# Patient Record
Sex: Female | Born: 1985 | Race: Black or African American | Hispanic: No | Marital: Single | Smoking: Never smoker
Health system: Southern US, Community
[De-identification: ages and names within clinical notes are randomized; demographics above are authoritative.]

## PROBLEM LIST (undated history)

## (undated) ENCOUNTER — Inpatient Hospital Stay (HOSPITAL_COMMUNITY): Payer: Self-pay

## (undated) DIAGNOSIS — F419 Anxiety disorder, unspecified: Secondary | ICD-10-CM

## (undated) DIAGNOSIS — Z789 Other specified health status: Secondary | ICD-10-CM

## (undated) DIAGNOSIS — R739 Hyperglycemia, unspecified: Secondary | ICD-10-CM

## (undated) DIAGNOSIS — M3214 Glomerular disease in systemic lupus erythematosus: Secondary | ICD-10-CM

## (undated) DIAGNOSIS — M329 Systemic lupus erythematosus, unspecified: Secondary | ICD-10-CM

## (undated) DIAGNOSIS — D689 Coagulation defect, unspecified: Secondary | ICD-10-CM

## (undated) DIAGNOSIS — N200 Calculus of kidney: Secondary | ICD-10-CM

## (undated) DIAGNOSIS — Z5189 Encounter for other specified aftercare: Secondary | ICD-10-CM

## (undated) DIAGNOSIS — J45909 Unspecified asthma, uncomplicated: Secondary | ICD-10-CM

## (undated) DIAGNOSIS — N83209 Unspecified ovarian cyst, unspecified side: Secondary | ICD-10-CM

## (undated) DIAGNOSIS — F3281 Premenstrual dysphoric disorder: Secondary | ICD-10-CM

## (undated) DIAGNOSIS — T7840XA Allergy, unspecified, initial encounter: Secondary | ICD-10-CM

## (undated) DIAGNOSIS — IMO0002 Reserved for concepts with insufficient information to code with codable children: Secondary | ICD-10-CM

## (undated) HISTORY — PX: PILONIDAL CYST DRAINAGE: SHX743

## (undated) HISTORY — DX: Premenstrual dysphoric disorder: F32.81

## (undated) HISTORY — DX: Encounter for other specified aftercare: Z51.89

## (undated) HISTORY — DX: Allergy, unspecified, initial encounter: T78.40XA

## (undated) HISTORY — DX: Anxiety disorder, unspecified: F41.9

## (undated) HISTORY — DX: Coagulation defect, unspecified: D68.9

---
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
## 2010-07-10 ENCOUNTER — Emergency Department (HOSPITAL_COMMUNITY): Admission: EM | Admit: 2010-07-10 | Discharge: 2010-07-10 | Payer: Self-pay | Admitting: Emergency Medicine

## 2010-11-10 LAB — URINALYSIS, ROUTINE W REFLEX MICROSCOPIC
Glucose, UA: NEGATIVE mg/dL
Hgb urine dipstick: NEGATIVE
Specific Gravity, Urine: 1.025 (ref 1.005–1.030)

## 2010-11-10 LAB — POCT PREGNANCY, URINE: Preg Test, Ur: NEGATIVE

## 2011-06-30 ENCOUNTER — Other Ambulatory Visit: Payer: Self-pay

## 2011-06-30 ENCOUNTER — Other Ambulatory Visit (HOSPITAL_COMMUNITY): Payer: Self-pay | Admitting: Obstetrics and Gynecology

## 2011-06-30 DIAGNOSIS — O2690 Pregnancy related conditions, unspecified, unspecified trimester: Secondary | ICD-10-CM

## 2011-07-06 ENCOUNTER — Encounter (HOSPITAL_COMMUNITY): Payer: Self-pay

## 2011-07-06 ENCOUNTER — Ambulatory Visit (HOSPITAL_COMMUNITY)
Admission: RE | Admit: 2011-07-06 | Discharge: 2011-07-06 | Disposition: A | Discharge status: Home / Self Care | Payer: Medicaid Other | Source: Ambulatory Visit | Attending: Obstetrics and Gynecology | Admitting: Obstetrics and Gynecology

## 2011-07-06 DIAGNOSIS — Z8279 Family history of other congenital malformations, deformations and chromosomal abnormalities: Secondary | ICD-10-CM

## 2011-07-06 DIAGNOSIS — Z1389 Encounter for screening for other disorder: Secondary | ICD-10-CM | POA: Insufficient documentation

## 2011-07-06 DIAGNOSIS — O358XX0 Maternal care for other (suspected) fetal abnormality and damage, not applicable or unspecified: Secondary | ICD-10-CM | POA: Insufficient documentation

## 2011-07-06 DIAGNOSIS — Z363 Encounter for antenatal screening for malformations: Secondary | ICD-10-CM | POA: Insufficient documentation

## 2011-07-06 DIAGNOSIS — O34219 Maternal care for unspecified type scar from previous cesarean delivery: Secondary | ICD-10-CM | POA: Insufficient documentation

## 2011-07-06 DIAGNOSIS — O2690 Pregnancy related conditions, unspecified, unspecified trimester: Secondary | ICD-10-CM

## 2011-07-06 HISTORY — DX: Systemic lupus erythematosus, unspecified: M32.9

## 2011-07-06 HISTORY — DX: Reserved for concepts with insufficient information to code with codable children: IMO0002

## 2011-07-06 NOTE — Progress Notes (Signed)
Ultrasound in [facility]/EP[facility].  Follow up U/S scheduled 

## 2011-07-08 NOTE — Progress Notes (Signed)
Genetic Counseling  High-Risk Gestation Note  Appointment Date:  07/06/11 Referred By: Juanetta Snow., MD Date of Birth:  1986-01-16   Pregnancy History: [id number] Estimated Date of Delivery: 11/25/11 Estimated Gestational Age: [redacted]w[redacted]d Attending: Dario Ave, MD  Ms. Trudie Cervantes was seen for genetic counseling because of a family history of spina bifida.  Both family histories were reviewed and found to be contributory for spina bifida. Ms. Fanguy reported that her daughter, currently age 23 years, had recurrent urinary tract infections and was subsequently diagnosed with closed spina bifida described as 3 unfused vertebra.  She is followed at [facility]. The patient reported that she was told that it is not considered spina bifida occulta given that her daughter has UTI's. Ms. Scovell also reported a maternal half-sister who had open spina bifida and was in a wheelchair. She passed away in adolescence. The patient had limited information regarding the specific cause.   We discussed that spina bifida is a common birth differences and affects approximately 1 in [phone] live births.  We reviewed that the spine contains nerves that control the legs, bladder, and bowel.  If there is an opening or a change in the development of the spine, the nerves can be damaged or incompletely formed resulting in a wide range of health concerns. Neural tube defects (NTDs) occurs as an isolated finding, in the majority of cases, with multifactorial inheritance typically suspected.   Both the genetic and environmental factors that contribute to the development of spina bifida are largely unknown; however, some medications and health conditions, such as uncontrolled diabetes and obesity, may increase the chance of spina bifida.  We also discussed the role of folic acid in the development of the neural tube and prevention of spina bifida. Additionally, NTDs may occur as a feature of an underlying genetic  syndrome or condition.  Approximately 5-10% of individuals who have spina bifida also have an underlying chromosome condition.  We reviewed chromosomes, genes, and various inheritance patterns.  Without additional information, it is difficult to determine a specific etiology.    Spina bifida occulta, or closed spina bifida, often describes an absence of one or two vertebral arches, without a visible lesion, which may be discovered incidentally following an X-ray for backache or other unrelated symptoms. Some forms of spina bifida occulta can cause symptoms, such as the presence of a tethered cord. Limited information exists regarding the prevalence and underlying cause of spina bifida occulta. Studies suggest that in cases of asymptomatic spina bifida occulta, recurrence risk to relatives is not likely increased. In the case of symptomatic spina bifida occulta, recurrence risk for spina bifida may be similar to that of open neural tube defects. Once a couple has one child with a nonsyndromic, multifactorial NTD, the risk of recurrence is estimated to be 3-5%. In the case of a third degree relative to the pregnancy with isolated spina bifida and assuming multifactorial inheritance, recurrence risk is approximately 0.5-1%.  In the case of an underlying condition, recurrence risk depends upon the inheritance of that condition. Current data are not available to quantify precise recurrence risk in the case of multiple relatives with spina bifida.   Further genetic counseling is warranted if more information is obtained regarding these individuals.   Rebecca Day was counseled regarding prenatal screening and diagnostic options for detection of NTDs.  We discussed that screening options adjust a patient's a priori risk and provide a pregnancy specific risk for the condition.  Specifically, we reviewed MSAFP  and ultrasound.  Rebecca Day previously had Quad screening which reduced the risk for open neural tube defects (1  in 5046). However, this screen was not able to incorporate the family history information into the patient's risk assessment. Additionally, Quad screening reduced the risk for Down syndrome to 1 in 9301 and indicated that the risk for Trisomy 18 was not increased above 1 in 4262. Rebecca Day understands that this screen adjusts a priori risk for these conditions to provide a pregnancy risk assessment and is not diagnostic for these conditions. We reviewed that detailed ultrasound detects between 80 and 90% of open NTDs.  In addition, we discussed the option of amniocentesis including the associated risks, benefits, and limitations.  She understands that amniotic fluid levels of AFP and ACHE can detect >99% of ONTDs. Ms. Graham understands that these screening and testing options cannot assess for closed spina bifida prenatally.   Rebecca Day elected to proceed with targeted ultrasound only.   Additionally, Rebecca Day reported a paternal half-niece (her paternal half-sister's daughter) who was born at [redacted] weeks gestation and had a hole in her heart requiring surgical correction. She is otherwise healthy at age 23 years. Congenital heart defects occur in approximately 1% of pregnancies.  Congenital heart defects may occur due to multifactorial influences, chromosomal abnormalities, genetic syndromes or environmental exposures.  Isolated heart defects are generally multifactorial.  Given the reported family history and assuming multifactorial inheritance, the risk for a congenital heart defect in the current pregnancy does not appear to be increased above the general population risk. Without further information regarding the provided family history, an accurate genetic risk cannot be calculated. Further genetic counseling is warranted if more information is obtained.  A complete ultrasound was performed today.  The ultrasound report will be sent under separate cover. An echogenic intracardiac focus was visualized today  on ultrasound. An isolated echogenic focus is generally believed to be a normal variation without any concerns for the pregnancy.  Isolated echogenic cardiac foci are not associated with congenital heart defects in the baby or compromised cardiac function after birth.  However, an echogenic cardiac focus is associated with a slightly increased chance for Down syndrome when seen in a high risk population. Given the patient's age and Quad screen result, the presence of an echogenic intracardiac focus on ultrasound would not be expected to significantly increase the risk for aneuploidy in the current pregnancy.   Rebecca Day was provided with written information regarding sickle cell anemia (SCA) including the carrier frequency and incidence in the African-American population, the availability of carrier testing and prenatal diagnosis if indicated.  In addition, we discussed that hemoglobinopathies are routinely screened for as part of the [place] newborn screening panel.  She declined hemoglobin electrophoresis today stating that she was screened in the past and does not have sickle cell trait.   Rebecca Day denied exposure to environmental toxins or chemical agents. She denied the use of alcohol, tobacco or street drugs. She denied significant viral illnesses during the course of her pregnancy. Her medical and surgical histories were contributory for Lupus, for which she reported she is not taking medication currently.   I counseled Rebecca Day for approximately 45 minutes regarding the above risks and available options.     Clydie Braun Onyx Edgley, MS, [facility] 07/08/2011

## 2011-07-08 NOTE — Progress Notes (Signed)
Encounter addended by: Quinn Plowman on: 07/08/2011  4:12 PM<BR>     Documentation filed: Chief Complaint Section, Notes Section, Letters

## 2011-07-27 ENCOUNTER — Ambulatory Visit (HOSPITAL_COMMUNITY): Payer: Medicaid Other | Attending: Obstetrics and Gynecology

## 2011-08-18 ENCOUNTER — Other Ambulatory Visit (HOSPITAL_COMMUNITY): Payer: Self-pay | Admitting: Obstetrics and Gynecology

## 2011-08-18 DIAGNOSIS — O2690 Pregnancy related conditions, unspecified, unspecified trimester: Secondary | ICD-10-CM

## 2011-09-01 ENCOUNTER — Ambulatory Visit (HOSPITAL_COMMUNITY)
Admission: RE | Admit: 2011-09-01 | Discharge: 2011-09-01 | Disposition: A | Discharge status: Home / Self Care | Payer: Medicaid Other | Source: Ambulatory Visit | Attending: Obstetrics and Gynecology | Admitting: Obstetrics and Gynecology

## 2011-09-01 DIAGNOSIS — O34219 Maternal care for unspecified type scar from previous cesarean delivery: Secondary | ICD-10-CM | POA: Insufficient documentation

## 2011-09-01 DIAGNOSIS — O2690 Pregnancy related conditions, unspecified, unspecified trimester: Secondary | ICD-10-CM

## 2011-09-01 DIAGNOSIS — Z3689 Encounter for other specified antenatal screening: Secondary | ICD-10-CM | POA: Insufficient documentation

## 2011-09-01 NOTE — Progress Notes (Signed)
Rebecca Day was seen for ultrasound appointment today.  Please see [facility] report for details.

## 2013-08-28 ENCOUNTER — Emergency Department (HOSPITAL_COMMUNITY)
Admission: EM | Admit: 2013-08-28 | Discharge: 2013-08-29 | Disposition: A | Discharge status: Home / Self Care | Payer: Medicaid Other | Attending: Emergency Medicine | Admitting: Emergency Medicine

## 2013-08-28 ENCOUNTER — Encounter (HOSPITAL_COMMUNITY): Payer: Self-pay | Admitting: Emergency Medicine

## 2013-08-28 DIAGNOSIS — S058X9A Other injuries of unspecified eye and orbit, initial encounter: Secondary | ICD-10-CM | POA: Insufficient documentation

## 2013-08-28 DIAGNOSIS — X58XXXA Exposure to other specified factors, initial encounter: Secondary | ICD-10-CM | POA: Insufficient documentation

## 2013-08-28 DIAGNOSIS — Z79899 Other long term (current) drug therapy: Secondary | ICD-10-CM | POA: Insufficient documentation

## 2013-08-28 DIAGNOSIS — Y939 Activity, unspecified: Secondary | ICD-10-CM | POA: Insufficient documentation

## 2013-08-28 DIAGNOSIS — J45909 Unspecified asthma, uncomplicated: Secondary | ICD-10-CM | POA: Insufficient documentation

## 2013-08-28 DIAGNOSIS — S0502XA Injury of conjunctiva and corneal abrasion without foreign body, left eye, initial encounter: Secondary | ICD-10-CM

## 2013-08-28 DIAGNOSIS — Z880 Allergy status to penicillin: Secondary | ICD-10-CM | POA: Insufficient documentation

## 2013-08-28 DIAGNOSIS — Z8739 Personal history of other diseases of the musculoskeletal system and connective tissue: Secondary | ICD-10-CM | POA: Insufficient documentation

## 2013-08-28 DIAGNOSIS — Y929 Unspecified place or not applicable: Secondary | ICD-10-CM | POA: Insufficient documentation

## 2013-08-28 HISTORY — DX: Unspecified asthma, uncomplicated: J45.909

## 2013-08-28 NOTE — ED Notes (Signed)
Pt states her ;eft eye has been painful and red without drainage.  Pt states blurriness and photophobia.

## 2013-08-29 MED ORDER — HYDROCODONE-ACETAMINOPHEN 5-325 MG PO TABS: 1.0000 | ORAL_TABLET | ORAL | Status: DC | PRN

## 2013-08-29 MED ORDER — FLUORESCEIN SODIUM 1 MG OP STRP
1.0000 | ORAL_STRIP | Freq: Once | OPHTHALMIC | Status: AC
  Administered 2013-08-29: 1 via OPHTHALMIC
  Filled 2013-08-29: qty 1

## 2013-08-29 MED ORDER — TETRACAINE HCL 0.5 % OP SOLN
1.0000 [drp] | Freq: Once | OPHTHALMIC | Status: AC
  Administered 2013-08-29: 1 [drp] via OPHTHALMIC
  Filled 2013-08-29: qty 2

## 2013-08-29 MED ORDER — ERYTHROMYCIN 5 MG/GM OP OINT: TOPICAL_OINTMENT | OPHTHALMIC | Status: DC

## 2013-08-29 MED ORDER — PREDNISONE 5 MG PO TABS: 5.0000 mg | ORAL_TABLET | Freq: Two times a day (BID) | ORAL | Status: DC

## 2013-08-29 NOTE — ED Provider Notes (Signed)
Medical screening examination/treatment/procedure(s) were performed by non-physician practitioner and as supervising physician I was immediately available for consultation/collaboration.   Erskine Steinfeldt, MD 08/29/13 0829 

## 2013-08-29 NOTE — ED Provider Notes (Signed)
CSN: [id number]     Arrival date & time 08/28/13  2213 History   None    Chief Complaint  Patient presents with  . Eye Pain   (Consider location/radiation/quality/duration/timing/severity/associated sxs/prior Treatment) HPI History provided by pt.   Pt presents w/ severe, non-traumatic L eye pain.  Onset late yesterday am.  Constant but aggravated by bright lights and palpation.  Associated w/ foreign body sensation, lacrimation, a small amt of white drainage, blurred vision and nausea.  Denies floaters.  Does not wear corrective lenses.  FH unknown.  No prior personal h/o same.   Also c/o L-sided headache, that started ~2d prior to eye symptoms, as well as body aches and BLE rash.  Sx are consistent w/ lupus exacerbation.  Does not currently have a rheumatologist and has not been on steroids for a long time.     Past Medical History  Diagnosis Date  . Lupus     questionable, no meds or treatment per pt  . Asthma    Past Surgical History  Procedure Laterality Date  . Cesarean section     History reviewed. No pertinent family history. History  Substance Use Topics  . Smoking status: Never Smoker   . Smokeless tobacco: Not on file  . Alcohol Use: Yes     Comment: socially   OB History   Grav Para Term Preterm Abortions TAB SAB Ect Mult Living   3 1 1  0 1 0 1 0 0 1     Review of Systems  All other systems reviewed and are negative.    Allergies  Penicillins  Home Medications   Current Outpatient Rx  Name  Route  Sig  Dispense  Refill  . B Complex Vitamins (VITAMIN-B COMPLEX PO)   Oral   Take 1 tablet by mouth daily.         Marland Kitchen BIOTIN PO   Oral   Take 1 tablet by mouth daily.         Marland Kitchen ibuprofen (ADVIL,MOTRIN) 200 MG tablet   Oral   Take 400 mg by mouth every 6 (six) hours as needed for headache or moderate pain.          BP 112/63  Pulse 80  Temp(Src) 97.9 F (36.6 C) (Oral)  Resp 16  Ht 5\' 8"  (1.727 m)  Wt 260 lb 4 oz (118.049 kg)  BMI 39.58 kg/m2   SpO2 99%  LMP 08/18/2013  Breastfeeding? Unknown Physical Exam  Nursing note and vitals reviewed. Constitutional: She is oriented to person, place, and time. She appears well-developed and well-nourished. No distress.  HENT:  Head: Normocephalic and atraumatic.  Eyes:  Lacrimation and diffuse, mild conjunctival injection L eye.  Photophobia.  Globe ttp.  No periorbital edema, erythema or tenderness.  PERRL.  EOMi.  Eye stained w/ fluorescein and visualized w/ black light.  There is a large corneal abrasion at 9:00, just outside the pupil.  Nml IOP when checked w/ tonopen.  Eye pain improved greatly w/ 1 drop tetracaine.   Neck: Normal range of motion.  No meningeal signs  Pulmonary/Chest: Effort normal.  Musculoskeletal: Normal range of motion.  Neurological: She is alert and oriented to person, place, and time.  CN 3-12 intact.  No sensory deficits.  5/5 and equal upper and lower extremity strength.  No past pointing.   Skin:  Razor burn left lower leg.  Pt reports that she usually uses Darene Lamer but shaved for the first time in several weeks today.  No other rash.  Psychiatric: She has a normal mood and affect. Her behavior is normal.    ED Course  Procedures (including critical care time) Labs Review Labs Reviewed - No data to display Imaging Review No results found.  EKG Interpretation   None       MDM   1. Corneal abrasion, left, initial encounter    27yo F presents w/ multiple complaints.  Has had non-traumatic L eye pain, photophobia and visual impairment since this morning.  Exam most consistent w/ corneal abrasion.  Nml IOP.  Visual acuity 20/30 on L.  Prescribed erythromycin ointment and vicodin and I recommended artificial tears as well.  Referred to ophtho for persistent sx and Strict return precautions discussed.  Also c/o BLE rash x 2x3d as well as body aches and headache.  Sx consistent w/ lupus exacerbation.  Does not currently have a rheumatologist and has been out  of her prednisone for a long time.  Afebrile, non-toxic appearing, no focal neuro deficits, no meningeal signs and no rash other than mild razor burn R lower leg on exam.  Prescribed low dose daily prednisone and referred to rheumatology.     Otilio Miu, PA-C 08/29/13 [phone]

## 2014-03-08 ENCOUNTER — Emergency Department (HOSPITAL_COMMUNITY)
Admission: EM | Admit: 2014-03-08 | Discharge: 2014-03-08 | Disposition: A | Discharge status: Home / Self Care | Payer: Medicaid Other | Attending: Emergency Medicine | Admitting: Emergency Medicine

## 2014-03-08 ENCOUNTER — Encounter (HOSPITAL_COMMUNITY): Payer: Self-pay | Admitting: Emergency Medicine

## 2014-03-08 DIAGNOSIS — Z23 Encounter for immunization: Secondary | ICD-10-CM | POA: Diagnosis not present

## 2014-03-08 DIAGNOSIS — H5710 Ocular pain, unspecified eye: Secondary | ICD-10-CM | POA: Diagnosis present

## 2014-03-08 DIAGNOSIS — M329 Systemic lupus erythematosus, unspecified: Secondary | ICD-10-CM | POA: Insufficient documentation

## 2014-03-08 DIAGNOSIS — IMO0002 Reserved for concepts with insufficient information to code with codable children: Secondary | ICD-10-CM | POA: Diagnosis not present

## 2014-03-08 DIAGNOSIS — R300 Dysuria: Secondary | ICD-10-CM | POA: Insufficient documentation

## 2014-03-08 DIAGNOSIS — J45909 Unspecified asthma, uncomplicated: Secondary | ICD-10-CM | POA: Diagnosis not present

## 2014-03-08 DIAGNOSIS — Z79899 Other long term (current) drug therapy: Secondary | ICD-10-CM | POA: Insufficient documentation

## 2014-03-08 DIAGNOSIS — G8911 Acute pain due to trauma: Secondary | ICD-10-CM | POA: Insufficient documentation

## 2014-03-08 DIAGNOSIS — Z880 Allergy status to penicillin: Secondary | ICD-10-CM | POA: Insufficient documentation

## 2014-03-08 DIAGNOSIS — S0502XA Injury of conjunctiva and corneal abrasion without foreign body, left eye, initial encounter: Secondary | ICD-10-CM

## 2014-03-08 MED ORDER — TETANUS-DIPHTH-ACELL PERTUSSIS 5-2.5-18.5 LF-MCG/0.5 IM SUSP
0.5000 mL | Freq: Once | INTRAMUSCULAR | Status: AC
  Administered 2014-03-08: 0.5 mL via INTRAMUSCULAR
  Filled 2014-03-08: qty 0.5

## 2014-03-08 MED ORDER — CIPROFLOXACIN HCL 0.3 % OP SOLN: 1.0000 [drp] | Freq: Four times a day (QID) | OPHTHALMIC | Status: DC

## 2014-03-08 MED ORDER — TETRACAINE HCL 0.5 % OP SOLN
2.0000 [drp] | Freq: Once | OPHTHALMIC | Status: AC
  Administered 2014-03-08: 2 [drp] via OPHTHALMIC
  Filled 2014-03-08: qty 2

## 2014-03-08 MED ORDER — OXYCODONE-ACETAMINOPHEN 5-325 MG PO TABS: 1.0000 | ORAL_TABLET | ORAL | Status: DC | PRN

## 2014-03-08 MED ORDER — FLUORESCEIN SODIUM 1 MG OP STRP
1.0000 | ORAL_STRIP | Freq: Once | OPHTHALMIC | Status: DC
  Filled 2014-03-08: qty 1

## 2014-03-08 NOTE — ED Provider Notes (Signed)
CSN: [id number]     Arrival date & time 03/08/14  1650 History      Chief Complaint  Patient presents with  . Eye Pain   Patient is a 28 y.o. female presenting with eye pain. The history is provided by the patient. No language interpreter was used.  Eye Pain  Eye Pain   HPI Comments: Pt with hx lupus and possible sjogren's syndrome presents with left eye discomfort x 3 days.  Reports pain feels like a gritty sensation, as if there is a rock in her eye. Has hx of corneal abrasion x 2 this year and this feels similar.  Was seen at an outside hospital yesterday where she states no exam was done and no medications given.  Reports today her vision in that eye became blurry and a very few dark spots in her eye.  Pain is 8/10.  Worse with light.  Has watery discharge.  No difficulty moving eye.  No fever. No URI symptoms.  Pt does not wear contacts lenses.     Past Medical History  Diagnosis Date  . Lupus     questionable, no meds or treatment per pt  . Asthma    Past Surgical History  Procedure Laterality Date  . Cesarean section     No family history on file. History  Substance Use Topics  . Smoking status: Never Smoker   . Smokeless tobacco: Not on file  . Alcohol Use: Yes     Comment: socially   OB History   Grav Para Term Preterm Abortions TAB SAB Ect Mult Living   3 1 1  0 1 0 1 0 0 1     Review of Systems  Eyes: Positive for pain.  Genitourinary: Positive for dysuria (Has current UTI, on abx).  All other systems reviewed and are negative.     Allergies  Penicillins  Home Medications   Prior to Admission medications   Medication Sig Start Date End Date Taking? Authorizing Provider  B Complex Vitamins (VITAMIN-B COMPLEX PO) Take 1 tablet by mouth daily.    Historical Provider, MD  BIOTIN PO Take 1 tablet by mouth daily.    Historical Provider, MD  erythromycin ophthalmic ointment Place a 1/2 inch ribbon of ointment into the lower eyelid x 7 days 08/29/13    Arville Lime Schinlever, PA-C  HYDROcodone-acetaminophen (NORCO/VICODIN) 5-325 MG per tablet Take 1 tablet by mouth every 4 (four) hours as needed for moderate pain. 08/29/13   Arville Lime Schinlever, PA-C  ibuprofen (ADVIL,MOTRIN) 200 MG tablet Take 400 mg by mouth every 6 (six) hours as needed for headache or moderate pain.    Historical Provider, MD  predniSONE (DELTASONE) 5 MG tablet Take 1 tablet (5 mg total) by mouth 2 (two) times daily. 08/29/13   Arville Lime Schinlever, PA-C   Triage Vitals: BP 132/68  Pulse 96  Temp(Src) 97.9 F (36.6 C) (Oral)  Resp 18  SpO2 97%  LMP 02/13/2014  Physical Exam  Nursing note and vitals reviewed. Constitutional: She appears well-developed and well-nourished. No distress.  HENT:  Head: Normocephalic and atraumatic.  Eyes: EOM and lids are normal. Pupils are equal, round, and reactive to light. Lids are everted and swept, no foreign bodies found. Right eye exhibits no discharge. Left eye exhibits no discharge. Left conjunctiva is injected. No scleral icterus.  Slit lamp exam:      The left eye shows corneal abrasion.    Photophobia but no consensual photophobia  Neck: Neck supple.  Pulmonary/Chest: Effort normal.  Neurological: She is alert.  Skin: She is not diaphoretic.    ED Course  Procedures (including critical care time) DIAGNOSTIC STUDIES: Oxygen Saturation is 97% on RA, normal by my interpretation.    COORDINATION OF CARE: 5:59 PM- pt not in room    Labs Review Labs Reviewed - No data to display  Imaging Review No results found.   EKG Interpretation None      MDM   Final diagnoses:  Corneal abrasion, left, initial encounter  Lupus    Pt with hx lupus and possible sjogren's syndrome with several eye problems this year, diagnosed in EDs as corneal abrasions.  Pt does appear to have corneal abrasion.  No FB.  I have strongly advised her to follow up with ophthalmology.  Pt d/c home with cipro drops and percocet.   Discussed result, findings, treatment, and follow up  with patient.  Pt given return precautions.  Pt verbalizes understanding and agrees with plan.             Clayton Bibles, PA-C 03/08/14 1907

## 2014-03-08 NOTE — ED Notes (Signed)
Declined W/C at D/C and was escorted to lobby by RN. 

## 2014-03-08 NOTE — Discharge Instructions (Signed)
Read the information below.  Use the prescribed medication as directed.  Please discuss all new medications with your pharmacist.  Do not take additional tylenol while taking the prescribed pain medication to avoid overdose.  You may return to the Emergency Department at any time for worsening condition or any new symptoms that concern you.   If you develop worsening pain in your eye, change in your vision, swelling around your eye, difficulty moving your eye, or fevers greater than 100.4, see your eye doctor or return to the Emergency Department immediately for a recheck.    ° ° °Corneal Abrasion °The cornea is the clear covering at the front and center of the eye. When looking at the colored portion of the eye (iris), you are looking through the cornea. This very thin tissue is made up of many layers. The surface layer is a single layer of cells (corneal epithelium) and is one of the most sensitive tissues in the body. If a scratch or injury causes the corneal epithelium to come off, it is called a corneal abrasion. If the injury extends to the tissues below the epithelium, the condition is called a corneal ulcer. °CAUSES  °· Scratches. °· Trauma. °· Foreign body in the eye. °Some people have recurrences of abrasions in the area of the original injury even after it has healed (recurrent erosion syndrome). Recurrent erosion syndrome generally improves and goes away with time. °SYMPTOMS  °· Eye pain. °· Difficulty or inability to keep the injured eye open. °· The eye becomes very sensitive to light. °· Recurrent erosions tend to happen suddenly, first thing in the morning, usually after waking up and opening the eye. °DIAGNOSIS  °Your health care provider can diagnose a corneal abrasion during an eye exam. Dye is usually placed in the eye using a drop or a small paper strip moistened by your tears. When the eye is examined with a special light, the abrasion shows up clearly because of the dye. °TREATMENT  °· Small  abrasions may be treated with antibiotic drops or ointment alone. °· A pressure patch may be put over the eye. If this is done, follow your doctor's instructions for when to remove the patch. Do not drive or use machines while the eye patch is on. Judging distances is hard to do with a patch on. °If the abrasion becomes infected and spreads to the deeper tissues of the cornea, a corneal ulcer can result. This is serious because it can cause corneal scarring. Corneal scars interfere with light passing through the cornea and cause a loss of vision in the involved eye. °HOME CARE INSTRUCTIONS °· Use medicine or ointment as directed. Only take over-the-counter or prescription medicines for pain, discomfort, or fever as directed by your health care provider. °· Do not drive or operate machinery if your eye is patched. Your ability to judge distances is impaired. °· If your health care provider has given you a follow-up appointment, it is very important to keep that appointment. Not keeping the appointment could result in a severe eye infection or permanent loss of vision. If there is any problem keeping the appointment, let your health care provider know. °SEEK MEDICAL CARE IF:  °· You have pain, light sensitivity, and a scratchy feeling in one eye or both eyes. °· Your pressure patch keeps loosening up, and you can blink your eye under the patch after treatment. °· Any kind of discharge develops from the eye after treatment or if the lids stick together   in the morning. °· You have the same symptoms in the morning as you did with the original abrasion days, weeks, or months after the abrasion healed. °MAKE SURE YOU:  °· Understand these instructions. °· Will watch your condition. °· Will get help right away if you are not doing well or get worse. °Document Released: 08/13/2000 Document Revised: 08/21/2013 Document Reviewed: 04/23/2013 °[employer]® Patient Information ©2015 [employer]. This information is not intended to  replace advice given to you by your health care provider. Make sure you discuss any questions you have with your health care provider. ° °

## 2014-03-08 NOTE — ED Notes (Signed)
Pt has had corneal abrasion to left eye twice since December.  Pt has Lupus and they stated she is trying to develop Raynaud's and told her this may be related.  Pt has redness to sclera, sensitive to light.  Blurry vision and black dots

## 2014-03-09 NOTE — ED Provider Notes (Signed)
Medical screening examination/treatment/procedure(s) were performed by non-physician practitioner and as supervising physician I was immediately available for consultation/collaboration.   EKG Interpretation None        Ezequiel Essex, MD 03/09/14 [phone]

## 2014-03-15 ENCOUNTER — Emergency Department (HOSPITAL_COMMUNITY)
Admission: EM | Admit: 2014-03-15 | Discharge: 2014-03-15 | Disposition: A | Discharge status: Home / Self Care | Payer: Medicaid Other | Attending: Emergency Medicine | Admitting: Emergency Medicine

## 2014-03-15 ENCOUNTER — Encounter (HOSPITAL_COMMUNITY): Payer: Self-pay | Admitting: Emergency Medicine

## 2014-03-15 DIAGNOSIS — X58XXXA Exposure to other specified factors, initial encounter: Secondary | ICD-10-CM | POA: Diagnosis not present

## 2014-03-15 DIAGNOSIS — Z792 Long term (current) use of antibiotics: Secondary | ICD-10-CM | POA: Diagnosis not present

## 2014-03-15 DIAGNOSIS — Z880 Allergy status to penicillin: Secondary | ICD-10-CM | POA: Insufficient documentation

## 2014-03-15 DIAGNOSIS — Y929 Unspecified place or not applicable: Secondary | ICD-10-CM | POA: Insufficient documentation

## 2014-03-15 DIAGNOSIS — J45909 Unspecified asthma, uncomplicated: Secondary | ICD-10-CM | POA: Insufficient documentation

## 2014-03-15 DIAGNOSIS — R112 Nausea with vomiting, unspecified: Secondary | ICD-10-CM | POA: Diagnosis not present

## 2014-03-15 DIAGNOSIS — H5789 Other specified disorders of eye and adnexa: Secondary | ICD-10-CM | POA: Diagnosis present

## 2014-03-15 DIAGNOSIS — H53149 Visual discomfort, unspecified: Secondary | ICD-10-CM | POA: Diagnosis not present

## 2014-03-15 DIAGNOSIS — Y939 Activity, unspecified: Secondary | ICD-10-CM | POA: Diagnosis not present

## 2014-03-15 DIAGNOSIS — S058X9A Other injuries of unspecified eye and orbit, initial encounter: Secondary | ICD-10-CM | POA: Diagnosis not present

## 2014-03-15 DIAGNOSIS — Z8739 Personal history of other diseases of the musculoskeletal system and connective tissue: Secondary | ICD-10-CM | POA: Diagnosis not present

## 2014-03-15 DIAGNOSIS — S0502XA Injury of conjunctiva and corneal abrasion without foreign body, left eye, initial encounter: Secondary | ICD-10-CM

## 2014-03-15 MED ORDER — ERYTHROMYCIN 5 MG/GM OP OINT: 1.0000 "application " | TOPICAL_OINTMENT | Freq: Four times a day (QID) | OPHTHALMIC | Status: DC

## 2014-03-15 MED ORDER — TETRACAINE HCL 0.5 % OP SOLN
1.0000 [drp] | Freq: Once | OPHTHALMIC | Status: AC
  Administered 2014-03-15: 1 [drp] via OPHTHALMIC
  Filled 2014-03-15: qty 2

## 2014-03-15 MED ORDER — PREDNISONE 20 MG PO TABS: ORAL_TABLET | ORAL | Status: DC

## 2014-03-15 MED ORDER — FLUORESCEIN SODIUM 1 MG OP STRP
1.0000 | ORAL_STRIP | Freq: Once | OPHTHALMIC | Status: AC
  Administered 2014-03-15: 1 via OPHTHALMIC
  Filled 2014-03-15: qty 1

## 2014-03-15 NOTE — ED Notes (Signed)
Pt reports was here last week with corneal abrasion, she started to get better, but this morning she woke up with L eye pain and blurry vision

## 2014-03-15 NOTE — Discharge Instructions (Signed)
Corneal Abrasion °The cornea is the clear covering at the front and center of the eye. When looking at the colored portion of the eye (iris), you are looking through the cornea. This very thin tissue is made up of many layers. The surface layer is a single layer of cells (corneal epithelium) and is one of the most sensitive tissues in the body. If a scratch or injury causes the corneal epithelium to come off, it is called a corneal abrasion. If the injury extends to the tissues below the epithelium, the condition is called a corneal ulcer. °CAUSES  °· Scratches. °· Trauma. °· Foreign body in the eye. °Some people have recurrences of abrasions in the area of the original injury even after it has healed (recurrent erosion syndrome). Recurrent erosion syndrome generally improves and goes away with time. °SYMPTOMS  °· Eye pain. °· Difficulty or inability to keep the injured eye open. °· The eye becomes very sensitive to light. °· Recurrent erosions tend to happen suddenly, first thing in the morning, usually after waking up and opening the eye. °DIAGNOSIS  °Your health care provider can diagnose a corneal abrasion during an eye exam. Dye is usually placed in the eye using a drop or a small paper strip moistened by your tears. When the eye is examined with a special light, the abrasion shows up clearly because of the dye. °TREATMENT  °· Small abrasions may be treated with antibiotic drops or ointment alone. °· A pressure patch may be put over the eye. If this is done, follow your doctor's instructions for when to remove the patch. Do not drive or use machines while the eye patch is on. Judging distances is hard to do with a patch on. °If the abrasion becomes infected and spreads to the deeper tissues of the cornea, a corneal ulcer can result. This is serious because it can cause corneal scarring. Corneal scars interfere with light passing through the cornea and cause a loss of vision in the involved eye. °HOME CARE  INSTRUCTIONS °· Use medicine or ointment as directed. Only take over-the-counter or prescription medicines for pain, discomfort, or fever as directed by your health care provider. °· Do not drive or operate machinery if your eye is patched. Your ability to judge distances is impaired. °· If your health care provider has given you a follow-up appointment, it is very important to keep that appointment. Not keeping the appointment could result in a severe eye infection or permanent loss of vision. If there is any problem keeping the appointment, let your health care provider know. °SEEK MEDICAL CARE IF:  °· You have pain, light sensitivity, and a scratchy feeling in one eye or both eyes. °· Your pressure patch keeps loosening up, and you can blink your eye under the patch after treatment. °· Any kind of discharge develops from the eye after treatment or if the lids stick together in the morning. °· You have the same symptoms in the morning as you did with the original abrasion days, weeks, or months after the abrasion healed. °MAKE SURE YOU:  °· Understand these instructions. °· Will watch your condition. °· Will get help right away if you are not doing well or get worse. °Document Released: 08/13/2000 Document Revised: 08/21/2013 Document Reviewed: 04/23/2013 °[employer]® Patient Information ©2015 [employer]. This information is not intended to replace advice given to you by your health care provider. Make sure you discuss any questions you have with your health care provider. ° °

## 2014-03-15 NOTE — ED Provider Notes (Signed)
CSN: [id number]     Arrival date & time 03/15/14  0144 History   First MD Initiated Contact with Patient 03/15/14 [phone]     Chief Complaint  Patient presents with  . Eye Problem     (Consider location/radiation/quality/duration/timing/severity/associated sxs/prior Treatment) HPI Comments: Patient is a 28 year old female with history of lupus and asthma who presents today with recurrent red eye. She states that she was seen on 7/10 in diagnosis corneal abrasion. She initially was improving with Cipro drops, but yesterday morning woke up with left eye pain and worsening blurry vision. She has an ophthalmologist, but states that she is in [place] counties on [place] and was told to call back in one week. She has associated nausea and vomiting, but states this is due to the amount of pain that she is in. She does not wear contact lenses. She denies any difficulty moving her eye. No fever, chills, cough, congestion. The patient is not currently on any treatment for her lupus.  Patient is a 28 y.o. female presenting with eye problem. The history is provided by the patient. No language interpreter was used.  Eye Problem Associated symptoms: discharge, nausea, photophobia, redness and vomiting     Past Medical History  Diagnosis Date  . Lupus     questionable, no meds or treatment per pt  . Asthma    Past Surgical History  Procedure Laterality Date  . Cesarean section     History reviewed. No pertinent family history. History  Substance Use Topics  . Smoking status: Never Smoker   . Smokeless tobacco: Not on file  . Alcohol Use: Yes     Comment: socially   OB History   Grav Para Term Preterm Abortions TAB SAB Ect Mult Living   3 1 1  0 1 0 1 0 0 1     Review of Systems  Constitutional: Negative for fever and chills.  Eyes: Positive for photophobia, pain, discharge, redness and visual disturbance.  Respiratory: Negative for shortness of breath.   Cardiovascular: Negative for  chest pain.  Gastrointestinal: Positive for nausea and vomiting.  All other systems reviewed and are negative.     Allergies  Penicillins  Home Medications   Prior to Admission medications   Medication Sig Start Date End Date Taking? Authorizing Provider  ciprofloxacin (CILOXAN) 0.3 % ophthalmic solution Place 1-2 drops into the left eye 4 (four) times daily. X 5 days 03/08/14  Yes Clayton Bibles, PA-C  oxyCODONE-acetaminophen (PERCOCET/ROXICET) 5-325 MG per tablet Take 1-2 tablets by mouth every 4 (four) hours as needed for moderate pain or severe pain. 03/08/14  Yes Clayton Bibles, PA-C   BP 127/77  Pulse 64  Temp(Src) 97.7 F (36.5 C)  Resp 14  Ht 5\' 8"  (1.727 m)  Wt 255 lb 6 oz (115.837 kg)  BMI 38.84 kg/m2  SpO2 98%  LMP 03/10/2014 Physical Exam  Nursing note and vitals reviewed. Constitutional: She is oriented to person, place, and time. She appears well-developed and well-nourished. No distress.  HENT:  Head: Normocephalic and atraumatic.  Right Ear: External ear normal.  Left Ear: External ear normal.  Nose: Nose normal.  Mouth/Throat: Oropharynx is clear and moist.  Eyes: EOM and lids are normal. Pupils are equal, round, and reactive to light. Lids are everted and swept, no foreign bodies found. Left conjunctiva is injected. Left conjunctiva has no hemorrhage.  Slit lamp exam:      The left eye shows corneal abrasion and fluorescein uptake. The left  eye shows no corneal flare, no corneal ulcer, no foreign body, no hyphema and no hypopyon.  No pain with consensual light reflex.  Neck: Normal range of motion.  Cardiovascular: Normal rate, regular rhythm and normal heart sounds.   Pulmonary/Chest: Effort normal and breath sounds normal. No stridor. No respiratory distress. She has no wheezes. She has no rales.  Abdominal: Soft. She exhibits no distension.  Musculoskeletal: Normal range of motion.  Neurological: She is alert and oriented to person, place, and time. She has  normal strength.  Skin: Skin is warm and dry. She is not diaphoretic. No erythema.  Psychiatric: She has a normal mood and affect. Her behavior is normal.    ED Course  Procedures (including critical care time) Labs Review Labs Reviewed - No data to display  Imaging Review No results found.   EKG Interpretation None      MDM   Final diagnoses:  Corneal abrasion, left, initial encounter    Pt with corneal abrasion on PE. Tdap UTD. No evidence of FB.  Pt is not a contact lens wearer.  Exam non-concerning for orbital cellulitis, hyphema, corneal ulcers. Patient will be discharged home with erythromycin.   Patient was given opthalmology follow up and encouraged to call office today to be seen given recurrent nature of these abrasions.  She was instructed to return to ER if new symptoms develop including change in vision, purulent drainage, or entrapment.    Elwyn Lade, PA-C 03/18/14 1154

## 2014-03-20 NOTE — ED Provider Notes (Signed)
Medical screening examination/treatment/procedure(s) were performed by non-physician practitioner and as supervising physician I was immediately available for consultation/collaboration.   EKG Interpretation None       Kalman Drape, MD 03/20/14 1248

## 2014-04-25 ENCOUNTER — Emergency Department (HOSPITAL_BASED_OUTPATIENT_CLINIC_OR_DEPARTMENT_OTHER): Payer: Medicaid Other

## 2014-04-25 ENCOUNTER — Emergency Department (HOSPITAL_BASED_OUTPATIENT_CLINIC_OR_DEPARTMENT_OTHER)
Admission: EM | Admit: 2014-04-25 | Discharge: 2014-04-25 | Disposition: A | Discharge status: Home / Self Care | Payer: Medicaid Other | Attending: Emergency Medicine | Admitting: Emergency Medicine

## 2014-04-25 ENCOUNTER — Encounter (HOSPITAL_BASED_OUTPATIENT_CLINIC_OR_DEPARTMENT_OTHER): Payer: Self-pay | Admitting: Emergency Medicine

## 2014-04-25 DIAGNOSIS — R110 Nausea: Secondary | ICD-10-CM | POA: Diagnosis not present

## 2014-04-25 DIAGNOSIS — Z79899 Other long term (current) drug therapy: Secondary | ICD-10-CM | POA: Diagnosis not present

## 2014-04-25 DIAGNOSIS — R079 Chest pain, unspecified: Secondary | ICD-10-CM | POA: Diagnosis present

## 2014-04-25 DIAGNOSIS — Z880 Allergy status to penicillin: Secondary | ICD-10-CM | POA: Diagnosis not present

## 2014-04-25 DIAGNOSIS — IMO0002 Reserved for concepts with insufficient information to code with codable children: Secondary | ICD-10-CM | POA: Diagnosis not present

## 2014-04-25 DIAGNOSIS — J45909 Unspecified asthma, uncomplicated: Secondary | ICD-10-CM | POA: Diagnosis not present

## 2014-04-25 DIAGNOSIS — Z8739 Personal history of other diseases of the musculoskeletal system and connective tissue: Secondary | ICD-10-CM | POA: Insufficient documentation

## 2014-04-25 MED ORDER — KETOROLAC TROMETHAMINE 60 MG/2ML IM SOLN
60.0000 mg | Freq: Once | INTRAMUSCULAR | Status: AC
  Administered 2014-04-25: 60 mg via INTRAMUSCULAR
  Filled 2014-04-25: qty 2

## 2014-04-25 MED ORDER — NAPROXEN 375 MG PO TABS: 375.0000 mg | ORAL_TABLET | Freq: Two times a day (BID) | ORAL | Status: DC

## 2014-04-25 NOTE — ED Notes (Signed)
Pt states that she was recently tx to new pcp in [place], when questioned about what medications she takes for her lupus pt stated she has not been able to find a rheumatologist in this area since moving here from [place]

## 2014-04-25 NOTE — ED Notes (Signed)
Pt reports that she has had "weird feeling" in her chest for a while, but developed tightness yesterday

## 2014-04-25 NOTE — Discharge Instructions (Signed)

## 2014-04-25 NOTE — ED Notes (Signed)
MD at bedside. 

## 2014-04-25 NOTE — ED Provider Notes (Signed)
CSN: [id number]     Arrival date & time 04/25/14  8101 History   First MD Initiated Contact with Patient 04/25/14 [phone]     Chief Complaint  Patient presents with  . Chest Pain     (Consider location/radiation/quality/duration/timing/severity/associated sxs/prior Treatment) HPI Comments: Patient presents with chest pain. She describes it as a aching tightness in the center of her chest. It's not radiating. It's been constant since yesterday. It's nonexertional. It's nonpruritic. She has no shortness of breath or diaphoresis. She's had some nausea but no vomiting. She denies abdominal pain. She denies any past history of cardiac problems. There is no family history of early heart disease. She does state that she has a history of lupus but currently does not have a rheumatologist. She denies any swelling in her legs or pain in her legs. She denies any cough or chest congestion. The pain is not related to movement it is not related to eating.  Patient is a 28 y.o. female presenting with chest pain.  Chest Pain Associated symptoms: nausea   Associated symptoms: no abdominal pain, no back pain, no cough, no diaphoresis, no dizziness, no fatigue, no fever, no headache, no numbness, no shortness of breath, not vomiting and no weakness     Past Medical History  Diagnosis Date  . Lupus     questionable, no meds or treatment per pt  . Asthma    Past Surgical History  Procedure Laterality Date  . Cesarean section     History reviewed. No pertinent family history. History  Substance Use Topics  . Smoking status: Never Smoker   . Smokeless tobacco: Not on file  . Alcohol Use: Yes     Comment: socially   OB History   Grav Para Term Preterm Abortions TAB SAB Ect Mult Living   3 1 1  0 1 0 1 0 0 1     Review of Systems  Constitutional: Negative for fever, chills, diaphoresis and fatigue.  HENT: Negative for congestion, rhinorrhea and sneezing.   Eyes: Negative.   Respiratory: Negative for  cough, chest tightness and shortness of breath.   Cardiovascular: Positive for chest pain. Negative for leg swelling.  Gastrointestinal: Positive for nausea. Negative for vomiting, abdominal pain, diarrhea and blood in stool.  Genitourinary: Negative for frequency, hematuria, flank pain and difficulty urinating.  Musculoskeletal: Negative for arthralgias and back pain.  Skin: Negative for rash.  Neurological: Negative for dizziness, speech difficulty, weakness, numbness and headaches.      Allergies  Penicillins  Home Medications   Prior to Admission medications   Medication Sig Start Date End Date Taking? Authorizing Provider  ciprofloxacin (CILOXAN) 0.3 % ophthalmic solution Place 1-2 drops into the left eye 4 (four) times daily. X 5 days 03/08/14   Clayton Bibles, PA-C  erythromycin ophthalmic ointment Place 1 application into the left eye 4 (four) times daily. Place 1/2 inch ribbon of ointment in the affected eye 4 times a day 03/15/14   Elwyn Lade, PA-C  naproxen (NAPROSYN) 375 MG tablet Take 1 tablet (375 mg total) by mouth 2 (two) times daily. 04/25/14   Malvin Johns, MD  oxyCODONE-acetaminophen (PERCOCET/ROXICET) 5-325 MG per tablet Take 1-2 tablets by mouth every 4 (four) hours as needed for moderate pain or severe pain. 03/08/14   Clayton Bibles, PA-C  predniSONE (DELTASONE) 20 MG tablet 3 tabs po day one, then 2 po daily x 4 days 03/15/14   Elwyn Lade, PA-C   BP 122/83  Pulse  82  Temp(Src) 98.1 F (36.7 C) (Oral)  Resp 20  Ht 5\' 8"  (1.727 m)  Wt 250 lb (113.399 kg)  BMI 38.02 kg/m2  SpO2 99%  LMP 04/03/2014 Physical Exam  Constitutional: She is oriented to person, place, and time. She appears well-developed and well-nourished.  HENT:  Head: Normocephalic and atraumatic.  Eyes: Pupils are equal, round, and reactive to light.  Neck: Normal range of motion. Neck supple.  Cardiovascular: Normal rate, regular rhythm and normal heart sounds.   Pulmonary/Chest: Effort  normal and breath sounds normal. No respiratory distress. She has no wheezes. She has no rales. She exhibits tenderness (tenderness across the sternum).  Abdominal: Soft. Bowel sounds are normal. There is no tenderness. There is no rebound and no guarding.  Musculoskeletal: Normal range of motion. She exhibits no edema.  Lymphadenopathy:    She has no cervical adenopathy.  Neurological: She is alert and oriented to person, place, and time.  Skin: Skin is warm and dry. No rash noted.  Psychiatric: She has a normal mood and affect.    ED Course  Procedures (including critical care time) Labs Review Labs Reviewed - No data to display  Imaging Review Dg Chest 2 View  04/25/2014   CLINICAL DATA:  Chest pressure for 3 days.  EXAM: CHEST  2 VIEW  COMPARISON:  06/30/2013  FINDINGS: The heart size and mediastinal contours are within normal limits. Both lungs are clear. No pleural effusion. No pneumothorax. The visualized skeletal structures are unremarkable.  IMPRESSION: Normal chest radiographs.   Electronically Signed   By: Lajean Manes M.D.   On: 04/25/2014 07:22     EKG Interpretation   Date/Time:  Thursday April 25 2014 07:01:52 EDT Ventricular Rate:  73 PR Interval:  144 QRS Duration: 86 QT Interval:  364 QTC Calculation: 401 R Axis:   72 Text Interpretation:  Normal sinus rhythm Normal ECG No old tracing to  compare Confirmed by [facility]  MD, DAVID ([id number]) on 04/25/2014 7:02:22 AM      MDM   Final diagnoses:  Chest pain, unspecified chest pain type    Patient presents with reproducible pain to her sternum. She has no anginal type symptoms. It's nonexertional. She has no ischemic changes and EKG. She has no suggestions of pulmonary embolus. Her chest x-ray is normal with a normal heart size. I don't see any suggestions of pericardial effusion. I feel this is likely musculoskeletal.  She was given a prescription for Naprosyn and encouraged have primary care followup. She was  advised to return here if her symptoms worsen.    Malvin Johns, MD 04/25/14 [phone]

## 2014-05-03 ENCOUNTER — Encounter (HOSPITAL_BASED_OUTPATIENT_CLINIC_OR_DEPARTMENT_OTHER): Payer: Self-pay | Admitting: Emergency Medicine

## 2014-05-03 ENCOUNTER — Emergency Department (HOSPITAL_BASED_OUTPATIENT_CLINIC_OR_DEPARTMENT_OTHER)
Admission: EM | Admit: 2014-05-03 | Discharge: 2014-05-03 | Disposition: A | Discharge status: Home / Self Care | Payer: Medicaid Other | Attending: Emergency Medicine | Admitting: Emergency Medicine

## 2014-05-03 DIAGNOSIS — X58XXXA Exposure to other specified factors, initial encounter: Secondary | ICD-10-CM | POA: Diagnosis not present

## 2014-05-03 DIAGNOSIS — Z3202 Encounter for pregnancy test, result negative: Secondary | ICD-10-CM | POA: Insufficient documentation

## 2014-05-03 DIAGNOSIS — Z8739 Personal history of other diseases of the musculoskeletal system and connective tissue: Secondary | ICD-10-CM | POA: Diagnosis not present

## 2014-05-03 DIAGNOSIS — Z791 Long term (current) use of non-steroidal anti-inflammatories (NSAID): Secondary | ICD-10-CM | POA: Diagnosis not present

## 2014-05-03 DIAGNOSIS — IMO0002 Reserved for concepts with insufficient information to code with codable children: Secondary | ICD-10-CM | POA: Insufficient documentation

## 2014-05-03 DIAGNOSIS — Z880 Allergy status to penicillin: Secondary | ICD-10-CM | POA: Diagnosis not present

## 2014-05-03 DIAGNOSIS — S0502XA Injury of conjunctiva and corneal abrasion without foreign body, left eye, initial encounter: Secondary | ICD-10-CM

## 2014-05-03 DIAGNOSIS — J45909 Unspecified asthma, uncomplicated: Secondary | ICD-10-CM | POA: Diagnosis not present

## 2014-05-03 DIAGNOSIS — Y939 Activity, unspecified: Secondary | ICD-10-CM | POA: Insufficient documentation

## 2014-05-03 DIAGNOSIS — H5789 Other specified disorders of eye and adnexa: Secondary | ICD-10-CM | POA: Diagnosis present

## 2014-05-03 DIAGNOSIS — Y929 Unspecified place or not applicable: Secondary | ICD-10-CM | POA: Insufficient documentation

## 2014-05-03 DIAGNOSIS — N390 Urinary tract infection, site not specified: Secondary | ICD-10-CM | POA: Insufficient documentation

## 2014-05-03 DIAGNOSIS — Z792 Long term (current) use of antibiotics: Secondary | ICD-10-CM | POA: Diagnosis not present

## 2014-05-03 DIAGNOSIS — S058X9A Other injuries of unspecified eye and orbit, initial encounter: Secondary | ICD-10-CM | POA: Diagnosis not present

## 2014-05-03 DIAGNOSIS — K649 Unspecified hemorrhoids: Secondary | ICD-10-CM

## 2014-05-03 DIAGNOSIS — H209 Unspecified iridocyclitis: Secondary | ICD-10-CM

## 2014-05-03 LAB — URINALYSIS, ROUTINE W REFLEX MICROSCOPIC
Bilirubin Urine: NEGATIVE
Glucose, UA: NEGATIVE mg/dL
Ketones, ur: NEGATIVE mg/dL
NITRITE: NEGATIVE
Protein, ur: NEGATIVE mg/dL
SPECIFIC GRAVITY, URINE: 1.021 (ref 1.005–1.030)
Urobilinogen, UA: 1 mg/dL (ref 0.0–1.0)
pH: 5.5 (ref 5.0–8.0)

## 2014-05-03 LAB — URINE MICROSCOPIC-ADD ON

## 2014-05-03 LAB — PREGNANCY, URINE: PREG TEST UR: NEGATIVE

## 2014-05-03 MED ORDER — ERYTHROMYCIN 5 MG/GM OP OINT
TOPICAL_OINTMENT | Freq: Four times a day (QID) | OPHTHALMIC | Status: DC
  Administered 2014-05-03: 1 via OPHTHALMIC
  Filled 2014-05-03: qty 3.5

## 2014-05-03 MED ORDER — FLUORESCEIN SODIUM 1 MG OP STRP
ORAL_STRIP | OPHTHALMIC | Status: AC
  Administered 2014-05-03: 1 via OPHTHALMIC
  Filled 2014-05-03: qty 1

## 2014-05-03 MED ORDER — TETRACAINE HCL 0.5 % OP SOLN
2.0000 [drp] | Freq: Once | OPHTHALMIC | Status: AC
  Administered 2014-05-03: 2 [drp] via OPHTHALMIC

## 2014-05-03 MED ORDER — CIPROFLOXACIN HCL 500 MG PO TABS: 500.0000 mg | ORAL_TABLET | Freq: Two times a day (BID) | ORAL | Status: DC

## 2014-05-03 MED ORDER — CYCLOPENTOLATE HCL 1 % OP SOLN: 1.0000 [drp] | Freq: Every day | OPHTHALMIC | Status: DC

## 2014-05-03 MED ORDER — TETRACAINE HCL 0.5 % OP SOLN
OPHTHALMIC | Status: AC
  Administered 2014-05-03: 2 [drp] via OPHTHALMIC
  Filled 2014-05-03: qty 2

## 2014-05-03 MED ORDER — FLUORESCEIN SODIUM 1 MG OP STRP
1.0000 | ORAL_STRIP | Freq: Once | OPHTHALMIC | Status: AC
  Administered 2014-05-03: 1 via OPHTHALMIC

## 2014-05-03 NOTE — ED Provider Notes (Signed)
CSN: [id number]     Arrival date & time 05/03/14  0120 History   First MD Initiated Contact with Patient 05/03/14 0143     Chief Complaint  Patient presents with  . Eye Drainage     Patient is a 28 y.o. female presenting with eye pain and dysuria. The history is provided by the patient.  Eye Pain This is a new problem. The current episode started more than 2 days ago. The problem occurs constantly. The problem has been gradually worsening. Pertinent negatives include no abdominal pain. Exacerbated by: light. Nothing relieves the symptoms. She has tried rest for the symptoms. The treatment provided no relief.  Dysuria Pain severity:  Mild Onset quality:  Gradual Timing:  Intermittent Progression:  Worsening Chronicity:  New Relieved by:  None tried Worsened by:  Nothing tried Urinary symptoms: foul-smelling urine   Associated symptoms: no abdominal pain, no fever and no vaginal discharge   pt also reports "bump" to her bottom.  Unclear cause but she reports pain with defecation   For left eye pain, recent h/o corneal abrasions.  No recent trauma or surgery Also, she does not wear contacts  Past Medical History  Diagnosis Date  . Lupus     questionable, no meds or treatment per pt  . Asthma    Past Surgical History  Procedure Laterality Date  . Cesarean section     No family history on file. History  Substance Use Topics  . Smoking status: Never Smoker   . Smokeless tobacco: Not on file  . Alcohol Use: Yes     Comment: socially   OB History   Grav Para Term Preterm Abortions TAB SAB Ect Mult Living   3 1 1  0 1 0 1 0 0 1     Review of Systems  Constitutional: Negative for fever.  Eyes: Positive for pain.  Gastrointestinal: Negative for abdominal pain.  Genitourinary: Positive for dysuria. Negative for vaginal discharge.      Allergies  Penicillins  Home Medications   Prior to Admission medications   Medication Sig Start Date End Date Taking? Authorizing  Provider  ciprofloxacin (CILOXAN) 0.3 % ophthalmic solution Place 1-2 drops into the left eye 4 (four) times daily. X 5 days 03/08/14   Clayton Bibles, PA-C  erythromycin ophthalmic ointment Place 1 application into the left eye 4 (four) times daily. Place 1/2 inch ribbon of ointment in the affected eye 4 times a day 03/15/14   Elwyn Lade, PA-C  naproxen (NAPROSYN) 375 MG tablet Take 1 tablet (375 mg total) by mouth 2 (two) times daily. 04/25/14   Malvin Johns, MD  oxyCODONE-acetaminophen (PERCOCET/ROXICET) 5-325 MG per tablet Take 1-2 tablets by mouth every 4 (four) hours as needed for moderate pain or severe pain. 03/08/14   Clayton Bibles, PA-C  predniSONE (DELTASONE) 20 MG tablet 3 tabs po day one, then 2 po daily x 4 days 03/15/14   Elwyn Lade, PA-C   BP 140/90  Pulse 90  Temp(Src) 98.1 F (36.7 C) (Oral)  Resp 20  Ht 5\' 8"  (1.727 m)  Wt 250 lb (113.399 kg)  BMI 38.02 kg/m2  SpO2 100%  LMP 04/10/2014 Physical Exam CONSTITUTIONAL: Well developed/well nourished HEAD: Normocephalic/atraumatic EYES: EOMI/PERRL Consensual pain noted to OS.  Small corneal abrasion noted to OS.  Conjunctival injection noted to OS.  No foreign body.  No proptosis. OS IOP less than 20. No hyphema/hypopyon noted ENMT: Mucous membranes moist NECK: supple no meningeal signs SPINE:entire spine  nontender CV: S1/S2 noted, no murmurs/rubs/gallops noted LUNGS: Lungs are clear to auscultation bilaterally, no apparent distress ABDOMEN: soft, nontender, no rebound or guarding Rectal - small external hemorrhoid noted.  It is not thrombosed.  Female chaperone present GU:no cva tenderness NEURO: Pt is awake/alert, moves all extremitiesx4 EXTREMITIES: pulses normal, full ROM SKIN: warm, color normal PSYCH: no abnormalities of mood noted  ED Course  Procedures  For her eye complaints - she reports frequent abrasions, does not wear contacts On this visit, I am suspicious for iritis (consensual pain, conjunctival  injection) Will prescribe cyclogyl (advised of side effects, need to avoid driving) I advised her to f/u with ophthalmology today as she has had recent increase in corneal abrasions Labs Review Labs Reviewed  URINALYSIS, ROUTINE W REFLEX MICROSCOPIC - Abnormal; Notable for the following:    APPearance CLOUDY (*)    Hgb urine dipstick TRACE (*)    Leukocytes, UA SMALL (*)    All other components within normal limits  URINE MICROSCOPIC-ADD ON - Abnormal; Notable for the following:    Squamous Epithelial / LPF MANY (*)    Bacteria, UA MANY (*)    All other components within normal limits  PREGNANCY, URINE      MDM   Final diagnoses:  Hemorrhoids, unspecified hemorrhoid type  UTI (lower urinary tract infection)  Iritis  Corneal abrasion, left, initial encounter    Nursing notes including past medical history and social history reviewed and considered in documentation Previous records reviewed and considered     Sharyon Cable, MD 05/03/14 [phone]

## 2014-05-03 NOTE — ED Notes (Signed)
MD at bedside. 

## 2014-05-03 NOTE — ED Notes (Signed)
Pt c/o lt eye pain/redness/drainage x3days, "bump on my bottom" x1wk getting larger, c/o difficulty urinating with odor x1wk; pt states "I feel like all of this is my lupus", states just moved here doesn't have a PCP

## 2014-06-12 ENCOUNTER — Encounter (HOSPITAL_BASED_OUTPATIENT_CLINIC_OR_DEPARTMENT_OTHER): Payer: Self-pay | Admitting: Emergency Medicine

## 2014-06-12 DIAGNOSIS — Z3202 Encounter for pregnancy test, result negative: Secondary | ICD-10-CM | POA: Insufficient documentation

## 2014-06-12 DIAGNOSIS — Z880 Allergy status to penicillin: Secondary | ICD-10-CM | POA: Diagnosis not present

## 2014-06-12 DIAGNOSIS — Z8739 Personal history of other diseases of the musculoskeletal system and connective tissue: Secondary | ICD-10-CM | POA: Diagnosis not present

## 2014-06-12 DIAGNOSIS — Z792 Long term (current) use of antibiotics: Secondary | ICD-10-CM | POA: Diagnosis not present

## 2014-06-12 DIAGNOSIS — Z79899 Other long term (current) drug therapy: Secondary | ICD-10-CM | POA: Diagnosis not present

## 2014-06-12 DIAGNOSIS — R109 Unspecified abdominal pain: Secondary | ICD-10-CM | POA: Diagnosis present

## 2014-06-12 DIAGNOSIS — Z791 Long term (current) use of non-steroidal anti-inflammatories (NSAID): Secondary | ICD-10-CM | POA: Insufficient documentation

## 2014-06-12 DIAGNOSIS — N3000 Acute cystitis without hematuria: Secondary | ICD-10-CM | POA: Insufficient documentation

## 2014-06-12 DIAGNOSIS — J45909 Unspecified asthma, uncomplicated: Secondary | ICD-10-CM | POA: Insufficient documentation

## 2014-06-12 LAB — PREGNANCY, URINE: Preg Test, Ur: NEGATIVE

## 2014-06-12 LAB — URINALYSIS, ROUTINE W REFLEX MICROSCOPIC
Bilirubin Urine: NEGATIVE
GLUCOSE, UA: NEGATIVE mg/dL
Ketones, ur: NEGATIVE mg/dL
Nitrite: NEGATIVE
PROTEIN: NEGATIVE mg/dL
Specific Gravity, Urine: 1.024 (ref 1.005–1.030)
UROBILINOGEN UA: 1 mg/dL (ref 0.0–1.0)
pH: 5.5 (ref 5.0–8.0)

## 2014-06-12 LAB — URINE MICROSCOPIC-ADD ON

## 2014-06-12 NOTE — ED Notes (Signed)
C/o left flank pain, urinary freq-hx of UTI with tx x 3 months-completed bactrim 10/5

## 2014-06-13 ENCOUNTER — Emergency Department (HOSPITAL_BASED_OUTPATIENT_CLINIC_OR_DEPARTMENT_OTHER)
Admission: EM | Admit: 2014-06-13 | Discharge: 2014-06-13 | Disposition: A | Discharge status: Home / Self Care | Payer: Medicaid Other | Attending: Emergency Medicine | Admitting: Emergency Medicine

## 2014-06-13 DIAGNOSIS — N3000 Acute cystitis without hematuria: Secondary | ICD-10-CM

## 2014-06-13 HISTORY — DX: Hyperglycemia, unspecified: R73.9

## 2014-06-13 MED ORDER — HYDROCODONE-ACETAMINOPHEN 5-325 MG PO TABS: 2.0000 | ORAL_TABLET | ORAL | Status: DC | PRN

## 2014-06-13 MED ORDER — CEPHALEXIN 500 MG PO CAPS: 500.0000 mg | ORAL_CAPSULE | Freq: Four times a day (QID) | ORAL | Status: DC

## 2014-06-13 NOTE — ED Provider Notes (Signed)
CSN: [id number]     Arrival date & time 06/12/14  2146 History   First MD Initiated Contact with Patient 06/13/14 0016     Chief Complaint  Patient presents with  . Flank Pain     (Consider location/radiation/quality/duration/timing/severity/associated sxs/prior Treatment) HPI Comments: Patient is a 28 year old female with history of lupus. She's been treated for multiple urinary tract infections over the past month with several antibiotics. She presents today with complaints of left flank pain and burning with urination. She also states that her urine is foul-smelling. She denies any vaginal bleeding or vaginal discharge. She has an appointment with urology in the upcoming weeks.  Patient is a 28 y.o. female presenting with flank pain. The history is provided by the patient.  Flank Pain This is a recurrent problem. The current episode started 2 days ago. The problem occurs constantly. The problem has been gradually worsening. Pertinent negatives include no abdominal pain. Exacerbated by: Urinating. Nothing relieves the symptoms. She has tried nothing for the symptoms. The treatment provided no relief.    Past Medical History  Diagnosis Date  . Lupus     questionable, no meds or treatment per pt  . Asthma   . Elevated blood sugar    Past Surgical History  Procedure Laterality Date  . Cesarean section     No family history on file. History  Substance Use Topics  . Smoking status: Never Smoker   . Smokeless tobacco: Not on file  . Alcohol Use: Yes     Comment: socially   OB History   Grav Para Term Preterm Abortions TAB SAB Ect Mult Living   3 1 1  0 1 0 1 0 0 1     Review of Systems  Gastrointestinal: Negative for abdominal pain.  Genitourinary: Positive for flank pain.  All other systems reviewed and are negative.     Allergies  Penicillins  Home Medications   Prior to Admission medications   Medication Sig Start Date End Date Taking? Authorizing Provider   Atorvastatin Calcium (LIPITOR PO) Take by mouth.   Yes Historical Provider, MD  cephALEXin (KEFLEX) 500 MG capsule Take 1 capsule (500 mg total) by mouth 4 (four) times daily. 06/13/14   Veryl Speak, MD  ciprofloxacin (CIPRO) 500 MG tablet Take 1 tablet (500 mg total) by mouth 2 (two) times daily. One po bid x 7 days 05/03/14   Sharyon Cable, MD  cyclopentolate (CYCLOGYL) 1 % ophthalmic solution Place 1 drop into the left eye daily. 05/03/14   Sharyon Cable, MD  HYDROcodone-acetaminophen [facility]) 5-325 MG per tablet Take 2 tablets by mouth every 4 (four) hours as needed. 06/13/14   Veryl Speak, MD  naproxen (NAPROSYN) 375 MG tablet Take 1 tablet (375 mg total) by mouth 2 (two) times daily. 04/25/14   Malvin Johns, MD   BP 132/67  Pulse 67  Temp(Src) 98.4 F (36.9 C) (Oral)  Resp 20  Ht 5\' 8"  (1.727 m)  Wt 255 lb (115.667 kg)  BMI 38.78 kg/m2  SpO2 100%  LMP 05/30/2014 Physical Exam  Nursing note and vitals reviewed. Constitutional: She is oriented to person, place, and time. She appears well-developed and well-nourished. No distress.  HENT:  Head: Normocephalic and atraumatic.  Neck: Normal range of motion. Neck supple.  Cardiovascular: Normal rate and regular rhythm.  Exam reveals no gallop and no friction rub.   No murmur heard. Pulmonary/Chest: Effort normal and breath sounds normal. No respiratory distress. She has no wheezes.  Abdominal: Soft. Bowel sounds are normal. She exhibits no distension. There is no tenderness.  There is tenderness to palpation in the left flank.  Musculoskeletal: Normal range of motion.  Neurological: She is alert and oriented to person, place, and time.  Skin: Skin is warm and dry. She is not diaphoretic.    ED Course  Procedures (including critical care time) Labs Review Labs Reviewed  URINALYSIS, ROUTINE W REFLEX MICROSCOPIC - Abnormal; Notable for the following:    Hgb urine dipstick TRACE (*)    Leukocytes, UA SMALL (*)    All other  components within normal limits  URINE MICROSCOPIC-ADD ON - Abnormal; Notable for the following:    Squamous Epithelial / LPF MANY (*)    Bacteria, UA MANY (*)    All other components within normal limits  URINE CULTURE  PREGNANCY, URINE    Imaging Review No results found.   EKG Interpretation None      MDM   Final diagnoses:  Acute cystitis without hematuria    UA reveals a mild UTI. Given her symptoms. She will be treated with Keflex, Pyridium, and followup with urology. I have ordered a culture of the urine to determine the organism and susceptibilities to ensure she is on the proper antibiotic.    Veryl Speak, MD 06/13/14 [phone]

## 2014-06-13 NOTE — Discharge Instructions (Signed)
Keflex as prescribed.  Hydrocodone as prescribed as needed for pain.  Followup with your primary doctor and urologist as scheduled.   Urinary Tract Infection Urinary tract infections (UTIs) can develop anywhere along your urinary tract. Your urinary tract is your body's drainage system for removing wastes and extra water. Your urinary tract includes two kidneys, two ureters, a bladder, and a urethra. Your kidneys are a pair of bean-shaped organs. Each kidney is about the size of your fist. They are located below your ribs, one on each side of your spine. CAUSES Infections are caused by microbes, which are microscopic organisms, including fungi, viruses, and bacteria. These organisms are so small that they can only be seen through a microscope. Bacteria are the microbes that most commonly cause UTIs. SYMPTOMS  Symptoms of UTIs may vary by age and gender of the patient and by the location of the infection. Symptoms in young women typically include a frequent and intense urge to urinate and a painful, burning feeling in the bladder or urethra during urination. Older women and men are more likely to be tired, shaky, and weak and have muscle aches and abdominal pain. A fever may mean the infection is in your kidneys. Other symptoms of a kidney infection include pain in your back or sides below the ribs, nausea, and vomiting. DIAGNOSIS To diagnose a UTI, your caregiver will ask you about your symptoms. Your caregiver also will ask to provide a urine sample. The urine sample will be tested for bacteria and white blood cells. White blood cells are made by your body to help fight infection. TREATMENT  Typically, UTIs can be treated with medication. Because most UTIs are caused by a bacterial infection, they usually can be treated with the use of antibiotics. The choice of antibiotic and length of treatment depend on your symptoms and the type of bacteria causing your infection. HOME CARE INSTRUCTIONS  If  you were prescribed antibiotics, take them exactly as your caregiver instructs you. Finish the medication even if you feel better after you have only taken some of the medication.  Drink enough water and fluids to keep your urine clear or pale yellow.  Avoid caffeine, tea, and carbonated beverages. They tend to irritate your bladder.  Empty your bladder often. Avoid holding urine for long periods of time.  Empty your bladder before and after sexual intercourse.  After a bowel movement, women should cleanse from front to back. Use each tissue only once. SEEK MEDICAL CARE IF:   You have back pain.  You develop a fever.  Your symptoms do not begin to resolve within 3 days. SEEK IMMEDIATE MEDICAL CARE IF:   You have severe back pain or lower abdominal pain.  You develop chills.  You have nausea or vomiting.  You have continued burning or discomfort with urination. MAKE SURE YOU:   Understand these instructions.  Will watch your condition.  Will get help right away if you are not doing well or get worse. Document Released: 05/26/2005 Document Revised: 02/15/2012 Document Reviewed: 09/24/2011 [facility] Patient Information 2015 [place], [place]. This information is not intended to replace advice given to you by your health care provider. Make sure you discuss any questions you have with your health care provider.

## 2014-06-13 NOTE — ED Notes (Signed)
PT discharged to home NAD.  

## 2014-06-15 LAB — URINE CULTURE
Colony Count: >=100000
Special Requests: NORMAL

## 2014-06-17 ENCOUNTER — Telehealth (HOSPITAL_BASED_OUTPATIENT_CLINIC_OR_DEPARTMENT_OTHER): Payer: Self-pay

## 2014-06-17 NOTE — Telephone Encounter (Signed)
Post ED Visit - Positive Culture Follow-up  Culture report reviewed by antimicrobial stewardship pharmacist: []  Wes [place], Pharm.D., BCPS [x]  Heide Guile, Pharm.D., BCPS []  Alycia Rossetti, Pharm.D., BCPS []  [place], Pharm.D., BCPS, AAHIVP []  Legrand [place], Pharm.D., BCPS, AAHIVP []  Carly Sabat, Pharm.D. []  Elenor Quinones, Pharm.D.  Positive Urine culture Treated with Cephalexin, organism sensitive to the same and no further patient follow-up is required at this time.  Dortha Kern 06/17/2014, 4:39 AM

## 2014-07-01 ENCOUNTER — Encounter (HOSPITAL_BASED_OUTPATIENT_CLINIC_OR_DEPARTMENT_OTHER): Payer: Self-pay | Admitting: Emergency Medicine

## 2014-10-03 ENCOUNTER — Encounter (HOSPITAL_BASED_OUTPATIENT_CLINIC_OR_DEPARTMENT_OTHER): Payer: Self-pay | Admitting: Emergency Medicine

## 2014-10-03 ENCOUNTER — Emergency Department (HOSPITAL_BASED_OUTPATIENT_CLINIC_OR_DEPARTMENT_OTHER)
Admission: EM | Admit: 2014-10-03 | Discharge: 2014-10-03 | Disposition: A | Discharge status: Home / Self Care | Payer: Medicaid Other | Attending: Emergency Medicine | Admitting: Emergency Medicine

## 2014-10-03 DIAGNOSIS — Z8739 Personal history of other diseases of the musculoskeletal system and connective tissue: Secondary | ICD-10-CM | POA: Diagnosis not present

## 2014-10-03 DIAGNOSIS — N3000 Acute cystitis without hematuria: Secondary | ICD-10-CM | POA: Insufficient documentation

## 2014-10-03 DIAGNOSIS — Z791 Long term (current) use of non-steroidal anti-inflammatories (NSAID): Secondary | ICD-10-CM | POA: Diagnosis not present

## 2014-10-03 DIAGNOSIS — J45909 Unspecified asthma, uncomplicated: Secondary | ICD-10-CM | POA: Diagnosis not present

## 2014-10-03 DIAGNOSIS — Z880 Allergy status to penicillin: Secondary | ICD-10-CM | POA: Diagnosis not present

## 2014-10-03 DIAGNOSIS — Z792 Long term (current) use of antibiotics: Secondary | ICD-10-CM | POA: Insufficient documentation

## 2014-10-03 DIAGNOSIS — Z3202 Encounter for pregnancy test, result negative: Secondary | ICD-10-CM | POA: Insufficient documentation

## 2014-10-03 DIAGNOSIS — M545 Low back pain: Secondary | ICD-10-CM | POA: Diagnosis present

## 2014-10-03 LAB — URINE MICROSCOPIC-ADD ON

## 2014-10-03 LAB — URINALYSIS, ROUTINE W REFLEX MICROSCOPIC
Bilirubin Urine: NEGATIVE
GLUCOSE, UA: NEGATIVE mg/dL
Ketones, ur: NEGATIVE mg/dL
Nitrite: NEGATIVE
PH: 6 (ref 5.0–8.0)
Protein, ur: NEGATIVE mg/dL
Specific Gravity, Urine: 1.021 (ref 1.005–1.030)
Urobilinogen, UA: 1 mg/dL (ref 0.0–1.0)

## 2014-10-03 LAB — PREGNANCY, URINE: PREG TEST UR: NEGATIVE

## 2014-10-03 MED ORDER — PHENAZOPYRIDINE HCL 100 MG PO TABS
95.0000 mg | ORAL_TABLET | Freq: Once | ORAL | Status: AC
  Administered 2014-10-03: 100 mg via ORAL
  Filled 2014-10-03: qty 1

## 2014-10-03 MED ORDER — NITROFURANTOIN MONOHYD MACRO 100 MG PO CAPS: 100.0000 mg | ORAL_CAPSULE | Freq: Two times a day (BID) | ORAL | Status: DC

## 2014-10-03 MED ORDER — NITROFURANTOIN MONOHYD MACRO 100 MG PO CAPS
100.0000 mg | ORAL_CAPSULE | Freq: Once | ORAL | Status: AC
  Administered 2014-10-03: 100 mg via ORAL
  Filled 2014-10-03: qty 1

## 2014-10-03 MED ORDER — PHENAZOPYRIDINE HCL 200 MG PO TABS: 200.0000 mg | ORAL_TABLET | Freq: Three times a day (TID) | ORAL | Status: DC

## 2014-10-03 NOTE — ED Provider Notes (Signed)
CSN: [id number]     Arrival date & time 10/03/14  0137 History   First MD Initiated Contact with Patient 10/03/14 0153     Chief Complaint  Patient presents with  . Back Pain     (Consider location/radiation/quality/duration/timing/severity/associated sxs/prior Treatment) HPI  This is a 29 year old female with a history of lupus for urinary tract infections. She is here with low back pain, left suprapubic pain and foul-smelling urine that have been present since mid January. They have worsened recently and became acutely worse this morning. The pain is intermittent and it became severe at work earlier this morning. It is eased at the present time. Pain is worse with movement or ambulation. The back pain is more prominent on the left side. She is having increased discomfort with urination, a sense of incomplete voiding as well as incontinence of urine after she feels she has completed voiding. She denies fever or chills. She does sometimes get nauseated when the pain is severe. She denies diarrhea, vaginal bleeding or vaginal discharge. She does have a history of ovarian cysts.  Past Medical History  Diagnosis Date  . Lupus     questionable, no meds or treatment per pt  . Asthma   . Elevated blood sugar    Past Surgical History  Procedure Laterality Date  . Cesarean section     History reviewed. No pertinent family history. History  Substance Use Topics  . Smoking status: Never Smoker   . Smokeless tobacco: Not on file  . Alcohol Use: Yes     Comment: socially   OB History    Gravida Para Term Preterm AB TAB SAB Ectopic Multiple Living   3 1 1  0 1 0 1 0 0 1     Review of Systems  All other systems reviewed and are negative.   Allergies  Penicillins  Home Medications   Prior to Admission medications   Medication Sig Start Date End Date Taking? Authorizing Provider  Atorvastatin Calcium (LIPITOR PO) Take by mouth.   Yes Historical Provider, MD  cephALEXin (KEFLEX) 500 MG  capsule Take 1 capsule (500 mg total) by mouth 4 (four) times daily. 06/13/14   Veryl Speak, MD  ciprofloxacin (CIPRO) 500 MG tablet Take 1 tablet (500 mg total) by mouth 2 (two) times daily. One po bid x 7 days 05/03/14   Sharyon Cable, MD  cyclopentolate (CYCLOGYL) 1 % ophthalmic solution Place 1 drop into the left eye daily. 05/03/14   Sharyon Cable, MD  HYDROcodone-acetaminophen [facility]) 5-325 MG per tablet Take 2 tablets by mouth every 4 (four) hours as needed. 06/13/14   Veryl Speak, MD  naproxen (NAPROSYN) 375 MG tablet Take 1 tablet (375 mg total) by mouth 2 (two) times daily. 04/25/14   Malvin Johns, MD   BP 115/72 mmHg  Pulse 74  Temp(Src) 98 F (36.7 C) (Oral)  Resp 20  Ht 5\' 8"  (1.727 m)  Wt 258 lb 8 oz (117.255 kg)  BMI 39.31 kg/m2  SpO2 100%  LMP 09/20/2014   Physical Exam  General: Well-developed, well-nourished female in no acute distress; appearance consistent with age of record HENT: normocephalic; atraumatic Eyes: pupils equal, round and reactive to light; extraocular muscles intact Neck: supple Heart: regular rate and rhythm Lungs: clear to auscultation bilaterally Abdomen: soft; nondistended; mild left suprapubic tenderness; no masses or hepatosplenomegaly; bowel sounds present GU: Mild left CVA tenderness Extremities: No deformity; full range of motion; pulses normal Neurologic: Awake, alert and oriented; motor function  intact in all extremities and symmetric; no facial droop Skin: Warm and dry Psychiatric: Normal mood and affect    ED Course  Procedures (including critical care time)   MDM   Nursing notes and vitals signs, including pulse oximetry, reviewed.  Summary of this visit's results, reviewed by myself:  Labs:  Results for orders placed or performed during the hospital encounter of 10/03/14 (from the past 24 hour(s))  Pregnancy, urine     Status: None   Collection Time: 10/03/14  1:40 AM  Result Value Ref Range   Preg Test, Ur  NEGATIVE NEGATIVE  Urinalysis, Routine w reflex microscopic     Status: Abnormal   Collection Time: 10/03/14  1:40 AM  Result Value Ref Range   Color, Urine YELLOW YELLOW   APPearance CLOUDY (A) CLEAR   Specific Gravity, Urine 1.021 1.005 - 1.030   pH 6.0 5.0 - 8.0   Glucose, UA NEGATIVE NEGATIVE mg/dL   Hgb urine dipstick MODERATE (A) NEGATIVE   Bilirubin Urine NEGATIVE NEGATIVE   Ketones, ur NEGATIVE NEGATIVE mg/dL   Protein, ur NEGATIVE NEGATIVE mg/dL   Urobilinogen, UA 1.0 0.0 - 1.0 mg/dL   Nitrite NEGATIVE NEGATIVE   Leukocytes, UA SMALL (A) NEGATIVE  Urine microscopic-add on     Status: Abnormal   Collection Time: 10/03/14  1:40 AM  Result Value Ref Range   Squamous Epithelial / LPF MANY (A) RARE   WBC, UA 7-10 <3 WBC/hpf   RBC / HPF 3-6 <3 RBC/hpf   Bacteria, UA MANY (A) RARE   Urine-Other MUCOUS PRESENT    Patient will follow up with her OB/GYN regarding her urinary incontinence. We will treat for acute urinary tract infection. Patient's chronic symptoms may represent interstitial cystitis and she was advised to dress this with her OB/GYN as well.  Wynetta Fines, MD 10/03/14 [phone]

## 2014-10-03 NOTE — Discharge Instructions (Signed)

## 2014-10-03 NOTE — ED Notes (Signed)
Pt states she has had lower back pain and foul urine since 3rd week in jan. Pt reports that she did see pcp in jan was treated with some antibiotic shot, reports lupus and states that this lower back pain has been going on since 3rd jan and progressively getting worse

## 2014-10-03 NOTE — ED Notes (Signed)
C/o lower back and abd pain x 3 weeks increased pain, pressure  Worse last 1.2 week,  Worse on left side,  odor in urine

## 2014-10-05 LAB — URINE CULTURE

## 2014-10-07 ENCOUNTER — Telehealth (HOSPITAL_COMMUNITY): Payer: Self-pay

## 2014-10-07 NOTE — Telephone Encounter (Signed)
Post ED Visit - Positive Culture Follow-up  Culture report reviewed by antimicrobial stewardship pharmacist: []  Wes [place], Pharm.D., BCPS []  Heide Guile, Pharm.D., BCPS []  Alycia Rossetti, Pharm.D., BCPS [x]  [place], Pharm.D., BCPS, AAHIVP []  Legrand [place], Pharm.D., BCPS, AAHIVP []  Isac Sarna, Pharm.D., BCPS  Positive Urine culture, >/= 100,000 colonies -> Citrobacter Koseri Treated with Nitrofurantoin, organism sensitive to the same and no further patient follow-up is required at this time.  Dortha Kern 10/07/2014, 5:26 AM

## 2014-12-10 ENCOUNTER — Emergency Department (HOSPITAL_BASED_OUTPATIENT_CLINIC_OR_DEPARTMENT_OTHER)
Admission: EM | Admit: 2014-12-10 | Discharge: 2014-12-10 | Disposition: A | Discharge status: Home / Self Care | Payer: Medicaid Other | Attending: Emergency Medicine | Admitting: Emergency Medicine

## 2014-12-10 ENCOUNTER — Encounter (HOSPITAL_BASED_OUTPATIENT_CLINIC_OR_DEPARTMENT_OTHER): Payer: Self-pay | Admitting: *Deleted

## 2014-12-10 DIAGNOSIS — N390 Urinary tract infection, site not specified: Secondary | ICD-10-CM | POA: Insufficient documentation

## 2014-12-10 DIAGNOSIS — J45909 Unspecified asthma, uncomplicated: Secondary | ICD-10-CM | POA: Insufficient documentation

## 2014-12-10 DIAGNOSIS — Z8739 Personal history of other diseases of the musculoskeletal system and connective tissue: Secondary | ICD-10-CM | POA: Insufficient documentation

## 2014-12-10 DIAGNOSIS — R1032 Left lower quadrant pain: Secondary | ICD-10-CM | POA: Diagnosis present

## 2014-12-10 DIAGNOSIS — Z87442 Personal history of urinary calculi: Secondary | ICD-10-CM | POA: Insufficient documentation

## 2014-12-10 DIAGNOSIS — Z3202 Encounter for pregnancy test, result negative: Secondary | ICD-10-CM | POA: Diagnosis not present

## 2014-12-10 DIAGNOSIS — Z79899 Other long term (current) drug therapy: Secondary | ICD-10-CM | POA: Insufficient documentation

## 2014-12-10 DIAGNOSIS — R102 Pelvic and perineal pain: Secondary | ICD-10-CM

## 2014-12-10 DIAGNOSIS — Z791 Long term (current) use of non-steroidal anti-inflammatories (NSAID): Secondary | ICD-10-CM | POA: Diagnosis not present

## 2014-12-10 DIAGNOSIS — Z8742 Personal history of other diseases of the female genital tract: Secondary | ICD-10-CM | POA: Diagnosis not present

## 2014-12-10 DIAGNOSIS — Z880 Allergy status to penicillin: Secondary | ICD-10-CM | POA: Insufficient documentation

## 2014-12-10 HISTORY — DX: Calculus of kidney: N20.0

## 2014-12-10 HISTORY — DX: Unspecified ovarian cyst, unspecified side: N83.209

## 2014-12-10 LAB — CBC WITH DIFFERENTIAL/PLATELET
BASOS ABS: 0 10*3/uL (ref 0.0–0.1)
Basophils Relative: 0 % (ref 0–1)
EOS ABS: 0.1 10*3/uL (ref 0.0–0.7)
Eosinophils Relative: 1 % (ref 0–5)
HCT: 41.7 % (ref 36.0–46.0)
Hemoglobin: 13.9 g/dL (ref 12.0–15.0)
Lymphocytes Relative: 32 % (ref 12–46)
Lymphs Abs: 2.9 10*3/uL (ref 0.7–4.0)
MCH: 30.2 pg (ref 26.0–34.0)
MCHC: 33.3 g/dL (ref 30.0–36.0)
MCV: 90.7 fL (ref 78.0–100.0)
Monocytes Absolute: 0.8 10*3/uL (ref 0.1–1.0)
Monocytes Relative: 9 % (ref 3–12)
NEUTROS ABS: 5.1 10*3/uL (ref 1.7–7.7)
NEUTROS PCT: 58 % (ref 43–77)
PLATELETS: 292 10*3/uL (ref 150–400)
RBC: 4.6 MIL/uL (ref 3.87–5.11)
RDW: 13.2 % (ref 11.5–15.5)
WBC: 8.9 10*3/uL (ref 4.0–10.5)

## 2014-12-10 LAB — URINALYSIS, ROUTINE W REFLEX MICROSCOPIC
BILIRUBIN URINE: NEGATIVE
Glucose, UA: NEGATIVE mg/dL
Ketones, ur: NEGATIVE mg/dL
Nitrite: NEGATIVE
PROTEIN: NEGATIVE mg/dL
Specific Gravity, Urine: 1.027 (ref 1.005–1.030)
Urobilinogen, UA: 0.2 mg/dL (ref 0.0–1.0)
pH: 6 (ref 5.0–8.0)

## 2014-12-10 LAB — COMPREHENSIVE METABOLIC PANEL
ALK PHOS: 82 U/L (ref 39–117)
ALT: 23 U/L (ref 0–35)
AST: 19 U/L (ref 0–37)
Albumin: 3.8 g/dL (ref 3.5–5.2)
Anion gap: 6 (ref 5–15)
BILIRUBIN TOTAL: 0.3 mg/dL (ref 0.3–1.2)
BUN: 13 mg/dL (ref 6–23)
CHLORIDE: 106 mmol/L (ref 96–112)
CO2: 26 mmol/L (ref 19–32)
CREATININE: 0.66 mg/dL (ref 0.50–1.10)
Calcium: 8.9 mg/dL (ref 8.4–10.5)
GFR calc Af Amer: >90 mL/min (ref >90)
Glucose, Bld: 90 mg/dL (ref 70–99)
POTASSIUM: 4.1 mmol/L (ref 3.5–5.1)
SODIUM: 138 mmol/L (ref 135–145)
Total Protein: 7.7 g/dL (ref 6.0–8.3)

## 2014-12-10 LAB — URINE MICROSCOPIC-ADD ON

## 2014-12-10 LAB — PREGNANCY, URINE: PREG TEST UR: NEGATIVE

## 2014-12-10 LAB — LIPASE, BLOOD: Lipase: 26 U/L (ref 11–59)

## 2014-12-10 MED ORDER — NITROFURANTOIN MONOHYD MACRO 100 MG PO CAPS: 100.0000 mg | ORAL_CAPSULE | Freq: Two times a day (BID) | ORAL | Status: DC

## 2014-12-10 MED ORDER — SODIUM CHLORIDE 0.9 % IV SOLN
Freq: Once | INTRAVENOUS | Status: AC
  Administered 2014-12-10: 21:00:00 via INTRAVENOUS

## 2014-12-10 MED ORDER — ONDANSETRON HCL 4 MG/2ML IJ SOLN
4.0000 mg | Freq: Once | INTRAMUSCULAR | Status: AC
  Administered 2014-12-10: 4 mg via INTRAVENOUS
  Filled 2014-12-10: qty 2

## 2014-12-10 MED ORDER — HYDROCODONE-ACETAMINOPHEN 5-325 MG PO TABS: 2.0000 | ORAL_TABLET | ORAL | Status: DC | PRN

## 2014-12-10 MED ORDER — PHENAZOPYRIDINE HCL 200 MG PO TABS: 200.0000 mg | ORAL_TABLET | Freq: Three times a day (TID) | ORAL | Status: DC

## 2014-12-10 MED ORDER — MORPHINE SULFATE 4 MG/ML IJ SOLN
4.0000 mg | Freq: Once | INTRAMUSCULAR | Status: AC
  Administered 2014-12-10: 4 mg via INTRAVENOUS
  Filled 2014-12-10: qty 1

## 2014-12-10 NOTE — Discharge Instructions (Signed)
Pelvic Pain Pelvic pain is pain felt below the belly button and between your hips. It can be caused by many different things. It is important to get help right away. This is especially true for severe, sharp, or unusual pain that comes on suddenly.  HOME CARE  Only take medicine as told by your doctor.  Rest as told by your doctor.  Eat a healthy diet, such as fruits, vegetables, and lean meats.  Drink enough fluids to keep your pee (urine) clear or pale yellow, or as told.  Avoid sex (intercourse) if it causes pain.  Apply warm or cold packs to your lower belly (abdomen). Use the type of pack that helps the pain.  Avoid situations that cause you stress.  Keep a journal to track your pain. Write down:  When the pain started.  Where it is located.  If there are things that seem to be related to the pain, such as food or your period.  Follow up with your doctor as told. GET HELP RIGHT AWAY IF:   You have heavy bleeding from the vagina.  You have more pelvic pain.  You feel lightheaded or pass out (faint).  You have chills.  You have pain when you pee or have blood in your pee.  You cannot stop having watery poop (diarrhea).  You cannot stop throwing up (vomiting).  You have a fever or lasting symptoms for more than 3 days.  You have a fever and your symptoms suddenly get worse.  You are being physically or sexually abused.  Your medicine does not help your pain.  You have fluid (discharge) coming from your vagina that is not normal. MAKE SURE YOU:  Understand these instructions.  Will watch your condition.  Will get help if you are not doing well or get worse. Document Released: 02/02/2008 Document Revised: 02/15/2012 Document Reviewed: 12/06/2011 [facility] Patient Information 2015 [place], [place]. This information is not intended to replace advice given to you by your health care provider. Make sure you discuss any questions you have with your health care  provider.  Urinary Tract Infection Urinary tract infections (UTIs) can develop anywhere along your urinary tract. Your urinary tract is your body's drainage system for removing wastes and extra water. Your urinary tract includes two kidneys, two ureters, a bladder, and a urethra. Your kidneys are a pair of bean-shaped organs. Each kidney is about the size of your fist. They are located below your ribs, one on each side of your spine. CAUSES Infections are caused by microbes, which are microscopic organisms, including fungi, viruses, and bacteria. These organisms are so small that they can only be seen through a microscope. Bacteria are the microbes that most commonly cause UTIs. SYMPTOMS  Symptoms of UTIs may vary by age and gender of the patient and by the location of the infection. Symptoms in young women typically include a frequent and intense urge to urinate and a painful, burning feeling in the bladder or urethra during urination. Older women and men are more likely to be tired, shaky, and weak and have muscle aches and abdominal pain. A fever may mean the infection is in your kidneys. Other symptoms of a kidney infection include pain in your back or sides below the ribs, nausea, and vomiting. DIAGNOSIS To diagnose a UTI, your caregiver will ask you about your symptoms. Your caregiver also will ask to provide a urine sample. The urine sample will be tested for bacteria and white blood cells. White blood cells  are made by your body to help fight infection. TREATMENT  Typically, UTIs can be treated with medication. Because most UTIs are caused by a bacterial infection, they usually can be treated with the use of antibiotics. The choice of antibiotic and length of treatment depend on your symptoms and the type of bacteria causing your infection. HOME CARE INSTRUCTIONS  If you were prescribed antibiotics, take them exactly as your caregiver instructs you. Finish the medication even if you feel better  after you have only taken some of the medication.  Drink enough water and fluids to keep your urine clear or pale yellow.  Avoid caffeine, tea, and carbonated beverages. They tend to irritate your bladder.  Empty your bladder often. Avoid holding urine for long periods of time.  Empty your bladder before and after sexual intercourse.  After a bowel movement, women should cleanse from front to back. Use each tissue only once. SEEK MEDICAL CARE IF:   You have back pain.  You develop a fever.  Your symptoms do not begin to resolve within 3 days. SEEK IMMEDIATE MEDICAL CARE IF:   You have severe back pain or lower abdominal pain.  You develop chills.  You have nausea or vomiting.  You have continued burning or discomfort with urination. MAKE SURE YOU:   Understand these instructions.  Will watch your condition.  Will get help right away if you are not doing well or get worse. Document Released: 05/26/2005 Document Revised: 02/15/2012 Document Reviewed: 09/24/2011 [facility] Patient Information 2015 [place], [place]. This information is not intended to replace advice given to you by your health care provider. Make sure you discuss any questions you have with your health care provider.

## 2014-12-10 NOTE — ED Notes (Signed)
Left side abd pain x 1 week- pain with urination

## 2014-12-10 NOTE — ED Provider Notes (Signed)
CSN: [id number]     Arrival date & time 12/10/14  1959 History  This chart was scribed for Orpah Greek, MD by Tula Nakayama, ED Scribe. This patient was seen in room MH04/MH04 and the patient's care was started at 8:23 PM.    Chief Complaint  Patient presents with  . Abdominal Pain   The history is provided by the patient. No language interpreter was used.    HPI Comments: Rebecca Day is a 29 y.o. female with a history of lupus, asthma, kidney stones and ovarian cysts, who presents to the Emergency Department complaining of constant, gradually worsening LLQ abdominal pain that started 1 week ago. Pt states nausea, mild left-sided lower back pain, chills and malodorous urine as associated symptoms. She reports pain becomes worse with urination. She has not tried any treatment for her symptoms PTA. Pt has a history of lupus and notes recurrent UTI over the last year. She denies right-sided abdominal pain, rectal bleeding, hematuria, vomiting and fever as associated symptoms.   Past Medical History  Diagnosis Date  . Lupus     questionable, no meds or treatment per pt  . Asthma   . Elevated blood sugar   . Kidney stones   . Ovarian cyst    Past Surgical History  Procedure Laterality Date  . Cesarean section     No family history on file. History  Substance Use Topics  . Smoking status: Never Smoker   . Smokeless tobacco: Never Used  . Alcohol Use: Yes     Comment: socially   OB History    Gravida Para Term Preterm AB TAB SAB Ectopic Multiple Living   3 1 1  0 1 0 1 0 0 1     Review of Systems  Constitutional: Positive for chills. Negative for fever.  Gastrointestinal: Positive for nausea and abdominal pain. Negative for vomiting.  Genitourinary: Negative for hematuria.  Musculoskeletal: Positive for back pain.  All other systems reviewed and are negative.     Allergies  Penicillins  Home Medications   Prior to Admission medications   Medication Sig  Start Date End Date Taking? Authorizing Provider  Atorvastatin Calcium (LIPITOR PO) Take by mouth.    Historical Provider, MD  cyclopentolate (CYCLOGYL) 1 % ophthalmic solution Place 1 drop into the left eye daily. 05/03/14   Ripley Fraise, MD  HYDROcodone-acetaminophen (NORCO/VICODIN) 5-325 MG per tablet Take 2 tablets by mouth every 4 (four) hours as needed for moderate pain. 12/10/14   Orpah Greek, MD  naproxen (NAPROSYN) 375 MG tablet Take 1 tablet (375 mg total) by mouth 2 (two) times daily. 04/25/14   Malvin Johns, MD  nitrofurantoin, macrocrystal-monohydrate, (MACROBID) 100 MG capsule Take 1 capsule (100 mg total) by mouth 2 (two) times daily. 12/10/14   Orpah Greek, MD  phenazopyridine (PYRIDIUM) 200 MG tablet Take 1 tablet (200 mg total) by mouth 3 (three) times daily. 12/10/14   Orpah Greek, MD   BP 141/83 mmHg  Pulse 110  Temp(Src) 98.3 F (36.8 C) (Oral)  Resp 16  Ht 5\' 8"  (1.727 m)  Wt 250 lb (113.399 kg)  BMI 38.02 kg/m2  SpO2 100% Physical Exam  Constitutional: She is oriented to person, place, and time. She appears well-developed and well-nourished. No distress.  HENT:  Head: Normocephalic and atraumatic.  Right Ear: Hearing normal.  Left Ear: Hearing normal.  Nose: Nose normal.  Mouth/Throat: Oropharynx is clear and moist and mucous membranes are normal.  Eyes: Conjunctivae and  EOM are normal. Pupils are equal, round, and reactive to light.  Neck: Normal range of motion. Neck supple.  Cardiovascular: Regular rhythm, S1 normal and S2 normal.  Exam reveals no gallop and no friction rub.   No murmur heard. Pulmonary/Chest: Effort normal and breath sounds normal. No respiratory distress. She exhibits no tenderness.  Abdominal: Soft. Normal appearance and bowel sounds are normal. There is no hepatosplenomegaly. There is tenderness. There is no rebound, no guarding, no tenderness at McBurney's point and negative Murphy's sign. No hernia.   Tenderness without guarding or rebound LLQ and suprapubic area; +/- CVA tenderness on the left  Musculoskeletal: Normal range of motion.  Neurological: She is alert and oriented to person, place, and time. She has normal strength. No cranial nerve deficit or sensory deficit. Coordination normal. GCS eye subscore is 4. GCS verbal subscore is 5. GCS motor subscore is 6.  Skin: Skin is warm, dry and intact. No rash noted. No cyanosis.  Psychiatric: She has a normal mood and affect. Her speech is normal and behavior is normal. Thought content normal.  Nursing note and vitals reviewed.   ED Course  Procedures   DIAGNOSTIC STUDIES: Oxygen Saturation is 100% on RA, normal by my interpretation.    COORDINATION OF CARE: 8:28 PM Discussed treatment plan with pt which includes lab work, UA and IV fluids. Pt agreed to plan.   Labs Review Labs Reviewed  URINALYSIS, ROUTINE W REFLEX MICROSCOPIC - Abnormal; Notable for the following:    APPearance CLOUDY (*)    Hgb urine dipstick SMALL (*)    Leukocytes, UA SMALL (*)    All other components within normal limits  URINE MICROSCOPIC-ADD ON - Abnormal; Notable for the following:    Squamous Epithelial / LPF MANY (*)    Bacteria, UA MANY (*)    All other components within normal limits  PREGNANCY, URINE  CBC WITH DIFFERENTIAL/PLATELET  COMPREHENSIVE METABOLIC PANEL  LIPASE, BLOOD    Imaging Review No results found.   EKG Interpretation None      MDM   Final diagnoses:  UTI (lower urinary tract infection)  Pelvic pain in female    Patient presents to the ER for evaluation of pain in the lower abdomen and back. Patient has been seen multiple times in the ER for similar. Patient has been told that she may have interstitial cystitis, but also has been treated for recurrent UTI in the past. She reports painful urination as well as malodorous urine. Urinalysis is equivocal, but similar to previous urinalysis results she has had with UTIs in  the past. She was treated with Macrobid, pain medication, Pyridium. She will follow-up with OB/GYN and urology.  I personally performed the services described in this documentation, which was scribed in my presence. The recorded information has been reviewed and is accurate.    Orpah Greek, MD 12/10/14 2259

## 2015-01-06 ENCOUNTER — Encounter: Payer: Medicaid Other | Admitting: Family Medicine

## 2016-03-04 ENCOUNTER — Emergency Department (HOSPITAL_BASED_OUTPATIENT_CLINIC_OR_DEPARTMENT_OTHER)
Admission: EM | Admit: 2016-03-04 | Discharge: 2016-03-04 | Disposition: A | Discharge status: Home / Self Care | Payer: Medicaid Other | Attending: Emergency Medicine | Admitting: Emergency Medicine

## 2016-03-04 ENCOUNTER — Emergency Department (HOSPITAL_BASED_OUTPATIENT_CLINIC_OR_DEPARTMENT_OTHER): Payer: Medicaid Other

## 2016-03-04 DIAGNOSIS — R3915 Urgency of urination: Secondary | ICD-10-CM | POA: Insufficient documentation

## 2016-03-04 DIAGNOSIS — R350 Frequency of micturition: Secondary | ICD-10-CM | POA: Insufficient documentation

## 2016-03-04 DIAGNOSIS — J45909 Unspecified asthma, uncomplicated: Secondary | ICD-10-CM | POA: Insufficient documentation

## 2016-03-04 DIAGNOSIS — R109 Unspecified abdominal pain: Secondary | ICD-10-CM

## 2016-03-04 LAB — URINALYSIS, ROUTINE W REFLEX MICROSCOPIC
BILIRUBIN URINE: NEGATIVE
Glucose, UA: NEGATIVE mg/dL
Ketones, ur: 15 mg/dL — AB
Nitrite: NEGATIVE
PROTEIN: NEGATIVE mg/dL
Specific Gravity, Urine: 1.024 (ref 1.005–1.030)
pH: 5.5 (ref 5.0–8.0)

## 2016-03-04 LAB — BASIC METABOLIC PANEL
Anion gap: 8 (ref 5–15)
BUN: 7 mg/dL (ref 6–20)
CALCIUM: 9.4 mg/dL (ref 8.9–10.3)
CO2: 25 mmol/L (ref 22–32)
Chloride: 105 mmol/L (ref 101–111)
Creatinine, Ser: 0.84 mg/dL (ref 0.44–1.00)
GFR calc Af Amer: >60 mL/min (ref >60)
Glucose, Bld: 111 mg/dL — ABNORMAL HIGH (ref 65–99)
Potassium: 3.2 mmol/L — ABNORMAL LOW (ref 3.5–5.1)
Sodium: 138 mmol/L (ref 135–145)

## 2016-03-04 LAB — CBC WITH DIFFERENTIAL/PLATELET
BASOS ABS: 0 10*3/uL (ref 0.0–0.1)
BASOS PCT: 0 %
EOS PCT: 1 %
Eosinophils Absolute: 0.1 10*3/uL (ref 0.0–0.7)
HCT: 39.3 % (ref 36.0–46.0)
Hemoglobin: 13.4 g/dL (ref 12.0–15.0)
Lymphocytes Relative: 40 %
Lymphs Abs: 2.9 10*3/uL (ref 0.7–4.0)
MCH: 30.4 pg (ref 26.0–34.0)
MCHC: 34.1 g/dL (ref 30.0–36.0)
MCV: 89.1 fL (ref 78.0–100.0)
MONO ABS: 0.5 10*3/uL (ref 0.1–1.0)
Monocytes Relative: 8 %
Neutro Abs: 3.7 10*3/uL (ref 1.7–7.7)
Neutrophils Relative %: 51 %
PLATELETS: 287 10*3/uL (ref 150–400)
RBC: 4.41 MIL/uL (ref 3.87–5.11)
RDW: 12.3 % (ref 11.5–15.5)
WBC: 7.2 10*3/uL (ref 4.0–10.5)

## 2016-03-04 LAB — URINE MICROSCOPIC-ADD ON

## 2016-03-04 LAB — PREGNANCY, URINE: PREG TEST UR: NEGATIVE

## 2016-03-04 MED ORDER — ONDANSETRON HCL 4 MG/2ML IJ SOLN
4.0000 mg | Freq: Once | INTRAMUSCULAR | Status: AC
  Administered 2016-03-04: 4 mg via INTRAVENOUS
  Filled 2016-03-04: qty 2

## 2016-03-04 MED ORDER — FENTANYL CITRATE (PF) 100 MCG/2ML IJ SOLN
100.0000 ug | Freq: Once | INTRAMUSCULAR | Status: AC
  Administered 2016-03-04: 100 ug via INTRAVENOUS
  Filled 2016-03-04: qty 2

## 2016-03-04 MED ORDER — KETOROLAC TROMETHAMINE 15 MG/ML IJ SOLN
15.0000 mg | Freq: Once | INTRAMUSCULAR | Status: AC
  Administered 2016-03-04: 15 mg via INTRAVENOUS
  Filled 2016-03-04: qty 1

## 2016-03-04 NOTE — ED Provider Notes (Signed)
CSN: [id number]     Arrival date & time 03/04/16  0037 History   First MD Initiated Contact with Patient 03/04/16 0105     Chief Complaint  Patient presents with  . Flank Pain     (Consider location/radiation/quality/duration/timing/severity/associated sxs/prior Treatment) HPI  This is a 30 year old female with a history of urinary tract infections and kidney stones. She is here with a one-week history of left flank pain. The pain was tolerable until yesterday when it became moderate to severe. It radiates to the left lower quadrant intermittently. There is associated malodorous urine, urinary urgency and urinary frequency. She denies burning with urination. She denies fever or chills. She has had nausea and vomiting. She also complains of headache and her hands and feet swelling.  Past Medical History  Diagnosis Date  . Lupus     questionable, no meds or treatment per pt  . Asthma   . Elevated blood sugar   . Kidney stones   . Ovarian cyst    Past Surgical History  Procedure Laterality Date  . Cesarean section     No family history on file. Social History  Substance Use Topics  . Smoking status: Never Smoker   . Smokeless tobacco: Never Used  . Alcohol Use: Yes     Comment: socially   OB History    Gravida Para Term Preterm AB TAB SAB Ectopic Multiple Living   3 1 1  0 1 0 1 0 0 1     Review of Systems  All other systems reviewed and are negative.   Allergies  Penicillins  Home Medications   Prior to Admission medications   Medication Sig Start Date End Date Taking? Authorizing Provider  Atorvastatin Calcium (LIPITOR PO) Take by mouth.    Historical Provider, MD  cyclopentolate (CYCLOGYL) 1 % ophthalmic solution Place 1 drop into the left eye daily. 05/03/14   Ripley Fraise, MD  HYDROcodone-acetaminophen (NORCO/VICODIN) 5-325 MG per tablet Take 2 tablets by mouth every 4 (four) hours as needed for moderate pain. 12/10/14   Orpah Greek, MD  naproxen  (NAPROSYN) 375 MG tablet Take 1 tablet (375 mg total) by mouth 2 (two) times daily. 04/25/14   Malvin Johns, MD  nitrofurantoin, macrocrystal-monohydrate, (MACROBID) 100 MG capsule Take 1 capsule (100 mg total) by mouth 2 (two) times daily. 12/10/14   Orpah Greek, MD  phenazopyridine (PYRIDIUM) 200 MG tablet Take 1 tablet (200 mg total) by mouth 3 (three) times daily. 12/10/14   Orpah Greek, MD   BP 119/80 mmHg  Pulse 99  Temp(Src) 97.7 F (36.5 C) (Oral)  Resp 18  Ht 5\' 8"  (1.727 m)  Wt 253 lb (114.76 kg)  BMI 38.48 kg/m2  SpO2 100%  LMP 01/11/2016   Physical Exam  General: Well-developed, obese female in no acute distress; appearance consistent with age of record HENT: normocephalic; atraumatic Eyes: pupils equal, round and reactive to light; extraocular muscles intact Neck: supple Heart: regular rate and rhythm Lungs: clear to auscultation bilaterally Abdomen: soft; obese; mild left suprapubic tenderness; bowel sounds present GU: Mild left CVA tenderness Extremities: No deformity; full range of motion; pulses normal Neurologic: Awake, alert and oriented; motor function intact in all extremities and symmetric; no facial droop Skin: Warm and dry Psychiatric: Normal mood and affect    ED Course  Procedures (including critical care time)   MDM  Nursing notes and vitals signs, including pulse oximetry, reviewed.  Summary of this visit's results, reviewed by myself:  Labs:  Results for orders placed or performed during the hospital encounter of 03/04/16 (from the past 24 hour(s))  Urinalysis, Routine w reflex microscopic (not at [facility])     Status: Abnormal   Collection Time: 03/03/16 11:55 PM  Result Value Ref Range   Color, Urine YELLOW YELLOW   APPearance CLOUDY (A) CLEAR   Specific Gravity, Urine 1.024 1.005 - 1.030   pH 5.5 5.0 - 8.0   Glucose, UA NEGATIVE NEGATIVE mg/dL   Hgb urine dipstick MODERATE (A) NEGATIVE   Bilirubin Urine NEGATIVE NEGATIVE    Ketones, ur 15 (A) NEGATIVE mg/dL   Protein, ur NEGATIVE NEGATIVE mg/dL   Nitrite NEGATIVE NEGATIVE   Leukocytes, UA SMALL (A) NEGATIVE  Pregnancy, urine     Status: None   Collection Time: 03/03/16 11:55 PM  Result Value Ref Range   Preg Test, Ur NEGATIVE NEGATIVE  Urine microscopic-add on     Status: Abnormal   Collection Time: 03/03/16 11:55 PM  Result Value Ref Range   Squamous Epithelial / LPF 6-30 (A) NONE SEEN   WBC, UA 0-5 0 - 5 WBC/hpf   RBC / HPF 0-5 0 - 5 RBC/hpf   Bacteria, UA MANY (A) NONE SEEN  Basic metabolic panel     Status: Abnormal   Collection Time: 03/04/16  1:15 AM  Result Value Ref Range   Sodium 138 135 - 145 mmol/L   Potassium 3.2 (L) 3.5 - 5.1 mmol/L   Chloride 105 101 - 111 mmol/L   CO2 25 22 - 32 mmol/L   Glucose, Bld 111 (H) 65 - 99 mg/dL   BUN 7 6 - 20 mg/dL   Creatinine, Ser 0.84 0.44 - 1.00 mg/dL   Calcium 9.4 8.9 - 10.3 mg/dL   GFR calc non Af Amer >60 >60 mL/min   GFR calc Af Amer >60 >60 mL/min   Anion gap 8 5 - 15  CBC with Differential/Platelet     Status: None   Collection Time: 03/04/16  1:15 AM  Result Value Ref Range   WBC 7.2 4.0 - 10.5 K/uL   RBC 4.41 3.87 - 5.11 MIL/uL   Hemoglobin 13.4 12.0 - 15.0 g/dL   HCT 39.3 36.0 - 46.0 %   MCV 89.1 78.0 - 100.0 fL   MCH 30.4 26.0 - 34.0 pg   MCHC 34.1 30.0 - 36.0 g/dL   RDW 12.3 11.5 - 15.5 %   Platelets 287 150 - 400 K/uL   Neutrophils Relative % 51 %   Neutro Abs 3.7 1.7 - 7.7 K/uL   Lymphocytes Relative 40 %   Lymphs Abs 2.9 0.7 - 4.0 K/uL   Monocytes Relative 8 %   Monocytes Absolute 0.5 0.1 - 1.0 K/uL   Eosinophils Relative 1 %   Eosinophils Absolute 0.1 0.0 - 0.7 K/uL   Basophils Relative 0 %   Basophils Absolute 0.0 0.0 - 0.1 K/uL    Imaging Studies: Ct Renal Stone Study  03/04/2016  CLINICAL DATA:  Initial evaluation for acute left flank pain. EXAM: CT ABDOMEN AND PELVIS WITHOUT CONTRAST TECHNIQUE: Multidetector CT imaging of the abdomen and pelvis was performed  following the standard protocol without IV contrast. COMPARISON:  Prior CT from 06/25/2014 FINDINGS: Mild scattered atelectatic changes seen dependently within the visualized lung bases. Visualized lungs are otherwise clear. Limited noncontrast evaluation of the liver is unremarkable. Gallbladder within normal limits. No biliary dilatation. Spleen, adrenal glands, and pancreas demonstrate a normal unenhanced appearance. Right kidney unremarkable without evidence  of nephrolithiasis or hydronephrosis. No radiopaque calculi seen along the course of the right renal collect system. No right-sided hydronephrosis. On the left, nonobstructive 5 mm stone within the upper pole left kidney. No left-sided hydronephrosis. No definite radiopaque calculi seen along the course of the left renal collecting system. There is no left-sided hydronephrosis. Stomach within normal limits. No evidence for bowel obstruction. Appendix normal. No abnormal wall thickening or inflammatory fat stranding seen about the bowels. Bladder partially distended without acute abnormality. Uterus and ovaries within normal limits. No free air or fluid.  No adenopathy. No acute osseous abnormality. No worrisome lytic or blastic osseous lesions. IMPRESSION: 1. 5 mm nonobstructive left renal calculus. No CT evidence for obstructive uropathy. 2. No other acute intra-abdominal or pelvic process. Electronically Signed   By: Jeannine Boga M.D.   On: 03/04/2016 03:01   3:07 AM Patient's pain is improved. She was advised of reassuring CT and urinalysis. Her pain may represent a stone that passed.    Shanon Rosser, MD 03/04/16 [phone]

## 2016-03-04 NOTE — ED Notes (Signed)
Flank pain x 1 week that has progressively worsened also has headache and reports  bilat hand and feet swelling

## 2016-03-04 NOTE — ED Notes (Signed)
Patient transported to CT 

## 2016-03-06 LAB — URINE CULTURE: Culture: >=100000 — AB

## 2016-03-07 ENCOUNTER — Telehealth (HOSPITAL_BASED_OUTPATIENT_CLINIC_OR_DEPARTMENT_OTHER): Payer: Self-pay

## 2016-03-19 ENCOUNTER — Emergency Department (HOSPITAL_BASED_OUTPATIENT_CLINIC_OR_DEPARTMENT_OTHER): Payer: Medicaid Other

## 2016-03-19 ENCOUNTER — Emergency Department (HOSPITAL_BASED_OUTPATIENT_CLINIC_OR_DEPARTMENT_OTHER)
Admission: EM | Admit: 2016-03-19 | Discharge: 2016-03-20 | Disposition: A | Discharge status: Home / Self Care | Payer: Medicaid Other | Attending: Emergency Medicine | Admitting: Emergency Medicine

## 2016-03-19 ENCOUNTER — Encounter (HOSPITAL_BASED_OUTPATIENT_CLINIC_OR_DEPARTMENT_OTHER): Payer: Self-pay

## 2016-03-19 DIAGNOSIS — R319 Hematuria, unspecified: Secondary | ICD-10-CM

## 2016-03-19 DIAGNOSIS — J45909 Unspecified asthma, uncomplicated: Secondary | ICD-10-CM | POA: Insufficient documentation

## 2016-03-19 DIAGNOSIS — J189 Pneumonia, unspecified organism: Secondary | ICD-10-CM | POA: Insufficient documentation

## 2016-03-19 LAB — URINALYSIS, ROUTINE W REFLEX MICROSCOPIC
BILIRUBIN URINE: NEGATIVE
GLUCOSE, UA: NEGATIVE mg/dL
KETONES UR: NEGATIVE mg/dL
LEUKOCYTES UA: NEGATIVE
Nitrite: NEGATIVE
PH: 6 (ref 5.0–8.0)
Protein, ur: NEGATIVE mg/dL
Specific Gravity, Urine: 1.026 (ref 1.005–1.030)

## 2016-03-19 LAB — COMPREHENSIVE METABOLIC PANEL
ALT: 26 U/L (ref 14–54)
AST: 23 U/L (ref 15–41)
Albumin: 3.9 g/dL (ref 3.5–5.0)
Alkaline Phosphatase: 77 U/L (ref 38–126)
Anion gap: 8 (ref 5–15)
BILIRUBIN TOTAL: 0.6 mg/dL (ref 0.3–1.2)
BUN: 9 mg/dL (ref 6–20)
CO2: 24 mmol/L (ref 22–32)
Calcium: 9 mg/dL (ref 8.9–10.3)
Chloride: 102 mmol/L (ref 101–111)
Creatinine, Ser: 0.74 mg/dL (ref 0.44–1.00)
Glucose, Bld: 92 mg/dL (ref 65–99)
POTASSIUM: 3.7 mmol/L (ref 3.5–5.1)
Sodium: 134 mmol/L — ABNORMAL LOW (ref 135–145)
TOTAL PROTEIN: 7.9 g/dL (ref 6.5–8.1)

## 2016-03-19 LAB — CBC WITH DIFFERENTIAL/PLATELET
BASOS ABS: 0 10*3/uL (ref 0.0–0.1)
Basophils Relative: 0 %
EOS ABS: 0 10*3/uL (ref 0.0–0.7)
Eosinophils Relative: 0 %
HCT: 39.7 % (ref 36.0–46.0)
Hemoglobin: 13.2 g/dL (ref 12.0–15.0)
LYMPHS PCT: 22 %
Lymphs Abs: 1.3 10*3/uL (ref 0.7–4.0)
MCH: 30.1 pg (ref 26.0–34.0)
MCHC: 33.2 g/dL (ref 30.0–36.0)
MCV: 90.4 fL (ref 78.0–100.0)
Monocytes Absolute: 0.6 10*3/uL (ref 0.1–1.0)
Monocytes Relative: 11 %
Neutro Abs: 4.1 10*3/uL (ref 1.7–7.7)
Neutrophils Relative %: 67 %
PLATELETS: 241 10*3/uL (ref 150–400)
RBC: 4.39 MIL/uL (ref 3.87–5.11)
RDW: 12.9 % (ref 11.5–15.5)
WBC: 6.1 10*3/uL (ref 4.0–10.5)

## 2016-03-19 LAB — URINE MICROSCOPIC-ADD ON

## 2016-03-19 LAB — PREGNANCY, URINE: Preg Test, Ur: NEGATIVE

## 2016-03-19 LAB — I-STAT CG4 LACTIC ACID, ED: LACTIC ACID, VENOUS: 0.76 mmol/L (ref 0.5–1.9)

## 2016-03-19 MED ORDER — IBUPROFEN 400 MG PO TABS
400.0000 mg | ORAL_TABLET | Freq: Once | ORAL | Status: AC
  Administered 2016-03-19: 400 mg via ORAL
  Filled 2016-03-19: qty 1

## 2016-03-19 MED ORDER — MORPHINE SULFATE (PF) 4 MG/ML IV SOLN
4.0000 mg | Freq: Once | INTRAVENOUS | Status: AC
  Administered 2016-03-19: 4 mg via INTRAVENOUS
  Filled 2016-03-19: qty 1

## 2016-03-19 MED ORDER — ONDANSETRON HCL 4 MG/2ML IJ SOLN
4.0000 mg | Freq: Once | INTRAMUSCULAR | Status: AC
  Administered 2016-03-19: 4 mg via INTRAVENOUS
  Filled 2016-03-19: qty 2

## 2016-03-19 MED ORDER — SODIUM CHLORIDE 0.9 % IV BOLUS (SEPSIS)
1000.0000 mL | Freq: Once | INTRAVENOUS | Status: AC
  Administered 2016-03-19: 1000 mL via INTRAVENOUS

## 2016-03-19 MED ORDER — AZITHROMYCIN 250 MG PO TABS: 250.0000 mg | ORAL_TABLET | Freq: Every day | ORAL | Status: DC

## 2016-03-19 MED ORDER — TRAMADOL HCL 50 MG PO TABS: 50.0000 mg | ORAL_TABLET | Freq: Four times a day (QID) | ORAL | Status: DC | PRN

## 2016-03-19 MED ORDER — AZITHROMYCIN 250 MG PO TABS
500.0000 mg | ORAL_TABLET | Freq: Once | ORAL | Status: AC
  Administered 2016-03-19: 500 mg via ORAL
  Filled 2016-03-19: qty 2

## 2016-03-19 NOTE — ED Notes (Signed)
Patient complaining of fever since yesterday. States she has been "fighting a UTI" for almost a year. She states she has been seen by her doctor and treated with several antibiotics with no relief. Reports burning and pain with urination as well as LLQ pain.

## 2016-03-19 NOTE — ED Notes (Signed)
C/o fever x 2 days-abd pain, back pain-recently tx for kidney infection-NAD-steady gait

## 2016-03-19 NOTE — ED Notes (Signed)
Pt transported to XR at this time.

## 2016-03-19 NOTE — ED Provider Notes (Signed)
CSN: [id number]     Arrival date & time 03/19/16  1652 History   First MD Initiated Contact with Patient 03/19/16 1724     Chief Complaint  Patient presents with  . Fever     (Consider location/radiation/quality/duration/timing/severity/associated sxs/prior Treatment) HPI Comments: Patient with a history of lupus and recurrent UTIs presents with left flank pain. She was seen here on July 6 with urinary symptoms and left flank pain. She states that she was started on antibiotics for UTI although I don't see this in the note. She states that she has been treated for recurrent UTIs over the last year. She was previously being seen in [place]. She did follow-up with a urologist who is concerned about possible lupus nephritis. Since she moved here, she hasn't established care and hasn't had any follow-up on that. She states that she has some pain on urination. She's also noted to have intermittent fevers. She has a little bit of URI symptoms and nonproductive cough. She's had associated nausea and vomiting. The symptoms have been going on for about the last week but worsening over last 2 days. She tells me that she's not had any imaging studies to evaluate for her ongoing left flank pain. She did say that she had an ultrasound in [place] about a year ago but hasn't had anything since that time.  Patient is a 30 y.o. female presenting with fever.  Fever Associated symptoms: congestion, cough, dysuria, nausea and vomiting   Associated symptoms: no chest pain, no chills, no diarrhea, no headaches, no rash and no rhinorrhea     Past Medical History  Diagnosis Date  . Lupus ([place])     questionable, no meds or treatment per pt  . Asthma   . Elevated blood sugar   . Kidney stones   . Ovarian cyst    Past Surgical History  Procedure Laterality Date  . Cesarean section     No family history on file. Social History  Substance Use Topics  . Smoking status: Never Smoker   . Smokeless tobacco:  Never Used  . Alcohol Use: Yes     Comment: socially   OB History    Gravida Para Term Preterm AB TAB SAB Ectopic Multiple Living   3 1 1  0 1 0 1 0 0 1     Review of Systems  Constitutional: Positive for fever. Negative for chills, diaphoresis and fatigue.  HENT: Positive for congestion. Negative for rhinorrhea and sneezing.   Eyes: Negative.   Respiratory: Positive for cough. Negative for chest tightness and shortness of breath.   Cardiovascular: Negative for chest pain and leg swelling.  Gastrointestinal: Positive for nausea, vomiting and abdominal pain. Negative for diarrhea and blood in stool.  Genitourinary: Positive for dysuria, urgency and flank pain. Negative for frequency, hematuria, difficulty urinating and pelvic pain.  Musculoskeletal: Negative for back pain and arthralgias.  Skin: Negative for rash.  Neurological: Negative for dizziness, speech difficulty, weakness, numbness and headaches.      Allergies  Penicillins  Home Medications   Prior to Admission medications   Medication Sig Start Date End Date Taking? Authorizing Provider  azithromycin (ZITHROMAX) 250 MG tablet Take 1 tablet (250 mg total) by mouth daily. Take  1 every day until finished. 03/19/16   Malvin Johns, MD  traMADol (ULTRAM) 50 MG tablet Take 1 tablet (50 mg total) by mouth every 6 (six) hours as needed. 03/19/16   Malvin Johns, MD   BP 94/47 mmHg  Pulse 88  Temp(Src) 98.4 F (36.9 C) (Oral)  Resp 18  Ht 5\' 9"  (1.753 m)  Wt 253 lb (114.76 kg)  BMI 37.34 kg/m2  SpO2 95%  LMP 01/11/2016 Physical Exam  Constitutional: She is oriented to person, place, and time. She appears well-developed and well-nourished.  HENT:  Head: Normocephalic and atraumatic.  Eyes: Pupils are equal, round, and reactive to light.  Neck: Normal range of motion. Neck supple.  Cardiovascular: Normal rate, regular rhythm and normal heart sounds.   Pulmonary/Chest: Effort normal and breath sounds normal. No  respiratory distress. She has no wheezes. She has no rales. She exhibits no tenderness.  Abdominal: Soft. Bowel sounds are normal. There is tenderness (Positive tenderness in left midabdomen and left flank). There is no rebound and no guarding.  Musculoskeletal: Normal range of motion. She exhibits no edema.  Lymphadenopathy:    She has no cervical adenopathy.  Neurological: She is alert and oriented to person, place, and time.  Skin: Skin is warm and dry. No rash noted.  Psychiatric: She has a normal mood and affect.    ED Course  Procedures (including critical care time) Labs Review Results for orders placed or performed during the hospital encounter of 03/19/16  Pregnancy, urine  Result Value Ref Range   Preg Test, Ur NEGATIVE NEGATIVE  Urinalysis, Routine w reflex microscopic (not at [facility])  Result Value Ref Range   Color, Urine YELLOW YELLOW   APPearance CLOUDY (A) CLEAR   Specific Gravity, Urine 1.026 1.005 - 1.030   pH 6.0 5.0 - 8.0   Glucose, UA NEGATIVE NEGATIVE mg/dL   Hgb urine dipstick LARGE (A) NEGATIVE   Bilirubin Urine NEGATIVE NEGATIVE   Ketones, ur NEGATIVE NEGATIVE mg/dL   Protein, ur NEGATIVE NEGATIVE mg/dL   Nitrite NEGATIVE NEGATIVE   Leukocytes, UA NEGATIVE NEGATIVE  Comprehensive metabolic panel  Result Value Ref Range   Sodium 134 (L) 135 - 145 mmol/L   Potassium 3.7 3.5 - 5.1 mmol/L   Chloride 102 101 - 111 mmol/L   CO2 24 22 - 32 mmol/L   Glucose, Bld 92 65 - 99 mg/dL   BUN 9 6 - 20 mg/dL   Creatinine, Ser 0.74 0.44 - 1.00 mg/dL   Calcium 9.0 8.9 - 10.3 mg/dL   Total Protein 7.9 6.5 - 8.1 g/dL   Albumin 3.9 3.5 - 5.0 g/dL   AST 23 15 - 41 U/L   ALT 26 14 - 54 U/L   Alkaline Phosphatase 77 38 - 126 U/L   Total Bilirubin 0.6 0.3 - 1.2 mg/dL   GFR calc non Af Amer >60 >60 mL/min   GFR calc Af Amer >60 >60 mL/min   Anion gap 8 5 - 15  CBC with Differential  Result Value Ref Range   WBC 6.1 4.0 - 10.5 K/uL   RBC 4.39 3.87 - 5.11 MIL/uL    Hemoglobin 13.2 12.0 - 15.0 g/dL   HCT 39.7 36.0 - 46.0 %   MCV 90.4 78.0 - 100.0 fL   MCH 30.1 26.0 - 34.0 pg   MCHC 33.2 30.0 - 36.0 g/dL   RDW 12.9 11.5 - 15.5 %   Platelets 241 150 - 400 K/uL   Neutrophils Relative % 67 %   Neutro Abs 4.1 1.7 - 7.7 K/uL   Lymphocytes Relative 22 %   Lymphs Abs 1.3 0.7 - 4.0 K/uL   Monocytes Relative 11 %   Monocytes Absolute 0.6 0.1 - 1.0 K/uL   Eosinophils  Relative 0 %   Eosinophils Absolute 0.0 0.0 - 0.7 K/uL   Basophils Relative 0 %   Basophils Absolute 0.0 0.0 - 0.1 K/uL  Urine microscopic-add on  Result Value Ref Range   Squamous Epithelial / LPF 0-5 (A) NONE SEEN   WBC, UA 0-5 0 - 5 WBC/hpf   RBC / HPF 6-30 0 - 5 RBC/hpf   Bacteria, UA MANY (A) NONE SEEN  I-Stat CG4 Lactic Acid, ED  Result Value Ref Range   Lactic Acid, Venous 0.76 0.5 - 1.9 mmol/L   Dg Chest 2 View  03/19/2016  CLINICAL DATA:  Pt co cough and fever since yesterday EXAM: CHEST  2 VIEW COMPARISON:  04/25/2014 FINDINGS: Minimal motion degraded lateral view. Midline trachea. Normal heart size and mediastinal contours. No pleural effusion or pneumothorax. Breast tissue overlies the lung bases on the frontal radiograph. There is subtle right upper lobe airspace disease. IMPRESSION: Right upper lobe airspace disease, suspicious for pneumonia. Electronically Signed   By: Abigail Miyamoto M.D.   On: 03/19/2016 19:42   Ct Renal Stone Study  03/19/2016  CLINICAL DATA:  Acute left flank pain for 1 month with nausea and vomiting. Hematuria. History of nephrolithiasis. EXAM: CT ABDOMEN AND PELVIS WITHOUT CONTRAST TECHNIQUE: Multidetector CT imaging of the abdomen and pelvis was performed following the standard protocol without IV contrast. COMPARISON:  03/04/2016 FINDINGS: Lower chest: Central bilateral lower lobe nodular airspace opacities about the bronchovascular structures, worse on the right concerning for bibasilar pneumonia. This is partially imaged in the lower chest. Normal heart  size. No pericardial or pleural effusion. Hepatobiliary: No mass visualized on this un-enhanced exam. Pancreas: No mass or inflammatory process identified on this un-enhanced exam. Spleen: Within normal limits in size. Adrenals/Urinary Tract: Normal adrenal glands. Stable punctate intrarenal calculus in left kidney midpole measuring 5 mm, image 26. otherwise the kidneys demonstrate no acute perinephric inflammatory process, hydronephrosis, obstructive uropathy, or obstructing ureteral calculus on either side. Urinary bladder collapsed Stomach/Bowel: Negative for bowel obstruction, significant dilatation, ileus, or free air. Normal appendix demonstrated. No fluid collection or abscess. Vascular/Lymphatic: No pathologically enlarged lymph nodes. No evidence of abdominal aortic aneurysm. Reproductive: Uterus and adnexa normal in size for age. Small right ovarian dominant follicle noted measuring 18 mm. No pelvic fluid collection or abscess. Other: No inguinal abnormality or hernia.  Intact abdominal wall. Musculoskeletal:  No acute osseous finding. IMPRESSION: Stable punctate nonobstructing 5 mm left intrarenal calculus. No acute obstructing ureteral calculus, hydronephrosis, or obstructive uropathy on either side. Interval development of bibasilar central nodular airspace opacities, worse on the right concerning for developing pneumonia. No other acute intra-abdominal or pelvic process. Electronically Signed   By: Jerilynn Mages.  Shick M.D.   On: 03/19/2016 18:06   Ct Renal Stone Study  03/04/2016  CLINICAL DATA:  Initial evaluation for acute left flank pain. EXAM: CT ABDOMEN AND PELVIS WITHOUT CONTRAST TECHNIQUE: Multidetector CT imaging of the abdomen and pelvis was performed following the standard protocol without IV contrast. COMPARISON:  Prior CT from 06/25/2014 FINDINGS: Mild scattered atelectatic changes seen dependently within the visualized lung bases. Visualized lungs are otherwise clear. Limited noncontrast  evaluation of the liver is unremarkable. Gallbladder within normal limits. No biliary dilatation. Spleen, adrenal glands, and pancreas demonstrate a normal unenhanced appearance. Right kidney unremarkable without evidence of nephrolithiasis or hydronephrosis. No radiopaque calculi seen along the course of the right renal collect system. No right-sided hydronephrosis. On the left, nonobstructive 5 mm stone within the upper pole left kidney.  No left-sided hydronephrosis. No definite radiopaque calculi seen along the course of the left renal collecting system. There is no left-sided hydronephrosis. Stomach within normal limits. No evidence for bowel obstruction. Appendix normal. No abnormal wall thickening or inflammatory fat stranding seen about the bowels. Bladder partially distended without acute abnormality. Uterus and ovaries within normal limits. No free air or fluid.  No adenopathy. No acute osseous abnormality. No worrisome lytic or blastic osseous lesions. IMPRESSION: 1. 5 mm nonobstructive left renal calculus. No CT evidence for obstructive uropathy. 2. No other acute intra-abdominal or pelvic process. Electronically Signed   By: Jeannine Boga M.D.   On: 03/04/2016 03:01      Imaging Review Dg Chest 2 View  03/19/2016  CLINICAL DATA:  Pt co cough and fever since yesterday EXAM: CHEST  2 VIEW COMPARISON:  04/25/2014 FINDINGS: Minimal motion degraded lateral view. Midline trachea. Normal heart size and mediastinal contours. No pleural effusion or pneumothorax. Breast tissue overlies the lung bases on the frontal radiograph. There is subtle right upper lobe airspace disease. IMPRESSION: Right upper lobe airspace disease, suspicious for pneumonia. Electronically Signed   By: Abigail Miyamoto M.D.   On: 03/19/2016 19:42   Ct Renal Stone Study  03/19/2016  CLINICAL DATA:  Acute left flank pain for 1 month with nausea and vomiting. Hematuria. History of nephrolithiasis. EXAM: CT ABDOMEN AND PELVIS  WITHOUT CONTRAST TECHNIQUE: Multidetector CT imaging of the abdomen and pelvis was performed following the standard protocol without IV contrast. COMPARISON:  03/04/2016 FINDINGS: Lower chest: Central bilateral lower lobe nodular airspace opacities about the bronchovascular structures, worse on the right concerning for bibasilar pneumonia. This is partially imaged in the lower chest. Normal heart size. No pericardial or pleural effusion. Hepatobiliary: No mass visualized on this un-enhanced exam. Pancreas: No mass or inflammatory process identified on this un-enhanced exam. Spleen: Within normal limits in size. Adrenals/Urinary Tract: Normal adrenal glands. Stable punctate intrarenal calculus in left kidney midpole measuring 5 mm, image 26. otherwise the kidneys demonstrate no acute perinephric inflammatory process, hydronephrosis, obstructive uropathy, or obstructing ureteral calculus on either side. Urinary bladder collapsed Stomach/Bowel: Negative for bowel obstruction, significant dilatation, ileus, or free air. Normal appendix demonstrated. No fluid collection or abscess. Vascular/Lymphatic: No pathologically enlarged lymph nodes. No evidence of abdominal aortic aneurysm. Reproductive: Uterus and adnexa normal in size for age. Small right ovarian dominant follicle noted measuring 18 mm. No pelvic fluid collection or abscess. Other: No inguinal abnormality or hernia.  Intact abdominal wall. Musculoskeletal:  No acute osseous finding. IMPRESSION: Stable punctate nonobstructing 5 mm left intrarenal calculus. No acute obstructing ureteral calculus, hydronephrosis, or obstructive uropathy on either side. Interval development of bibasilar central nodular airspace opacities, worse on the right concerning for developing pneumonia. No other acute intra-abdominal or pelvic process. Electronically Signed   By: Jerilynn Mages.  Shick M.D.   On: 03/19/2016 18:06   I have personally reviewed and evaluated these images and lab results  as part of my medical decision-making.   EKG Interpretation None      MDM   Final diagnoses:  CAP (community acquired pneumonia)  Hematuria    Patient presents with left flank pain associated with urinary symptoms, cough and fever. She initially told me she had not had any imaging studies for these symptoms. I did do a CT scan of her abdomen and pelvis to look for a stone. This was negative for any ureteral stones or other acute abnormalities.  On chart review, I did note that she  had a similar CT scan on her recent visit. When I asked her about this, she said that she forgot she had it.  Her urine shows blood but no other strong signs of infection. Her CT scan indicated a possible pneumonia. Given her cough, I did do a chest x-ray which shows evidence of pneumonia, which is likely community-acquired. She's otherwise well-appearing. Her tachycardia has resolved. Her fever has resolved. She is feeling much better. She has no ongoing vomiting. Her lactate is normal.  She was started on Zithromax. He was given strict return precautions.    Malvin Johns, MD 03/19/16 [phone]

## 2016-03-19 NOTE — ED Notes (Signed)
Pt transported to CT ?

## 2016-03-19 NOTE — ED Notes (Signed)
Pt reports increased pain, MD made aware at this time. Verbal orders received.

## 2016-03-19 NOTE — Discharge Instructions (Signed)
Community-Acquired Pneumonia, Adult Pneumonia is an infection of the lungs. One type of pneumonia can happen while a person is in a hospital. A different type can happen when a person is not in a hospital (community-acquired pneumonia). It is easy for this kind to spread from person to person. It can spread to you if you breathe near an infected person who coughs or sneezes. Some symptoms include:  A dry cough.  A wet (productive) cough.  Fever.  Sweating.  Chest pain. HOME CARE  Take over-the-counter and prescription medicines only as told by your doctor.  Only take cough medicine if you are losing sleep.  If you were prescribed an antibiotic medicine, take it as told by your doctor. Do not stop taking the antibiotic even if you start to feel better.  Sleep with your head and neck raised (elevated). You can do this by putting a few pillows under your head, or you can sleep in a recliner.  Do not use tobacco products. These include cigarettes, chewing tobacco, and e-cigarettes. If you need help quitting, ask your doctor.  Drink enough water to keep your pee (urine) clear or pale yellow. A shot (vaccine) can help prevent pneumonia. Shots are often suggested for:  People older than 28 years of age.  People older than 28 years of age:  Who are having cancer treatment.  Who have long-term (chronic) lung disease.  Who have problems with their body's defense system (immune system). You may also prevent pneumonia if you take these actions:  Get the flu (influenza) shot every year.  Go to the dentist as often as told.  Wash your hands often. If soap and water are not available, use hand sanitizer. GET HELP IF:  You have a fever.  You lose sleep because your cough medicine does not help. GET HELP RIGHT AWAY IF:  You are short of breath and it gets worse.  You have more chest pain.  Your sickness gets worse. This is very serious if:  You are an older adult.  Your  body's defense system is weak.  You cough up blood.   This information is not intended to replace advice given to you by your health care provider. Make sure you discuss any questions you have with your health care provider.   Document Released: 02/02/2008 Document Revised: 05/07/2015 Document Reviewed: 12/11/2014 Elsevier Interactive Patient Education 2016 Elsevier Inc.  Hematuria, Adult Hematuria is blood in your urine. It can be caused by a bladder infection, kidney infection, prostate infection, kidney stone, or cancer of your urinary tract. Infections can usually be treated with medicine, and a kidney stone usually will pass through your urine. If neither of these is the cause of your hematuria, further workup to find out the reason may be needed. It is very important that you tell your health care provider about any blood you see in your urine, even if the blood stops without treatment or happens without causing pain. Blood in your urine that happens and then stops and then happens again can be a symptom of a very serious condition. Also, pain is not a symptom in the initial stages of many urinary cancers. HOME CARE INSTRUCTIONS   Drink lots of fluid, 3-4 quarts a day. If you have been diagnosed with an infection, cranberry juice is especially recommended, in addition to large amounts of water.  Avoid caffeine, tea, and carbonated beverages because they tend to irritate the bladder.  Avoid alcohol because it may irritate the prostate.  Take all medicines as directed by your health care provider.  If you were prescribed an antibiotic medicine, finish it all even if you start to feel better.  If you have been diagnosed with a kidney stone, follow your health care provider's instructions regarding straining your urine to catch the stone.  Empty your bladder often. Avoid holding urine for long periods of time.  After a bowel movement, women should cleanse front to back. Use each tissue  only once.  Empty your bladder before and after sexual intercourse if you are a female. SEEK MEDICAL CARE IF:  You develop back pain.  You have a fever.  You have a feeling of sickness in your stomach (nausea) or vomiting.  Your symptoms are not better in 3 days. Return sooner if you are getting worse. SEEK IMMEDIATE MEDICAL CARE IF:   You develop severe vomiting and are unable to keep the medicine down.  You develop severe back or abdominal pain despite taking your medicines.  You begin passing a large amount of blood or clots in your urine.  You feel extremely weak or faint, or you pass out. MAKE SURE YOU:   Understand these instructions.  Will watch your condition.  Will get help right away if you are not doing well or get worse.   This information is not intended to replace advice given to you by your health care provider. Make sure you discuss any questions you have with your health care provider.   Document Released: 08/16/2005 Document Revised: 09/06/2014 Document Reviewed: 04/16/2013 Elsevier Interactive Patient Education [employer].

## 2016-03-22 LAB — URINE CULTURE: SPECIAL REQUESTS: NORMAL

## 2016-03-23 ENCOUNTER — Telehealth (HOSPITAL_BASED_OUTPATIENT_CLINIC_OR_DEPARTMENT_OTHER): Payer: Self-pay | Admitting: Emergency Medicine

## 2016-03-23 NOTE — Progress Notes (Signed)
ED Antimicrobial Stewardship Positive Culture Follow Up   Rebecca Day is an 30 y.o. female who presented to [facility] on 03/19/2016 with a chief complaint of  Chief Complaint  Patient presents with  . Fever    Recent Results (from the past 720 hour(s))  Urine culture     Status: Abnormal   Collection Time: 03/03/16 11:55 PM  Result Value Ref Range Status   Specimen Description URINE, CLEAN CATCH  Final   Special Requests NONE  Final   Culture >=100,000 COLONIES/mL CITROBACTER KOSERI (A)  Final   Report Status 03/06/2016 FINAL  Final   Organism ID, Bacteria CITROBACTER KOSERI (A)  Final      Susceptibility   Citrobacter koseri - MIC*    CEFAZOLIN <=4 SENSITIVE Sensitive     CEFTRIAXONE <=1 SENSITIVE Sensitive     CIPROFLOXACIN <=0.25 SENSITIVE Sensitive     GENTAMICIN <=1 SENSITIVE Sensitive     IMIPENEM <=0.25 SENSITIVE Sensitive     NITROFURANTOIN 32 SENSITIVE Sensitive     TRIMETH/SULFA <=20 SENSITIVE Sensitive     PIP/TAZO <=4 SENSITIVE Sensitive     * >=100,000 COLONIES/mL CITROBACTER KOSERI  Urine culture     Status: Abnormal   Collection Time: 03/19/16  5:00 PM  Result Value Ref Range Status   Specimen Description URINE, CLEAN CATCH  Final   Special Requests Normal  Final   Culture >=100,000 COLONIES/mL CITROBACTER KOSERI (A)  Final   Report Status 03/22/2016 FINAL  Final   Organism ID, Bacteria CITROBACTER KOSERI (A)  Final      Susceptibility   Citrobacter koseri - MIC*    CEFAZOLIN <=4 SENSITIVE Sensitive     CEFTRIAXONE <=1 SENSITIVE Sensitive     CIPROFLOXACIN <=0.25 SENSITIVE Sensitive     GENTAMICIN <=1 SENSITIVE Sensitive     IMIPENEM <=0.25 SENSITIVE Sensitive     NITROFURANTOIN 32 SENSITIVE Sensitive     TRIMETH/SULFA <=20 SENSITIVE Sensitive     PIP/TAZO <=4 SENSITIVE Sensitive     * >=100,000 COLONIES/mL CITROBACTER KOSERI    [x]  Patient discharged originally without antimicrobial agent and treatment is now indicated  New antibiotic  prescription: Bactrim DS - take 1 tab PO BID x 7 days.  She should also finish her Azithromycin prescription.  ED Provider: Montine Circle, PA-C   Norva Riffle 03/23/2016, 8:54 AM Infectious Diseases Pharmacist Phone# [phone]

## 2016-03-23 NOTE — Telephone Encounter (Signed)
Post ED Visit - Positive Culture Follow-up: Successful Patient Follow-Up  Culture assessed and recommendations reviewed by: []  Elenor Quinones, Pharm.D. []  Heide Guile, Pharm.D., BCPS []  Parks Neptune, Pharm.D. []  Alycia Rossetti, Pharm.D., BCPS []  [place], [place].D., BCPS, AAHIVP [x]  Legrand Como, Pharm.D., BCPS, AAHIVP []  Milus Glazier, Pharm.D. []  Stephens November, Pharm.D.  Positive urine culture  []  Patient discharged without antimicrobial prescription and treatment is now indicated [x]  Organism is resistant to prescribed ED discharge antimicrobial []  Patient with positive blood cultures  Changes discussed with ED provider: Marlon Pel PA New antibiotic prescription stop azithromycin ,start Bactrim DS 1 tab po bid x 7 days  Attempting to contact patient    Hazle Nordmann 03/23/2016, 11:55 AM

## 2016-03-27 ENCOUNTER — Telehealth (HOSPITAL_BASED_OUTPATIENT_CLINIC_OR_DEPARTMENT_OTHER): Payer: Self-pay

## 2016-03-27 NOTE — Telephone Encounter (Signed)
Post ED Visit - Positive Culture Follow-up: Successful Patient Follow-Up  Culture assessed and recommendations reviewed by: []  Elenor Quinones, Pharm.D. []  Heide Guile, Pharm.D., BCPS []  Parks Neptune, Pharm.D. []  Alycia Rossetti, Pharm.D., BCPS []  [place], Pharm.D., BCPS, AAHIVP [x]  Legrand Como, Pharm.D., BCPS, AAHIVP []  Milus Glazier, Pharm.D. []  Stephens November, [place].D.  Positive urine culture  []  Patient discharged without antimicrobial prescription and treatment is now indicated [x]  Organism is resistant to prescribed ED discharge antimicrobial []  Patient with positive blood cultures  Changes discussed with ED provider: [facility] New antibiotic prescription Bactrim DS 1 BIB x 7 days Called to [facility] [id number]  Contacted patient, date 03/27/2016, time 1659   Puneet Selden, [place]ynn Comment 03/27/2016, 4:58 PM

## 2016-10-02 ENCOUNTER — Emergency Department (HOSPITAL_COMMUNITY)
Admission: EM | Admit: 2016-10-02 | Discharge: 2016-10-02 | Disposition: A | Discharge status: Home / Self Care | Payer: Self-pay | Attending: Emergency Medicine | Admitting: Emergency Medicine

## 2016-10-02 ENCOUNTER — Emergency Department (HOSPITAL_COMMUNITY): Payer: Self-pay

## 2016-10-02 ENCOUNTER — Encounter (HOSPITAL_COMMUNITY): Payer: Self-pay

## 2016-10-02 DIAGNOSIS — Z79899 Other long term (current) drug therapy: Secondary | ICD-10-CM | POA: Insufficient documentation

## 2016-10-02 DIAGNOSIS — M329 Systemic lupus erythematosus, unspecified: Secondary | ICD-10-CM | POA: Insufficient documentation

## 2016-10-02 DIAGNOSIS — J45909 Unspecified asthma, uncomplicated: Secondary | ICD-10-CM | POA: Insufficient documentation

## 2016-10-02 LAB — CBC WITH DIFFERENTIAL/PLATELET
BASOS ABS: 0 10*3/uL (ref 0.0–0.1)
BASOS PCT: 0 %
EOS ABS: 0.1 10*3/uL (ref 0.0–0.7)
EOS PCT: 1 %
HCT: 39.8 % (ref 36.0–46.0)
Hemoglobin: 13.2 g/dL (ref 12.0–15.0)
Lymphocytes Relative: 37 %
Lymphs Abs: 3.7 10*3/uL (ref 0.7–4.0)
MCH: 29.6 pg (ref 26.0–34.0)
MCHC: 33.2 g/dL (ref 30.0–36.0)
MCV: 89.2 fL (ref 78.0–100.0)
MONO ABS: 0.5 10*3/uL (ref 0.1–1.0)
MONOS PCT: 6 %
Neutro Abs: 5.5 10*3/uL (ref 1.7–7.7)
Neutrophils Relative %: 56 %
PLATELETS: 252 10*3/uL (ref 150–400)
RBC: 4.46 MIL/uL (ref 3.87–5.11)
RDW: 12.4 % (ref 11.5–15.5)
WBC: 9.8 10*3/uL (ref 4.0–10.5)

## 2016-10-02 LAB — URINALYSIS, ROUTINE W REFLEX MICROSCOPIC
BILIRUBIN URINE: NEGATIVE
GLUCOSE, UA: NEGATIVE mg/dL
KETONES UR: NEGATIVE mg/dL
Nitrite: NEGATIVE
PH: 5 (ref 5.0–8.0)
PROTEIN: NEGATIVE mg/dL
Specific Gravity, Urine: 1.021 (ref 1.005–1.030)

## 2016-10-02 LAB — COMPREHENSIVE METABOLIC PANEL
ALBUMIN: 3.7 g/dL (ref 3.5–5.0)
ALT: 15 U/L (ref 14–54)
ANION GAP: 12 (ref 5–15)
AST: 17 U/L (ref 15–41)
Alkaline Phosphatase: 74 U/L (ref 38–126)
BUN: 7 mg/dL (ref 6–20)
CHLORIDE: 105 mmol/L (ref 101–111)
CO2: 19 mmol/L — ABNORMAL LOW (ref 22–32)
Calcium: 9 mg/dL (ref 8.9–10.3)
Creatinine, Ser: 0.7 mg/dL (ref 0.44–1.00)
GFR calc Af Amer: >60 mL/min (ref >60)
GFR calc non Af Amer: >60 mL/min (ref >60)
GLUCOSE: 76 mg/dL (ref 65–99)
POTASSIUM: 3.8 mmol/L (ref 3.5–5.1)
Sodium: 136 mmol/L (ref 135–145)
TOTAL PROTEIN: 6.8 g/dL (ref 6.5–8.1)
Total Bilirubin: 0.5 mg/dL (ref 0.3–1.2)

## 2016-10-02 LAB — POC URINE PREG, ED: Preg Test, Ur: NEGATIVE

## 2016-10-02 MED ORDER — METHYLPREDNISOLONE SODIUM SUCC 125 MG IJ SOLR
125.0000 mg | Freq: Once | INTRAMUSCULAR | Status: AC
  Administered 2016-10-02: 125 mg via INTRAVENOUS
  Filled 2016-10-02: qty 2

## 2016-10-02 MED ORDER — ONDANSETRON HCL 4 MG/2ML IJ SOLN
4.0000 mg | Freq: Once | INTRAMUSCULAR | Status: AC
  Administered 2016-10-02: 4 mg via INTRAVENOUS
  Filled 2016-10-02: qty 2

## 2016-10-02 MED ORDER — SODIUM CHLORIDE 0.9 % IV BOLUS (SEPSIS)
1000.0000 mL | Freq: Once | INTRAVENOUS | Status: AC
  Administered 2016-10-02: 1000 mL via INTRAVENOUS

## 2016-10-02 MED ORDER — ACETAMINOPHEN 500 MG PO TABS
1000.0000 mg | ORAL_TABLET | Freq: Once | ORAL | Status: AC
  Administered 2016-10-02: 1000 mg via ORAL
  Filled 2016-10-02: qty 2

## 2016-10-02 MED ORDER — PREDNISONE 20 MG PO TABS: 40.0000 mg | ORAL_TABLET | Freq: Every day | ORAL | 0 refills | Status: DC

## 2016-10-02 NOTE — ED Provider Notes (Signed)
[facility] DEPT Provider Note   CSN: [id number] Arrival date & time: 10/02/16  1542     History   Chief Complaint Chief Complaint  Patient presents with  . Shortness of Breath  . Leg Swelling    HPI Rebecca Day is a 31 y.o. female.  HPI  The patient is a 31 year old female who has a history of asthma, lupus as well as a prior history of pulmonary embolism after she had a C-section 8 years ago post delivery. She no longer takes anticoagulants and has done very well over the last 8 years. She reports that over the last week she has had some progressive symptoms including some joint aches of her bilateral wrists, some swelling of her bilateral hands and feet, she has had some shortness of breath with exertion but states that when she is resting she does not have that same dyspnea. There is no orthopnea, no chest discomfort, no abdominal discomfort. She does report that she has had nausea and vomiting and has been not able to hold any food down for the last couple of days. There is no associated diarrhea, no recent travel, no sick contacts. She has had no medications prior to arrival. She reports that she has several flareups of these types of symptoms every year, her last flareup was several months ago. She believes that this is a Lupus flare. The patient has recently moved back from [place] and does not have a local family doctor. She reports that she is under stress as her husband is in the hospital with a sickle cell flare and her daughter just came home from the hospital as well.  Past Medical History:  Diagnosis Date  . Asthma   . Elevated blood sugar   . Kidney stones   . Lupus    questionable, no meds or treatment per pt  . Ovarian cyst     Patient Active Problem List   Diagnosis Date Noted  . Family history of spina bifida 07/06/2011    Past Surgical History:  Procedure Laterality Date  . CESAREAN SECTION      OB History    Gravida Para Term Preterm AB Living     3 1 1  0 1 1   SAB TAB Ectopic Multiple Live Births   1 0 0 0         Home Medications    Prior to Admission medications   Medication Sig Start Date End Date Taking? Authorizing Provider  azithromycin (ZITHROMAX) 250 MG tablet Take 1 tablet (250 mg total) by mouth daily. Take  1 every day until finished. Patient not taking: Reported on 10/02/2016 03/19/16   Malvin Johns, MD  traMADol (ULTRAM) 50 MG tablet Take 1 tablet (50 mg total) by mouth every 6 (six) hours as needed. Patient not taking: Reported on 10/02/2016 03/19/16   Malvin Johns, MD    Family History History reviewed. No pertinent family history.  Social History Social History  Substance Use Topics  . Smoking status: Never Smoker  . Smokeless tobacco: Never Used  . Alcohol use Yes     Comment: socially     Allergies   Penicillins   Review of Systems Review of Systems  All other systems reviewed and are negative.    Physical Exam Updated Vital Signs BP 105/68   Pulse 83   Temp 98.7 F (37.1 C) (Oral)   Resp 16   LMP 09/12/2016   SpO2 100%   Physical Exam  Constitutional: She appears  well-developed and well-nourished. No distress.  HENT:  Head: Normocephalic and atraumatic.  Mouth/Throat: Oropharynx is clear and moist. No oropharyngeal exudate.  The mucous membranes are normal appearance  Eyes: Conjunctivae and EOM are normal. Pupils are equal, round, and reactive to light. Right eye exhibits no discharge. Left eye exhibits no discharge. No scleral icterus.  Neck: Normal range of motion. Neck supple. No JVD present. No thyromegaly present.  Cardiovascular: Normal rate, regular rhythm, normal heart sounds and intact distal pulses.  Exam reveals no gallop and no friction rub.   No murmur heard. The patient has strong radial artery pulses bilaterally with a heart rate of 80, no JVD or peripheral edema.  Pulmonary/Chest: Effort normal and breath sounds normal. No respiratory distress. She has no wheezes.  She has no rales.  The patient speaks in full sentences with no distress, she is able to very clearly tell me his entire 5 minute history without having any increased work of breathing tachypnea or having to rest breathe. There is no accessory muscle use, no wheezing rales or rhonchi.  Abdominal: Soft. Bowel sounds are normal. She exhibits no distension and no mass. There is no tenderness.  Very soft nontender abdomen with no hepatosplenomegaly  Musculoskeletal: Normal range of motion. She exhibits no edema or tenderness.  There is the appearance of mild swelling of the bilateral hands and feet however the patient is obese and there is no asymmetry or edema. Supple joints diffusely, soft compartments diffusely  Lymphadenopathy:    She has no cervical adenopathy.  Neurological: She is alert. Coordination normal.  Skin: Skin is warm and dry. No rash noted. No erythema.  There is no redness or rash to the skin  Psychiatric: She has a normal mood and affect. Her behavior is normal.  Nursing note and vitals reviewed.    ED Treatments / Results  Labs (all labs ordered are listed, but only abnormal results are displayed) Labs Reviewed  COMPREHENSIVE METABOLIC PANEL - Abnormal; Notable for the following:       Result Value   CO2 19 (*)    All other components within normal limits  URINALYSIS, ROUTINE W REFLEX MICROSCOPIC - Abnormal; Notable for the following:    APPearance HAZY (*)    Hgb urine dipstick MODERATE (*)    Leukocytes, UA TRACE (*)    Bacteria, UA MANY (*)    Squamous Epithelial / LPF 0-5 (*)    All other components within normal limits  URINE CULTURE  CBC WITH DIFFERENTIAL/PLATELET  POC URINE PREG, ED    Radiology Dg Chest 2 View  Result Date: 10/02/2016 CLINICAL DATA:  Onset 5-6 days pt has had shortness of breath when walking distances and swelling to feet and hands. Pt states it is a lupus flare up. EXAM: CHEST  2 VIEW COMPARISON:  03/19/2016 FINDINGS: Cardiomediastinal  silhouette is within normal limits. Mildly prominent interstitial markings are present. No focal consolidations or pleural effusions are identified. No pulmonary edema. IMPRESSION: No evidence for acute  abnormality. Electronically Signed   By: Nolon Nations M.D.   On: 10/02/2016 18:02    Procedures Procedures (including critical care time)  Medications Ordered in ED Medications  sodium chloride 0.9 % bolus 1,000 mL (1,000 mLs Intravenous New Bag/Given 10/02/16 1846)  methylPREDNISolone sodium succinate (SOLU-MEDROL) 125 mg/2 mL injection 125 mg (125 mg Intravenous Given 10/02/16 1848)  ondansetron (ZOFRAN) injection 4 mg (4 mg Intravenous Given 10/02/16 1846)  acetaminophen (TYLENOL) tablet 1,000 mg (1,000 mg Oral Given  10/02/16 1925)     Initial Impression / Assessment and Plan / ED Course  I have reviewed the triage vital signs and the nursing notes.  Pertinent labs & imaging results that were available during my care of the patient were reviewed by me and considered in my medical decision making (see chart for details).     That dyspnea and swelling of the joints is likely related to lupus, she does not appear ill at all, she has normal vital signs include a blood pressure oxygenation of 100% and a heart rate of 80. The patient does have lupus which would predispose her to pulmonary embolism that would not account for the swelling of bilateral hands and feet and with her normal vital signs do not think it is necessary to pursue this diagnosis. She will undergo chest x-ray and get some blood work, she will receive IV fluids and Zofran as well as some Solu-Medrol. She reports she last had difficulty with prednisone 80 mg stating that it made her feel poorly but she has been on prednisone in the past at lower doses and did not have the same side effects.  The CXR is normal - UA shows some bacteria - pt states she has been treated for months for UTI's of some kind or another and states she has no  sx at this time - will culture as she is asymptomatic - no leukocytosis, non pregnant, normal renal function - pt needs referral to local PCP - 1 week of prednisone - pt in agreement after discussion of results.  Final Clinical Impressions(s) / ED Diagnoses   Final diagnoses:  Exacerbation of systemic lupus ([address])    New Prescriptions New Prescriptions   No medications on file     Noemi Chapel, MD 10/02/16 2002

## 2016-10-02 NOTE — ED Notes (Signed)
This RN attempted to get an IV and was unsuccessful. IV team consulted.

## 2016-10-02 NOTE — ED Notes (Signed)
Pt ambulatory to the restroom.  

## 2016-10-02 NOTE — ED Notes (Signed)
Pt returned from xray and connected to the monitor 

## 2016-10-02 NOTE — ED Notes (Signed)
IV team at bedside 

## 2016-10-02 NOTE — ED Triage Notes (Signed)
Onset 5-6 days pt has had shortness of breath when walking distances and swelling to feet and hands.  Talking in complete sentences.  NAD at triage.

## 2016-10-02 NOTE — Discharge Instructions (Signed)
Prednisone daily as prescribed Return to the ER for worsening symptoms You MUST be seen in next 7 days for follow up especially if you are not improving

## 2016-10-05 LAB — URINE CULTURE: Culture: >=100000 — AB

## 2016-10-06 ENCOUNTER — Telehealth: Payer: Self-pay | Admitting: Emergency Medicine

## 2016-10-06 NOTE — Telephone Encounter (Signed)
Post ED Visit - Positive Culture Follow-up  Culture report reviewed by antimicrobial stewardship pharmacist:  []  Elenor Quinones, Pharm.D. []  Heide Guile, Pharm.D., BCPS []  Parks Neptune, Pharm.D. []  Alycia Rossetti, Pharm.D., BCPS []  [place], Pharm.D., BCPS, AAHIVP []  Legrand Como, Pharm.D., BCPS, AAHIVP []  Milus Glazier, Pharm.D. []  Stephens November, Pharm.D. Joe Arminger PharmD  Positive urine culture Treated with none, asymptomatic, organism sensitive to the same and no further patient follow-up is required at this time.  Hazle Nordmann 10/06/2016, 1:44 PM

## 2016-10-09 ENCOUNTER — Emergency Department (HOSPITAL_BASED_OUTPATIENT_CLINIC_OR_DEPARTMENT_OTHER)
Admission: EM | Admit: 2016-10-09 | Discharge: 2016-10-09 | Disposition: A | Discharge status: Home / Self Care | Payer: Medicaid Other | Attending: Emergency Medicine | Admitting: Emergency Medicine

## 2016-10-09 ENCOUNTER — Encounter (HOSPITAL_BASED_OUTPATIENT_CLINIC_OR_DEPARTMENT_OTHER): Payer: Self-pay

## 2016-10-09 DIAGNOSIS — M545 Low back pain, unspecified: Secondary | ICD-10-CM

## 2016-10-09 DIAGNOSIS — N10 Acute tubulo-interstitial nephritis: Secondary | ICD-10-CM

## 2016-10-09 DIAGNOSIS — J45909 Unspecified asthma, uncomplicated: Secondary | ICD-10-CM | POA: Insufficient documentation

## 2016-10-09 HISTORY — DX: Glomerular disease in systemic lupus erythematosus: M32.14

## 2016-10-09 LAB — URINALYSIS, ROUTINE W REFLEX MICROSCOPIC
BILIRUBIN URINE: NEGATIVE
GLUCOSE, UA: NEGATIVE mg/dL
Ketones, ur: NEGATIVE mg/dL
Leukocytes, UA: NEGATIVE
Nitrite: NEGATIVE
PH: 6.5 (ref 5.0–8.0)
Protein, ur: NEGATIVE mg/dL
SPECIFIC GRAVITY, URINE: 1.024 (ref 1.005–1.030)

## 2016-10-09 LAB — URINALYSIS, MICROSCOPIC (REFLEX)

## 2016-10-09 LAB — PREGNANCY, URINE: Preg Test, Ur: NEGATIVE

## 2016-10-09 MED ORDER — CYCLOBENZAPRINE HCL 10 MG PO TABS
10.0000 mg | ORAL_TABLET | Freq: Once | ORAL | Status: AC
  Administered 2016-10-09: 10 mg via ORAL
  Filled 2016-10-09: qty 1

## 2016-10-09 MED ORDER — KETOROLAC TROMETHAMINE 60 MG/2ML IM SOLN
60.0000 mg | Freq: Once | INTRAMUSCULAR | Status: AC
  Administered 2016-10-09: 60 mg via INTRAMUSCULAR
  Filled 2016-10-09: qty 2

## 2016-10-09 MED ORDER — OXYCODONE-ACETAMINOPHEN 5-325 MG PO TABS
1.0000 | ORAL_TABLET | Freq: Once | ORAL | Status: AC
  Administered 2016-10-09: 1 via ORAL
  Filled 2016-10-09: qty 1

## 2016-10-09 MED ORDER — CYCLOBENZAPRINE HCL 10 MG PO TABS: 10.0000 mg | ORAL_TABLET | Freq: Two times a day (BID) | ORAL | 0 refills | Status: DC | PRN

## 2016-10-09 MED ORDER — CEPHALEXIN 500 MG PO CAPS: 500.0000 mg | ORAL_CAPSULE | Freq: Three times a day (TID) | ORAL | 0 refills | Status: DC

## 2016-10-09 MED ORDER — OXYCODONE-ACETAMINOPHEN 5-325 MG PO TABS: 1.0000 | ORAL_TABLET | ORAL | 0 refills | Status: DC | PRN

## 2016-10-09 MED ORDER — CEPHALEXIN 500 MG PO CAPS: 500.0000 mg | ORAL_CAPSULE | Freq: Three times a day (TID) | ORAL | 0 refills | Status: AC

## 2016-10-09 MED ORDER — ONDANSETRON 4 MG PO TBDP: 4.0000 mg | ORAL_TABLET | Freq: Three times a day (TID) | ORAL | 0 refills | Status: DC | PRN

## 2016-10-09 NOTE — Care Management Note (Signed)
Case Management Note  Patient Details  Name: Rebecca Day MRN: [id number] Date of Birth: 11/02/1985  Subjective/Objective:   31 y.o. Seen in the [facility]. CM received call that pt needed referral to [facility] for follow up for EDV. Spoke with pt to give her directions to clinic as well as confirm her Medicaid status which seems to be Family Planning? Pt understands she is to go to clinic on Thursday 10/14/2016 for Open Clinic Hours.                 Action/Plan: No further CM needs at this time.    Expected Discharge Date:                  Expected Discharge Plan:  Home/Self Care  In-House Referral:  NA  Discharge planning Services  CM Consult  Post Acute Care Choice:  NA Choice offered to:  Patient  DME Arranged:  N/A DME Agency:  NA  HH Arranged:  NA HH Agency:  NA  Status of Service:  Completed, signed off  If discussed at [place] of Stay Meetings, dates discussed:    Additional Comments:  Delrae Sawyers, RN 10/09/2016, 8:57 AM
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
## 2016-11-18 ENCOUNTER — Emergency Department (HOSPITAL_COMMUNITY): Payer: Self-pay

## 2016-11-18 ENCOUNTER — Emergency Department (HOSPITAL_COMMUNITY)
Admission: EM | Admit: 2016-11-18 | Discharge: 2016-11-18 | Disposition: A | Discharge status: Home / Self Care | Payer: Self-pay | Attending: Emergency Medicine | Admitting: Emergency Medicine

## 2016-11-18 ENCOUNTER — Encounter (HOSPITAL_COMMUNITY): Payer: Self-pay | Admitting: Emergency Medicine

## 2016-11-18 DIAGNOSIS — R1032 Left lower quadrant pain: Secondary | ICD-10-CM | POA: Insufficient documentation

## 2016-11-18 DIAGNOSIS — J45909 Unspecified asthma, uncomplicated: Secondary | ICD-10-CM | POA: Insufficient documentation

## 2016-11-18 DIAGNOSIS — O9989 Other specified diseases and conditions complicating pregnancy, childbirth and the puerperium: Secondary | ICD-10-CM | POA: Insufficient documentation

## 2016-11-18 DIAGNOSIS — R109 Unspecified abdominal pain: Secondary | ICD-10-CM

## 2016-11-18 DIAGNOSIS — Z3A01 Less than 8 weeks gestation of pregnancy: Secondary | ICD-10-CM | POA: Insufficient documentation

## 2016-11-18 LAB — BASIC METABOLIC PANEL
Anion gap: 8 (ref 5–15)
BUN: 9 mg/dL (ref 6–20)
CALCIUM: 9.2 mg/dL (ref 8.9–10.3)
CO2: 21 mmol/L — ABNORMAL LOW (ref 22–32)
CREATININE: 0.69 mg/dL (ref 0.44–1.00)
Chloride: 109 mmol/L (ref 101–111)
GFR calc Af Amer: >60 mL/min (ref >60)
GFR calc non Af Amer: >60 mL/min (ref >60)
GLUCOSE: 93 mg/dL (ref 65–99)
Potassium: 3.7 mmol/L (ref 3.5–5.1)
Sodium: 138 mmol/L (ref 135–145)

## 2016-11-18 LAB — URINALYSIS, ROUTINE W REFLEX MICROSCOPIC
BILIRUBIN URINE: NEGATIVE
Glucose, UA: NEGATIVE mg/dL
KETONES UR: NEGATIVE mg/dL
LEUKOCYTES UA: NEGATIVE
NITRITE: NEGATIVE
Protein, ur: NEGATIVE mg/dL
SPECIFIC GRAVITY, URINE: 1.026 (ref 1.005–1.030)
pH: 5 (ref 5.0–8.0)

## 2016-11-18 LAB — CBC WITH DIFFERENTIAL/PLATELET
BASOS ABS: 0 10*3/uL (ref 0.0–0.1)
Basophils Relative: 0 %
EOS PCT: 1 %
Eosinophils Absolute: 0.1 10*3/uL (ref 0.0–0.7)
HCT: 38.1 % (ref 36.0–46.0)
Hemoglobin: 12.6 g/dL (ref 12.0–15.0)
LYMPHS PCT: 36 %
Lymphs Abs: 2.9 10*3/uL (ref 0.7–4.0)
MCH: 29.4 pg (ref 26.0–34.0)
MCHC: 33.1 g/dL (ref 30.0–36.0)
MCV: 88.8 fL (ref 78.0–100.0)
MONO ABS: 0.4 10*3/uL (ref 0.1–1.0)
MONOS PCT: 5 %
NEUTROS ABS: 4.5 10*3/uL (ref 1.7–7.7)
Neutrophils Relative %: 58 %
Platelets: 277 10*3/uL (ref 150–400)
RBC: 4.29 MIL/uL (ref 3.87–5.11)
RDW: 12.8 % (ref 11.5–15.5)
WBC: 8 10*3/uL (ref 4.0–10.5)

## 2016-11-18 LAB — I-STAT BETA HCG BLOOD, ED (MC, WL, AP ONLY): I-stat hCG, quantitative: 57.3 m[IU]/mL — ABNORMAL HIGH (ref <5)

## 2016-11-18 LAB — WET PREP, GENITAL
Sperm: NONE SEEN
Trich, Wet Prep: NONE SEEN
Yeast Wet Prep HPF POC: NONE SEEN

## 2016-11-18 MED ORDER — PROMETHAZINE HCL 12.5 MG PO TABS: 12.5000 mg | ORAL_TABLET | Freq: Four times a day (QID) | ORAL | 0 refills | Status: DC | PRN

## 2016-11-18 MED ORDER — PHENAZOPYRIDINE HCL 200 MG PO TABS: 200.0000 mg | ORAL_TABLET | Freq: Three times a day (TID) | ORAL | 0 refills | Status: DC

## 2016-11-18 NOTE — ED Triage Notes (Signed)
Pt. reports left flank pain onset this week with mild dysuria ,denies hematuria or injury , pt. suspects recurring UTI , denies fever or chills.

## 2016-11-18 NOTE — Discharge Instructions (Signed)
Go to [facility] hospital Sunday morning to Maternity Admissions to have the pregnancy hormone level repeated to be sure there is a normal rise. We have sent your urine for culture. If you need additional medication someone will call you.

## 2016-11-18 NOTE — ED Provider Notes (Signed)
Re[facility] DEPT Provider Note   CSN: [id number] Arrival date & time: 11/18/16  1900  By signing my name below, I, [name], attest that this documentation has been prepared under the direction and in the presence of [name], NP. Electronically Signed: [name], Scribe. 11/18/16. 8:47 PM.  History   Chief Complaint Chief Complaint  Patient presents with  . Flank Pain   HPI Comments: Rebecca Day is a 31 y.o. female with a history of recurrent UTI's, kidney stones, and an ovarian cyst, who presents to the Emergency Department complaining of sudden-onset, constant left flank pain that began one week ago. Patient states she has had a urinary tract infections off and on for a "year" now, in which she has been treated with multiple antibiotics. Patient reports associated dysuria, nausea, urinary frequency, urgency, and vomiting. Patient reports taking bactrim, keflex, and ciprofloxacin. Patient reports being pregnant 5 times (2 living and 3 miscarriages). Patient states the last time she was treated for a UTI was one month ago. Patient reports being with the same sexual partner for 3 years. Patient denies any history of STD's or a tubal pregnancy. LNMP was 3 weeks ago. Patient denies any diarrhea, fever, chills, vaginal discharge, or vaginal bleeding. Patient states she has had nausea and vomiting but contributes that to her Lupus. LMP Feb but hx of irregular menses. She is using no birth control.  The history is provided by the patient. No language interpreter was used.    Past Medical History:  Diagnosis Date  . Asthma   . Elevated blood sugar   . Kidney stones   . Lupus    questionable, no meds or treatment per pt  . 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[place])   . Ovarian cyst     Patient Active Problem List   Diagnosis Date Noted  . Family history of spina bifida 07/06/2011    Past Surgical History:  Procedure Laterality Date  . CESAREAN SECTION      OB History    Gravida Para  Term Preterm AB Living   3 1 1  0 1 1   SAB TAB Ectopic Multiple Live Births   1 0 0 0         Home Medications    Prior to Admission medications   Medication Sig Start Date End Date Taking? Authorizing Provider  lidocaine (ASPERCREME W/LIDOCAINE) 4 % cream Apply 1 application topically 2 (two) times daily as needed (for pain).   Yes Historical Provider, MD  phenazopyridine (PYRIDIUM) 200 MG tablet Take 1 tablet (200 mg total) by mouth 3 (three) times daily. 11/18/16   Citlally Captain Bunnie Pion, NP  promethazine (PHENERGAN) 12.5 MG tablet Take 1 tablet (12.5 mg total) by mouth every 6 (six) hours as needed for nausea or vomiting. 11/18/16   Ashmi Blas Bunnie Pion, NP    Family History No family history on file.  Social History Social History  Substance Use Topics  . Smoking status: Never Smoker  . Smokeless tobacco: Never Used  . Alcohol use Yes     Comment: socially     Allergies   Penicillins   Review of Systems Review of Systems  Constitutional: Negative for chills and fever.  Gastrointestinal: Positive for nausea and vomiting. Negative for abdominal pain.  Genitourinary: Positive for dysuria, flank pain and urgency. Negative for hematuria, vaginal bleeding and vaginal discharge.     Physical Exam Updated Vital Signs BP (!) 147/76 (BP Location: Right Arm)   Pulse 77   Temp 99 F (  37.2 C) (Oral)   Resp (!) 22   Ht 5\' 9"  (1.753 m)   Wt 130.6 kg   LMP 10/06/2016 (Approximate)   SpO2 99%   BMI 42.53 kg/m   Physical Exam  Constitutional: She is oriented to person, place, and time. She appears well-developed and well-nourished. No distress.  Eyes: EOM are normal.  Neck: Normal range of motion. Neck supple.  Cardiovascular: Normal rate and regular rhythm.   Pulmonary/Chest: Effort normal and breath sounds normal.  Abdominal: Soft. Bowel sounds are normal. There is tenderness. There is no rebound and no guarding.  LLQ tenderness. No CVA tenderness.   Genitourinary:  Genitourinary  Comments: External genitalia without lesions, mucous d/c vaginal vault. No CMT, no adnexal tenderness, unable to palpate uterus due to patient habitus.   Musculoskeletal: Normal range of motion.  Neurological: She is alert and oriented to person, place, and time. No cranial nerve deficit.  Skin: Skin is warm and dry.  Psychiatric: She has a normal mood and affect. Her behavior is normal.  Nursing note and vitals reviewed.    ED Treatments / Results  DIAGNOSTIC STUDIES: Oxygen Saturation is 100% on RA, normal by my interpretation.    COORDINATION OF CARE: 8:47 PM Discussed treatment plan with pt at bedside and pt agreed to plan.  Labs (all labs ordered are listed, but only abnormal results are displayed) Labs Reviewed  WET PREP, GENITAL - Abnormal; Notable for the following:       Result Value   Clue Cells Wet Prep HPF POC PRESENT (*)    WBC, Wet Prep HPF POC FEW (*)    All other components within normal limits  URINALYSIS, ROUTINE W REFLEX MICROSCOPIC - Abnormal; Notable for the following:    APPearance HAZY (*)    Hgb urine dipstick MODERATE (*)    Bacteria, UA MANY (*)    Squamous Epithelial / LPF 0-5 (*)    All other components within normal limits  BASIC METABOLIC PANEL - Abnormal; Notable for the following:    CO2 21 (*)    All other components within normal limits  I-STAT BETA HCG BLOOD, ED ([facility], [facility], [facility] ONLY) - Abnormal; Notable for the following:    I-stat hCG, quantitative 57.3 (*)    All other components within normal limits  URINE CULTURE  CBC WITH DIFFERENTIAL/PLATELET  GC/CHLAMYDIA PROBE AMP ([place]) NOT AT [facility]   Radiology US Ob Comp Less 14 Wks  Result Date: 11/18/2016 CLINICAL DATA:  Acute left-sided abdominal pain/ flank pain. Currently pregnant. I-STAT beta h c g equals 57.3. EXAM: OBSTETRIC <14 WK ULTRASOUND TECHNIQUE: Transabdominal ultrasound was performed for evaluation of the gestation as well as the maternal uterus and adnexal regions. COMPARISON:   None available. FINDINGS: Intrauterine gestational sac: None visualized. Yolk sac:  None visualized. Embryo:  None visualized. Cardiac Activity: N/A Heart Rate: N/A bpm MSD:   mm    w     d CRL:     mm    w  d                  [place] EDC: Subchorionic hemorrhage:  None visualized. Maternal uterus/adnexae: Ovaries were normal in appearance bilaterally. No acute abnormality within the adnexa. No free fluid. IMPRESSION: 1. No intrauterine gestational sac identified. This is consistent with a pregnancy of unknown location at this time. Close clinical monitoring with serial beta HCGs and follow-up ultrasound is recommended as clinically warranted. 2. No other acute abnormality within the  pelvis.  No free fluid. Electronically Signed   By: Jeannine Boga M.D.   On: 11/18/2016 22:34   US Ob Transvaginal  Result Date: 11/18/2016 CLINICAL DATA:  Acute left-sided abdominal pain/ flank pain. Currently pregnant. I-STAT beta h c g equals 57.3. EXAM: OBSTETRIC <14 WK ULTRASOUND TECHNIQUE: Transabdominal ultrasound was performed for evaluation of the gestation as well as the maternal uterus and adnexal regions. COMPARISON:  None available. FINDINGS: Intrauterine gestational sac: None visualized. Yolk sac:  None visualized. Embryo:  None visualized. Cardiac Activity: N/A Heart Rate: N/A bpm MSD:   mm    w     d CRL:     mm    w  d                  [place] EDC: Subchorionic hemorrhage:  None visualized. Maternal uterus/adnexae: Ovaries were normal in appearance bilaterally. No acute abnormality within the adnexa. No free fluid. IMPRESSION: 1. No intrauterine gestational sac identified. This is consistent with a pregnancy of unknown location at this time. Close clinical monitoring with serial beta HCGs and follow-up ultrasound is recommended as clinically warranted. 2. No other acute abnormality within the pelvis.  No free fluid. Electronically Signed   By: Jeannine Boga M.D.   On: 11/18/2016 22:34   US Renal  Result  Date: 11/18/2016 CLINICAL DATA:  Initial evaluation for acute left flank pain, hematuria. EXAM: RENAL / URINARY TRACT ULTRASOUND COMPLETE COMPARISON:  Prior CT from 03/19/2016. FINDINGS: Right Kidney: Length: 11.2 cm. Echogenicity within normal limits. No mass or hydronephrosis visualized. Left Kidney: Length: 11.6 cm. Echogenicity within normal limits. No mass or hydronephrosis visualized. Bladder: Appears normal for degree of bladder distention. IMPRESSION: Normal renal ultrasound. No sonographic evidence for nephrolithiasis or hydronephrosis. Electronically Signed   By: Jeannine Boga M.D.   On: 11/18/2016 22:35    Procedures Procedures (including critical care time)  Medications Ordered in ED Medications - No data to display   Initial Impression / Assessment and Plan / ED Course  I have reviewed the triage vital signs and the nursing notes.  Pertinent labs & imaging results that were available during my care of the patient were reviewed by me and considered in my medical decision making (see chart for details).   Final Clinical Impressions(s) / ED Diagnoses  31 y.o. female with flank pain, n/v and positive pregnancy test stable for d/c without acute abdomen and does not appear toxic. Patient instructed [facility] days to repeat the Bhcg to see if there is a normal rise. She will go sooner if symptoms worsen. Urine sent for culture.   Final diagnoses:  Left flank pain  Less than [redacted] weeks gestation of pregnancy    New Prescriptions Discharge Medication List as of 11/18/2016 11:30 PM    START taking these medications   Details  phenazopyridine (PYRIDIUM) 200 MG tablet Take 1 tablet (200 mg total) by mouth 3 (three) times daily., Starting Thu 11/18/2016, Print    promethazine (PHENERGAN) 12.5 MG tablet Take 1 tablet (12.5 mg total) by mouth every 6 (six) hours as needed for nausea or vomiting., Starting Thu 11/18/2016, Print      I personally performed the  services described in this documentation, which was scribed in my presence. The recorded information has been reviewed and is accurate.    [address] [place], NP 11/19/16 1610    Julianne Rice, MD 11/23/16 [phone]

## 2016-11-18 NOTE — ED Notes (Signed)
Patient transported to Ultrasound 

## 2016-11-19 LAB — GC/CHLAMYDIA PROBE AMP (~~LOC~~) NOT AT ARMC
CHLAMYDIA, DNA PROBE: NEGATIVE
NEISSERIA GONORRHEA: NEGATIVE

## 2016-11-20 LAB — URINE CULTURE

## 2016-11-21 ENCOUNTER — Telehealth: Payer: Self-pay

## 2016-11-21 NOTE — Telephone Encounter (Signed)
Post ED Visit - Positive Culture Follow-up: Unsuccessful Patient Follow-up  Culture assessed and recommendations reviewed by:  []  Elenor Quinones, Pharm.D. []  Heide Guile, Pharm.D., BCPS AQ-ID [x]  Parks Neptune, Pharm.D., BCPS []  Alycia Rossetti, Pharm.D., BCPS []  [place], [place].D., BCPS, AAHIVP []  Legrand Como, Pharm.D., BCPS, AAHIVP []  Salome Arnt, PharmD, BCPS []  Dimitri Ped, PharmD, BCPS []  Vincenza Hews, PharmD, BCPS  Positive urine culture  []  Patient discharged without antimicrobial prescription and treatment is now indicated [x]  Organism is resistant to prescribed ED discharge antimicrobial []  Patient with positive blood cultures   Unable to contact patient after 3 attempts, letter will be sent to address on file  Genia Del 11/21/2016, 10:31 AM

## 2016-11-21 NOTE — Progress Notes (Signed)
ED Antimicrobial Stewardship Positive Culture Follow Up   Rebecca Day is an 31 y.o. female who presented to [facility] on 11/18/2016 with a chief complaint of  Chief Complaint  Patient presents with  . Flank Pain    Recent Results (from the past 720 hour(s))  Urine culture     Status: Abnormal   Collection Time: 11/18/16  7:19 PM  Result Value Ref Range Status   Specimen Description URINE, RANDOM  Final   Special Requests NONE  Final   Culture >=100,000 COLONIES/mL CITROBACTER KOSERI (A)  Final   Report Status 11/20/2016 FINAL  Final   Organism ID, Bacteria CITROBACTER KOSERI (A)  Final      Susceptibility   Citrobacter koseri - MIC*    CEFAZOLIN <=4 SENSITIVE Sensitive     CEFTRIAXONE <=1 SENSITIVE Sensitive     CIPROFLOXACIN <=0.25 SENSITIVE Sensitive     GENTAMICIN <=1 SENSITIVE Sensitive     IMIPENEM <=0.25 SENSITIVE Sensitive     NITROFURANTOIN 32 SENSITIVE Sensitive     TRIMETH/SULFA <=20 SENSITIVE Sensitive     PIP/TAZO <=4 SENSITIVE Sensitive     * >=100,000 COLONIES/mL CITROBACTER KOSERI  Wet prep, genital     Status: Abnormal   Collection Time: 11/18/16 11:30 PM  Result Value Ref Range Status   Yeast Wet Prep HPF POC NONE SEEN NONE SEEN Final    Comment: Specimen diluted due to transport tube containing more than 1 ml of saline, interpret results with caution.   Trich, Wet Prep NONE SEEN NONE SEEN Final   Clue Cells Wet Prep HPF POC PRESENT (A) NONE SEEN Final   WBC, Wet Prep HPF POC FEW (A) NONE SEEN Final   Sperm NONE SEEN  Final    [x]  Treated with **, organism resistant to prescribed antimicrobial []  Patient discharged originally without antimicrobial agent and treatment is now indicated  New antibiotic prescription: keflex 500 bid x 1 week  ED Provider: Janetta Hora, PA-C  Wynell Balloon 11/21/2016, 8:56 AM Infectious Diseases Pharmacist Phone# [phone]

## 2016-11-22 ENCOUNTER — Telehealth: Payer: Self-pay | Admitting: *Deleted

## 2016-11-22 NOTE — Telephone Encounter (Signed)
Post ED Visit - Positive Culture Follow-up: Successful Patient Follow-Up  Culture assessed and recommendations reviewed by: []  Elenor Quinones, Pharm.D. []  Heide Guile, Pharm.D., BCPS AQ-ID []  Parks Neptune, Pharm.D., BCPS []  Alycia Rossetti, Pharm.D., BCPS []  [place], Pharm.D., BCPS, AAHIVP []  Legrand Como, Pharm.D., BCPS, AAHIVP []  Salome Arnt, PharmD, BCPS []  Dimitri Ped, PharmD, BCPS []  Vincenza Hews, PharmD, BCPS  Positive urine culture  [x]  Patient discharged without antimicrobial prescription and treatment is now indicated []  Organism is resistant to prescribed ED discharge antimicrobial []  Patient with positive blood cultures  Changes discussed with ED provider: Janetta Hora, PA New antibiotic prescription Keflex 500 mg PO x 7 days Called to [facility], [phone]  Contacted patient, date 11/12/2016, time [address], [place] 11/22/2016, 4:23 PM

## 2016-11-30 ENCOUNTER — Telehealth: Payer: Self-pay | Admitting: Emergency Medicine

## 2016-11-30 NOTE — Telephone Encounter (Signed)
LOST TO FOLLOWUP  letter returned, no forwarding address

## 2017-01-10 ENCOUNTER — Emergency Department (HOSPITAL_BASED_OUTPATIENT_CLINIC_OR_DEPARTMENT_OTHER)
Admission: EM | Admit: 2017-01-10 | Discharge: 2017-01-10 | Disposition: A | Discharge status: Home / Self Care | Payer: Medicaid Other | Attending: Emergency Medicine | Admitting: Emergency Medicine

## 2017-01-10 ENCOUNTER — Encounter (HOSPITAL_BASED_OUTPATIENT_CLINIC_OR_DEPARTMENT_OTHER): Payer: Self-pay | Admitting: Emergency Medicine

## 2017-01-10 ENCOUNTER — Emergency Department (HOSPITAL_BASED_OUTPATIENT_CLINIC_OR_DEPARTMENT_OTHER): Payer: Medicaid Other

## 2017-01-10 DIAGNOSIS — O208 Other hemorrhage in early pregnancy: Secondary | ICD-10-CM

## 2017-01-10 DIAGNOSIS — J45909 Unspecified asthma, uncomplicated: Secondary | ICD-10-CM | POA: Insufficient documentation

## 2017-01-10 DIAGNOSIS — Z3A14 14 weeks gestation of pregnancy: Secondary | ICD-10-CM | POA: Insufficient documentation

## 2017-01-10 DIAGNOSIS — O209 Hemorrhage in early pregnancy, unspecified: Secondary | ICD-10-CM

## 2017-01-10 DIAGNOSIS — O039 Complete or unspecified spontaneous abortion without complication: Secondary | ICD-10-CM

## 2017-01-10 DIAGNOSIS — N939 Abnormal uterine and vaginal bleeding, unspecified: Secondary | ICD-10-CM

## 2017-01-10 DIAGNOSIS — N9489 Other specified conditions associated with female genital organs and menstrual cycle: Secondary | ICD-10-CM | POA: Insufficient documentation

## 2017-01-10 LAB — BASIC METABOLIC PANEL
Anion gap: 9 (ref 5–15)
BUN: 6 mg/dL (ref 6–20)
CO2: 25 mmol/L (ref 22–32)
CREATININE: 0.8 mg/dL (ref 0.44–1.00)
Calcium: 9.1 mg/dL (ref 8.9–10.3)
Chloride: 105 mmol/L (ref 101–111)
GFR calc Af Amer: >60 mL/min (ref >60)
Glucose, Bld: 100 mg/dL — ABNORMAL HIGH (ref 65–99)
POTASSIUM: 3.7 mmol/L (ref 3.5–5.1)
SODIUM: 139 mmol/L (ref 135–145)

## 2017-01-10 LAB — HCG, SERUM, QUALITATIVE: Preg, Serum: POSITIVE — AB

## 2017-01-10 LAB — CBC WITH DIFFERENTIAL/PLATELET
BASOS ABS: 0 10*3/uL (ref 0.0–0.1)
Basophils Relative: 0 %
Eosinophils Absolute: 0.1 10*3/uL (ref 0.0–0.7)
Eosinophils Relative: 1 %
HCT: 40.3 % (ref 36.0–46.0)
Hemoglobin: 13.9 g/dL (ref 12.0–15.0)
LYMPHS PCT: 34 %
Lymphs Abs: 2.3 10*3/uL (ref 0.7–4.0)
MCH: 30.5 pg (ref 26.0–34.0)
MCHC: 34.5 g/dL (ref 30.0–36.0)
MCV: 88.4 fL (ref 78.0–100.0)
MONO ABS: 0.5 10*3/uL (ref 0.1–1.0)
Monocytes Relative: 8 %
Neutro Abs: 3.8 10*3/uL (ref 1.7–7.7)
Neutrophils Relative %: 57 %
PLATELETS: 301 10*3/uL (ref 150–400)
RBC: 4.56 MIL/uL (ref 3.87–5.11)
RDW: 12.9 % (ref 11.5–15.5)
WBC: 6.8 10*3/uL (ref 4.0–10.5)

## 2017-01-10 LAB — HCG, QUANTITATIVE, PREGNANCY: hCG, Beta Chain, Quant, S: 13 m[IU]/mL — ABNORMAL HIGH (ref <5)

## 2017-01-10 NOTE — Discharge Instructions (Signed)
[facility] for a recheck, and return to the ER if bleeding significantly worsens, or you develop fever, severe abdominal pain, or other new and concerning symptoms.

## 2017-01-10 NOTE — ED Triage Notes (Signed)
States approx [redacted] weeks pregnant.  Has been spotting during entire pregnancy but heavy since yesterday and passing large clots.  Also reports seeing tissue with bleeding yesterday..  Today " I feel bad and dizzy

## 2017-01-10 NOTE — ED Notes (Signed)
Pt tearful -- states she doesn't want an attempt at doppler fetal heart tones since she's going for an ultrasound.

## 2017-01-10 NOTE — ED Provider Notes (Signed)
[place] DEPT [facility] Provider Note   CSN: [id number] Arrival date & time: 01/10/17  1147     History   Chief Complaint Chief Complaint  Patient presents with  . Abdominal Pain    pregnant  . Vaginal Bleeding    HPI Rebecca Day is a 31 y.o. female.  Patient is a 31 year old female with history of multiple miscarriages in the past presenting with vaginal bleeding, passage of clots and tissues, and lower abdominal cramping over the past several days. She is pregnant, she believes at approximately 10 weeks. She has been seen at an OB/GYN clinic in [place] and tells me she had an ultrasound revealing an IUP. She seemed to believe there was some discrepancy between the ultrasound findings and hormone levels, but she is uncertain as to what the discrepancy was. She denies any fevers or chills. She denies any dizziness or lightheadedness.   The history is provided by the patient.  Abdominal Pain   This is a new problem. Episode onset: 1 week ago. The problem occurs constantly. The problem has been gradually worsening. The pain is located in the suprapubic region. The quality of the pain is cramping. The pain is moderate. Pertinent negatives include fever and dysuria. Nothing aggravates the symptoms. Nothing relieves the symptoms.  Vaginal Bleeding  Primary symptoms include vaginal bleeding.  Primary symptoms include no dysuria. Associated symptoms include abdominal pain.    Past Medical History:  Diagnosis Date  . Asthma   . Elevated blood sugar   . Kidney stones   . Lupus    questionable, no meds or treatment per pt  . 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[place])   . Ovarian cyst     Patient Active Problem List   Diagnosis Date Noted  . Family history of spina bifida 07/06/2011    Past Surgical History:  Procedure Laterality Date  . CESAREAN SECTION      OB History    Gravida Para Term Preterm AB Living   3 1 1  0 1 1   SAB TAB Ectopic Multiple Live Births   1 0 0 0         Home  Medications    Prior to Admission medications   Medication Sig Start Date End Date Taking? Authorizing Provider  Prenatal Vit-Fe Fumarate-FA (PRENATAL MULTIVITAMIN) TABS tablet Take 1 tablet by mouth daily at 12 noon.   Yes [provider]  promethazine (PHENERGAN) 12.5 MG tablet Take 1 tablet (12.5 mg total) by mouth every 6 (six) hours as needed for nausea or vomiting. 11/18/16   [place], NP    Family History No family history on file.  Social History Social History  Substance Use Topics  . Smoking status: Never Smoker  . Smokeless tobacco: Never Used  . Alcohol use Yes     Comment: socially     Allergies   Penicillins   Review of Systems Review of Systems  Constitutional: Negative for fever.  Gastrointestinal: Positive for abdominal pain.  Genitourinary: Positive for vaginal bleeding. Negative for dysuria.  All other systems reviewed and are negative.    Physical Exam Updated Vital Signs BP (!) 140/91 (BP Location: Left Arm)   Pulse 96   Temp 98.4 F (36.9 C) (Oral)   Resp 20   Ht 5\' 9"  (1.753 m)   Wt 280 lb (127 kg)   SpO2 98%   BMI 41.35 kg/m   Physical Exam  Constitutional: She is oriented to person, place, and time. She appears well-developed and  well-nourished. No distress.  HENT:  Head: Normocephalic and atraumatic.  Neck: Normal range of motion. Neck supple.  Cardiovascular: Normal rate and regular rhythm.  Exam reveals no gallop and no friction rub.   No murmur heard. Pulmonary/Chest: Effort normal and breath sounds normal. No respiratory distress. She has no wheezes.  Abdominal: Soft. Bowel sounds are normal. She exhibits no distension. There is tenderness. There is no rebound and no guarding.  There is mild suprapubic tenderness to palpation.  Musculoskeletal: Normal range of motion.  Neurological: She is alert and oriented to person, place, and time.  Skin: Skin is warm and dry. She is not diaphoretic.  Nursing note and vitals  reviewed.    ED Treatments / Results  Labs (all labs ordered are listed, but only abnormal results are displayed) Labs Reviewed  BASIC METABOLIC PANEL  CBC WITH DIFFERENTIAL/PLATELET  HCG, SERUM, QUALITATIVE    EKG  EKG Interpretation None       Radiology No results found.  Procedures Procedures (including critical care time)  Medications Ordered in ED Medications - No data to display   Initial Impression / Assessment and Plan / ED Course  I have reviewed the triage vital signs and the nursing notes.  Pertinent labs & imaging results that were available during my care of the patient were reviewed by me and considered in my medical decision making (see chart for details).  Patient with history of multiple miscarriages presents today with complaints of vaginal bleeding and being [redacted] weeks pregnant. Her workup reveals an empty uterus on her ultrasound and normal vital signs with hemoglobin of 13.9.  I informed the patient of these ultrasound findings and she was visibly upset. I reiterated the importance of follow-up with OB in the next week.  Final Clinical Impressions(s) / ED Diagnoses   Final diagnoses:  None    New Prescriptions New Prescriptions   No medications on file     Veryl Speak, MD 01/10/17 1450

## 2017-01-10 NOTE — ED Notes (Signed)
ED Provider at bedside. 

## 2017-01-10 NOTE — ED Notes (Signed)
Blood already in lab verify by lab can be used for [employer]

## 2017-01-27 ENCOUNTER — Encounter (HOSPITAL_COMMUNITY): Payer: Self-pay

## 2017-01-27 ENCOUNTER — Emergency Department (HOSPITAL_COMMUNITY)
Admission: EM | Admit: 2017-01-27 | Discharge: 2017-01-28 | Disposition: A | Discharge status: Home / Self Care | Payer: Medicaid Other | Attending: Emergency Medicine | Admitting: Emergency Medicine

## 2017-01-27 DIAGNOSIS — I10 Essential (primary) hypertension: Secondary | ICD-10-CM | POA: Insufficient documentation

## 2017-01-27 DIAGNOSIS — L298 Other pruritus: Secondary | ICD-10-CM | POA: Insufficient documentation

## 2017-01-27 DIAGNOSIS — Z5321 Procedure and treatment not carried out due to patient leaving prior to being seen by health care provider: Secondary | ICD-10-CM | POA: Insufficient documentation

## 2017-01-27 LAB — POC URINE PREG, ED: Preg Test, Ur: NEGATIVE

## 2017-01-27 NOTE — ED Triage Notes (Signed)
Pt states she has hx of lupas; pt states she has miscarriage 2 weeks ago with HTN , HA and itching; pt states she started itching again yesterday with no rash and has increased til today; pt states she was concerned about BP being elevated; pt bp at triage at 127/86; pt a&ox 4 on arrival. Pt c/o HA at 5/10 on arrival.

## 2017-01-27 NOTE — ED Notes (Signed)
Pt called to room x2, again no answer.

## 2017-01-27 NOTE — ED Notes (Signed)
Patient called to room x2, no answer.

## 2017-02-08 ENCOUNTER — Emergency Department (HOSPITAL_COMMUNITY)
Admission: EM | Admit: 2017-02-08 | Discharge: 2017-02-09 | Disposition: A | Discharge status: Home / Self Care | Payer: Medicaid Other | Attending: Emergency Medicine | Admitting: Emergency Medicine

## 2017-02-08 ENCOUNTER — Encounter (HOSPITAL_COMMUNITY): Payer: Self-pay | Admitting: *Deleted

## 2017-02-08 ENCOUNTER — Emergency Department (HOSPITAL_COMMUNITY): Payer: Medicaid Other

## 2017-02-08 DIAGNOSIS — M329 Systemic lupus erythematosus, unspecified: Secondary | ICD-10-CM

## 2017-02-08 DIAGNOSIS — J45909 Unspecified asthma, uncomplicated: Secondary | ICD-10-CM | POA: Insufficient documentation

## 2017-02-08 DIAGNOSIS — M3210 Systemic lupus erythematosus, organ or system involvement unspecified: Secondary | ICD-10-CM | POA: Insufficient documentation

## 2017-02-08 DIAGNOSIS — R0602 Shortness of breath: Secondary | ICD-10-CM | POA: Insufficient documentation

## 2017-02-08 DIAGNOSIS — R079 Chest pain, unspecified: Secondary | ICD-10-CM | POA: Insufficient documentation

## 2017-02-08 DIAGNOSIS — Z79899 Other long term (current) drug therapy: Secondary | ICD-10-CM | POA: Insufficient documentation

## 2017-02-08 LAB — BASIC METABOLIC PANEL
ANION GAP: 7 (ref 5–15)
BUN: 12 mg/dL (ref 6–20)
CALCIUM: 9.3 mg/dL (ref 8.9–10.3)
CO2: 23 mmol/L (ref 22–32)
CREATININE: 0.89 mg/dL (ref 0.44–1.00)
Chloride: 105 mmol/L (ref 101–111)
GLUCOSE: 113 mg/dL — AB (ref 65–99)
Potassium: 3.7 mmol/L (ref 3.5–5.1)
Sodium: 135 mmol/L (ref 135–145)

## 2017-02-08 LAB — CBC
HCT: 41 % (ref 36.0–46.0)
Hemoglobin: 13.8 g/dL (ref 12.0–15.0)
MCH: 30.3 pg (ref 26.0–34.0)
MCHC: 33.7 g/dL (ref 30.0–36.0)
MCV: 89.9 fL (ref 78.0–100.0)
PLATELETS: 290 10*3/uL (ref 150–400)
RBC: 4.56 MIL/uL (ref 3.87–5.11)
RDW: 12.8 % (ref 11.5–15.5)
WBC: 8.5 10*3/uL (ref 4.0–10.5)

## 2017-02-08 LAB — I-STAT TROPONIN, ED: TROPONIN I, POC: 0 ng/mL (ref 0.00–0.08)

## 2017-02-08 MED ORDER — PREDNISONE 20 MG PO TABS
60.0000 mg | ORAL_TABLET | Freq: Once | ORAL | Status: AC
  Administered 2017-02-08: 60 mg via ORAL
  Filled 2017-02-08: qty 3

## 2017-02-08 MED ORDER — ALBUTEROL SULFATE HFA 108 (90 BASE) MCG/ACT IN AERS
2.0000 | INHALATION_SPRAY | RESPIRATORY_TRACT | Status: DC | PRN
  Administered 2017-02-09: 2 via RESPIRATORY_TRACT
  Filled 2017-02-08: qty 6.7

## 2017-02-08 NOTE — ED Triage Notes (Signed)
Pt c/o chest pain x1 week. Reports increased BLE and sob onset for the past couple of days. Hx of lupus, feels pain and sob are different than previous lupus flareups.

## 2017-02-09 MED ORDER — HYDROXYCHLOROQUINE SULFATE 200 MG PO TABS: 200.0000 mg | ORAL_TABLET | Freq: Two times a day (BID) | ORAL | 1 refills | Status: DC

## 2017-02-09 MED ORDER — PREDNISONE 10 MG PO TABS: 20.0000 mg | ORAL_TABLET | Freq: Every day | ORAL | 0 refills | Status: DC

## 2017-02-09 MED ORDER — HYDROXYCHLOROQUINE SULFATE 200 MG PO TABS
200.0000 mg | ORAL_TABLET | Freq: Every day | ORAL | Status: DC
  Administered 2017-02-09: 200 mg via ORAL
  Filled 2017-02-09: qty 1

## 2017-02-09 NOTE — Discharge Instructions (Signed)
Please make every effort to establish with a primary care physician or rheumatologist.  U been given a prescription for prednisone as well as Plaquenil.  Please take these as directed.  Follow-up as needed

## 2017-02-09 NOTE — ED Notes (Signed)
Pt states she has taken plaquenil before and does not need to wait 20 minutes

## 2017-02-09 NOTE — ED Provider Notes (Signed)
[facility] DEPT Provider Note   CSN: [id number] Arrival date & time: 02/08/17  2114     History   Chief Complaint Chief Complaint  Patient presents with  . Chest Pain    HPI Rebecca Day is a 31 y.o. female.  This a 31 year old female with history of lupus who states for the past 2 weeks.  She's had joint swelling and pain.  She also has for the past couple days, some chest discomfort, shortness of breath with exertion. She states that she has no primary care doctor in the area.  She is out of her prednisone.  She is out of her Plaquenil.  She is out of her albuterol inhaler. Denies any recent illnesses or fever      Past Medical History:  Diagnosis Date  . Asthma   . Elevated blood sugar   . Kidney stones   . Lupus    questionable, no meds or treatment per pt  . 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[place])   . Ovarian cyst     Patient Active Problem List   Diagnosis Date Noted  . Family history of spina bifida 07/06/2011    Past Surgical History:  Procedure Laterality Date  . CESAREAN SECTION      OB History    Gravida Para Term Preterm AB Living   7 3 3  0 2 2   SAB TAB Ectopic Multiple Live Births   2 0 0 0 2       Home Medications    Prior to Admission medications   Medication Sig Start Date End Date Taking? Authorizing Provider  hydroxychloroquine (PLAQUENIL) 200 MG tablet Take 1 tablet (200 mg total) by mouth 2 (two) times daily. 02/09/17   Junius Creamer, NP  predniSONE (DELTASONE) 10 MG tablet Take 2 tablets (20 mg total) by mouth daily. 02/09/17   Junius Creamer, NP  Prenatal Vit-Fe Fumarate-FA (PRENATAL MULTIVITAMIN) TABS tablet Take 1 tablet by mouth daily at 12 noon.    [provider]  promethazine (PHENERGAN) 12.5 MG tablet Take 1 tablet (12.5 mg total) by mouth every 6 (six) hours as needed for nausea or vomiting. 11/18/16   Ashley Murrain, NP    Family History No family history on file.  Social History Social History  Substance Use Topics  .  Smoking status: Never Smoker  . Smokeless tobacco: Never Used  . Alcohol use Yes     Comment: socially     Allergies   Penicillins   Review of Systems Review of Systems  Constitutional: Negative for fever.  Respiratory: Positive for shortness of breath. Negative for cough.   Cardiovascular: Positive for chest pain.  Musculoskeletal: Positive for joint swelling and myalgias.  Skin: Negative for rash.  Neurological: Negative for weakness.  All other systems reviewed and are negative.    Physical Exam Updated Vital Signs BP 138/83   Pulse 95   Temp 98.2 F (36.8 C) (Oral)   Resp 19   Ht 5\' 9"  (1.753 m)   Wt 122.5 kg (270 lb)   SpO2 98%   BMI 39.87 kg/m   Physical Exam  Constitutional: She appears well-developed and well-nourished.  HENT:  Head: Normocephalic.  Eyes: Pupils are equal, round, and reactive to light.  Neck: Normal range of motion.  Cardiovascular: Normal rate and regular rhythm.   Pulmonary/Chest: Effort normal and breath sounds normal.  Abdominal: Soft. She exhibits no distension.  Musculoskeletal: She exhibits tenderness. She exhibits no edema or deformity.  Right shoulder: She exhibits tenderness.       Left shoulder: She exhibits tenderness.       Right knee: She exhibits swelling.       Left knee: She exhibits swelling.       Right ankle: She exhibits swelling.       Left ankle: She exhibits swelling.  Neurological: She is alert.  Skin: Skin is warm.  Psychiatric: She has a normal mood and affect.  Vitals reviewed.    ED Treatments / Results  Labs (all labs ordered are listed, but only abnormal results are displayed) Labs Reviewed  BASIC METABOLIC PANEL - Abnormal; Notable for the following:       Result Value   Glucose, Bld 113 (*)    All other components within normal limits  CBC  I-STAT TROPOININ, ED    EKG  EKG Interpretation None       Radiology Dg Chest 2 View  Result Date: 02/08/2017 CLINICAL DATA:   31 year old presenting with 1 week history of chest pain, increasing bilateral lower extremity edema and shortness of breath. Current history of lupus. EXAM: CHEST  2 VIEW COMPARISON:  10/07/2016, 10/02/2016 and earlier. FINDINGS: Cardiomediastinal silhouette unremarkable, unchanged. Prominent bronchovascular markings diffusely and mild to moderate central peribronchial thickening, more so than on prior examinations. Lungs otherwise clear. No localized airspace consolidation. No pleural effusions. No pneumothorax. Normal pulmonary vascularity. Visualized bony thorax intact. IMPRESSION: Mild-to-moderate changes of acute bronchitis and/or asthma without focal airspace pneumonia. Electronically Signed   By: Evangeline Dakin M.D.   On: 02/08/2017 21:41    Procedures Procedures (including critical care time)  Medications Ordered in ED Medications  albuterol (PROVENTIL HFA;VENTOLIN HFA) 108 (90 Base) MCG/ACT inhaler 2 puff (not administered)  hydroxychloroquine (PLAQUENIL) tablet 200 mg (not administered)  predniSONE (DELTASONE) tablet 60 mg (60 mg Oral Given 02/08/17 2359)     Initial Impression / Assessment and Plan / ED Course  I have reviewed the triage vital signs and the nursing notes.  Pertinent labs & imaging results that were available during my care of the patient were reviewed by me and considered in my medical decision making (see chart for details).      Patient states that she has just been given the name of her rheumatologist at least accepting new patients and we will make every effort to establish care.  She has been given a prescription for prednisone as well as Plaquenil and provided with an albuterol inhaler.   Final Clinical Impressions(s) / ED Diagnoses   Final diagnoses:  Nonspecific chest pain  Systemic lupus erythematosus, unspecified SLE type, unspecified organ involvement status (HCC)    New Prescriptions New Prescriptions   HYDROXYCHLOROQUINE (PLAQUENIL) 200 MG  TABLET    Take 1 tablet (200 mg total) by mouth 2 (two) times daily.   PREDNISONE (DELTASONE) 10 MG TABLET    Take 2 tablets (20 mg total) by mouth daily.     Junius Creamer, NP 03/10/18 7588    Delora Fuel, MD 32/54/98 [phone]

## 2017-02-15 ENCOUNTER — Ambulatory Visit (HOSPITAL_COMMUNITY)
Admission: RE | Admit: 2017-02-15 | Discharge: 2017-02-15 | Disposition: A | Discharge status: Home / Self Care | Payer: Self-pay | Attending: Psychiatry | Admitting: Psychiatry

## 2017-02-15 DIAGNOSIS — F329 Major depressive disorder, single episode, unspecified: Secondary | ICD-10-CM | POA: Insufficient documentation

## 2017-02-15 DIAGNOSIS — Z1389 Encounter for screening for other disorder: Secondary | ICD-10-CM | POA: Insufficient documentation

## 2017-02-15 DIAGNOSIS — F419 Anxiety disorder, unspecified: Secondary | ICD-10-CM | POA: Insufficient documentation
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
## 2017-02-15 NOTE — H&P (Signed)
Behavioral Health Medical Screening Exam  Rebecca Day is an 31 y.o. female, accompanied by her partner and presenting with mixed depression and anxiety symptoms chronic but acutely exacerbated after the death of twins at reported [redacted] weeks gestation. Patient is endorsing the passing of previous twin gestation just 2 years prior. She is denying nay lethality to include cutting, self mutilation passive or active SI with intent , plan or means, SA or HI. She is denying any acute co-morbid conditions or acute ailments at this present time. She denies use of psychotropics and or psychotherapy intervention.  Total Time spent with patient: 20 minutes  Psychiatric Specialty Exam: Physical Exam  Constitutional: She is oriented to person, place, and time. She appears well-developed and well-nourished. No distress.  HENT:  Head: Normocephalic.  Respiratory: Effort normal. No respiratory distress.  Neurological: She is alert and oriented to person, place, and time. No cranial nerve deficit.  Skin: Skin is warm and dry. She is not diaphoretic.  Psychiatric: Her speech is normal and behavior is normal. Judgment normal. Her mood appears anxious. Cognition and memory are impaired. She exhibits a depressed mood. She expresses no homicidal and no suicidal ideation.    Review of Systems  Psychiatric/Behavioral: Positive for depression. The patient is nervous/anxious.   All other systems reviewed and are negative.   unknown if currently breastfeeding.There is no height or weight on file to calculate BMI.  General Appearance: Casual  Eye Contact:  Good  Speech:  Clear and Coherent  Volume:  Normal  Mood:  Anxious and Depressed  Affect:  Congruent  Thought Process:  Goal Directed  Orientation:  Full (Time, Place, and Person)  Thought Content:  Rumination  Suicidal Thoughts:  No  Homicidal Thoughts:  No  Memory:  Immediate;   Good  Judgement:  Fair  Insight:  Fair  Psychomotor Activity:  Normal   Concentration: Concentration: Fair  Recall:  [place] of Knowledge:Good  Language: Good  Akathisia:  Negative  Handed:  Right  AIMS (if indicated):     Assets:  Desire for Improvement  Sleep:       Musculoskeletal: Strength & Muscle Tone: within normal limits Gait & Station: normal Patient leans: N/A  unknown if currently breastfeeding.  Recommendations:  Based on my evaluation the patient does not appear to have an emergency medical condition.  Laverle Hobby, PA-C 02/15/2017, 9:22 PM

## 2017-04-16 ENCOUNTER — Emergency Department (HOSPITAL_BASED_OUTPATIENT_CLINIC_OR_DEPARTMENT_OTHER)
Admission: EM | Admit: 2017-04-16 | Discharge: 2017-04-16 | Disposition: A | Discharge status: Home / Self Care | Payer: Medicaid Other | Attending: Emergency Medicine | Admitting: Emergency Medicine

## 2017-04-16 ENCOUNTER — Encounter (HOSPITAL_BASED_OUTPATIENT_CLINIC_OR_DEPARTMENT_OTHER): Payer: Self-pay | Admitting: Emergency Medicine

## 2017-04-16 DIAGNOSIS — Z79899 Other long term (current) drug therapy: Secondary | ICD-10-CM | POA: Insufficient documentation

## 2017-04-16 DIAGNOSIS — K0889 Other specified disorders of teeth and supporting structures: Secondary | ICD-10-CM

## 2017-04-16 MED ORDER — CLINDAMYCIN HCL 150 MG PO CAPS: 450.0000 mg | ORAL_CAPSULE | Freq: Three times a day (TID) | ORAL | 0 refills | Status: AC

## 2017-04-16 MED ORDER — HYDROCODONE-ACETAMINOPHEN 5-325 MG PO TABS: 1.0000 | ORAL_TABLET | ORAL | 0 refills | Status: DC | PRN

## 2017-04-16 NOTE — ED Triage Notes (Signed)
Patient has pain and swelling to her left jaw and a toothache

## 2017-04-16 NOTE — ED Provider Notes (Signed)
[place] [facility] Provider Note   CSN: [id number] Arrival date & time: 04/16/17  1815     History   Chief Complaint Chief Complaint  Patient presents with  . Dental Pain    HPI Rebecca Day is a 31 y.o. female.  HPI  Patient presents to ED for evaluation of left-sided dental pain and swelling on lower gumline that began 2 days ago but has worsened today. She has a history of several broken teeth in the area. She denies any drainage or bleeding from site. She has tried ibuprofen, Tylenol and aspirin with mild relief in her symptoms. She denies any chest pain, trouble breathing, trouble swallowing, drooling, or rashes.  Past Medical History:  Diagnosis Date  . Asthma   . Elevated blood sugar   . Kidney stones   . Lupus    questionable, no meds or treatment per pt  . 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[place])   . Ovarian cyst     Patient Active Problem List   Diagnosis Date Noted  . Family history of spina bifida 07/06/2011    Past Surgical History:  Procedure Laterality Date  . CESAREAN SECTION      OB History    Gravida Para Term Preterm AB Living   7 3 3  0 2 2   SAB TAB Ectopic Multiple Live Births   2 0 0 0 2       Home Medications    Prior to Admission medications   Medication Sig Start Date End Date Taking? Authorizing Provider  clindamycin (CLEOCIN) 150 MG capsule Take 3 capsules (450 mg total) by mouth 3 (three) times daily. 04/16/17 04/23/17  Delia Heady, PA-C  HYDROcodone-acetaminophen (NORCO/VICODIN) 5-325 MG tablet Take 1 tablet by mouth every 4 (four) hours as needed. 04/16/17   Chinonso Linker, PA-C  hydroxychloroquine (PLAQUENIL) 200 MG tablet Take 1 tablet (200 mg total) by mouth 2 (two) times daily. 02/09/17   Junius Creamer, NP  predniSONE (DELTASONE) 10 MG tablet Take 2 tablets (20 mg total) by mouth daily. 02/09/17   Junius Creamer, NP  Prenatal Vit-Fe Fumarate-FA (PRENATAL MULTIVITAMIN) TABS tablet Take 1 tablet by mouth daily at 12 noon.    [provider]  promethazine (PHENERGAN) 12.5 MG tablet Take 1 tablet (12.5 mg total) by mouth every 6 (six) hours as needed for nausea or vomiting. 11/18/16   Ashley Murrain, NP    Family History History reviewed. No pertinent family history.  Social History Social History  Substance Use Topics  . Smoking status: Never Smoker  . Smokeless tobacco: Never Used  . Alcohol use Yes     Comment: socially     Allergies   Penicillins   Review of Systems Review of Systems  Constitutional: Negative for chills and fever.  HENT: Positive for dental problem and facial swelling. Negative for drooling, sinus pressure, sore throat, trouble swallowing and voice change.   Respiratory: Negative for shortness of breath.   Cardiovascular: Negative for chest pain.  Gastrointestinal: Negative for nausea and vomiting.  Skin: Negative for color change.     Physical Exam Updated Vital Signs BP (!) 144/81 (BP Location: Right Arm)   Pulse 100   Temp 98.8 F (37.1 C) (Oral)   Resp 18   Ht 5\' 9"  (1.753 m)   Wt 122.5 kg (270 lb)   LMP 04/16/2017   SpO2 100%   BMI 39.87 kg/m   Physical Exam  Constitutional: She appears well-developed and well-nourished. No distress.  HENT:  Head:  Normocephalic and atraumatic.  Mouth/Throat: Uvula is midline and oropharynx is clear and moist.    Tenderness to palpation in the indicated gumline. No visible abscess. No facial, neck or cheek swelling noted. No pooling of secretions or trismus.  Normal voice noted with no difficulty swallowing or breathing.  Eyes: Conjunctivae and EOM are normal. No scleral icterus.  Neck: Normal range of motion.  Pulmonary/Chest: Effort normal. No respiratory distress.  Neurological: She is alert.  Skin: No rash noted. She is not diaphoretic.  Psychiatric: She has a normal mood and affect.  Nursing note and vitals reviewed.    ED Treatments / Results  Labs (all labs ordered are listed, but only abnormal results are  displayed) Labs Reviewed - No data to display  EKG  EKG Interpretation None       Radiology No results found.  Procedures Procedures (including critical care time)  Medications Ordered in ED Medications - No data to display   Initial Impression / Assessment and Plan / ED Course  I have reviewed the triage vital signs and the nursing notes.  Pertinent labs & imaging results that were available during my care of the patient were reviewed by me and considered in my medical decision making (see chart for details).    Patient presents to ED for evaluation of dental pain on left side that occurred 2 days ago but has worsened today. She has a history of several broken teeth in the area. She denies any drainage or bleeding from site. There is mild swelling noted but no visible drainable abscess. She is tolerating secretions, no drooling or trismus noted. No crepitus noted in submandibular region. Low suspicion for Ludwig angina or other acute severe deep tissue infection of the neck. She is nontoxic appearing and in no acute distress. Will give patient clindamycin due to her penicillin allergy and short course of pain medication. Encouraged her to continue ibuprofen for pain. Encouraged to follow up with dentist for further evaluation. Patient appears stable for discharge at this time. Strict return precautions given. [place] narcotic database reviewed.  Final Clinical Impressions(s) / ED Diagnoses   Final diagnoses:  Pain, dental    New Prescriptions Discharge Medication List as of 04/16/2017  7:12 PM    START taking these medications   Details  clindamycin (CLEOCIN) 150 MG capsule Take 3 capsules (450 mg total) by mouth 3 (three) times daily., Starting Sat 04/16/2017, Until Sat 04/23/2017, Print    HYDROcodone-acetaminophen (NORCO/VICODIN) 5-325 MG tablet Take 1 tablet by mouth every 4 (four) hours as needed., Starting Sat 04/16/2017, Print         Shelly Coss Ambler, PA-C 04/16/17  2357    Margette Fast, MD 04/17/17 1443

## 2017-04-16 NOTE — Discharge Instructions (Signed)
Please read attached information regarding your condition. Continue ibuprofen as needed for pain. Take Norco as needed for severe pain. Take clindamycin 3 times daily for 1 week. Follow-up with your dentist for further evaluation. Return to ED for worsening pain, trouble breathing, trouble swallowing, increased drainage from site, signs of severe or worsening infection.

## 2017-04-17 ENCOUNTER — Encounter (HOSPITAL_COMMUNITY): Payer: Self-pay | Admitting: Emergency Medicine

## 2017-04-17 ENCOUNTER — Emergency Department (HOSPITAL_COMMUNITY)
Admission: EM | Admit: 2017-04-17 | Discharge: 2017-04-17 | Disposition: A | Discharge status: Home / Self Care | Payer: Self-pay | Attending: Emergency Medicine | Admitting: Emergency Medicine

## 2017-04-17 DIAGNOSIS — R220 Localized swelling, mass and lump, head: Secondary | ICD-10-CM | POA: Insufficient documentation

## 2017-04-17 DIAGNOSIS — Z5321 Procedure and treatment not carried out due to patient leaving prior to being seen by health care provider: Secondary | ICD-10-CM | POA: Insufficient documentation

## 2017-04-17 NOTE — ED Triage Notes (Signed)
The patient said the swelling started Thursday, she said she has a chipped tooth and she went to [facility] to get it checked.  They gave her abx prescription for keflex and she took it until Saturday and instead of getting better it is getting worse.  She went to [facility] and they gave her two other prescriptions that she has not gotten filled.  She is here because the pain and swelling have gotten worse instead of better.  She rates her pain 5/10.  She has taken ibuprofen for the pain.

## 2017-04-17 NOTE — ED Notes (Signed)
Pt called for in the lobby x 2.  Taking out of room.

## 2017-04-17 NOTE — ED Notes (Signed)
Pt called x1 in the waiting room to be brought back into the room.

## 2017-06-24 ENCOUNTER — Emergency Department (HOSPITAL_COMMUNITY): Payer: Self-pay

## 2017-06-24 ENCOUNTER — Emergency Department (HOSPITAL_COMMUNITY)
Admission: EM | Admit: 2017-06-24 | Discharge: 2017-06-24 | Disposition: A | Discharge status: Home / Self Care | Payer: Self-pay | Attending: Emergency Medicine | Admitting: Emergency Medicine

## 2017-06-24 ENCOUNTER — Encounter (HOSPITAL_COMMUNITY): Payer: Self-pay | Admitting: *Deleted

## 2017-06-24 DIAGNOSIS — J45901 Unspecified asthma with (acute) exacerbation: Secondary | ICD-10-CM | POA: Insufficient documentation

## 2017-06-24 MED ORDER — PREDNISONE 20 MG PO TABS
60.0000 mg | ORAL_TABLET | Freq: Once | ORAL | Status: AC
  Administered 2017-06-24: 60 mg via ORAL
  Filled 2017-06-24: qty 3

## 2017-06-24 MED ORDER — PREDNISONE 20 MG PO TABS: 20.0000 mg | ORAL_TABLET | Freq: Two times a day (BID) | ORAL | 0 refills | Status: DC

## 2017-06-24 MED ORDER — IPRATROPIUM-ALBUTEROL 0.5-2.5 (3) MG/3ML IN SOLN
3.0000 mL | Freq: Once | RESPIRATORY_TRACT | Status: AC
  Administered 2017-06-24: 3 mL via RESPIRATORY_TRACT
  Filled 2017-06-24: qty 6

## 2017-06-24 MED ORDER — ALBUTEROL SULFATE HFA 108 (90 BASE) MCG/ACT IN AERS: 2.0000 | INHALATION_SPRAY | RESPIRATORY_TRACT | 0 refills | Status: DC | PRN

## 2017-06-24 NOTE — ED Notes (Signed)
Declined W/C at D/C and was escorted to lobby by RN. 

## 2017-06-24 NOTE — ED Provider Notes (Signed)
[place] EMERGENCY DEPARTMENT Provider Note   CSN: [id number] Arrival date & time: 06/24/17  0827     History   Chief Complaint Chief Complaint  Patient presents with  . Asthma    HPI Rebecca Day is a 31 y.o. female.  She presents for evaluation of "tight feeling," in her chest.  He attributes it to her asthma, which is not improving with albuterol inhaler using about 6 times a day.  She denies fever, cough, weakness or dizziness.  No known sick contacts.  No nausea or vomiting.  There are no other known modifying factors.  HPI  Past Medical History:  Diagnosis Date  . Asthma   . Elevated blood sugar   . Kidney stones   . Lupus    questionable, no meds or treatment per pt  . 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[place])   . Ovarian cyst     Patient Active Problem List   Diagnosis Date Noted  . Family history of spina bifida 07/06/2011    Past Surgical History:  Procedure Laterality Date  . CESAREAN SECTION      OB History    Gravida Para Term Preterm AB Living   7 3 3  0 2 2   SAB TAB Ectopic Multiple Live Births   2 0 0 0 2       Home Medications    Prior to Admission medications   Medication Sig Start Date End Date Taking? Authorizing Provider  acetaminophen (TYLENOL) 500 MG tablet Take 1,000 mg by mouth every 6 (six) hours as needed for moderate pain.   Yes [provider]  albuterol (PROVENTIL HFA;VENTOLIN HFA) 108 (90 Base) MCG/ACT inhaler Inhale 2 puffs into the lungs every 4 (four) hours as needed for wheezing or shortness of breath. 06/24/17   Daleen Bo, MD  hydroxychloroquine (PLAQUENIL) 200 MG tablet Take 1 tablet (200 mg total) by mouth 2 (two) times daily. Patient not taking: Reported on 06/24/2017 02/09/17   Junius Creamer, NP  predniSONE (DELTASONE) 20 MG tablet Take 1 tablet (20 mg total) by mouth 2 (two) times daily. 06/24/17   Daleen Bo, MD    Family History No family history on file.  Social History Social History    Substance Use Topics  . Smoking status: Never Smoker  . Smokeless tobacco: Never Used  . Alcohol use Yes     Comment: socially     Allergies   Penicillins   Review of Systems Review of Systems  All other systems reviewed and are negative.    Physical Exam Updated Vital Signs BP 122/65   Pulse 91   Temp 98.4 F (36.9 C) (Oral)   Resp 16   Ht 5\' 9"  (1.753 m)   Wt 131.5 kg (290 lb)   LMP 06/13/2017   SpO2 96%   BMI 42.83 kg/m   Physical Exam  Constitutional: She is oriented to person, place, and time. She appears well-developed and well-nourished.  HENT:  Head: Normocephalic and atraumatic.  Eyes: Pupils are equal, round, and reactive to light. Conjunctivae and EOM are normal.  Neck: Normal range of motion and phonation normal. Neck supple.  Cardiovascular: Normal rate and regular rhythm.   Pulmonary/Chest: Effort normal. No respiratory distress. She exhibits no tenderness.  Decreased air movement bilaterally.  No audible wheezes rales or rhonchi.  Abdominal: Soft. She exhibits no distension. There is no tenderness. There is no guarding.  Musculoskeletal: Normal range of motion.  Neurological: She is alert and oriented to person,  place, and time. She exhibits normal muscle tone.  Skin: Skin is warm and dry.  Psychiatric: She has a normal mood and affect. Her behavior is normal. Judgment and thought content normal.  Nursing note and vitals reviewed.    ED Treatments / Results  Labs (all labs ordered are listed, but only abnormal results are displayed) Labs Reviewed - No data to display  EKG  EKG Interpretation None       Radiology Dg Chest 2 View  Result Date: 06/24/2017 CLINICAL DATA:  Cough and chest pain. History of systemic lupus erythematosus. EXAM: CHEST  2 VIEW COMPARISON:  February 08, 2017 FINDINGS: Lungs are clear. Heart size and pulmonary vascularity are normal. No adenopathy. No pneumothorax. No bone lesions. IMPRESSION: No edema or  consolidation. Electronically Signed   By: Lowella Grip III M.D.   On: 06/24/2017 09:56    Procedures Procedures (including critical care time)  Medications Ordered in ED Medications  ipratropium-albuterol (DUONEB) 0.5-2.5 (3) MG/3ML nebulizer solution 3 mL (3 mLs Nebulization Given 06/24/17 1000)  predniSONE (DELTASONE) tablet 60 mg (60 mg Oral Given 06/24/17 1000)     Initial Impression / Assessment and Plan / ED Course  I have reviewed the triage vital signs and the nursing notes.  Pertinent labs & imaging results that were available during my care of the patient were reviewed by me and considered in my medical decision making (see chart for details).      Patient Vitals for the past 24 hrs:  BP Temp Temp src Pulse Resp SpO2 Height Weight  06/24/17 1330 122/65 - - 91 - 96 % - -  06/24/17 1300 104/61 - - 88 - 99 % - -  06/24/17 1215 129/79 - - 94 - 97 % - -  06/24/17 1200 (!) 132/94 - - 91 - 100 % - -  06/24/17 1145 124/76 - - 87 - 97 % - -  06/24/17 1130 128/76 - - 90 - 99 % - -  06/24/17 1115 112/64 - - 93 - 99 % - -  06/24/17 1100 109/62 - - 95 - 100 % - -  06/24/17 1001 132/86 - - 90 - 97 % - -  06/24/17 0832 (!) 149/82 98.4 F (36.9 C) Oral 89 16 100 % 5\' 9"  (1.753 m) 131.5 kg (290 lb)    1:34 PM Reevaluation with update and discussion. After initial assessment and treatment, an updated evaluation reveals she states that she is feeling better.  She is not in respiratory distress.  Findings discussed with the patient and all questions were answered. Kellene Mccleary L     Final Clinical Impressions(s) / ED Diagnoses   Final diagnoses:  Mild asthma with exacerbation, unspecified whether persistent   Asthma exacerbation, etiology not clear.  Patient stable for discharge.  Doubt pneumonia or ACS.  Nursing Notes Reviewed/ Care Coordinated Applicable Imaging Reviewed Interpretation of Laboratory Data incorporated into ED treatment  The patient appears reasonably  screened and/or stabilized for discharge and I doubt any other medical condition or other [facility] requiring further screening, evaluation, or treatment in the ED at this time prior to discharge.  Plan: Home Medications-continue usual medication; Home Treatments-rest; return here if the recommended treatment, does not improve the symptoms; Recommended follow up-PCP, as needed   New Prescriptions New Prescriptions   ALBUTEROL (PROVENTIL HFA;VENTOLIN HFA) 108 (90 BASE) MCG/ACT INHALER    Inhale 2 puffs into the lungs every 4 (four) hours as needed for wheezing or shortness of breath.  PREDNISONE (DELTASONE) 20 MG TABLET    Take 1 tablet (20 mg total) by mouth 2 (two) times daily.     Daleen Bo, MD 06/24/17 [phone]

## 2017-06-24 NOTE — ED Triage Notes (Signed)
Pt states that she has had a productive cough and wheezing for 1 week. Pt states that she has used her inhaler with some relief. No wheezing noted in triage.

## 2017-06-24 NOTE — ED Notes (Signed)
Patient transported to X-ray 

## 2017-06-24 NOTE — Discharge Instructions (Signed)
Use your inhaler 2 puffs every 3-4 hours as needed for cough or trouble breathing.

## 2017-07-23 ENCOUNTER — Emergency Department (HOSPITAL_COMMUNITY)
Admission: EM | Admit: 2017-07-23 | Discharge: 2017-07-24 | Disposition: A | Discharge status: Home / Self Care | Payer: Self-pay | Attending: Emergency Medicine | Admitting: Emergency Medicine

## 2017-07-23 ENCOUNTER — Encounter (HOSPITAL_COMMUNITY): Payer: Self-pay

## 2017-07-23 ENCOUNTER — Emergency Department (HOSPITAL_COMMUNITY): Payer: Self-pay

## 2017-07-23 DIAGNOSIS — B9689 Other specified bacterial agents as the cause of diseases classified elsewhere: Secondary | ICD-10-CM | POA: Insufficient documentation

## 2017-07-23 DIAGNOSIS — R1032 Left lower quadrant pain: Secondary | ICD-10-CM | POA: Insufficient documentation

## 2017-07-23 DIAGNOSIS — Z79899 Other long term (current) drug therapy: Secondary | ICD-10-CM | POA: Insufficient documentation

## 2017-07-23 DIAGNOSIS — N760 Acute vaginitis: Secondary | ICD-10-CM | POA: Insufficient documentation

## 2017-07-23 DIAGNOSIS — J45909 Unspecified asthma, uncomplicated: Secondary | ICD-10-CM | POA: Insufficient documentation

## 2017-07-23 LAB — COMPREHENSIVE METABOLIC PANEL
ALBUMIN: 3.7 g/dL (ref 3.5–5.0)
ALT: 14 U/L (ref 14–54)
AST: 20 U/L (ref 15–41)
Alkaline Phosphatase: 91 U/L (ref 38–126)
Anion gap: 7 (ref 5–15)
BILIRUBIN TOTAL: 0.3 mg/dL (ref 0.3–1.2)
BUN: 6 mg/dL (ref 6–20)
CHLORIDE: 107 mmol/L (ref 101–111)
CO2: 23 mmol/L (ref 22–32)
Calcium: 8.9 mg/dL (ref 8.9–10.3)
Creatinine, Ser: 0.94 mg/dL (ref 0.44–1.00)
GFR calc non Af Amer: >60 mL/min (ref >60)
Glucose, Bld: 103 mg/dL — ABNORMAL HIGH (ref 65–99)
POTASSIUM: 3.8 mmol/L (ref 3.5–5.1)
SODIUM: 137 mmol/L (ref 135–145)
Total Protein: 7.4 g/dL (ref 6.5–8.1)

## 2017-07-23 LAB — WET PREP, GENITAL
Sperm: NONE SEEN
Trich, Wet Prep: NONE SEEN
WBC WET PREP: NONE SEEN
Yeast Wet Prep HPF POC: NONE SEEN

## 2017-07-23 LAB — CBC
HEMATOCRIT: 39.1 % (ref 36.0–46.0)
Hemoglobin: 13.1 g/dL (ref 12.0–15.0)
MCH: 29.2 pg (ref 26.0–34.0)
MCHC: 33.5 g/dL (ref 30.0–36.0)
MCV: 87.1 fL (ref 78.0–100.0)
Platelets: 332 10*3/uL (ref 150–400)
RBC: 4.49 MIL/uL (ref 3.87–5.11)
RDW: 13 % (ref 11.5–15.5)
WBC: 7.2 10*3/uL (ref 4.0–10.5)

## 2017-07-23 LAB — URINALYSIS, ROUTINE W REFLEX MICROSCOPIC
BILIRUBIN URINE: NEGATIVE
Glucose, UA: NEGATIVE mg/dL
KETONES UR: NEGATIVE mg/dL
LEUKOCYTES UA: NEGATIVE
Nitrite: NEGATIVE
PROTEIN: NEGATIVE mg/dL
Specific Gravity, Urine: 1.031 — ABNORMAL HIGH (ref 1.005–1.030)
pH: 6 (ref 5.0–8.0)

## 2017-07-23 LAB — I-STAT BETA HCG BLOOD, ED (MC, WL, AP ONLY)

## 2017-07-23 LAB — LIPASE, BLOOD: Lipase: 21 U/L (ref 11–51)

## 2017-07-23 MED ORDER — HYDROCODONE-ACETAMINOPHEN 5-325 MG PO TABS
1.0000 | ORAL_TABLET | Freq: Once | ORAL | Status: AC
  Administered 2017-07-24: 1 via ORAL
  Filled 2017-07-23: qty 1

## 2017-07-23 MED ORDER — METRONIDAZOLE 500 MG PO TABS: 500.0000 mg | ORAL_TABLET | Freq: Two times a day (BID) | ORAL | 0 refills | Status: AC

## 2017-07-23 MED ORDER — METRONIDAZOLE 500 MG PO TABS
500.0000 mg | ORAL_TABLET | Freq: Once | ORAL | Status: AC
  Administered 2017-07-24: 500 mg via ORAL
  Filled 2017-07-23: qty 1

## 2017-07-23 MED ORDER — HYDROCODONE-ACETAMINOPHEN 5-325 MG PO TABS
1.0000 | ORAL_TABLET | Freq: Four times a day (QID) | ORAL | 0 refills | Status: AC | PRN
Start: 2017-07-23 — End: 2017-07-26

## 2017-07-23 NOTE — ED Triage Notes (Signed)
Onset 1 year bi-monthly LLQ abd pain.  Onset 1 week constant LLQ abd pain, nausea, vomited x 1 yesterday.

## 2017-07-23 NOTE — ED Provider Notes (Signed)
[facility] DEPARTMENT Provider Note   CSN: [id number] Arrival date & time: 07/23/17  1739     History   Chief Complaint Chief Complaint  Patient presents with  . Abdominal Pain    HPI Rebecca Day is a 31 y.o. female.  The history is provided by the patient.  Abdominal Pain   This is a new problem. Episode onset: 1 week. The problem occurs constantly. The problem has been gradually worsening. The pain is associated with an unknown factor. The pain is located in the LLQ. The quality of the pain is dull, colicky and sharp. The pain is at a severity of 7/10. Pertinent negatives include fever, diarrhea, melena, nausea, vomiting, constipation, dysuria, frequency, hematuria and arthralgias. The symptoms are aggravated by certain positions. Nothing relieves the symptoms.  Patient states that she has been told that she has a ovarian cyst but does not member which side it is on.  Has a history of nephrolithiasis but states that this pain is different.  Denies vaginal discharge.  Has had some vaginal bleeding but states that she is at the end of her period.  She states her pain is constantly dull but intermittently becomes very sharp and intense.  She also states that the pain radiates into her left groin but worsens when she lays on her left side or moves her leg in a certain position.  Past Medical History:  Diagnosis Date  . Asthma   . Elevated blood sugar   . Kidney stones   . Lupus    questionable, no meds or treatment per pt  . 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[place])   . Ovarian cyst     Patient Active Problem List   Diagnosis Date Noted  . Family history of spina bifida 07/06/2011    Past Surgical History:  Procedure Laterality Date  . CESAREAN SECTION      OB History    Gravida Para Term Preterm AB Living   7 3 3  0 2 2   SAB TAB Ectopic Multiple Live Births   2 0 0 0 2       Home Medications    Prior to Admission medications   Medication Sig Start  Date End Date Taking? Authorizing Provider  acetaminophen (TYLENOL) 500 MG tablet Take 1,000 mg by mouth every 6 (six) hours as needed for moderate pain.    [provider]  albuterol (PROVENTIL HFA;VENTOLIN HFA) 108 (90 Base) MCG/ACT inhaler Inhale 2 puffs into the lungs every 4 (four) hours as needed for wheezing or shortness of breath. 06/24/17   Daleen Bo, MD  HYDROcodone-acetaminophen (NORCO/VICODIN) 5-325 MG tablet Take 1 tablet by mouth every 6 (six) hours as needed for up to 3 days. 07/23/17 07/26/17  Chance Munter Mal[place], MD  hydroxychloroquine (PLAQUENIL) 200 MG tablet Take 1 tablet (200 mg total) by mouth 2 (two) times daily. Patient not taking: Reported on 06/24/2017 02/09/17   Junius Creamer, NP  metroNIDAZOLE (FLAGYL) 500 MG tablet Take 1 tablet (500 mg total) by mouth 2 (two) times daily for 7 days. 07/23/17 07/30/17  Elek Holderness [place], MD  predniSONE (DELTASONE) 20 MG tablet Take 1 tablet (20 mg total) by mouth 2 (two) times daily. 06/24/17   Daleen Bo, MD    Family History History reviewed. No pertinent family history.  Social History Social History   Tobacco Use  . Smoking status: Never Smoker  . Smokeless tobacco: Never Used  Substance Use Topics  . Alcohol use: Yes  Comment: socially  . Drug use: No     Allergies   Penicillins   Review of Systems Review of Systems  Constitutional: Negative for chills and fever.  HENT: Negative for ear pain and sore throat.   Eyes: Negative for pain and visual disturbance.  Respiratory: Negative for cough and shortness of breath.   Cardiovascular: Negative for chest pain and palpitations.  Gastrointestinal: Positive for abdominal pain. Negative for constipation, diarrhea, melena, nausea and vomiting.  Genitourinary: Positive for pelvic pain. Negative for difficulty urinating, dysuria, frequency, hematuria, menstrual problem, vaginal bleeding, vaginal discharge and vaginal pain.  Musculoskeletal: Negative for  arthralgias and back pain.  Skin: Negative for color change and rash.  Neurological: Negative for seizures and syncope.  All other systems reviewed and are negative.    Physical Exam Updated Vital Signs BP 112/63   Pulse 86   Temp 98.1 F (36.7 C) (Oral)   Resp 16   LMP 07/17/2017   SpO2 97%   Physical Exam  Constitutional: She appears well-developed and well-nourished. No distress.  Obese female.  HENT:  Head: Normocephalic and atraumatic.  Eyes: Conjunctivae are normal.  Neck: Neck supple.  Cardiovascular: Normal rate and regular rhythm.  No murmur heard. Pulmonary/Chest: Effort normal and breath sounds normal. No respiratory distress.  Abdominal: Soft. She exhibits no distension. There is tenderness in the left lower quadrant. There is no rigidity, no guarding, no CVA tenderness, no tenderness at McBurney's point and negative Murphy's sign.  Genitourinary: Vagina normal and uterus normal. Cervix exhibits no motion tenderness, no discharge and no friability. Right adnexum displays no tenderness. Left adnexum displays tenderness. No vaginal discharge found.  Genitourinary Comments: Unable to feel for mass in adnexa due to body habitus  Musculoskeletal: She exhibits no edema.  Neurological: She is alert.  Skin: Skin is warm and dry.  Psychiatric: She has a normal mood and affect.  Nursing note and vitals reviewed.    ED Treatments / Results  Labs (all labs ordered are listed, but only abnormal results are displayed) Labs Reviewed  WET PREP, GENITAL - Abnormal; Notable for the following components:      Result Value   Clue Cells Wet Prep HPF POC PRESENT (*)    All other components within normal limits  COMPREHENSIVE METABOLIC PANEL - Abnormal; Notable for the following components:   Glucose, Bld 103 (*)    All other components within normal limits  URINALYSIS, ROUTINE W REFLEX MICROSCOPIC - Abnormal; Notable for the following components:   Specific Gravity, Urine  1.031 (*)    Hgb urine dipstick MODERATE (*)    Bacteria, UA RARE (*)    Squamous Epithelial / LPF 0-5 (*)    All other components within normal limits  LIPASE, BLOOD  CBC  I-STAT BETA HCG BLOOD, ED ([facility], [facility], [facility] ONLY)  GC/CHLAMYDIA PROBE AMP ([place]) NOT AT [facility]    EKG  EKG Interpretation None       Radiology US Transvaginal Non-ob  Result Date: 07/23/2017 CLINICAL DATA:  Left adnexal tenderness. Left lower quadrant pain for 1 week. EXAM: TRANSABDOMINAL AND TRANSVAGINAL ULTRASOUND OF PELVIS DOPPLER ULTRASOUND OF OVARIES TECHNIQUE: Both transabdominal and transvaginal ultrasound examinations of the pelvis were performed. Transabdominal technique was performed for global imaging of the pelvis including uterus, ovaries, adnexal regions, and pelvic cul-de-sac. It was necessary to proceed with endovaginal exam following the transabdominal exam to visualize the ovaries. Color and duplex Doppler ultrasound was utilized to evaluate blood flow to the ovaries.  COMPARISON:  None. FINDINGS: Uterus Measurements: 9.1 x 5.3 x 6.0 cm. No fibroids or other mass visualized. Caesarean section scar noted. Nabothian cyst in the cervix. Endometrium Thickness: 9 mm, normal.  No focal abnormality visualized. Right ovary Measurements: 3.4 x 1.9 x 3.1 cm. Peripheral distribution of follicles but no definite increased number. No cyst. Blood flow is noted. No adnexal mass. Left ovary Measurements: 3.8 x 2.6 x 2.8 cm. Peripheral distribution of follicles but not increased number. No cystic or solid mass. No adnexal mass. Pulsed Doppler evaluation of both ovaries demonstrates normal low-resistance arterial and venous waveforms. Other findings No abnormal free fluid. IMPRESSION: 1. No ovarian torsion or adnexal mass. 2. Peripheral distribution of follicles in both ovaries, which can be seen with polycystic ovarian syndrome, however no additional morphologic changes to suggest this. Electronically Signed   By: Jeb Levering M.D.   On: 07/23/2017 23:29   US Pelvis Complete  Result Date: 07/23/2017 CLINICAL DATA:  Left adnexal tenderness. Left lower quadrant pain for 1 week. EXAM: TRANSABDOMINAL AND TRANSVAGINAL ULTRASOUND OF PELVIS DOPPLER ULTRASOUND OF OVARIES TECHNIQUE: Both transabdominal and transvaginal ultrasound examinations of the pelvis were performed. Transabdominal technique was performed for global imaging of the pelvis including uterus, ovaries, adnexal regions, and pelvic cul-de-sac. It was necessary to proceed with endovaginal exam following the transabdominal exam to visualize the ovaries. Color and duplex Doppler ultrasound was utilized to evaluate blood flow to the ovaries. COMPARISON:  None. FINDINGS: Uterus Measurements: 9.1 x 5.3 x 6.0 cm. No fibroids or other mass visualized. Caesarean section scar noted. Nabothian cyst in the cervix. Endometrium Thickness: 9 mm, normal.  No focal abnormality visualized. Right ovary Measurements: 3.4 x 1.9 x 3.1 cm. Peripheral distribution of follicles but no definite increased number. No cyst. Blood flow is noted. No adnexal mass. Left ovary Measurements: 3.8 x 2.6 x 2.8 cm. Peripheral distribution of follicles but not increased number. No cystic or solid mass. No adnexal mass. Pulsed Doppler evaluation of both ovaries demonstrates normal low-resistance arterial and venous waveforms. Other findings No abnormal free fluid. IMPRESSION: 1. No ovarian torsion or adnexal mass. 2. Peripheral distribution of follicles in both ovaries, which can be seen with polycystic ovarian syndrome, however no additional morphologic changes to suggest this. Electronically Signed   By: Jeb Levering M.D.   On: 07/23/2017 23:29   [place] Art/ven Flow Abd Pelv Doppler  Result Date: 07/23/2017 CLINICAL DATA:  Left adnexal tenderness. Left lower quadrant pain for 1 week. EXAM: TRANSABDOMINAL AND TRANSVAGINAL ULTRASOUND OF PELVIS DOPPLER ULTRASOUND OF OVARIES TECHNIQUE: Both transabdominal  and transvaginal ultrasound examinations of the pelvis were performed. Transabdominal technique was performed for global imaging of the pelvis including uterus, ovaries, adnexal regions, and pelvic cul-de-sac. It was necessary to proceed with endovaginal exam following the transabdominal exam to visualize the ovaries. Color and duplex Doppler ultrasound was utilized to evaluate blood flow to the ovaries. COMPARISON:  None. FINDINGS: Uterus Measurements: 9.1 x 5.3 x 6.0 cm. No fibroids or other mass visualized. Caesarean section scar noted. Nabothian cyst in the cervix. Endometrium Thickness: 9 mm, normal.  No focal abnormality visualized. Right ovary Measurements: 3.4 x 1.9 x 3.1 cm. Peripheral distribution of follicles but no definite increased number. No cyst. Blood flow is noted. No adnexal mass. Left ovary Measurements: 3.8 x 2.6 x 2.8 cm. Peripheral distribution of follicles but not increased number. No cystic or solid mass. No adnexal mass. Pulsed Doppler evaluation of both ovaries demonstrates normal low-resistance arterial and venous  waveforms. Other findings No abnormal free fluid. IMPRESSION: 1. No ovarian torsion or adnexal mass. 2. Peripheral distribution of follicles in both ovaries, which can be seen with polycystic ovarian syndrome, however no additional morphologic changes to suggest this. Electronically Signed   By: Jeb Levering M.D.   On: 07/23/2017 23:29    Procedures Procedures (including critical care time)  Medications Ordered in ED Medications  HYDROcodone-acetaminophen (NORCO/VICODIN) 5-325 MG per tablet 1 tablet (not administered)  metroNIDAZOLE (FLAGYL) tablet 500 mg (not administered)     Initial Impression / Assessment and Plan / ED Course  I have reviewed the triage vital signs and the nursing notes.  Pertinent labs & imaging results that were available during my care of the patient were reviewed by me and considered in my medical decision making (see chart for  details).     31 year old female with the above history presenting with left lower quadrant/left adnexa pain.  She is afebrile hemodynamically stable.  Exam is as above notable for left adnexal tenderness.  Concern for ovarian torsion versus ruptured cyst versus nephrolithiasis.  Pregnancy test negative.  UA with no signs of UTI but has moderate blood.  CBC, CMP, lipase within normal limits.  Will order pelvic ultrasound as I believe her pain is likely pelvic in origin at this time.  Low suspicion for cholecystitis or appendicitis as her exam is inconsistent with this  Wet prep Positive for clue cells. Pelvic ultrasound negative for signs of torsion, TOA but does show multiple follicles and cysts concerning for PCOS.  She has not had any fevers or diarrhea, low suspicion for colitis.  Pain is on the left side radiating to her groin, could be from nephrolithiasis as she has blood in her urine.  Will treat her for vaginosis with Flagyl and give a short course of narcotics for nephrolithiasis.  Patient amenable to plan.  Will follow with PCP.  Strict return precautions given.    Final Clinical Impressions(s) / ED Diagnoses   Final diagnoses:  Left lower quadrant pain  Bacterial vaginosis    ED Discharge Orders        Ordered    metroNIDAZOLE (FLAGYL) 500 MG tablet  2 times daily     07/23/17 2343    HYDROcodone-acetaminophen (NORCO/VICODIN) 5-325 MG tablet  Every 6 hours PRN     07/23/17 2343       Jennings Corado Mali, MD 07/23/17 2351    Elnora Morrison, MD 07/24/17 1527

## 2017-07-25 LAB — GC/CHLAMYDIA PROBE AMP (~~LOC~~) NOT AT ARMC
Chlamydia: NEGATIVE
Neisseria Gonorrhea: NEGATIVE

## 2017-07-29 ENCOUNTER — Emergency Department (HOSPITAL_COMMUNITY): Payer: Self-pay

## 2017-07-29 ENCOUNTER — Emergency Department (HOSPITAL_COMMUNITY)
Admission: EM | Admit: 2017-07-29 | Discharge: 2017-07-29 | Disposition: A | Discharge status: Home / Self Care | Payer: Self-pay | Attending: Emergency Medicine | Admitting: Emergency Medicine

## 2017-07-29 ENCOUNTER — Encounter (HOSPITAL_COMMUNITY): Payer: Self-pay | Admitting: *Deleted

## 2017-07-29 DIAGNOSIS — R1032 Left lower quadrant pain: Secondary | ICD-10-CM | POA: Insufficient documentation

## 2017-07-29 DIAGNOSIS — J45909 Unspecified asthma, uncomplicated: Secondary | ICD-10-CM | POA: Insufficient documentation

## 2017-07-29 LAB — URINALYSIS, ROUTINE W REFLEX MICROSCOPIC
BILIRUBIN URINE: NEGATIVE
Bacteria, UA: NONE SEEN
Glucose, UA: NEGATIVE mg/dL
Ketones, ur: NEGATIVE mg/dL
Leukocytes, UA: NEGATIVE
NITRITE: NEGATIVE
PROTEIN: NEGATIVE mg/dL
Specific Gravity, Urine: 1.021 (ref 1.005–1.030)
pH: 6 (ref 5.0–8.0)

## 2017-07-29 LAB — CBC
HEMATOCRIT: 39.7 % (ref 36.0–46.0)
HEMOGLOBIN: 13.5 g/dL (ref 12.0–15.0)
MCH: 29.5 pg (ref 26.0–34.0)
MCHC: 34 g/dL (ref 30.0–36.0)
MCV: 86.9 fL (ref 78.0–100.0)
Platelets: 323 10*3/uL (ref 150–400)
RBC: 4.57 MIL/uL (ref 3.87–5.11)
RDW: 12.8 % (ref 11.5–15.5)
WBC: 6.5 10*3/uL (ref 4.0–10.5)

## 2017-07-29 LAB — COMPREHENSIVE METABOLIC PANEL
ALK PHOS: 80 U/L (ref 38–126)
ALT: 15 U/L (ref 14–54)
ANION GAP: 8 (ref 5–15)
AST: 18 U/L (ref 15–41)
Albumin: 3.7 g/dL (ref 3.5–5.0)
BUN: 6 mg/dL (ref 6–20)
CALCIUM: 9.2 mg/dL (ref 8.9–10.3)
CO2: 21 mmol/L — AB (ref 22–32)
Chloride: 108 mmol/L (ref 101–111)
Creatinine, Ser: 0.72 mg/dL (ref 0.44–1.00)
GFR calc non Af Amer: >60 mL/min (ref >60)
Glucose, Bld: 79 mg/dL (ref 65–99)
POTASSIUM: 3.7 mmol/L (ref 3.5–5.1)
SODIUM: 137 mmol/L (ref 135–145)
Total Bilirubin: 0.5 mg/dL (ref 0.3–1.2)
Total Protein: 7.2 g/dL (ref 6.5–8.1)

## 2017-07-29 LAB — LIPASE, BLOOD: LIPASE: 17 U/L (ref 11–51)

## 2017-07-29 LAB — I-STAT BETA HCG BLOOD, ED (MC, WL, AP ONLY)

## 2017-07-29 MED ORDER — KETOROLAC TROMETHAMINE 30 MG/ML IJ SOLN
30.0000 mg | Freq: Once | INTRAMUSCULAR | Status: AC
  Administered 2017-07-29: 30 mg via INTRAVENOUS
  Filled 2017-07-29: qty 1

## 2017-07-29 MED ORDER — DICYCLOMINE HCL 20 MG PO TABS: 20.0000 mg | ORAL_TABLET | Freq: Two times a day (BID) | ORAL | 0 refills | Status: DC

## 2017-07-29 NOTE — ED Provider Notes (Signed)
Emergency Department Provider Note   I have reviewed the triage vital signs and the nursing notes.   HISTORY  Chief Complaint Abdominal Pain   HPI Rebecca Day is a 31 y.o. female with PMH Mof lupus nephritis and kidney stones presents to the emergency department for evaluation of continued left lower quadrant abdominal pain.  Symptoms have been ongoing for the past 6 days.  She denies any vaginal bleeding or discharge.  No UTI symptoms.  The patient was seen in the emergency department on 11/24 with similar symptoms.  She had a transvaginal ultrasound and was discharged home with treatment for BV and pain medication.  She denies any diarrhea, nausea, vomiting.  No blood in the stool.  No similar symptoms in the past.   Past Medical History:  Diagnosis Date  . Asthma   . Elevated blood sugar   . Kidney stones   . Lupus    questionable, no meds or treatment per pt  . 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[place])   . Ovarian cyst     Patient Active Problem List   Diagnosis Date Noted  . Family history of spina bifida 07/06/2011    Past Surgical History:  Procedure Laterality Date  . CESAREAN SECTION      Current Outpatient Rx  . Order #: [id number] Class: Historical Med  . Order #: [id number] Class: Print  . Order #: [id number] Class: Print  . Order #: [id number] Class: Print  . Order #: [id number] Class: Print  . Order #: [id number] Class: Print    Allergies Penicillins  History reviewed. No pertinent family history.  Social History Social History   Tobacco Use  . Smoking status: Never Smoker  . Smokeless tobacco: Never Used  Substance Use Topics  . Alcohol use: Yes    Comment: socially  . Drug use: No    Review of Systems  Constitutional: No fever/chills Eyes: No visual changes. ENT: No sore throat. Cardiovascular: Denies chest pain. Respiratory: Denies shortness of breath. Gastrointestinal: Positive LLQ abdominal pain.  No nausea, no vomiting.  No diarrhea.  No  constipation. Genitourinary: Negative for dysuria. Musculoskeletal: Negative for back pain. Skin: Negative for rash. Neurological: Negative for headaches, focal weakness or numbness.  10-point ROS otherwise negative.  ____________________________________________   PHYSICAL EXAM:  VITAL SIGNS: ED Triage Vitals  Enc Vitals Group     BP 07/29/17 1023 (!) 144/94     Pulse Rate 07/29/17 1023 96     Resp 07/29/17 1023 18     Temp 07/29/17 1023 98.1 F (36.7 C)     Temp Source 07/29/17 1023 Oral     SpO2 07/29/17 1023 99 %     Weight 07/29/17 1026 290 lb (131.5 kg)     Height 07/29/17 1026 5\' 9"  (1.753 m)     Pain Score 07/29/17 1038 8   Constitutional: Alert and oriented. Well appearing and in no acute distress. Eyes: Conjunctivae are normal. Head: Atraumatic. Nose: No congestion/rhinnorhea. Mouth/Throat: Mucous membranes are moist. Neck: No stridor.  Cardiovascular: Normal rate, regular rhythm. Good peripheral circulation. Grossly normal heart sounds.   Respiratory: Normal respiratory effort.  No retractions. Lungs CTAB. Gastrointestinal: Soft with focal LLQ tenderness to palpation. No rebound or guarding.  No distention.  Musculoskeletal: No lower extremity tenderness nor edema. No gross deformities of extremities. Neurologic:  Normal speech and language. No gross focal neurologic deficits are appreciated.  Skin:  Skin is warm, dry and intact. No rash noted.  ____________________________________________   LABS (all labs ordered are listed, but only  abnormal results are displayed)  Labs Reviewed  COMPREHENSIVE METABOLIC PANEL - Abnormal; Notable for the following components:      Result Value   CO2 21 (*)    All other components within normal limits  URINALYSIS, ROUTINE W REFLEX MICROSCOPIC - Abnormal; Notable for the following components:   Hgb urine dipstick SMALL (*)    Squamous Epithelial / LPF 0-5 (*)    All other components within normal limits  LIPASE, BLOOD   CBC  I-STAT BETA HCG BLOOD, ED ([facility], [facility], [facility] ONLY)   ____________________________________________  RADIOLOGY  Ct Renal Stone Study  Result Date: 07/29/2017 CLINICAL DATA:  Left flank pain, nausea and vomiting for the past week. History of nephrolithiasis. EXAM: CT ABDOMEN AND PELVIS WITHOUT CONTRAST TECHNIQUE: Multidetector CT imaging of the abdomen and pelvis was performed following the standard protocol without IV contrast. COMPARISON:  03/19/2016 and pelvis ultrasound dated 07/23/2017. Renal ultrasound dated 11/18/2016. FINDINGS: Lower chest: Clear lung bases. Hepatobiliary: No focal liver abnormality is seen. No gallstones, gallbladder wall thickening, or biliary dilatation. Pancreas: Unremarkable. No pancreatic ductal dilatation or surrounding inflammatory changes. Spleen: Normal in size without focal abnormality. Adrenals/Urinary Tract: Adrenal glands are unremarkable. Kidneys are normal, without renal calculi, focal lesion, or hydronephrosis. The previously demonstrated left renal calculus is no longer seen. Bladder is unremarkable. Stomach/Bowel: Stomach is within normal limits. Appendix appears normal. No evidence of bowel wall thickening, distention, or inflammatory changes. Vascular/Lymphatic: No significant vascular findings are present. No enlarged abdominal or pelvic lymph nodes. Reproductive: Uterus and bilateral adnexa are unremarkable. Other: No abdominal wall hernia or abnormality. No abdominopelvic ascites. Musculoskeletal: Unremarkable. IMPRESSION: Normal examination. Electronically Signed   By: Claudie Revering M.D.   On: 07/29/2017 17:36    ____________________________________________   PROCEDURES  Procedure(s) performed:   Procedures  None ____________________________________________   INITIAL IMPRESSION / ASSESSMENT AND PLAN / ED COURSE  Pertinent labs & imaging results that were available during my care of the patient were reviewed by me and considered in my medical  decision making (see chart for details).  Patient presents emergency department for evaluation of left lower quadrant abdominal pain over the last 6 days.  She has focal tenderness in left lower quadrant without rebound or guarding.  Transvaginal ultrasound from 6 days ago showed no evidence of ovarian torsion or masses. Plan for CT renal protocol, labs, and Toradol for pain. No nausea at this time.   Differential diagnosis includes but is not exclusive to ectopic pregnancy, ovarian cyst, ovarian torsion, acute appendicitis, urinary tract infection, endometriosis, bowel obstruction, hernia, colitis, renal colic, gastroenteritis, volvulus etc.  CT negative. Plan for discharge home with plan for supportive care and PCP follow up.   At this time, I do not feel there is any life-threatening condition present. I have reviewed and discussed all results (EKG, imaging, lab, urine as appropriate), exam findings with patient. I have reviewed nursing notes and appropriate previous records.  I feel the patient is safe to be discharged home without further emergent workup. Discussed usual and customary return precautions. Patient and family (if present) verbalize understanding and are comfortable with this plan.  Patient will follow-up with their primary care provider. If they do not have a primary care provider, information for follow-up has been provided to them. All questions have been answered.  ____________________________________________  FINAL CLINICAL IMPRESSION(S) / ED DIAGNOSES  Final diagnoses:  Left lower quadrant pain     MEDICATIONS GIVEN DURING THIS VISIT:  Medications  ketorolac (TORADOL) 30  MG/ML injection 30 mg (30 mg Intravenous Given 07/29/17 1705)      Note:  This document was prepared using Dragon voice recognition software and may include unintentional dictation errors.  Nanda Quinton, MD Emergency Medicine    Kingsten Enfield, [facility], MD 07/30/17 [phone]

## 2017-07-29 NOTE — ED Triage Notes (Signed)
Pt reports LLQ pain. Was seen here on 11/24 for same and was told to return if not better. States pain is radiating into her thigh and still has nausea.

## 2017-07-29 NOTE — Discharge Instructions (Signed)

## 2017-10-25 ENCOUNTER — Encounter (HOSPITAL_BASED_OUTPATIENT_CLINIC_OR_DEPARTMENT_OTHER): Payer: Self-pay | Admitting: *Deleted

## 2017-10-25 ENCOUNTER — Emergency Department (HOSPITAL_BASED_OUTPATIENT_CLINIC_OR_DEPARTMENT_OTHER): Payer: Self-pay

## 2017-10-25 ENCOUNTER — Other Ambulatory Visit: Payer: Self-pay

## 2017-10-25 ENCOUNTER — Emergency Department (HOSPITAL_BASED_OUTPATIENT_CLINIC_OR_DEPARTMENT_OTHER)
Admission: EM | Admit: 2017-10-25 | Discharge: 2017-10-26 | Disposition: A | Discharge status: Home / Self Care | Payer: Self-pay | Attending: Emergency Medicine | Admitting: Emergency Medicine

## 2017-10-25 DIAGNOSIS — J45909 Unspecified asthma, uncomplicated: Secondary | ICD-10-CM | POA: Insufficient documentation

## 2017-10-25 DIAGNOSIS — Z79899 Other long term (current) drug therapy: Secondary | ICD-10-CM | POA: Insufficient documentation

## 2017-10-25 DIAGNOSIS — J209 Acute bronchitis, unspecified: Secondary | ICD-10-CM | POA: Insufficient documentation

## 2017-10-25 NOTE — ED Triage Notes (Signed)
Cough for 6 weeks. Green sputum.

## 2017-10-26 MED ORDER — NAPROXEN 375 MG PO TABS: ORAL_TABLET | ORAL | 0 refills | Status: DC

## 2017-10-26 MED ORDER — ALBUTEROL SULFATE HFA 108 (90 BASE) MCG/ACT IN AERS
2.0000 | INHALATION_SPRAY | RESPIRATORY_TRACT | Status: DC | PRN
  Administered 2017-10-26: 2 via RESPIRATORY_TRACT
  Filled 2017-10-26: qty 6.7

## 2017-10-26 MED ORDER — ONDANSETRON 8 MG PO TBDP
8.0000 mg | ORAL_TABLET | Freq: Once | ORAL | Status: AC
  Administered 2017-10-26: 8 mg via ORAL
  Filled 2017-10-26: qty 1

## 2017-10-26 MED ORDER — ONDANSETRON 8 MG PO TBDP: 8.0000 mg | ORAL_TABLET | Freq: Three times a day (TID) | ORAL | 0 refills | Status: DC | PRN

## 2017-10-26 NOTE — ED Provider Notes (Signed)
[facility] DEPT [facility] Provider Note: Rebecca Spurling, MD, FACEP  CSN: [id number] MRN: [id number] ARRIVAL: 10/25/17 at 2126 ROOM: [place]  10/26/17 12:24 AM Rebecca Day is a 32 y.o. female with a one-week history of flulike symptoms.  Specifically she has had a cough productive of green sputum, chest wall soreness with coughing, fever which has been improving, scratchy throat and vomiting.  She has not had diarrhea or nasal congestion.  She has been taking Tylenol for her fever but no NSAIDs.  She rates her chest pain as a 3 out of 10 when coughing.   Past Medical History:  Diagnosis Date  . Asthma   . Elevated blood sugar   . Kidney stones   . Lupus    questionable, no meds or treatment per pt  . 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[place])   . Ovarian cyst     Past Surgical History:  Procedure Laterality Date  . CESAREAN SECTION      No family history on file.  Social History   Tobacco Use  . Smoking status: Never Smoker  . Smokeless tobacco: Never Used  Substance Use Topics  . Alcohol use: Yes    Comment: socially  . Drug use: No    Prior to Admission medications   Medication Sig Start Date End Date Taking? Authorizing Provider  acetaminophen (TYLENOL) 500 MG tablet Take 1,000 mg by mouth every 6 (six) hours as needed for moderate pain.   Yes [provider]  hydroxychloroquine (PLAQUENIL) 200 MG tablet Take 1 tablet (200 mg total) by mouth 2 (two) times daily. 02/09/17  Yes Junius Creamer, NP  albuterol (PROVENTIL HFA;VENTOLIN HFA) 108 (90 Base) MCG/ACT inhaler Inhale 2 puffs into the lungs every 4 (four) hours as needed for wheezing or shortness of breath. 06/24/17   Daleen Bo, MD  dicyclomine (BENTYL) 20 MG tablet Take 1 tablet (20 mg total) by mouth 2 (two) times daily. 07/29/17   Long, Wonda Olds, MD  predniSONE (DELTASONE) 20 MG tablet Take 1 tablet (20 mg total) by mouth 2 (two) times daily. 06/24/17   Daleen Bo, MD    Allergies Penicillins   REVIEW OF SYSTEMS  Negative except as noted here or in the History of Present Illness.   PHYSICAL EXAMINATION  Initial Vital Signs Height 5\' 9"  (1.753 m), weight 124.3 kg (274 lb), last menstrual period 10/19/2017, unknown if currently breastfeeding.  Examination General: Well-developed, well-nourished female in no acute distress; appearance consistent with age of record HENT: normocephalic; atraumatic; no pharyngeal erythema or exudate Eyes: pupils equal, round and reactive to light; extraocular muscles intact Neck: supple Heart: regular rate and rhythm Lungs: Decreased air movement bilaterally without wheezing; frequent cough Abdomen: soft; nondistended; nontender; bowel sounds present Extremities: No deformity; full range of motion; pulses normal Neurologic: Awake, alert and oriented; motor function intact in all extremities and symmetric; no facial droop Skin: Warm and dry Psychiatric: Normal mood and affect   RESULTS  Summary of this visit's results, reviewed by myself:   EKG Interpretation  Date/Time:    Ventricular Rate:    PR Interval:    QRS Duration:   QT Interval:    QTC Calculation:   R Axis:     Text Interpretation:        Laboratory Studies: No results found for this or any previous visit (from the past 24 hour(s)). Imaging Studies: Dg Chest 2 View  Result Date: 10/25/2017 CLINICAL  DATA:  Productive cough EXAM: CHEST  2 VIEW COMPARISON:  06/24/2017 FINDINGS: Mild bronchitic changes. No focal opacity or effusion. Stable cardiomediastinal silhouette. No pneumothorax. IMPRESSION: Mild bronchitic changes.  No focal pulmonary infiltrate. Electronically Signed   By: Donavan Foil M.D.   On: 10/25/2017 22:06    ED COURSE  Nursing notes and initial vitals signs, including pulse oximetry, reviewed.  Vitals:   10/25/17 2138  Weight: 124.3 kg (274 lb)  Height: 5\' 9"  (1.753 m)   We will treat with an albuterol  inhaler and naproxen for chest wall pain.  Symptoms are consistent with a persistent viral illness.  I do not believe antibiotics are indicated at this time.  PROCEDURES    ED DIAGNOSES     ICD-10-CM   1. Acute bronchitis with bronchospasm J20.9        Rebecca Day, Jenny Reichmann, MD 10/26/17 [phone]

## 2018-06-15 DIAGNOSIS — Z79899 Other long term (current) drug therapy: Secondary | ICD-10-CM | POA: Insufficient documentation

## 2018-06-15 DIAGNOSIS — R0781 Pleurodynia: Secondary | ICD-10-CM | POA: Insufficient documentation

## 2018-06-15 DIAGNOSIS — R0789 Other chest pain: Secondary | ICD-10-CM | POA: Diagnosis present

## 2018-06-15 DIAGNOSIS — M329 Systemic lupus erythematosus, unspecified: Secondary | ICD-10-CM | POA: Diagnosis not present

## 2018-06-15 NOTE — ED Triage Notes (Signed)
Pt from home with c/o chest pain that began yesterday. Pt states she hs been alternating tylenol and ibuprofen. Pt states this has not been effective in managing pain. Pt states pain is in left side of chest and worse with inspiration. Pt does not smoke and does not use BC. Pt has clear s1 and s2 sounds. Pt has clear lung sounds

## 2018-06-16 ENCOUNTER — Emergency Department (HOSPITAL_COMMUNITY): Payer: Medicaid Other

## 2018-06-16 ENCOUNTER — Other Ambulatory Visit: Payer: Self-pay

## 2018-06-16 ENCOUNTER — Encounter (HOSPITAL_COMMUNITY): Payer: Self-pay | Admitting: Emergency Medicine

## 2018-06-16 ENCOUNTER — Emergency Department (HOSPITAL_COMMUNITY)
Admission: EM | Admit: 2018-06-16 | Discharge: 2018-06-16 | Disposition: A | Discharge status: Home / Self Care | Payer: Medicaid Other | Attending: Emergency Medicine | Admitting: Emergency Medicine

## 2018-06-16 DIAGNOSIS — R0781 Pleurodynia: Secondary | ICD-10-CM

## 2018-06-16 DIAGNOSIS — M329 Systemic lupus erythematosus, unspecified: Secondary | ICD-10-CM

## 2018-06-16 LAB — BASIC METABOLIC PANEL
Anion gap: 10 (ref 5–15)
BUN: 12 mg/dL (ref 6–20)
CALCIUM: 10.3 mg/dL (ref 8.9–10.3)
CHLORIDE: 108 mmol/L (ref 98–111)
CO2: 23 mmol/L (ref 22–32)
CREATININE: 0.89 mg/dL (ref 0.44–1.00)
GFR calc Af Amer: >60 mL/min (ref >60)
GFR calc non Af Amer: >60 mL/min (ref >60)
Glucose, Bld: 96 mg/dL (ref 70–99)
Potassium: 4.2 mmol/L (ref 3.5–5.1)
Sodium: 141 mmol/L (ref 135–145)

## 2018-06-16 LAB — CBC
HEMATOCRIT: 43.3 % (ref 36.0–46.0)
Hemoglobin: 14.1 g/dL (ref 12.0–15.0)
MCH: 29.3 pg (ref 26.0–34.0)
MCHC: 32.6 g/dL (ref 30.0–36.0)
MCV: 90 fL (ref 80.0–100.0)
PLATELETS: 306 10*3/uL (ref 150–400)
RBC: 4.81 MIL/uL (ref 3.87–5.11)
RDW: 12.4 % (ref 11.5–15.5)
WBC: 7.5 10*3/uL (ref 4.0–10.5)
nRBC: 0 % (ref 0.0–0.2)

## 2018-06-16 LAB — POCT I-STAT TROPONIN I: Troponin i, poc: 0 ng/mL (ref 0.00–0.08)

## 2018-06-16 LAB — I-STAT BETA HCG BLOOD, ED (NOT ORDERABLE): I-stat hCG, quantitative: <5 m[IU]/mL (ref <5)

## 2018-06-16 MED ORDER — PREDNISONE 10 MG PO TABS: 50.0000 mg | ORAL_TABLET | Freq: Every day | ORAL | 0 refills | Status: DC

## 2018-06-16 MED ORDER — IBUPROFEN 400 MG PO TABS: 400.0000 mg | ORAL_TABLET | Freq: Four times a day (QID) | ORAL | 0 refills | Status: DC | PRN

## 2018-06-16 MED ORDER — NAPROXEN 500 MG PO TABS
500.0000 mg | ORAL_TABLET | Freq: Once | ORAL | Status: AC
  Administered 2018-06-16: 500 mg via ORAL
  Filled 2018-06-16: qty 1

## 2018-06-16 MED ORDER — PREDNISONE 20 MG PO TABS
60.0000 mg | ORAL_TABLET | Freq: Once | ORAL | Status: AC
  Administered 2018-06-16: 60 mg via ORAL
  Filled 2018-06-16: qty 3

## 2018-06-16 NOTE — ED Provider Notes (Signed)
[facility] DEPT Provider Note   CSN: [id number] Arrival date & time: 06/15/18  2314     History   Chief Complaint Chief Complaint  Patient presents with  . Chest Pain    HPI Rebecca Day is a 32 y.o. female.  HPI 32 year old female with history of lupus on Plaquenil comes in with chief complaint of chest pain.  Patient reports 2 days of generalized chest discomfort that is worse with deep inspiration.  Patient describes her chest discomfort as tightness.  She does not have any associated shortness of breath, cough.  Patient reports that she has had a PE in the past and also pericarditis and lupus flareup causing pleurisy.  Her current pain is not severe enough like it was with pericarditis and it is not worse with laying flat.  Her PE had presented with shortness of breath mainly and hypoxia, and at this time she does not have any shortness of breath.   Past Medical History:  Diagnosis Date  . Asthma   . Elevated blood sugar   . Kidney stones   . Lupus ([place])    questionable, no meds or treatment per pt  . 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[place])   . Ovarian cyst     Patient Active Problem List   Diagnosis Date Noted  . Family history of spina bifida 07/06/2011    Past Surgical History:  Procedure Laterality Date  . CESAREAN SECTION       OB History    Gravida  7   Para  3   Term  3   Preterm  0   AB  2   Living  2     SAB  2   TAB  0   Ectopic  0   Multiple  0   Live Births  2            Home Medications    Prior to Admission medications   Medication Sig Start Date End Date Taking? Authorizing Provider  acetaminophen (TYLENOL) 500 MG tablet Take 1,000 mg by mouth every 6 (six) hours as needed for moderate pain.   Yes [provider]  buPROPion (WELLBUTRIN XL) 150 MG 24 hr tablet Take 150 mg by mouth daily. 05/26/18  Yes [provider]  hydroxychloroquine (PLAQUENIL) 200 MG tablet Take 1 tablet (200 mg  total) by mouth 2 (two) times daily. 02/09/17  Yes Junius Creamer, NP  ibuprofen (ADVIL,MOTRIN) 400 MG tablet Take 1 tablet (400 mg total) by mouth every 6 (six) hours as needed. 06/16/18   Varney Biles, MD  naproxen (NAPROSYN) 375 MG tablet Take 1 tablet twice daily with food as needed for chest wall pain. Patient not taking: Reported on 06/16/2018 10/26/17   Molpus, Jenny Reichmann, MD  ondansetron (ZOFRAN ODT) 8 MG disintegrating tablet Take 1 tablet (8 mg total) by mouth every 8 (eight) hours as needed for nausea or vomiting. Patient not taking: Reported on 06/16/2018 10/26/17   Molpus, Jenny Reichmann, MD  predniSONE (DELTASONE) 10 MG tablet Take 5 tablets (50 mg total) by mouth daily. 06/16/18   Varney Biles, MD    Family History No family history on file.  Social History Social History   Tobacco Use  . Smoking status: Never Smoker  . Smokeless tobacco: Never Used  Substance Use Topics  . Alcohol use: Yes    Comment: socially  . Drug use: No     Allergies   Penicillins   Review of Systems Review of Systems  Constitutional: Positive for activity change.  Respiratory: Negative for cough and shortness of breath.   Cardiovascular: Positive for chest pain.  Gastrointestinal: Negative for nausea and vomiting.  Allergic/Immunologic: Positive for immunocompromised state.  Hematological: Does not bruise/bleed easily.    Physical Exam Updated Vital Signs BP (!) 98/47 (BP Location: Right Arm)   Pulse 83   Temp 98.3 F (36.8 C) (Oral)   Resp 18   Ht 5' 9.5" (1.765 m)   Wt 127 kg   LMP 05/28/2018   SpO2 99%   BMI 40.76 kg/m   Physical Exam  Constitutional: She is oriented to person, place, and time. She appears well-developed.  HENT:  Head: Normocephalic and atraumatic.  Eyes: EOM are normal.  Neck: Normal range of motion. Neck supple.  Cardiovascular: Normal rate, intact distal pulses and normal pulses.  Pulmonary/Chest: Effort normal. She has no decreased breath sounds. She has no  wheezes. She has no rhonchi. She has no rales.  Abdominal: Bowel sounds are normal.  Musculoskeletal:       Right lower leg: She exhibits no tenderness and no edema.       Left lower leg: She exhibits no tenderness and no edema.  Neurological: She is alert and oriented to person, place, and time.  Skin: Skin is warm and dry.  Nursing note and vitals reviewed.    ED Treatments / Results  Labs (all labs ordered are listed, but only abnormal results are displayed) Labs Reviewed  BASIC METABOLIC PANEL  CBC  I-STAT TROPONIN, ED  I-STAT BETA HCG BLOOD, ED ([facility], [facility], [facility] ONLY)  I-STAT BETA HCG BLOOD, ED (NOT ORDERABLE)  POCT I-STAT TROPONIN I    EKG EKG Interpretation  Date/Time:  Thursday June 15 2018 23:48:59 EDT Ventricular Rate:  84 PR Interval:    QRS Duration: 79 QT Interval:  353 QTC Calculation: 418 R Axis:   74 Text Interpretation:  Sinus rhythm No acute changes normal intervals Confirmed by Varney Biles [phone]) on 06/16/2018 5:36:36 AM   Radiology Dg Chest 2 View  Result Date: 06/16/2018 CLINICAL DATA:  Chest pain EXAM: CHEST - 2 VIEW COMPARISON:  10/25/2017 FINDINGS: The heart size and mediastinal contours are within normal limits. Both lungs are clear. The visualized skeletal structures are unremarkable. IMPRESSION: No active cardiopulmonary disease. Electronically Signed   By: Donavan Foil M.D.   On: 06/16/2018 00:11    Procedures Procedures (including critical care time)  Medications Ordered in ED Medications  predniSONE (DELTASONE) tablet 60 mg (60 mg Oral Given 06/16/18 0620)  naproxen (NAPROSYN) tablet 500 mg (500 mg Oral Given 06/16/18 0620)     Initial Impression / Assessment and Plan / ED Course  I have reviewed the triage vital signs and the nursing notes.  Pertinent labs & imaging results that were available during my care of the patient were reviewed by me and considered in my medical decision making (see chart for details).      32 year old female comes in with chief complaint of chest pain.  She has history of PE, lupus with complications of pleurisy and pericarditis in the past.  She has had 2 days of chest tightness that is worse with deep inspiration.  The symptoms do not appear to be positional and are not similar to her pericarditis, therefore I do not think she has pericarditis.  EKG is not showing any evidence of pericarditis either.  Between PE and lupus flareup causing pleurisy, the latter is more likely.  Patient has noted rash  develop over her face over the past 2 days as well which is consistent with lupus flareup.  Patient is comfortable with a conservative management of treating this as a lupus flareup, and for her to return to the ER if she starts having worsening shortness of breath or chest pain despite medications.  She has no hypoxia, tachypnea, tachycardia which is all reassuring to [place].  Strict ER return precautions discussed again and patient is stable for discharge with prednisone and anti-inflammatory medications.  Final Clinical Impressions(s) / ED Diagnoses   Final diagnoses:  Pleuritic chest pain  Lupus [facility])    ED Discharge Orders         Ordered    ibuprofen (ADVIL,MOTRIN) 400 MG tablet  Every 6 hours PRN     06/16/18 0645    predniSONE (DELTASONE) 10 MG tablet  Daily     06/16/18 0645           Varney Biles, MD 06/16/18 [phone]

## 2018-06-16 NOTE — Discharge Instructions (Addendum)
We signed the ER for chest pain.  We think that your symptoms are because of a lupus flareup. Take the medications prescribed.  Please contact your rheumatologist about your symptoms, and get a close follow-up.  Return to the emergency room if you start having worsening of your pain, worsening in shortness of breath, dizziness, near fainting.

## 2018-09-28 ENCOUNTER — Ambulatory Visit: Payer: Self-pay | Admitting: Nurse Practitioner

## 2018-10-19 ENCOUNTER — Encounter (HOSPITAL_COMMUNITY): Payer: Self-pay | Admitting: *Deleted

## 2018-10-19 ENCOUNTER — Inpatient Hospital Stay (HOSPITAL_COMMUNITY)
Admission: AD | Admit: 2018-10-19 | Discharge: 2018-10-19 | Disposition: A | Discharge status: Home / Self Care | Payer: Medicaid Other | Attending: Obstetrics and Gynecology | Admitting: Obstetrics and Gynecology

## 2018-10-19 ENCOUNTER — Inpatient Hospital Stay (HOSPITAL_COMMUNITY): Payer: Medicaid Other

## 2018-10-19 DIAGNOSIS — O209 Hemorrhage in early pregnancy, unspecified: Secondary | ICD-10-CM | POA: Diagnosis present

## 2018-10-19 DIAGNOSIS — O039 Complete or unspecified spontaneous abortion without complication: Secondary | ICD-10-CM

## 2018-10-19 DIAGNOSIS — Z880 Allergy status to penicillin: Secondary | ICD-10-CM | POA: Insufficient documentation

## 2018-10-19 DIAGNOSIS — Z3A11 11 weeks gestation of pregnancy: Secondary | ICD-10-CM | POA: Diagnosis not present

## 2018-10-19 LAB — CBC
HCT: 38.7 % (ref 36.0–46.0)
Hemoglobin: 12.7 g/dL (ref 12.0–15.0)
MCH: 30 pg (ref 26.0–34.0)
MCHC: 32.8 g/dL (ref 30.0–36.0)
MCV: 91.3 fL (ref 80.0–100.0)
PLATELETS: 327 10*3/uL (ref 150–400)
RBC: 4.24 MIL/uL (ref 3.87–5.11)
RDW: 13 % (ref 11.5–15.5)
WBC: 6.8 10*3/uL (ref 4.0–10.5)
nRBC: 0 % (ref 0.0–0.2)

## 2018-10-19 LAB — WET PREP, GENITAL
Clue Cells Wet Prep HPF POC: NONE SEEN
Sperm: NONE SEEN
Trich, Wet Prep: NONE SEEN
Yeast Wet Prep HPF POC: NONE SEEN

## 2018-10-19 LAB — URINALYSIS, ROUTINE W REFLEX MICROSCOPIC
Bilirubin Urine: NEGATIVE
GLUCOSE, UA: NEGATIVE mg/dL
Ketones, ur: NEGATIVE mg/dL
NITRITE: POSITIVE — AB
PROTEIN: NEGATIVE mg/dL
Specific Gravity, Urine: 1.02 (ref 1.005–1.030)
pH: 6 (ref 5.0–8.0)

## 2018-10-19 LAB — TYPE AND SCREEN
ABO/RH(D): O POS
Antibody Screen: NEGATIVE

## 2018-10-19 LAB — POCT PREGNANCY, URINE: PREG TEST UR: POSITIVE — AB

## 2018-10-19 LAB — HCG, QUANTITATIVE, PREGNANCY: hCG, Beta Chain, Quant, S: 54 m[IU]/mL — ABNORMAL HIGH (ref <5)

## 2018-10-19 LAB — URINALYSIS, MICROSCOPIC (REFLEX)

## 2018-10-19 LAB — ABO/RH: ABO/RH(D): O POS

## 2018-10-19 MED ORDER — TRAMADOL HCL 50 MG PO TABS
100.0000 mg | ORAL_TABLET | Freq: Once | ORAL | Status: AC
  Administered 2018-10-19: 100 mg via ORAL
  Filled 2018-10-19: qty 2

## 2018-10-19 NOTE — Discharge Instructions (Signed)
Coping with Pregnancy Loss  Pregnancy loss can happen any time during a pregnancy. Often the cause is not known. It is rarely because of anything you did. Pregnancy loss in early pregnancy (during the first trimester) is called a miscarriage. This type of pregnancy loss is the most common. Pregnancy loss that happens after 20 weeks of pregnancy is called fetal demise if the baby's heart stops beating before birth. Fetal demise is much less common. Some women experience spontaneous labor shortly after fetal demise resulting in a stillborn birth (stillbirth).  Any pregnancy loss can be devastating. You will need to recover both physically and emotionally. Most women are able to get pregnant again after a pregnancy loss and deliver a healthy baby.  How to manage emotional recovery    Pregnancy loss is very hard emotionally. You may feel many different emotions while you grieve. You may feel sad and angry. You may also feel guilty. It is normal to have periods of crying. Emotional recovery can take longer than physical recovery. It is different for everyone.  Taking these steps can help you cope:  · Remember that it is unlikely you did anything to cause the pregnancy loss.  · Share your thoughts and feelings with friends, family, and your partner. Remember that your partner is also recovering emotionally.  · Make sure you have a good support system, and do not spend too much time alone.  · Meet with a pregnancy loss counselor or join a pregnancy loss support group.  · Get enough sleep and eat a healthy diet. Return to regular exercise when you have recovered physically.  · Do not use drugs or alcohol to manage your emotions.  · Consider seeing a mental health professional to help you recover emotionally.  · Ask a friend or loved one to help you decide what to do with any clothing and nursery items you received for your baby.  In the case of a stillbirth, many women benefit from taking additional steps in the grieving  process. You may want to:  · Hold your baby after the birth.  · Name your baby.  · Request a birth certificate.  · Create a keepsake such as handprints or footprints.  · Dress your baby and have a picture taken.  · Make funeral arrangements.  · Ask for a baptism or blessing.  Hospitals have staff members who can help you with all these arrangements.  How to recognize emotional stress  It is normal to have emotional stress after a pregnancy loss. But emotional stress that lasts a long time or becomes severe requires treatment. Watch out for these signs of severe emotional stress:  · Sadness, anger, or guilt that is not going away and is interfering with your normal activities.  · Relationship problems that have occurred or gotten worse since the pregnancy loss.  · Signs of depression that last longer than 2 weeks. These may include:  ? Sadness.  ? Anxiety.  ? Hopelessness.  ? Loss of interest in activities you enjoy.  ? Inability to concentrate.  ? Trouble sleeping or sleeping too much.  ? Loss of appetite or overeating.  ? Thoughts of death or of hurting yourself.  Follow these instructions at home:  Medicines  · Take over-the-counter and prescription medicines only as told by your health care provider.  Activity  · Rest at home until your energy level returns. Return to your normal activities as told by your health care provider. Ask your health care   provider what activities are safe for you.  General instructions  · Keep all follow-up visits as told by your health care provider. This is important.  · It may be helpful to meet with others who have experienced pregnancy loss. Ask your health care provider about support groups and resources.  · To help you and your partner with the process of grieving, talk with your health care provider or seek counseling.  · When you are ready, meet with your health care provider to discuss steps to take for a future pregnancy.  Where to find more information  · [place] Department of  Health and Human Services Office on Women's Health: www.womenshealth.gov  · American Pregnancy Association: www.americanpregnancy.org  Contact a health care provider if:  · You continue to experience grief, sadness, or lack of motivation for everyday activities, and those feelings do not improve over time.  · You are struggling to recover emotionally, especially if you are using alcohol or substances to help.  Get help right away if:  · You have thoughts of hurting yourself or others.  If you ever feel like you may hurt yourself or others, or have thoughts about taking your own life, get help right away. You can go to your nearest emergency department or call:  · Your local emergency services (911 in the [place]  · A suicide crisis helpline, such as the National Suicide Prevention Lifeline at 1-800-273-8255. This is open 24 hours a day.  Summary  · Any pregnancy loss can be difficult physically and emotionally.  · You may experience many different emotions while you grieve. Emotional recovery can last longer than physical recovery.  · It is normal to have emotional stress after a pregnancy loss. But emotional stress that lasts a long time or becomes severe requires treatment.  · See your health care provider if you are struggling emotionally after a pregnancy loss.  This information is not intended to replace advice given to you by your health care provider. Make sure you discuss any questions you have with your health care provider.  Document Released: 10/27/2017 Document Revised: 10/27/2017 Document Reviewed: 10/27/2017  [employer]vier Interactive Patient Education © 2019 [employer]

## 2018-10-19 NOTE — MAU Provider Note (Signed)
History     CSN: [id number]  Arrival date and time: 10/19/18 5397   First Provider Initiated Contact with Patient 10/19/18 1024      Chief Complaint  Patient presents with  . Vaginal Bleeding   Rebecca Day is a 33 y.o. [id number] at [redacted]w[redacted]d who presents for Vaginal Bleeding.  She states she has been bleeding on and off for the past few weeks, but it has increased in frequency and flow in the last 3 days.  She also states she has been passing clots and she has been having cramping.  She states she had an [place] in [place] that confirmed pregnancy and reported that she had a [place] around 7-8 weeks.  Patient reports she has had 2 first trimester losses and 4 second trimester losses (13-16 weeks).     OB History    Gravida  9   Para  2   Term  2   Preterm  0   AB  6   Living  2     SAB  6   TAB  0   Ectopic  0   Multiple  0   Live Births  2           Past Medical History:  Diagnosis Date  . Asthma   . Elevated blood sugar   . Kidney stones   . Lupus ([place])    questionable, no meds or treatment per pt  . 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[place])   . Ovarian cyst     Past Surgical History:  Procedure Laterality Date  . CESAREAN SECTION     C/S x 2  . PILONIDAL CYST DRAINAGE      No family history on file.  Social History   Tobacco Use  . Smoking status: Never Smoker  . Smokeless tobacco: Never Used  Substance Use Topics  . Alcohol use: Not Currently    Comment: socially  . Drug use: No    Allergies:  Allergies  Allergen Reactions  . Penicillins Hives    Has patient had a PCN reaction causing immediate rash, facial/tongue/throat swelling, SOB or lightheadedness with hypotension: Yes Has patient had a PCN reaction causing severe rash involving mucus membranes or skin necrosis: No Has patient had a PCN reaction that required hospitalization: Yes Has patient had a PCN reaction occurring within the last 10 years: NO If all of the above answers are "NO", then may proceed  with Cephalosporin use.    Medications Prior to Admission  Medication Sig Dispense Refill Last Dose  . acetaminophen (TYLENOL) 500 MG tablet Take 1,000 mg by mouth every 6 (six) hours as needed for moderate pain.   Past Month at Unknown time  . buPROPion (WELLBUTRIN XL) 150 MG 24 hr tablet Take 150 mg by mouth daily.  0 06/15/2018 at Unknown time  . hydroxychloroquine (PLAQUENIL) 200 MG tablet Take 1 tablet (200 mg total) by mouth 2 (two) times daily. 60 tablet 1 06/15/2018 at Unknown time  . ibuprofen (ADVIL,MOTRIN) 400 MG tablet Take 1 tablet (400 mg total) by mouth every 6 (six) hours as needed. 30 tablet 0   . naproxen (NAPROSYN) 375 MG tablet Take 1 tablet twice daily with food as needed for chest wall pain. (Patient not taking: Reported on 06/16/2018) 15 tablet 0 Not Taking at Unknown time  . ondansetron (ZOFRAN ODT) 8 MG disintegrating tablet Take 1 tablet (8 mg total) by mouth every 8 (eight) hours as needed for nausea or vomiting. (Patient not taking: Reported  on 06/16/2018) 10 tablet 0 Not Taking at Unknown time  . predniSONE (DELTASONE) 10 MG tablet Take 5 tablets (50 mg total) by mouth daily. 25 tablet 0     Review of Systems  Constitutional: Negative for chills and fever.  Gastrointestinal: Positive for abdominal pain, nausea and vomiting. Negative for constipation and diarrhea.  Genitourinary: Positive for vaginal bleeding. Negative for vaginal discharge.  Neurological: Positive for headaches. Negative for dizziness and light-headedness.   Physical Exam   Blood pressure (!) 144/82, pulse (!) 123, temperature 98.3 F (36.8 C), resp. rate 18, height 5\' 9"  (1.753 m), weight 130.2 kg, last menstrual period 07/29/2018, unknown if currently breastfeeding.   Vitals:   10/19/18 0938 10/19/18 1016 10/19/18 1228  BP: (!) 138/94 (!) 144/82 (!) 140/91  Pulse: (!) 114 (!) 123 (!) 104  Resp: 18 18 20   Temp: 98.3 F (36.8 C)  98.3 F (36.8 C)  TempSrc:   Oral  SpO2:   98%  Weight:  130.2 kg    Height: 5\' 9"  (1.753 m)       Physical Exam  Constitutional: She is oriented to person, place, and time. She appears well-developed and well-nourished. She appears distressed.  HENT:  Head: Normocephalic and atraumatic.  Eyes: Conjunctivae are normal.  Neck: Normal range of motion.  Cardiovascular: Normal rate, regular rhythm and normal heart sounds.  Respiratory: Effort normal and breath sounds normal.  GI: Soft. Bowel sounds are normal.  Genitourinary: Cervix exhibits no motion tenderness and no discharge.    Vaginal bleeding present.     No vaginal discharge.  There is bleeding in the vagina.    Genitourinary Comments: Speculum Exam: -Vaginal Vault: Pink mucosa.  Moderate amt blood noted -wet prep collected -Cervix:Pink, no lesions, cysts, or polyps.  Appears closed. No active bleeding  from os-GC/CT collected -Bimanual Exam: Deferred    Musculoskeletal: Normal range of motion.  Neurological: She is alert and oriented to person, place, and time.  Skin: Skin is warm and dry.  Psychiatric: She has a normal mood and affect. Her behavior is normal.   US Findings:  Intrauterine gestational sac: Not definitely visualized  Yolk sac:  N/A  Embryo:  N/A  Cardiac Activity: N/A  Heart Rate: N/A  bpm  MSD:   mm    w     d  CRL:    mm    w    d                  [place] EDC:  Subchorionic hemorrhage:  N/A  Maternal uterus/adnexae:  Endometrial complex 14 mm thick.  No focal uterine mass.  Few nabothian cysts at cervix, including a more central cyst at the upper cervix which is unchanged since 07/23/2017.  No gestational sac identified.  Neither ovary is visualized question obscured by bowel loops in adnexa.  No free pelvic fluid or adnexal masses.  IMPRESSION: No intrauterine gestation identified.  Findings are compatible with pregnancy of unknown location.  Differential diagnosis includes early intrauterine pregnancy too early to visualize,  spontaneous abortion, and ectopic pregnancy.  Serial quantitative beta hCG and or followup ultrasound recommended to definitively exclude ectopic pregnancy.  MAU Course  Procedures Results for orders placed or performed during the hospital encounter of 10/19/18 (from the past 24 hour(s))  Urinalysis, Routine w reflex microscopic     Status: Abnormal   Collection Time: 10/19/18  9:58 AM  Result Value Ref Range   Color, Urine YELLOW YELLOW  APPearance CLEAR CLEAR   Specific Gravity, Urine 1.020 1.005 - 1.030   pH 6.0 5.0 - 8.0   Glucose, UA NEGATIVE NEGATIVE mg/dL   Hgb urine dipstick LARGE (A) NEGATIVE   Bilirubin Urine NEGATIVE NEGATIVE   Ketones, ur NEGATIVE NEGATIVE mg/dL   Protein, ur NEGATIVE NEGATIVE mg/dL   Nitrite POSITIVE (A) NEGATIVE   Leukocytes,Ua TRACE (A) NEGATIVE  Urinalysis, Microscopic (reflex)     Status: Abnormal   Collection Time: 10/19/18  9:58 AM  Result Value Ref Range   RBC / HPF 21-50 0 - 5 RBC/hpf   WBC, UA 0-5 0 - 5 WBC/hpf   Bacteria, UA MANY (A) NONE SEEN   Squamous Epithelial / LPF 0-5 0 - 5  Pregnancy, urine POC     Status: Abnormal   Collection Time: 10/19/18 10:06 AM  Result Value Ref Range   Preg Test, Ur POSITIVE (A) NEGATIVE  Wet prep, genital     Status: Abnormal   Collection Time: 10/19/18 10:36 AM  Result Value Ref Range   Yeast Wet Prep HPF POC NONE SEEN NONE SEEN   Trich, Wet Prep NONE SEEN NONE SEEN   Clue Cells Wet Prep HPF POC NONE SEEN NONE SEEN   WBC, Wet Prep HPF POC FEW (A) NONE SEEN   Sperm NONE SEEN   CBC     Status: None   Collection Time: 10/19/18 10:42 AM  Result Value Ref Range   WBC 6.8 4.0 - 10.5 K/uL   RBC 4.24 3.87 - 5.11 MIL/uL   Hemoglobin 12.7 12.0 - 15.0 g/dL   HCT 38.7 36.0 - 46.0 %   MCV 91.3 80.0 - 100.0 fL   MCH 30.0 26.0 - 34.0 pg   MCHC 32.8 30.0 - 36.0 g/dL   RDW 13.0 11.5 - 15.5 %   Platelets 327 150 - 400 K/uL   nRBC 0.0 0.0 - 0.2 %  hCG, quantitative, pregnancy     Status: Abnormal    Collection Time: 10/19/18 10:42 AM  Result Value Ref Range   hCG, Beta Chain, Quant, S 54 (H) <5 mIU/mL  Type and screen     Status: None   Collection Time: 10/19/18 10:42 AM  Result Value Ref Range   ABO/RH(D) O POS    Antibody Screen NEG    Sample Expiration      10/22/2018 Performed at [facility], [address], [place], [place] [address]     MDM Pelvic Exam with cultures: Wet prep and GC/CT Labs: UA, CBC, hCG, and T&S TVUS Pain Medication  Assessment and Plan  33 year old  [id number] at 11.5weeks Vaginal Bleeding  -Exam findings discussed -WP pending -GC/CT collected and sent -Labs drawn -Tramadol 100mg  for pain -Will send for [place] and await results  Follow Up (12:21 PM) Missed Abortion Rh Positive  -[place] and lab results return as above. -Results discussed with patient. -Condolences and comfort given. -Informed of need for follow up hCG in ~2 weeks. -Scheduled for lab visit at [facility] on March 3rd at 0830. -Patient encouraged to start primary ob if desiring pregnancy. -No questions or concerns. -Bleeding precautions given -Encouraged to call or return to MAU if symptoms worsen or with the onset of new symptoms. -Discharged to home in stable condition  Maryann Conners MSN, CNM 10/19/2018, 10:24 AM

## 2018-10-19 NOTE — MAU Note (Signed)
Pt reports she started having heavy vaginal bleeding this morning with cramping. Stated she has a subchorionic hemorrhage but the bleeding is heavier today then it has been. (had U/S in [place])

## 2018-10-20 LAB — GC/CHLAMYDIA PROBE AMP (~~LOC~~) NOT AT ARMC
Chlamydia: NEGATIVE
Neisseria Gonorrhea: NEGATIVE

## 2018-11-02 ENCOUNTER — Other Ambulatory Visit: Payer: Self-pay

## 2018-11-02 ENCOUNTER — Encounter: Payer: Self-pay | Admitting: Nurse Practitioner

## 2018-11-02 ENCOUNTER — Ambulatory Visit (INDEPENDENT_AMBULATORY_CARE_PROVIDER_SITE_OTHER): Payer: Medicaid Other | Admitting: *Deleted

## 2018-11-02 ENCOUNTER — Ambulatory Visit: Payer: Medicaid Other | Admitting: Nurse Practitioner

## 2018-11-02 VITALS — BP 132/80 | HR 85 | Temp 98.0°F | Ht 69.0 in | Wt 289.4 lb

## 2018-11-02 DIAGNOSIS — Z87448 Personal history of other diseases of urinary system: Secondary | ICD-10-CM | POA: Diagnosis not present

## 2018-11-02 DIAGNOSIS — R109 Unspecified abdominal pain: Secondary | ICD-10-CM | POA: Diagnosis not present

## 2018-11-02 DIAGNOSIS — M329 Systemic lupus erythematosus, unspecified: Secondary | ICD-10-CM | POA: Diagnosis not present

## 2018-11-02 DIAGNOSIS — O034 Incomplete spontaneous abortion without complication: Secondary | ICD-10-CM

## 2018-11-02 DIAGNOSIS — O039 Complete or unspecified spontaneous abortion without complication: Secondary | ICD-10-CM

## 2018-11-02 DIAGNOSIS — F32A Depression, unspecified: Secondary | ICD-10-CM | POA: Insufficient documentation

## 2018-11-02 DIAGNOSIS — F329 Major depressive disorder, single episode, unspecified: Secondary | ICD-10-CM

## 2018-11-02 LAB — POCT URINALYSIS DIPSTICK
Bilirubin, UA: NEGATIVE
Color, UA: NEGATIVE
Glucose, UA: NEGATIVE
Ketones, UA: NEGATIVE
Leukocytes, UA: NEGATIVE
Nitrite, UA: NEGATIVE
Protein, UA: NEGATIVE
Spec Grav, UA: 1.025 (ref 1.010–1.025)
Urobilinogen, UA: 0.2 E.U./dL
pH, UA: 6.5 (ref 5.0–8.0)

## 2018-11-02 MED ORDER — HYDROXYCHLOROQUINE SULFATE 200 MG PO TABS: 200.0000 mg | ORAL_TABLET | Freq: Two times a day (BID) | ORAL | 2 refills | Status: DC

## 2018-11-02 NOTE — Progress Notes (Signed)
Agree with A & P. 

## 2018-11-02 NOTE — Progress Notes (Signed)
Here for stat bhcg. C/o cramping  Pain not as bad as she was having but =6. C/o very light bleeding. Reviewed chart with Dr. Rip Harbour and does not need to be stat. Explained to patient light bleeding and cramping normal for now and that we will draw non stat bhcg today and call her results in a few days. Also instructed her to schedule followup with provider in 2 weeks. Support given. She voices understanding.  Draysen Weygandt,RN

## 2018-11-02 NOTE — Progress Notes (Signed)
Subjective:     Patient ID: Rebecca Day , female    DOB: 1985-12-23 , 33 y.o.   MRN: [id number]   Chief Complaint  Patient presents with  . New Patient (Initial Visit)     left flank pain goin on for months     HPI  Here to establish care - she is caring for her daughter who has health problems.  She has not seen a provider here locally in the last 1.5 years.  She went [facility].  Working with [employer] support.  Married.  2 children.  She has several aunts with Lupus - had been having fevers, blood clot and rash on her face - speckle patterns on her ANA. Biopsied rash and was Lupus.  Previous history of Postpartum depression (wellbutrin) - 4 - 5 months.    She has seen a Rheumatologist in the past for her Lupus - 1-2 years ago.  Plaquenil has been without less than one month.  She went to [facility] [address].    Father with Cancer Lung she believes with metastasis. Colon cancer - maternal aunts and cousin, she had a cousin 36 y/o with colon cancer passed away. Dgt - hypertension, epilepsy, autoimmune issues (planning to see genetics next week).  Brother history of cirrhosis and dialysis had kidney transplant double liver and one kidney.    No alcohol or smoking.   Recent spontaneous was 13 weeks, she had passed a lot of blood.  She has had 4th second trimester loss.  She would like to go to [place].  She has a follow up on March 19th at [facility] pain -   Flank Pain  This is a recurrent problem. The current episode started more than 1 year ago. The quality of the pain is described as aching. The pain does not radiate (left groin). The pain is at a severity of 10/10 (at its worse, now not much discomfort). The pain is the same all the time. Stiffness is present in the morning. Pertinent negatives include no abdominal pain, chest pain, dysuria, headaches or weakness. Risk factors include obesity. Treatments tried: never seen a urologist.      Past Medical History:  Diagnosis Date  . Asthma   . Elevated blood sugar   . Kidney stones   . Lupus ([place])    questionable, no meds or treatment per pt  . 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[place])   . Ovarian cyst      Family History  Problem Relation Age of Onset  . Hypertension Mother   . Cancer Father   . Kidney disease Brother   . Liver disease Brother   . Hyperlipidemia Maternal Grandmother      Current Outpatient Medications:  .  acetaminophen (TYLENOL) 500 MG tablet, Take 1,000 mg by mouth every 6 (six) hours as needed for moderate pain., Disp: , Rfl:  .  hydroxychloroquine (PLAQUENIL) 200 MG tablet, Take 1 tablet (200 mg total) by mouth 2 (two) times daily., Disp: 60 tablet, Rfl: 1 .  ibuprofen (ADVIL,MOTRIN) 400 MG tablet, Take 1 tablet (400 mg total) by mouth every 6 (six) hours as needed., Disp: 30 tablet, Rfl: 0   Allergies  Allergen Reactions  . Penicillins Hives    Has patient had a PCN reaction causing immediate rash, facial/tongue/throat swelling, SOB or lightheadedness with hypotension: Yes Has patient had a PCN reaction causing severe rash involving mucus membranes or skin necrosis: No Has patient had a PCN reaction  that required hospitalization: Yes Has patient had a PCN reaction occurring within the last 10 years: NO If all of the above answers are "NO", then may proceed with Cephalosporin use.     Review of Systems  Constitutional: Negative.  Negative for fatigue.  Eyes: Negative for visual disturbance.  Respiratory: Negative.  Negative for shortness of breath.   Cardiovascular: Negative.  Negative for chest pain, palpitations and leg swelling.  Gastrointestinal: Negative.  Negative for abdominal pain.  Endocrine: Negative.   Genitourinary: Positive for flank pain. Negative for dysuria.  Skin: Negative.   Neurological: Negative for dizziness, weakness and headaches.  Psychiatric/Behavioral: Negative for confusion. The patient is not nervous/anxious.      Today's  Vitals   11/02/18 1409  BP: 132/80  Pulse: 85  Temp: 98 F (36.7 C)  TempSrc: Oral  SpO2: 98%  Weight: 289 lb 6.4 oz (131.3 kg)  Height: 5\' 9"  (1.753 m)   Body mass index is 42.74 kg/m.   Objective:  Physical Exam Vitals signs reviewed.  Constitutional:      Appearance: Normal appearance. She is well-developed.  HENT:     Head: Normocephalic and atraumatic.  Eyes:     Pupils: Pupils are equal, round, and reactive to light.  Cardiovascular:     Rate and Rhythm: Normal rate and regular rhythm.     Pulses: Normal pulses.     Heart sounds: Normal heart sounds. No murmur.  Pulmonary:     Effort: Pulmonary effort is normal.     Breath sounds: Normal breath sounds.  Musculoskeletal: Normal range of motion.  Skin:    General: Skin is warm and dry.     Capillary Refill: Capillary refill takes less than 2 seconds.  Neurological:     General: No focal deficit present.     Mental Status: She is alert and oriented to person, place, and time.     Cranial Nerves: No cranial nerve deficit.  Psychiatric:        Mood and Affect: Mood normal.         Assessment And Plan:     1. Lupus ([place])  Reports history of lupus will obtain labs and send referral to rheumatology  Will refer her plaquenil as her previous order so she does not run out - Ambulatory referral to Rheumatology - CMP14 + Anion Gap - hydroxychloroquine (PLAQUENIL) 200 MG tablet; Take 1 tablet (200 mg total) by mouth 2 (two) times daily.  Dispense: 60 tablet; Refill: 2  2. Flank pain  Urine sample is normal except for trace blood  Will check for renal ultrasound  No abnormal findings on physical exam - POCT Urinalysis Dipstick (81002) - US Renal; Future - CMP14 + Anion Gap  3. History of hematuria  Has had a history of hematuria - US Renal; Future  4. Depression, unspecified depression type  Previous history and will restart on wellbutrin        [name], FNP

## 2018-11-03 ENCOUNTER — Other Ambulatory Visit: Payer: Self-pay | Admitting: Nurse Practitioner

## 2018-11-03 ENCOUNTER — Other Ambulatory Visit: Payer: Self-pay

## 2018-11-03 LAB — CMP14 + ANION GAP
ALT: 15 IU/L (ref 0–32)
AST: 18 IU/L (ref 0–40)
Albumin/Globulin Ratio: 1.6 (ref 1.2–2.2)
Albumin: 4.5 g/dL (ref 3.9–5.0)
Alkaline Phosphatase: 85 IU/L (ref 39–117)
Anion Gap: 22 mmol/L — ABNORMAL HIGH (ref 10.0–18.0)
BUN/Creatinine Ratio: 10 (ref 9–23)
BUN: 7 mg/dL (ref 6–20)
Bilirubin Total: <0.2 mg/dL (ref 0.0–1.2)
CO2: 19 mmol/L — ABNORMAL LOW (ref 20–29)
Calcium: 9.6 mg/dL (ref 8.7–10.2)
Chloride: 104 mmol/L (ref 96–106)
Creatinine, Ser: 0.72 mg/dL (ref 0.57–1.00)
GFR calc Af Amer: 130 mL/min/1.73
GFR calc non Af Amer: 113 mL/min/1.73
Globulin, Total: 2.9 g/dL (ref 1.5–4.5)
Glucose: 84 mg/dL (ref 65–99)
Potassium: 4 mmol/L (ref 3.5–5.2)
Sodium: 145 mmol/L — ABNORMAL HIGH (ref 134–144)
Total Protein: 7.4 g/dL (ref 6.0–8.5)

## 2018-11-03 LAB — BETA HCG QUANT (REF LAB): hCG Quant: 9 m[IU]/mL

## 2018-11-03 MED ORDER — BUPROPION HCL ER (XL) 150 MG PO TB24: 150.0000 mg | ORAL_TABLET | ORAL | 2 refills | Status: DC

## 2018-11-06 ENCOUNTER — Telehealth: Payer: Self-pay | Admitting: *Deleted

## 2018-11-06 NOTE — Telephone Encounter (Signed)
Called pt and informed her that the pregnancy hormone level from last week is almost back to normal. She will have another level drawn @ her next visit on 3/19. Pt voiced understanding.

## 2018-11-06 NOTE — Telephone Encounter (Signed)
-----   Message from Chancy Milroy, MD sent at 11/06/2018  9:11 AM EDT ----- [facility] with appt in 2 weeks Thanks Legrand Como

## 2018-11-12 ENCOUNTER — Emergency Department (HOSPITAL_BASED_OUTPATIENT_CLINIC_OR_DEPARTMENT_OTHER): Payer: Medicaid Other

## 2018-11-12 ENCOUNTER — Other Ambulatory Visit: Payer: Self-pay

## 2018-11-12 ENCOUNTER — Encounter (HOSPITAL_BASED_OUTPATIENT_CLINIC_OR_DEPARTMENT_OTHER): Payer: Self-pay | Admitting: Emergency Medicine

## 2018-11-12 ENCOUNTER — Emergency Department (HOSPITAL_BASED_OUTPATIENT_CLINIC_OR_DEPARTMENT_OTHER)
Admission: EM | Admit: 2018-11-12 | Discharge: 2018-11-12 | Disposition: A | Discharge status: Home / Self Care | Payer: Medicaid Other | Attending: Emergency Medicine | Admitting: Emergency Medicine

## 2018-11-12 DIAGNOSIS — N760 Acute vaginitis: Secondary | ICD-10-CM | POA: Insufficient documentation

## 2018-11-12 DIAGNOSIS — R109 Unspecified abdominal pain: Secondary | ICD-10-CM | POA: Insufficient documentation

## 2018-11-12 DIAGNOSIS — B9689 Other specified bacterial agents as the cause of diseases classified elsewhere: Secondary | ICD-10-CM | POA: Insufficient documentation

## 2018-11-12 DIAGNOSIS — Z79899 Other long term (current) drug therapy: Secondary | ICD-10-CM | POA: Insufficient documentation

## 2018-11-12 DIAGNOSIS — J45909 Unspecified asthma, uncomplicated: Secondary | ICD-10-CM | POA: Diagnosis not present

## 2018-11-12 LAB — COMPREHENSIVE METABOLIC PANEL
ALK PHOS: 74 U/L (ref 38–126)
ALT: 16 U/L (ref 0–44)
AST: 17 U/L (ref 15–41)
Albumin: 4 g/dL (ref 3.5–5.0)
Anion gap: 6 (ref 5–15)
BILIRUBIN TOTAL: 0.4 mg/dL (ref 0.3–1.2)
BUN: 7 mg/dL (ref 6–20)
CO2: 22 mmol/L (ref 22–32)
CREATININE: 0.61 mg/dL (ref 0.44–1.00)
Calcium: 9.3 mg/dL (ref 8.9–10.3)
Chloride: 106 mmol/L (ref 98–111)
GFR calc Af Amer: >60 mL/min (ref >60)
GFR calc non Af Amer: >60 mL/min (ref >60)
Glucose, Bld: 86 mg/dL (ref 70–99)
Potassium: 3.9 mmol/L (ref 3.5–5.1)
Sodium: 134 mmol/L — ABNORMAL LOW (ref 135–145)
Total Protein: 7.6 g/dL (ref 6.5–8.1)

## 2018-11-12 LAB — CBC WITH DIFFERENTIAL/PLATELET
ABS IMMATURE GRANULOCYTES: 0.01 10*3/uL (ref 0.00–0.07)
Basophils Absolute: 0 10*3/uL (ref 0.0–0.1)
Basophils Relative: 0 %
Eosinophils Absolute: 0.1 10*3/uL (ref 0.0–0.5)
Eosinophils Relative: 1 %
HCT: 38.7 % (ref 36.0–46.0)
HEMOGLOBIN: 12.4 g/dL (ref 12.0–15.0)
Immature Granulocytes: 0 %
Lymphocytes Relative: 36 %
Lymphs Abs: 2.5 10*3/uL (ref 0.7–4.0)
MCH: 29.3 pg (ref 26.0–34.0)
MCHC: 32 g/dL (ref 30.0–36.0)
MCV: 91.5 fL (ref 80.0–100.0)
Monocytes Absolute: 0.5 10*3/uL (ref 0.1–1.0)
Monocytes Relative: 7 %
Neutro Abs: 3.9 10*3/uL (ref 1.7–7.7)
Neutrophils Relative %: 56 %
Platelets: 288 10*3/uL (ref 150–400)
RBC: 4.23 MIL/uL (ref 3.87–5.11)
RDW: 12.7 % (ref 11.5–15.5)
WBC: 6.9 10*3/uL (ref 4.0–10.5)
nRBC: 0 % (ref 0.0–0.2)

## 2018-11-12 LAB — URINALYSIS, ROUTINE W REFLEX MICROSCOPIC
Bilirubin Urine: NEGATIVE
Glucose, UA: NEGATIVE mg/dL
Ketones, ur: NEGATIVE mg/dL
LEUKOCYTE UA: NEGATIVE
Nitrite: NEGATIVE
Protein, ur: NEGATIVE mg/dL
Specific Gravity, Urine: >1.03 — ABNORMAL HIGH (ref 1.005–1.030)
pH: 6 (ref 5.0–8.0)

## 2018-11-12 LAB — WET PREP, GENITAL
Sperm: NONE SEEN
Trich, Wet Prep: NONE SEEN
Yeast Wet Prep HPF POC: NONE SEEN

## 2018-11-12 LAB — URINALYSIS, MICROSCOPIC (REFLEX)

## 2018-11-12 LAB — PREGNANCY, URINE: Preg Test, Ur: NEGATIVE

## 2018-11-12 MED ORDER — ONDANSETRON 4 MG PO TBDP
4.0000 mg | ORAL_TABLET | Freq: Once | ORAL | Status: AC
  Administered 2018-11-12: 4 mg via ORAL
  Filled 2018-11-12: qty 1

## 2018-11-12 MED ORDER — MORPHINE SULFATE (PF) 4 MG/ML IV SOLN
4.0000 mg | Freq: Once | INTRAVENOUS | Status: AC
  Administered 2018-11-12: 4 mg via INTRAVENOUS
  Filled 2018-11-12: qty 1

## 2018-11-12 MED ORDER — IOHEXOL 300 MG/ML  SOLN
100.0000 mL | Freq: Once | INTRAMUSCULAR | Status: AC | PRN
  Administered 2018-11-12: 100 mL via INTRAVENOUS

## 2018-11-12 MED ORDER — MORPHINE SULFATE (PF) 4 MG/ML IV SOLN
4.0000 mg | Freq: Once | INTRAVENOUS | Status: AC
  Administered 2018-11-12: 4 mg via INTRAMUSCULAR

## 2018-11-12 MED ORDER — MORPHINE SULFATE (PF) 4 MG/ML IV SOLN
4.0000 mg | Freq: Once | INTRAVENOUS | Status: DC
  Filled 2018-11-12: qty 1

## 2018-11-12 MED ORDER — SODIUM CHLORIDE 0.9 % IV BOLUS
1000.0000 mL | Freq: Once | INTRAVENOUS | Status: AC
  Administered 2018-11-12: 1000 mL via INTRAVENOUS

## 2018-11-12 MED ORDER — NAPROXEN 500 MG PO TABS: 500.0000 mg | ORAL_TABLET | Freq: Two times a day (BID) | ORAL | 0 refills | Status: DC

## 2018-11-12 MED ORDER — ONDANSETRON HCL 4 MG/2ML IJ SOLN
4.0000 mg | Freq: Once | INTRAMUSCULAR | Status: DC
  Filled 2018-11-12: qty 2

## 2018-11-12 MED ORDER — METRONIDAZOLE 500 MG PO TABS: 500.0000 mg | ORAL_TABLET | Freq: Two times a day (BID) | ORAL | 0 refills | Status: DC

## 2018-11-12 MED ORDER — SODIUM CHLORIDE 0.9 % IV BOLUS: 1000.0000 mL | Freq: Once | INTRAVENOUS | Status: DC

## 2018-11-12 NOTE — Discharge Instructions (Addendum)
You are seen in the emergency department today for abdominal pain.  Your work-up was overall reassuring.  Your labs are fairly normal with the exception that you had bacterial vaginosis on your wet prep, see the attached handout for further information.  We are treating this with Flagyl, an antibiotic, take this as prescribed.  Do not consume alcohol with this antibiotic as it can have severe side effects.  Your CT scan was fairly normal and did not show a cause of your discomfort.  It did show that you have a 1.4 cm mass to the liver, we are unsure of exactly what this is, it could be a cyst, or adenoma.  Please discuss this with your primary care provider.  Your ultrasound was somewhat inconclusive due to difficulty visualizing certain structures.   We are unsure of the exact cause of your symptoms.  We are trying to treat this with naproxen. - Naproxen is a nonsteroidal anti-inflammatory medication that will help with pain and swelling. Be sure to take this medication as prescribed with food, 1 pill every 12 hours,  It should be taken with food, as it can cause stomach upset, and more seriously, stomach bleeding. Do not take other nonsteroidal anti-inflammatory medications with this such as Advil, Motrin, Aleve, Mobic, Goodie Powder, or Motrin.    You make take Tylenol per over the counter dosing with these medications.   We have prescribed you new medication(s) today. Discuss the medications prescribed today with your pharmacist as they can have adverse effects and interactions with your other medicines including over the counter and prescribed medications. Seek medical evaluation if you start to experience new or abnormal symptoms after taking one of these medicines, seek care immediately if you start to experience difficulty breathing, feeling of your throat closing, facial swelling, or rash as these could be indications of a more serious allergic reaction.  We would like you to follow-up with  primary care as well as OB/GYN for reevaluation and further assessment.  Return to the ER for new or worsening symptoms or any other concerns that you may have.

## 2018-11-12 NOTE — ED Triage Notes (Signed)
Pt c/o lower abdominal pain left side worse than right onset Friday. Pt denies N/V/D, reports had a 2nd trimester miscarriage about 3 weeks ago.

## 2018-11-12 NOTE — ED Notes (Signed)
Patient transported to CT 

## 2018-11-12 NOTE — ED Provider Notes (Signed)
[place] EMERGENCY DEPARTMENT Provider Note   CSN: [id number] Arrival date & time: 11/12/18  6168   History   Chief Complaint Chief Complaint  Patient presents with   Abdominal Pain    HPI Rebecca Day is a 33 y.o. female with a hx of asthma, nephrolithiasis, lupus w/ lupus nephritis, ovarian cysts, and depression who presents to the ED with complaints of abdominal pain which has been occurring mildly for a few weeks, but has increased over the past 2-3 days. Patient notes that she had a 2nd trimester (14 weeks) miscarriage approximately 3 weeks ago and has been having mild lower abdominal/pelvic cramping since. She notes that her quantitative hCGs had been trending down appropriately. She has remained with mild pink tinge to toilet paper when wiping but no overt vaginal bleeding. She states that over the past 2-3 days the pain has increased in severity in the lower abdomen/pelvis. She notes it is constant, aching/sharp, bilateral (L>R), and an 8/10 in severity. It is worse with sitting upright/movement, no alleviating factors. Has had some nausea. Denies fever, chills, emesis, diarrhea, dysuria, or vaginal discharge. Patient has had 2 prior c-sections, 4 total miscarriages. Has been sexually active via vaginal intercourse once since miscarriage, used protection, monogamous with her husband, no concern for STD.      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[facility])    Ovarian cyst     Patient Active Problem List   Diagnosis Date Noted   Lupus ([place]) 11/02/2018   Flank pain 11/02/2018   Depression 11/02/2018   History of hematuria 11/02/2018   Family history of spina bifida 07/06/2011    Past Surgical History:  Procedure Laterality Date   CESAREAN SECTION     C/S x 2   PILONIDAL CYST DRAINAGE       OB History    Gravida  9   Para  2   Term  2   Preterm  0   AB  6   Living  2     SAB  6   TAB  0   Ectopic  0   Multiple  0   Live Births  2            Home Medications    Prior to Admission medications   Medication Sig Start Date End Date Taking? Authorizing Provider  acetaminophen (TYLENOL) 500 MG tablet Take 1,000 mg by mouth every 6 (six) hours as needed for moderate pain.    [provider]  buPROPion (WELLBUTRIN XL) 150 MG 24 hr tablet Take 1 tablet (150 mg total) by mouth every morning. 11/03/18 11/03/19  [name], FNP  hydroxychloroquine (PLAQUENIL) 200 MG tablet Take 1 tablet (200 mg total) by mouth 2 (two) times daily. 11/02/18 11/02/19  [name], FNP  ibuprofen (ADVIL,MOTRIN) 400 MG tablet Take 1 tablet (400 mg total) by mouth every 6 (six) hours as needed. 06/16/18   Varney Biles, MD    Family History Family History  Problem Relation Age of Onset   Hypertension Mother    Cancer Father    Kidney disease Brother    Liver disease Brother    Hyperlipidemia Maternal Grandmother     Social History Social History   Tobacco Use   Smoking status: Never Smoker   Smokeless  tobacco: Never Used  Substance Use Topics   Alcohol use: Not Currently    Comment: socially   Drug use: No     Allergies   Penicillins   Review of Systems Review of Systems  Constitutional: Negative for chills and fever.  Respiratory: Negative for shortness of breath.   Cardiovascular: Negative for chest pain.  Gastrointestinal: Positive for abdominal pain and nausea. Negative for blood in stool, constipation, diarrhea and vomiting.  Genitourinary: Positive for pelvic pain and vaginal bleeding (mild pink, no significant bleeding). Negative for dysuria and vaginal discharge.  All other systems reviewed and are negative.    Physical Exam Updated Vital Signs LMP 07/29/2018    Breastfeeding No   Physical Exam Vitals signs and nursing note reviewed. Exam  conducted with a chaperone present.  Constitutional:      General: She is not in acute distress.    Appearance: She is well-developed. She is not toxic-appearing.  HENT:     Head: Normocephalic and atraumatic.  Eyes:     General:        Right eye: No discharge.        Left eye: No discharge.     Conjunctiva/sclera: Conjunctivae normal.  Neck:     Musculoskeletal: Neck supple.  Cardiovascular:     Rate and Rhythm: Normal rate and regular rhythm.  Pulmonary:     Effort: Pulmonary effort is normal. No respiratory distress.     Breath sounds: Normal breath sounds. No wheezing, rhonchi or rales.  Abdominal:     General: There is no distension.     Palpations: Abdomen is soft.     Tenderness: There is abdominal tenderness in the suprapubic area and left lower quadrant. There is no right CVA tenderness, left CVA tenderness, guarding or rebound. Negative signs include McBurney's sign.  Genitourinary:    Exam position: Supine.     Vagina: Vaginal discharge (minimal white) present. No bleeding.     Cervix: No cervical bleeding.     Comments: Genital piercing in place without surrounding erythema or purulent drainage. Diffusely uncomfortable throughout pelvic exam. More so w/ L adnexal palpation. No masses palpated. No cervical friability noted.  Skin:    General: Skin is warm and dry.     Findings: No rash.  Neurological:     Mental Status: She is alert.     Comments: Clear speech.   Psychiatric:        Behavior: Behavior normal.      ED Treatments / Results  Labs (all labs ordered are listed, but only abnormal results are displayed) Labs Reviewed  WET PREP, GENITAL - Abnormal; Notable for the following components:      Result Value   Clue Cells Wet Prep HPF POC PRESENT (*)    WBC, Wet Prep HPF POC MODERATE (*)    All other components within normal limits  URINALYSIS, ROUTINE W REFLEX MICROSCOPIC - Abnormal; Notable for the following components:   Specific Gravity, Urine >1.030  (*)    Hgb urine dipstick TRACE (*)    All other components within normal limits  COMPREHENSIVE METABOLIC PANEL - Abnormal; Notable for the following components:   Sodium 134 (*)    All other components within normal limits  URINALYSIS, MICROSCOPIC (REFLEX) - Abnormal; Notable for the following components:   Bacteria, UA FEW (*)    All other components within normal limits  PREGNANCY, URINE  CBC WITH DIFFERENTIAL/PLATELET  GC/CHLAMYDIA PROBE AMP ([place]) NOT AT [facility]  EKG None  Radiology US Transvaginal Non-ob  Result Date: 11/12/2018 CLINICAL DATA:  Patient with left lower quadrant abdominal pain for 3 days. EXAM: TRANSABDOMINAL AND TRANSVAGINAL ULTRASOUND OF PELVIS DOPPLER ULTRASOUND OF OVARIES TECHNIQUE: Both transabdominal and transvaginal ultrasound examinations of the pelvis were performed. Transabdominal technique was performed for global imaging of the pelvis including uterus, ovaries, adnexal regions, and pelvic cul-de-sac. It was necessary to proceed with endovaginal exam following the transabdominal exam to visualize the adnexal structures. Color and duplex Doppler ultrasound was utilized to evaluate blood flow to the ovaries. COMPARISON:  Pelvic ultrasound 10/19/2018 FINDINGS: Uterus Measurements: 10.9 x 5.6 x 5.6 cm = volume: 176 mL. No fibroids or other mass visualized. Endometrium Thickness: 15 mm.  No focal abnormality visualized. Right ovary Not visualized. Left ovary Limited visualization and assessment of the left ovary given posterior location. Measurements: 2.8 x 2.5 x 2.8 cm = volume: 10 mL. Normal appearance/no adnexal mass. Pulsed Doppler evaluation of the left ovary demonstrates no arterial or venous waveforms identified. It is unclear if this is secondary to the location of the ovary, both deep within the pelvis and posterior to the uterus or true non vascular flow. Other findings Trace fluid IMPRESSION: The left ovary is located deep within the pelvis, posterior  to the uterus, limiting evaluation. No arterial or venous waveforms are demonstrated within the left ovary which may be secondary to technique from the deep/posterior location of the ovary or potentially ovarian torsion. These results were called by telephone at the time of interpretation on 11/12/2018 at 12:13 pm to [facility] , who verbally acknowledged these results. Electronically Signed   By: Lovey Newcomer M.D.   On: 11/12/2018 12:15   US Pelvis Complete  Result Date: 11/12/2018 CLINICAL DATA:  Patient with left lower quadrant abdominal pain for 3 days. EXAM: TRANSABDOMINAL AND TRANSVAGINAL ULTRASOUND OF PELVIS DOPPLER ULTRASOUND OF OVARIES TECHNIQUE: Both transabdominal and transvaginal ultrasound examinations of the pelvis were performed. Transabdominal technique was performed for global imaging of the pelvis including uterus, ovaries, adnexal regions, and pelvic cul-de-sac. It was necessary to proceed with endovaginal exam following the transabdominal exam to visualize the adnexal structures. Color and duplex Doppler ultrasound was utilized to evaluate blood flow to the ovaries. COMPARISON:  Pelvic ultrasound 10/19/2018 FINDINGS: Uterus Measurements: 10.9 x 5.6 x 5.6 cm = volume: 176 mL. No fibroids or other mass visualized. Endometrium Thickness: 15 mm.  No focal abnormality visualized. Right ovary Not visualized. Left ovary Limited visualization and assessment of the left ovary given posterior location. Measurements: 2.8 x 2.5 x 2.8 cm = volume: 10 mL. Normal appearance/no adnexal mass. Pulsed Doppler evaluation of the left ovary demonstrates no arterial or venous waveforms identified. It is unclear if this is secondary to the location of the ovary, both deep within the pelvis and posterior to the uterus or true non vascular flow. Other findings Trace fluid IMPRESSION: The left ovary is located deep within the pelvis, posterior to the uterus, limiting evaluation. No arterial or venous waveforms  are demonstrated within the left ovary which may be secondary to technique from the deep/posterior location of the ovary or potentially ovarian torsion. These results were called by telephone at the time of interpretation on 11/12/2018 at 12:13 pm to [facility] , who verbally acknowledged these results. Electronically Signed   By: Lovey Newcomer M.D.   On: 11/12/2018 12:15   Ct Abdomen Pelvis W Contrast  Result Date: 11/12/2018 CLINICAL DATA:  Lower abdominal pain, worse on the  left, for the past 2 days. Nausea. Fever 2 days ago. EXAM: CT ABDOMEN AND PELVIS WITH CONTRAST TECHNIQUE: Multidetector CT imaging of the abdomen and pelvis was performed using the standard protocol following bolus administration of intravenous contrast. CONTRAST:  174mL OMNIPAQUE IOHEXOL 300 MG/ML  SOLN COMPARISON:  Pelvic ultrasound dated 10/19/2018 and abdomen and pelvis CT dated 07/29/2017. Abdomen and pelvis CT with contrast dated 06/25/2014 and 05/18/2014. FINDINGS: Lower chest: Minimal bilateral dependent atelectasis. Hepatobiliary: 1.4 cm rounded area of low density with indistinct margins and mild heterogeneity in the liver on the right on image number 19 series 2, not seen on the previous examinations. Normal appearing gallbladder. Pancreas: Unremarkable. No pancreatic ductal dilatation or surrounding inflammatory changes. Spleen: Normal in size without focal abnormality. Adrenals/Urinary Tract: Adrenal glands are unremarkable. Kidneys are normal, without renal calculi, focal lesion, or hydronephrosis. Bladder is unremarkable. Stomach/Bowel: Stomach is within normal limits. Appendix appears normal. No evidence of bowel wall thickening, distention, or inflammatory changes. Vascular/Lymphatic: No significant vascular findings are present. No enlarged abdominal or pelvic lymph nodes. Reproductive: Uterus and bilateral adnexa are unremarkable. Other: No abdominal wall hernia or abnormality. No abdominopelvic ascites.  Musculoskeletal: Minimal lumbar and lower thoracic spine degenerative changes. IMPRESSION: 1. No acute abnormality. 2. Interval visualization of a 1.4 cm mass in the right lobe of the liver. This could represent a cyst, hemangioma or adenoma. Electronically Signed   By: Claudie Revering M.D.   On: 11/12/2018 15:54   [place] Art/ven Flow Abd Pelv Doppler  Result Date: 11/12/2018 CLINICAL DATA:  Patient with left lower quadrant abdominal pain for 3 days. EXAM: TRANSABDOMINAL AND TRANSVAGINAL ULTRASOUND OF PELVIS DOPPLER ULTRASOUND OF OVARIES TECHNIQUE: Both transabdominal and transvaginal ultrasound examinations of the pelvis were performed. Transabdominal technique was performed for global imaging of the pelvis including uterus, ovaries, adnexal regions, and pelvic cul-de-sac. It was necessary to proceed with endovaginal exam following the transabdominal exam to visualize the adnexal structures. Color and duplex Doppler ultrasound was utilized to evaluate blood flow to the ovaries. COMPARISON:  Pelvic ultrasound 10/19/2018 FINDINGS: Uterus Measurements: 10.9 x 5.6 x 5.6 cm = volume: 176 mL. No fibroids or other mass visualized. Endometrium Thickness: 15 mm.  No focal abnormality visualized. Right ovary Not visualized. Left ovary Limited visualization and assessment of the left ovary given posterior location. Measurements: 2.8 x 2.5 x 2.8 cm = volume: 10 mL. Normal appearance/no adnexal mass. Pulsed Doppler evaluation of the left ovary demonstrates no arterial or venous waveforms identified. It is unclear if this is secondary to the location of the ovary, both deep within the pelvis and posterior to the uterus or true non vascular flow. Other findings Trace fluid IMPRESSION: The left ovary is located deep within the pelvis, posterior to the uterus, limiting evaluation. No arterial or venous waveforms are demonstrated within the left ovary which may be secondary to technique from the deep/posterior location of the ovary or  potentially ovarian torsion. These results were called by telephone at the time of interpretation on 11/12/2018 at 12:13 pm to [facility] , who verbally acknowledged these results. Electronically Signed   By: Lovey Newcomer M.D.   On: 11/12/2018 12:15    Procedures Procedures (including critical care time)  Medications Ordered in ED Medications - No data to display   Initial Impression / Assessment and Plan / ED Course  I have reviewed the triage vital signs and the nursing notes.  Pertinent labs & imaging results that were available during my care of the  patient were reviewed by me and considered in my medical decision making (see chart for details).    Patient presents to the ED with complaints of abdominal/pelvic pain. Patient nontoxic appearing, in no apparent distress, vitals without significant abnormality. On exam patient tender to LLQ, supapubic area, and mild diffuse tenderness on pelvic exam, no peritoneal signs. Will evaluate with labs and [place] for further assessment. Analgesics & anti-emetics ordered.   UA: Elevated specific gravity, receiving fluids, not consistent with UTI. Preg test: Negative  12:13: CONSULT: Discussed with radiologist Dr. Modena Morrow he left ovary is located deep within the pelvis, posterior to the uterus, limiting evaluation. No arterial or venous waveforms are demonstrated within the left ovary which may be secondary to technique from the deep/posterior location of the ovary or potentially ovarian torsion. Recommends obgyn consultation.  12:24: CONSULT: Discussed with gynecologist Dr. Elonda Husky- given H&P and ultrasound instructs this unlikely torsion, no further need for intervention.   Difficulty w/ IV access/obtaining blood draw lead to some prolongation of patient's stay in the ER. Ultimately blood was able to be drawn w/ IV access.  CBC: No leukocytosis or anemia. CMP: No significant electrolyte disturbance.  Renal function and LFTs are preserved.  Given  patient remains uncomfortable and is tender in LLQ will proceed w/ CT abdomen/pelvis.   CT: No acute abnormality. Interval visualization of a 1.4 cm mass in the right lobe of the liver. This could represent a cyst, hemangioma or adenoma.  Overall unclear etiology to patient's discomfort. On repeat abdominal exam patient remains without peritoneal signs, doubt cholecystitis, pancreatitis, diverticulitis, appendicitis, bowel obstruction/perforation, or ectopic pregnancy.  She only had minimal discharge on exam, she is sexually active and monogamous relationship, she is not concerned for STD, presentation does not seem consistent with PID.  Patient tolerating PO in the emergency department & feeling a bit better. Will discharge home with supportive measures. & flagyl for BV. I discussed results, treatment plan, need for PCP & obgyn follow-up, and return precautions with the patient. Provided opportunity for questions, patient confirmed understanding and is in agreement with plan.    This is a shared visit with supervising physician Dr. Ashok Cordia who has independently evaluated patient & provided guidance in evaluation/management/disposition, in agreement with care   Final Clinical Impressions(s) / ED Diagnoses   Final diagnoses:  Abdominal pain, unspecified abdominal location  Bacterial vaginosis    ED Discharge Orders         Ordered    naproxen (NAPROSYN) 500 MG tablet  2 times daily     11/12/18 1702    metroNIDAZOLE (FLAGYL) 500 MG tablet  2 times daily     11/12/18 [address] [place], PA-C 11/12/18 1714    Lajean Saver, MD 11/14/18 1542

## 2018-11-12 NOTE — ED Notes (Signed)
Attempted IV x 2, unsuccessful, ED PA informed

## 2018-11-12 NOTE — ED Notes (Signed)
CT awaiting results of bun/creat/egfr prior to imaging with IV contrast

## 2018-11-13 LAB — GC/CHLAMYDIA PROBE AMP (~~LOC~~) NOT AT ARMC
Chlamydia: NEGATIVE
Neisseria Gonorrhea: NEGATIVE

## 2018-11-14 ENCOUNTER — Other Ambulatory Visit: Payer: Self-pay

## 2018-11-14 ENCOUNTER — Encounter: Payer: Self-pay | Admitting: Nurse Practitioner

## 2018-11-14 ENCOUNTER — Ambulatory Visit: Payer: Medicaid Other | Admitting: Nurse Practitioner

## 2018-11-14 VITALS — BP 116/78 | HR 76 | Temp 98.3°F | Ht 66.8 in | Wt 289.6 lb

## 2018-11-14 DIAGNOSIS — Z800 Family history of malignant neoplasm of digestive organs: Secondary | ICD-10-CM

## 2018-11-14 DIAGNOSIS — M791 Myalgia, unspecified site: Secondary | ICD-10-CM | POA: Diagnosis not present

## 2018-11-14 DIAGNOSIS — R932 Abnormal findings on diagnostic imaging of liver and biliary tract: Secondary | ICD-10-CM | POA: Diagnosis not present

## 2018-11-14 DIAGNOSIS — R935 Abnormal findings on diagnostic imaging of other abdominal regions, including retroperitoneum: Secondary | ICD-10-CM

## 2018-11-14 NOTE — Progress Notes (Signed)
Subjective:     Patient ID: Rebecca Day , female    DOB: 03-15-1986 , 33 y.o.   MRN: [id number]   Chief Complaint  Patient presents with  . Follow-up    HPI  She is here to follow up after ED visit for abdominal pain and had an incidental finding of 1.4cm mass to liver.  She has family history of her brother had liver transplant.  Father deceased from liver cancer (initial age 49's went into remission then came back).  Grandmother - diabetes.  Urine is regular color.  She is having lower abdominal pressure.  She is having nausea.    She is going to see a Lupus provider on April 2nd.    Abdominal Pain  This is a chronic problem. The current episode started more than 1 year ago. The onset quality is gradual. Progression since onset: worse at night. The quality of the pain is aching. The abdominal pain radiates to the LLQ and RLQ. Associated symptoms include nausea (awakens with nausea). Pertinent negatives include no constipation, diarrhea or headaches.     Past Medical History:  Diagnosis Date  . Asthma   . Elevated blood sugar   . Kidney stones   . Lupus ([place])    questionable, no meds or treatment per pt  . 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[place])   . Ovarian cyst      Family History  Problem Relation Age of Onset  . Hypertension Mother   . Cancer Father   . Kidney disease Brother   . Liver disease Brother   . Hyperlipidemia Maternal Grandmother      Current Outpatient Medications:  .  acetaminophen (TYLENOL) 500 MG tablet, Take 1,000 mg by mouth every 6 (six) hours as needed for moderate pain., Disp: , Rfl:  .  buPROPion (WELLBUTRIN XL) 150 MG 24 hr tablet, Take 1 tablet (150 mg total) by mouth every morning., Disp: 30 tablet, Rfl: 2 .  hydroxychloroquine (PLAQUENIL) 200 MG tablet, Take 1 tablet (200 mg total) by mouth 2 (two) times daily., Disp: 60 tablet, Rfl: 2 .  ibuprofen (ADVIL,MOTRIN) 400 MG tablet, Take 1 tablet (400 mg total) by mouth every 6 (six) hours as needed., Disp: 30  tablet, Rfl: 0 .  metroNIDAZOLE (FLAGYL) 500 MG tablet, Take 1 tablet (500 mg total) by mouth 2 (two) times daily., Disp: 14 tablet, Rfl: 0 .  naproxen (NAPROSYN) 500 MG tablet, Take 1 tablet (500 mg total) by mouth 2 (two) times daily., Disp: 10 tablet, Rfl: 0   Allergies  Allergen Reactions  . Penicillins Hives    Has patient had a PCN reaction causing immediate rash, facial/tongue/throat swelling, SOB or lightheadedness with hypotension: Yes Has patient had a PCN reaction causing severe rash involving mucus membranes or skin necrosis: No Has patient had a PCN reaction that required hospitalization: Yes Has patient had a PCN reaction occurring within the last 10 years: NO If all of the above answers are "NO", then may proceed with Cephalosporin use.     Review of Systems  Constitutional: Negative for fatigue.  Respiratory: Negative for cough.   Cardiovascular: Negative.  Negative for chest pain, palpitations and leg swelling.  Gastrointestinal: Positive for abdominal pain and nausea (awakens with nausea). Negative for constipation and diarrhea.  Neurological: Negative for dizziness and headaches.     Today's Vitals   11/14/18 1049  BP: 116/78  Pulse: 76  Temp: 98.3 F (36.8 C)  TempSrc: Oral  Weight: 289 lb 9.6 oz (131.4 kg)  Height: 5' 6.8" (1.697 m)   Body mass index is 45.63 kg/m.   Objective:  Physical Exam Eyes:     Extraocular Movements: Extraocular movements intact.     Pupils: Pupils are equal, round, and reactive to light.  Abdominal:     General: Abdomen is flat. There is no distension.     Palpations: Abdomen is soft. There is no mass.     Tenderness: There is no rebound.     Hernia: No hernia is present.         Assessment And Plan:     1. Muscle ache  Continues to have aching muscles to her joints, will check autoimmune panel - Autoimmune Profile - Sed Rate (ESR)  2. Abnormal CT of the abdomen  Had been having abdominal pain and seen at the  and had a CT of abdomen which showed an incidental finding of a liver mass approximately 1.4cm. She has a previous family history of liver failure which is an unknown cause. - Hepatitis C antibody - Ambulatory referral to Gastroenterology  3. Abnormal CT of liver  See # 2.   - Ambulatory referral to Gastroenterology       [name], FNP

## 2018-11-15 ENCOUNTER — Telehealth: Payer: Self-pay | Admitting: *Deleted

## 2018-11-15 ENCOUNTER — Ambulatory Visit
Admission: RE | Admit: 2018-11-15 | Discharge: 2018-11-15 | Disposition: A | Discharge status: Home / Self Care | Payer: Medicaid Other | Source: Ambulatory Visit | Attending: Nurse Practitioner | Admitting: Nurse Practitioner

## 2018-11-15 ENCOUNTER — Encounter: Payer: Self-pay | Admitting: Nurse Practitioner

## 2018-11-15 DIAGNOSIS — Z87448 Personal history of other diseases of urinary system: Secondary | ICD-10-CM

## 2018-11-15 DIAGNOSIS — R109 Unspecified abdominal pain: Secondary | ICD-10-CM

## 2018-11-15 LAB — AUTOIMMUNE PROFILE
Anti Nuclear Antibody(ANA): NEGATIVE
Complement C3, Serum: 183 mg/dL — ABNORMAL HIGH (ref 82–167)
dsDNA Ab: <1 IU/mL (ref 0–9)

## 2018-11-15 LAB — SEDIMENTATION RATE: SED RATE: 32 mm/h (ref 0–32)

## 2018-11-15 LAB — HEPATITIS C ANTIBODY: Hep C Virus Ab: <0.1 s/co ratio (ref 0.0–0.9)

## 2018-11-15 NOTE — Telephone Encounter (Signed)
Called pt about her upcoming appointment.  Pt requested to be rescheduled in 1 week.  Informed pt that front office staff would be in contact with her to move her appointment.

## 2018-11-16 ENCOUNTER — Other Ambulatory Visit: Payer: Self-pay | Admitting: Nurse Practitioner

## 2018-11-16 ENCOUNTER — Ambulatory Visit: Payer: Self-pay | Admitting: Obstetrics and Gynecology

## 2018-11-16 DIAGNOSIS — R110 Nausea: Secondary | ICD-10-CM

## 2018-11-16 MED ORDER — ONDANSETRON 4 MG PO TBDP: 4.0000 mg | ORAL_TABLET | Freq: Three times a day (TID) | ORAL | 0 refills | Status: DC | PRN

## 2018-11-16 MED ORDER — PREDNISONE 10 MG (21) PO TBPK: ORAL_TABLET | ORAL | 0 refills | Status: DC

## 2018-11-21 ENCOUNTER — Encounter: Payer: Self-pay | Admitting: Nurse Practitioner

## 2018-11-21 MED ORDER — BUPROPION HCL ER (XL) 150 MG PO TB24: 150.0000 mg | ORAL_TABLET | ORAL | 2 refills | Status: DC

## 2018-12-01 ENCOUNTER — Ambulatory Visit (INDEPENDENT_AMBULATORY_CARE_PROVIDER_SITE_OTHER): Payer: Medicaid Other | Admitting: Obstetrics & Gynecology

## 2018-12-01 ENCOUNTER — Other Ambulatory Visit: Payer: Self-pay

## 2018-12-01 DIAGNOSIS — O039 Complete or unspecified spontaneous abortion without complication: Secondary | ICD-10-CM | POA: Diagnosis not present

## 2018-12-01 DIAGNOSIS — N96 Recurrent pregnancy loss: Secondary | ICD-10-CM

## 2018-12-01 DIAGNOSIS — F439 Reaction to severe stress, unspecified: Secondary | ICD-10-CM

## 2018-12-01 NOTE — Progress Notes (Signed)
TELEHEALTH VIRTUAL GYNECOLOGY VISIT ENCOUNTER NOTE  I connected with Rebecca Day on 12/01/18 at  9:15 AM EDT by telephone at home and verified that I am speaking with the correct person using two identifiers.   I discussed the limitations, risks, security and privacy concerns of performing an evaluation and management service by telephone and the availability of in person appointments. I also discussed with the patient that there may be a patient responsible charge related to this service. The patient expressed understanding and agreed to proceed.   History:  Rebecca Day is a 33 y.o. [id number] (56 and 47 yo kids) female being evaluated today for . She denies any abnormal vaginal discharge, bleeding, pelvic pain or other concerns.       Past Medical History:  Diagnosis Date   Asthma    Elevated blood sugar    Kidney stones    Lupus ([place])    questionable, no meds or treatment per pt   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[place])    Ovarian cyst    Past Surgical History:  Procedure Laterality Date   CESAREAN SECTION     C/S x 2   PILONIDAL CYST DRAINAGE     The following portions of the patient's history were reviewed and updated as appropriate: allergies, current medications, past family history, past medical history, past social history, past surgical history and problem list.   Health Maintenance:  Normal pap and negative HRHPV on   Review of Systems:  Pertinent items noted in HPI and remainder of comprehensive ROS otherwise negative. She works from home in [employer].  Physical Exam:  Physical exam deferred due to nature of the encounter  Labs and Imaging No results found for this or any previous visit (from the past 336 hour(s)). US Transvaginal Non-ob  Result Date: 11/12/2018 CLINICAL DATA:  Patient with left lower quadrant abdominal pain for 3 days. EXAM: TRANSABDOMINAL AND TRANSVAGINAL ULTRASOUND OF PELVIS DOPPLER ULTRASOUND OF OVARIES TECHNIQUE: Both transabdominal and  transvaginal ultrasound examinations of the pelvis were performed. Transabdominal technique was performed for global imaging of the pelvis including uterus, ovaries, adnexal regions, and pelvic cul-de-sac. It was necessary to proceed with endovaginal exam following the transabdominal exam to visualize the adnexal structures. Color and duplex Doppler ultrasound was utilized to evaluate blood flow to the ovaries. COMPARISON:  Pelvic ultrasound 10/19/2018 FINDINGS: Uterus Measurements: 10.9 x 5.6 x 5.6 cm = volume: 176 mL. No fibroids or other mass visualized. Endometrium Thickness: 15 mm.  No focal abnormality visualized. Right ovary Not visualized. Left ovary Limited visualization and assessment of the left ovary given posterior location. Measurements: 2.8 x 2.5 x 2.8 cm = volume: 10 mL. Normal appearance/no adnexal mass. Pulsed Doppler evaluation of the left ovary demonstrates no arterial or venous waveforms identified. It is unclear if this is secondary to the location of the ovary, both deep within the pelvis and posterior to the uterus or true non vascular flow. Other findings Trace fluid IMPRESSION: The left ovary is located deep within the pelvis, posterior to the uterus, limiting evaluation. No arterial or venous waveforms are demonstrated within the left ovary which may be secondary to technique from the deep/posterior location of the ovary or potentially ovarian torsion. These results were called by telephone at the time of interpretation on 11/12/2018 at 12:13 pm to [facility] , who verbally acknowledged these results. Electronically Signed   By: Lovey Newcomer M.D.   On: 11/12/2018 12:15   US Pelvis Complete  Result Date: 11/12/2018 CLINICAL  DATA:  Patient with left lower quadrant abdominal pain for 3 days. EXAM: TRANSABDOMINAL AND TRANSVAGINAL ULTRASOUND OF PELVIS DOPPLER ULTRASOUND OF OVARIES TECHNIQUE: Both transabdominal and transvaginal ultrasound examinations of the pelvis were performed.  Transabdominal technique was performed for global imaging of the pelvis including uterus, ovaries, adnexal regions, and pelvic cul-de-sac. It was necessary to proceed with endovaginal exam following the transabdominal exam to visualize the adnexal structures. Color and duplex Doppler ultrasound was utilized to evaluate blood flow to the ovaries. COMPARISON:  Pelvic ultrasound 10/19/2018 FINDINGS: Uterus Measurements: 10.9 x 5.6 x 5.6 cm = volume: 176 mL. No fibroids or other mass visualized. Endometrium Thickness: 15 mm.  No focal abnormality visualized. Right ovary Not visualized. Left ovary Limited visualization and assessment of the left ovary given posterior location. Measurements: 2.8 x 2.5 x 2.8 cm = volume: 10 mL. Normal appearance/no adnexal mass. Pulsed Doppler evaluation of the left ovary demonstrates no arterial or venous waveforms identified. It is unclear if this is secondary to the location of the ovary, both deep within the pelvis and posterior to the uterus or true non vascular flow. Other findings Trace fluid IMPRESSION: The left ovary is located deep within the pelvis, posterior to the uterus, limiting evaluation. No arterial or venous waveforms are demonstrated within the left ovary which may be secondary to technique from the deep/posterior location of the ovary or potentially ovarian torsion. These results were called by telephone at the time of interpretation on 11/12/2018 at 12:13 pm to [facility] , who verbally acknowledged these results. Electronically Signed   By: Lovey Newcomer M.D.   On: 11/12/2018 12:15   Ct Abdomen Pelvis W Contrast  Result Date: 11/12/2018 CLINICAL DATA:  Lower abdominal pain, worse on the left, for the past 2 days. Nausea. Fever 2 days ago. EXAM: CT ABDOMEN AND PELVIS WITH CONTRAST TECHNIQUE: Multidetector CT imaging of the abdomen and pelvis was performed using the standard protocol following bolus administration of intravenous contrast. CONTRAST:  171mL  OMNIPAQUE IOHEXOL 300 MG/ML  SOLN COMPARISON:  Pelvic ultrasound dated 10/19/2018 and abdomen and pelvis CT dated 07/29/2017. Abdomen and pelvis CT with contrast dated 06/25/2014 and 05/18/2014. FINDINGS: Lower chest: Minimal bilateral dependent atelectasis. Hepatobiliary: 1.4 cm rounded area of low density with indistinct margins and mild heterogeneity in the liver on the right on image number 19 series 2, not seen on the previous examinations. Normal appearing gallbladder. Pancreas: Unremarkable. No pancreatic ductal dilatation or surrounding inflammatory changes. Spleen: Normal in size without focal abnormality. Adrenals/Urinary Tract: Adrenal glands are unremarkable. Kidneys are normal, without renal calculi, focal lesion, or hydronephrosis. Bladder is unremarkable. Stomach/Bowel: Stomach is within normal limits. Appendix appears normal. No evidence of bowel wall thickening, distention, or inflammatory changes. Vascular/Lymphatic: No significant vascular findings are present. No enlarged abdominal or pelvic lymph nodes. Reproductive: Uterus and bilateral adnexa are unremarkable. Other: No abdominal wall hernia or abnormality. No abdominopelvic ascites. Musculoskeletal: Minimal lumbar and lower thoracic spine degenerative changes. IMPRESSION: 1. No acute abnormality. 2. Interval visualization of a 1.4 cm mass in the right lobe of the liver. This could represent a cyst, hemangioma or adenoma. Electronically Signed   By: Claudie Revering M.D.   On: 11/12/2018 15:54   US Renal  Result Date: 11/15/2018 CLINICAL DATA:  Flank pain history of hematuria EXAM: RENAL / URINARY TRACT ULTRASOUND COMPLETE COMPARISON:  CT 11/12/2018 FINDINGS: Right Kidney: Renal measurements: 10.7 x 4.8 x 4.9 cm = volume: 131.5 mL . Echogenicity within normal limits. No mass or  hydronephrosis visualized. Left Kidney: Renal measurements: 11.7 x 6.9 x 6.5 cm = volume: 274.8 mL. Echogenicity within normal limits. No mass or hydronephrosis  visualized. Bladder: Appears normal for degree of bladder distention. IMPRESSION: Negative renal ultrasound Electronically Signed   By: Donavan Foil M.D.   On: 11/15/2018 15:28   [place] Art/ven Flow Abd Pelv Doppler  Result Date: 11/12/2018 CLINICAL DATA:  Patient with left lower quadrant abdominal pain for 3 days. EXAM: TRANSABDOMINAL AND TRANSVAGINAL ULTRASOUND OF PELVIS DOPPLER ULTRASOUND OF OVARIES TECHNIQUE: Both transabdominal and transvaginal ultrasound examinations of the pelvis were performed. Transabdominal technique was performed for global imaging of the pelvis including uterus, ovaries, adnexal regions, and pelvic cul-de-sac. It was necessary to proceed with endovaginal exam following the transabdominal exam to visualize the adnexal structures. Color and duplex Doppler ultrasound was utilized to evaluate blood flow to the ovaries. COMPARISON:  Pelvic ultrasound 10/19/2018 FINDINGS: Uterus Measurements: 10.9 x 5.6 x 5.6 cm = volume: 176 mL. No fibroids or other mass visualized. Endometrium Thickness: 15 mm.  No focal abnormality visualized. Right ovary Not visualized. Left ovary Limited visualization and assessment of the left ovary given posterior location. Measurements: 2.8 x 2.5 x 2.8 cm = volume: 10 mL. Normal appearance/no adnexal mass. Pulsed Doppler evaluation of the left ovary demonstrates no arterial or venous waveforms identified. It is unclear if this is secondary to the location of the ovary, both deep within the pelvis and posterior to the uterus or true non vascular flow. Other findings Trace fluid IMPRESSION: The left ovary is located deep within the pelvis, posterior to the uterus, limiting evaluation. No arterial or venous waveforms are demonstrated within the left ovary which may be secondary to technique from the deep/posterior location of the ovary or potentially ovarian torsion. These results were called by telephone at the time of interpretation on 11/12/2018 at 12:13 pm to [facility] , who verbally acknowledged these results. Electronically Signed   By: Lovey Newcomer M.D.   On: 11/12/2018 12:15      Assessment and Plan:     There are no diagnoses linked to this encounter.     Miscarriage, recurrent, with lupus- will need recheck of QBHCG Stress- rec appt with Roselyn Reef Contraception- undecided at the moment, she wants more babies Rec MVI/PNVs Rec Dr. Kerin Perna but she cannot afford this now, so I will work with her in trying to prevent future losses. Come in for in person visit in 3 months   I discussed the assessment and treatment plan with the patient. The patient was provided an opportunity to ask questions and all were answered. The patient agreed with the plan and demonstrated an understanding of the instructions.   The patient was advised to call back or seek an in-person evaluation/go to the ED if the symptoms worsen or if the condition fails to improve as anticipated.  I provided 10 minutes of non-face-to-face time during this encounter.   Emily Filbert, MD [facility], [place]

## 2018-12-01 NOTE — Progress Notes (Signed)
I connected with  Rebecca Day on 12/01/18 at  9:15 AM EDT by telephone and verified that I am speaking with the correct person using two identifiers.   I discussed the limitations, risks, security and privacy concerns of performing an evaluation and management service by telephone and the availability of in person appointments. I also discussed with the patient that there may be a patient responsible charge related to this service. The patient expressed understanding and agreed to proceed.  Dolores Hoose, RN 12/01/2018  9:21 AM

## 2018-12-04 ENCOUNTER — Other Ambulatory Visit: Payer: Medicaid Other

## 2018-12-04 ENCOUNTER — Other Ambulatory Visit: Payer: Self-pay

## 2018-12-04 DIAGNOSIS — O039 Complete or unspecified spontaneous abortion without complication: Secondary | ICD-10-CM

## 2018-12-05 ENCOUNTER — Other Ambulatory Visit: Payer: Self-pay | Admitting: Gastroenterology

## 2018-12-05 ENCOUNTER — Encounter: Payer: Self-pay | Admitting: Nurse Practitioner

## 2018-12-05 DIAGNOSIS — Z86718 Personal history of other venous thrombosis and embolism: Secondary | ICD-10-CM | POA: Insufficient documentation

## 2018-12-05 DIAGNOSIS — M791 Myalgia, unspecified site: Secondary | ICD-10-CM | POA: Insufficient documentation

## 2018-12-05 DIAGNOSIS — R9389 Abnormal findings on diagnostic imaging of other specified body structures: Secondary | ICD-10-CM

## 2018-12-05 DIAGNOSIS — R932 Abnormal findings on diagnostic imaging of liver and biliary tract: Secondary | ICD-10-CM | POA: Insufficient documentation

## 2018-12-05 LAB — BETA HCG QUANT (REF LAB): hCG Quant: <1 m[IU]/mL

## 2018-12-06 ENCOUNTER — Ambulatory Visit (INDEPENDENT_AMBULATORY_CARE_PROVIDER_SITE_OTHER): Payer: Medicaid Other | Admitting: Gastroenterology

## 2018-12-06 ENCOUNTER — Other Ambulatory Visit: Payer: Self-pay

## 2018-12-06 ENCOUNTER — Encounter: Payer: Self-pay | Admitting: Gastroenterology

## 2018-12-06 VITALS — Ht 70.0 in | Wt 290.0 lb

## 2018-12-06 DIAGNOSIS — R1012 Left upper quadrant pain: Secondary | ICD-10-CM | POA: Diagnosis not present

## 2018-12-06 DIAGNOSIS — R160 Hepatomegaly, not elsewhere classified: Secondary | ICD-10-CM

## 2018-12-06 DIAGNOSIS — Z800 Family history of malignant neoplasm of digestive organs: Secondary | ICD-10-CM

## 2018-12-06 MED ORDER — DICYCLOMINE HCL 20 MG PO TABS: 20.0000 mg | ORAL_TABLET | Freq: Three times a day (TID) | ORAL | 3 refills | Status: DC

## 2018-12-06 NOTE — Patient Instructions (Addendum)
I recommend some dietary changes given your abdominal pain: - No carbonated beverages - No artificial sweeteners - Full lactose free trial: 3 weeks NO, milk, cheese, sour cream,ice cream, yogurt, creamer, baked goods (cookies/cakes/watch breads), chocolate (even dark chocolate), protein bars.  I have recommended some labs and stool studies.   Have the MRI of the liver on Friday as planned.   Trial of dicyclomine 20 mg four times daily as needed for pain.  I am recommended an EGD and a colonoscopy when Covid restrictions have been lifted.  Please call with any questions or concerns in the meantime.   Thank you for your patience with me and our technology today! Please stay home, safe, and healthy. I look forward to meeting you in person in the future.    Lactose-Free Diet, Adult If you have lactose intolerance, you are not able to digest lactose. Lactose is a natural sugar found mainly in dairy milk and dairy products. You may need to avoid all foods and beverages that contain lactose. A lactose-free diet can help you do this. Which foods have lactose? Lactose is found in dairy milk and dairy products, such as:  Yogurt.  Cheese.  Butter.  Margarine.  Sour cream.  Cream.  Whipped toppings and nondairy creamers.  Ice cream and other dairy-based desserts. Lactose is also found in foods or products made with dairy milk or milk ingredients. To find out whether a food contains dairy milk or a milk ingredient, look at the ingredients list. Avoid foods with the statement "May contain milk" and foods that contain:  Milk powder.  Whey.  Curd.  Caseinate.  Lactose.  Lactalbumin.  Lactoglobulin. What are alternatives to dairy milk and foods made with milk products?  Lactose-free milk.  Soy milk with added calcium and vitamin D.  Almond milk, coconut milk, rice milk, or other nondairy milk alternatives with added calcium and vitamin D. Note that these are low in  protein.  Soy products, such as soy yogurt, soy cheese, soy ice cream, and soy-based sour cream.  Other nut milk products, such as almond yogurt, almond cheese, cashew yogurt, cashew cheese, cashew ice cream, coconut yogurt, and coconut ice cream. What are tips for following this plan?  Do not consume foods, beverages, vitamins, minerals, or medicines containing lactose. Read ingredient lists carefully.  Look for the words "lactose-free" on labels.  Use lactase enzyme drops or tablets as directed by your health care provider.  Use lactose-free milk or a milk alternative, such as soy milk or almond milk, for drinking and cooking.  Make sure you get enough calcium and vitamin D in your diet. A lactose-free eating plan can be lacking in these important nutrients.  Take calcium and vitamin D supplements as directed by your health care provider. Talk to your health care provider about supplements if you are not able to get enough calcium and vitamin D from food. What foods can I eat?  Fruits All fresh, canned, frozen, or dried fruits that are not processed with lactose. Vegetables All fresh, frozen, and canned vegetables without cheese, cream, or butter sauces. Grains Any that are not made with dairy milk or dairy products. Meats and other proteins Any meat, fish, poultry, and other protein sources that are not made with dairy milk or dairy products. Soy cheese and yogurt. Fats and oils Any that are not made with dairy milk or dairy products. Beverages Lactose-free milk. Soy, rice, or almond milk with added calcium and vitamin D. Fruit and  vegetable juices. Sweets and desserts Any that are not made with dairy milk or dairy products. Seasonings and condiments Any that are not made with dairy milk or dairy products. Calcium Calcium is found in many foods that contain lactose and is important for bone health. The amount of calcium you need depends on your age:  Adults younger than 50  years: 1,000 mg of calcium a day.  Adults older than 50 years: 1,200 mg of calcium a day. If you are not getting enough calcium, you may get it from other sources, including:  Orange juice with calcium added. There are 300-350 mg of calcium in 1 cup of orange juice.  Calcium-fortified soy milk. There are 300-400 mg of calcium in 1 cup of calcium-fortified soy milk.  Calcium-fortified rice or almond milk. There are 300 mg of calcium in 1 cup of calcium-fortified rice or almond milk.  Calcium-fortified breakfast cereals. There are 100-1,000 mg of calcium in calcium-fortified breakfast cereals.  Spinach, cooked. There are 145 mg of calcium in  cup of cooked spinach.  Edamame, cooked. There are 130 mg of calcium in  cup of cooked edamame.  Collard greens, cooked. There are 125 mg of calcium in  cup of cooked collard greens.  Kale, frozen or cooked. There are 90 mg of calcium in  cup of cooked or frozen kale.  Almonds. There are 95 mg of calcium in  cup of almonds.  Broccoli, cooked. There are 60 mg of calcium in 1 cup of cooked broccoli. The items listed above may not be a complete list of recommended foods and beverages. Contact a dietitian for more options. What foods are not recommended? Fruits None, unless they are made with dairy milk or dairy products. Vegetables None, unless they are made with dairy milk or dairy products. Grains Any grains that are made with dairy milk or dairy products. Meats and other proteins None, unless they are made with dairy milk or dairy products. Dairy All dairy products, including milk, goat's milk, buttermilk, kefir, acidophilus milk, flavored milk, evaporated milk, condensed milk, dulce de [place], eggnog, yogurt, cheese, and cheese spreads. Fats and oils Any that are made with milk or milk products. Margarines and salad dressings that contain milk or cheese. Cream. Half and half. Cream cheese. Sour cream. Chip dips made with sour cream or  yogurt. Beverages Hot chocolate. Cocoa with lactose. Instant iced teas. Powdered fruit drinks. Smoothies made with dairy milk or yogurt. Sweets and desserts Any that are made with milk or milk products. Seasonings and condiments Chewing gum that has lactose. Spice blends if they contain lactose. Artificial sweeteners that contain lactose. Nondairy creamers. The items listed above may not be a complete list of foods and beverages to avoid. Contact a dietitian for more information. Summary  If you are lactose intolerant, it means that you have a hard time digesting lactose, a natural sugar found in milk and milk products.  Following a lactose-free diet can help you manage this condition.  Calcium is important for bone health and is found in many foods that contain lactose. Talk with your health care provider about other sources of calcium. This information is not intended to replace advice given to you by your health care provider. Make sure you discuss any questions you have with your health care provider. Document Released: 02/05/2002 Document Revised: 09/13/2017 Document Reviewed: 09/13/2017 Elsevier Interactive Patient Education  2019 [employer].

## 2018-12-06 NOTE — Progress Notes (Signed)
TELEHEALTH VISIT  Referring Provider: [name], FNP Primary Care Physician:  [name], FNP   Tele-visit due to COVID-19 pandemic Patient requested visit virtually, consented to the virtual encounter via WebEx Contact made at: 12/06/18 9:30 Patient verified by name and date of birth Location of patient: Home Location provider: My medical office Names of persons participating: Me, patient, Tinnie Gens CMA Time spent on telehealth visit: 34 minutes  Reason for Consultation:  Liver Mass   IMPRESSION:  LUQ abdominal pain and left groin pain 1.4 cm mass on CT scan Family History of colon cancer (father in his 53s) Family history of colon polyps (mother, precancerous polyps) ESR 11/14/18 at 34   Etiology of left upper quadrant abdominal pain and left groin pain is unclear.  Recent contrasted CT of the abdomen and pelvis showed no obvious etiology. The identified liver mass is too small to cause these symptoms.  At this time will check for IBS masqueraders including celiac disease, IBD, giardia, lactose/fructose/gluten intolerance, SIBO, thyroid disorder.   MRI indicated to evaluate the abnormal CT scan. Unclear if it is hemangioma or FNH. With and without contrast, the MRI should allow for definitive diagnosis. She has no risk factors for chronic viral hepatitis. No history, labs, or imaging evidence of chronic liver disease. She does not use OCPs.    PLAN: Obtain records from Dr. Benson Norway MRI of the liver (planned for Friday) Labs: cbc, cmp, TSH, ESR, giardia, fecal calprotectin Trial of dicyclomine 20 mg QID EGD/Colonoscopy when Covid restrictions have been lifted Consider Breath test for SIBO and if negative fructose testing if the above testing is negative  I consented the patient discussing the risks, benefits, and alternatives to endoscopic evaluation. In particular, we discussed the risks that include, but are not limited to, reaction to medication, cardiopulmonary  compromise, bleeding requiring blood transfusion, aspiration resulting in pneumonia, perforation requiring surgery, lack of diagnosis, severe illness requiring hospitalization, and even death. We reviewed the risk of missed lesion including polyps or even cancer. The patient acknowledges these risks and asks that we proceed.   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 Referred by NP Laurance Flatten after an abnormal CT scan.   Recent miscarriage.  The history is obtained through the patient and review of her electronic health record.  Her PCP recently referred her to [facility].  She was recently evaluated by Dr. Benson Norway.  She wanted to keep this appointment for a second opinion.  Two years of intense, constant left upper abdominal pain. Usually a dull ache but it can be hard, especially at night. Associated early satiety.  Eating may exacerbated her LUQ pain. Radiates down although not to one place in particular. Occassional left pelvic/groin pain. Can be tender to touch in the groin. Hard to lie on her left side due to exacerbation of the pain. Prevously thought it could be endometriosis.  Has alternating diarrhea and constipation. Frequent mucous. Previously told that she has IBS. No blood in the stool.   Has kept a symptom journal and has been unable to identify specific triggers. Stools more often soft and mushy.  Some improvement with defecation but not complete relief.   Trying acetaminophen. Avoiding ibuoprofen because she knows it can disrupt her GI tract.  Heating pads/packs provide no relief.   No other associated symptoms. No identified exacerbating or relieving features.   Father with abdominal cancer in his mid21s- but the time she learned of it it  was metastasized. It may have been colon cancer.  Mother with precancerous polyps presented with rectal bleeding.   Brother with cancer that metastasized to his liver that presented with liver cancer - ?  Colon cancer.  Maternal great aunt was diagnosed with colon cancer in her 38s. Maternal cousin with colon cancer in her 39s.   No other known family history of colon cancer or polyps. No family history of uterine/endometrial cancer, pancreatic cancer or gastric/stomach cancer.  No prior endoscopic evaluation.  CT abd/pelvis with contrast 11/12/18 for lower abdominal pain showed a 1.4 cm mass in the right lobe of the liver. Otherwise, no source identified for the abdominal pain.  Labs 11/12/18: normal CMP including AST 17, ALT 16, alk phos 74, TB 0.4 Labs 11/14/18: ESR 34  Past Medical History:  Diagnosis Date   Asthma    Elevated blood sugar    Kidney stones    Lupus ([place])    questionable, no meds or treatment per pt   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[place])    Ovarian cyst     Past Surgical History:  Procedure Laterality Date   CESAREAN SECTION     C/S x 2   PILONIDAL CYST DRAINAGE      Current Outpatient Medications  Medication Sig Dispense Refill   acetaminophen (TYLENOL) 500 MG tablet Take 1,000 mg by mouth every 6 (six) hours as needed for moderate pain.     buPROPion (WELLBUTRIN XL) 150 MG 24 hr tablet Take 1 tablet (150 mg total) by mouth every morning. 30 tablet 2   hydroxychloroquine (PLAQUENIL) 200 MG tablet Take 1 tablet (200 mg total) by mouth 2 (two) times daily. 60 tablet 2   ibuprofen (ADVIL,MOTRIN) 400 MG tablet Take 1 tablet (400 mg total) by mouth every 6 (six) hours as needed. 30 tablet 0   ondansetron (ZOFRAN-ODT) 4 MG disintegrating tablet Take 1 tablet (4 mg total) by mouth every 8 (eight) hours as needed for nausea or vomiting. 20 tablet 0   No current facility-administered medications for this visit.     Allergies as of 12/06/2018 - Review Complete 12/06/2018  Allergen Reaction Noted   Penicillins Hives 07/06/2011    Family History  Problem Relation Age of Onset   Hypertension Mother    Cancer Father    Kidney disease Brother    Liver disease  Brother    Hyperlipidemia Maternal Grandmother     Social History   Socioeconomic History   Marital status: Single    Spouse name: Not on file   Number of children: Not on file   Years of education: Not on file   Highest education level: Not on file  Occupational History   Not on file  Social Needs   Financial resource strain: Not on file   Food insecurity:    Worry: Not on file    Inability: Not on file   Transportation needs:    Medical: Not on file    Non-medical: Not on file  Tobacco Use   Smoking status: Never Smoker   Smokeless tobacco: Never Used  Substance and Sexual Activity   Alcohol use: Not Currently    Comment: socially   Drug use: No   Sexual activity: Never    Birth control/protection: None  Lifestyle   Physical activity:    Days per week: Not on file    Minutes per session: Not on file   Stress: Not on file  Relationships   Social connections:    Talks on  phone: Not on file    Gets together: Not on file    Attends religious service: Not on file    Active member of club or organization: Not on file    Attends meetings of clubs or organizations: Not on file    Relationship status: Not on file   Intimate partner violence:    Fear of current or ex partner: Not on file    Emotionally abused: Not on file    Physically abused: Not on file    Forced sexual activity: Not on file  Other Topics Concern   Not on file  Social History Narrative   Not on file    Review of Systems: ALL ROS discussed and all others negative except listed in HPI.  Physical Exam: General: in no acute distress Neuro: Alert and appropriate Psych: Normal affect and normal insight Abd: localized the pain in the LUQ and left groin.   Assad Harbeson L. Tarri Glenn, MD, MPH [facility] 12/06/2018, 9:28 AM

## 2018-12-08 ENCOUNTER — Other Ambulatory Visit: Payer: Self-pay

## 2018-12-08 ENCOUNTER — Ambulatory Visit
Admission: RE | Admit: 2018-12-08 | Discharge: 2018-12-08 | Disposition: A | Discharge status: Home / Self Care | Payer: Medicaid Other | Source: Ambulatory Visit | Attending: Gastroenterology | Admitting: Gastroenterology

## 2018-12-08 DIAGNOSIS — R9389 Abnormal findings on diagnostic imaging of other specified body structures: Secondary | ICD-10-CM

## 2018-12-08 MED ORDER — GADOBENATE DIMEGLUMINE 529 MG/ML IV SOLN
20.0000 mL | Freq: Once | INTRAVENOUS | Status: AC | PRN
  Administered 2018-12-08: 20 mL via INTRAVENOUS

## 2018-12-18 NOTE — Addendum Note (Signed)
Addended by: Donell Beers on: 12/18/2018 09:26 PM   Modules accepted: Level of Service

## 2018-12-21 ENCOUNTER — Encounter: Payer: Self-pay | Admitting: Nurse Practitioner

## 2018-12-21 ENCOUNTER — Other Ambulatory Visit: Payer: Self-pay

## 2018-12-21 ENCOUNTER — Ambulatory Visit: Payer: Medicaid Other | Admitting: Nurse Practitioner

## 2018-12-21 VITALS — BP 128/86 | HR 94 | Temp 97.8°F | Ht 68.0 in | Wt 296.2 lb

## 2018-12-21 DIAGNOSIS — R1032 Left lower quadrant pain: Secondary | ICD-10-CM | POA: Diagnosis not present

## 2018-12-21 DIAGNOSIS — R110 Nausea: Secondary | ICD-10-CM | POA: Diagnosis not present

## 2018-12-21 MED ORDER — ONDANSETRON 4 MG PO TBDP: 4.0000 mg | ORAL_TABLET | Freq: Three times a day (TID) | ORAL | 0 refills | Status: DC | PRN

## 2018-12-21 NOTE — Progress Notes (Signed)
Subjective:     Patient ID: Rebecca Day , female    DOB: Jan 04, 1986 , 33 y.o.   MRN: [id number]   Chief Complaint  Patient presents with  . MRI F/U    HPI  Follow up from MRI   Has scheduled colonoscopy on 01/23/2019 - due to family history and wants to rule out IBS due to her bowel pattern.    She had an appt with Rheumatology but has been pushed back to August.    Abdominal Pain  This is a recurrent problem. The current episode started more than 1 year ago. Pertinent negatives include no headaches.     Past Medical History:  Diagnosis Date  . Asthma   . Elevated blood sugar   . Kidney stones   . Lupus ([place])    questionable, no meds or treatment per pt  . 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[place])   . Ovarian cyst      Family History  Problem Relation Age of Onset  . Hypertension Mother   . Cancer Father   . Kidney disease Brother   . Liver disease Brother   . Hyperlipidemia Maternal Grandmother      Current Outpatient Medications:  .  acetaminophen (TYLENOL) 500 MG tablet, Take 1,000 mg by mouth every 6 (six) hours as needed for moderate pain., Disp: , Rfl:  .  buPROPion (WELLBUTRIN XL) 150 MG 24 hr tablet, Take 1 tablet (150 mg total) by mouth every morning., Disp: 30 tablet, Rfl: 2 .  dicyclomine (BENTYL) 20 MG tablet, Take 1 tablet (20 mg total) by mouth 4 (four) times daily -  before meals and at bedtime., Disp: 120 tablet, Rfl: 3 .  hydroxychloroquine (PLAQUENIL) 200 MG tablet, Take 1 tablet (200 mg total) by mouth 2 (two) times daily., Disp: 60 tablet, Rfl: 2 .  ibuprofen (ADVIL,MOTRIN) 400 MG tablet, Take 1 tablet (400 mg total) by mouth every 6 (six) hours as needed., Disp: 30 tablet, Rfl: 0 .  ondansetron (ZOFRAN-ODT) 4 MG disintegrating tablet, Take 1 tablet (4 mg total) by mouth every 8 (eight) hours as needed for nausea or vomiting., Disp: 20 tablet, Rfl: 0   Allergies  Allergen Reactions  . Penicillins Hives    Has patient had a PCN reaction causing immediate rash,  facial/tongue/throat swelling, SOB or lightheadedness with hypotension: Yes Has patient had a PCN reaction causing severe rash involving mucus membranes or skin necrosis: No Has patient had a PCN reaction that required hospitalization: Yes Has patient had a PCN reaction occurring within the last 10 years: NO If all of the above answers are "NO", then may proceed with Cephalosporin use.     Review of Systems  Constitutional: Negative.   Respiratory: Negative.   Cardiovascular: Negative.  Negative for chest pain, palpitations and leg swelling.  Gastrointestinal: Positive for abdominal pain.  Neurological: Negative for dizziness and headaches.     Today's Vitals   12/21/18 0938  BP: 128/86  Pulse: 94  Temp: 97.8 F (36.6 C)  TempSrc: Oral  Weight: 296 lb 3.2 oz (134.4 kg)  Height: 5\' 8"  (1.727 m)  PainSc: 4   PainLoc: Back   Body mass index is 45.04 kg/m.   Objective:  Physical Exam Constitutional:      Appearance: Normal appearance.  Cardiovascular:     Rate and Rhythm: Normal rate and regular rhythm.     Pulses: Normal pulses.     Heart sounds: Normal heart sounds. No murmur.  Pulmonary:     Effort:  Pulmonary effort is normal.     Breath sounds: Normal breath sounds.  Skin:    General: Skin is warm and dry.     Capillary Refill: Capillary refill takes less than 2 seconds.  Neurological:     General: No focal deficit present.     Mental Status: She is alert and oriented to person, place, and time.  Psychiatric:        Mood and Affect: Mood normal.        Behavior: Behavior normal.        Thought Content: Thought content normal.        Judgment: Judgment normal.         Assessment And Plan:     1. Left lower quadrant abdominal pain  Continues to have intermittent left abdominal pain her MRI is suggestive of a hemangioma  She is scheduled for colonoscopy in May  2. Nausea  Continues to have intermittent nausea  Will refill zofran and encouraged to take  a probiotic daily  Samples of restora given  - ondansetron (ZOFRAN-ODT) 4 MG disintegrating tablet; Take 1 tablet (4 mg total) by mouth every 8 (eight) hours as needed for nausea or vomiting.  Dispense: 20 tablet; Refill: 0   [name], FNP    THE PATIENT IS ENCOURAGED TO PRACTICE SOCIAL DISTANCING DUE TO THE COVID-19 PANDEMIC.  ENCOURAGED TO WEAR A MASK ANY TIME SHE IS OUT AND WITHIN 6 FEET ACCORDING TO THE RECOMMENDATIONS OF THE CDC.

## 2018-12-27 ENCOUNTER — Other Ambulatory Visit (INDEPENDENT_AMBULATORY_CARE_PROVIDER_SITE_OTHER): Payer: Medicaid Other

## 2018-12-27 DIAGNOSIS — R160 Hepatomegaly, not elsewhere classified: Secondary | ICD-10-CM | POA: Diagnosis not present

## 2018-12-27 DIAGNOSIS — R1012 Left upper quadrant pain: Secondary | ICD-10-CM

## 2018-12-27 DIAGNOSIS — Z800 Family history of malignant neoplasm of digestive organs: Secondary | ICD-10-CM

## 2018-12-27 LAB — COMPREHENSIVE METABOLIC PANEL
ALT: 17 U/L (ref 0–35)
AST: 17 U/L (ref 0–37)
Albumin: 4.1 g/dL (ref 3.5–5.2)
Alkaline Phosphatase: 87 U/L (ref 39–117)
BUN: 14 mg/dL (ref 6–23)
CO2: 23 mEq/L (ref 19–32)
Calcium: 9.2 mg/dL (ref 8.4–10.5)
Chloride: 106 mEq/L (ref 96–112)
Creatinine, Ser: 0.86 mg/dL (ref 0.40–1.20)
GFR: 93.49 mL/min (ref >60.00)
Glucose, Bld: 113 mg/dL — ABNORMAL HIGH (ref 70–99)
Potassium: 4.4 mEq/L (ref 3.5–5.1)
Sodium: 137 mEq/L (ref 135–145)
Total Bilirubin: 0.3 mg/dL (ref 0.2–1.2)
Total Protein: 7.3 g/dL (ref 6.0–8.3)

## 2018-12-27 LAB — TSH: TSH: 1.01 u[IU]/mL (ref 0.35–4.50)

## 2018-12-27 LAB — CBC WITH DIFFERENTIAL/PLATELET
Basophils Absolute: 0.1 10*3/uL (ref 0.0–0.1)
Basophils Relative: 1 % (ref 0.0–3.0)
Eosinophils Absolute: 0.1 10*3/uL (ref 0.0–0.7)
Eosinophils Relative: 1.4 % (ref 0.0–5.0)
HCT: 39.1 % (ref 36.0–46.0)
Hemoglobin: 13.4 g/dL (ref 12.0–15.0)
Lymphocytes Relative: 42 % (ref 12.0–46.0)
Lymphs Abs: 2.7 10*3/uL (ref 0.7–4.0)
MCHC: 34.2 g/dL (ref 30.0–36.0)
MCV: 87.7 fl (ref 78.0–100.0)
Monocytes Absolute: 0.3 10*3/uL (ref 0.1–1.0)
Monocytes Relative: 4.3 % (ref 3.0–12.0)
Neutro Abs: 3.3 10*3/uL (ref 1.4–7.7)
Neutrophils Relative %: 51.3 % (ref 43.0–77.0)
Platelets: 270 10*3/uL (ref 150.0–400.0)
RBC: 4.46 Mil/uL (ref 3.87–5.11)
RDW: 13 % (ref 11.5–15.5)
WBC: 6.5 10*3/uL (ref 4.0–10.5)

## 2018-12-27 LAB — SEDIMENTATION RATE: Sed Rate: 31 mm/hr — ABNORMAL HIGH (ref 0–20)

## 2018-12-27 NOTE — Addendum Note (Signed)
Addended by: Marzella Schlein on: 12/27/2018 12:27 PM   Modules accepted: Orders

## 2018-12-28 ENCOUNTER — Encounter: Payer: Self-pay | Admitting: Emergency Medicine

## 2019-01-02 ENCOUNTER — Encounter: Payer: Self-pay | Admitting: Nurse Practitioner

## 2019-01-10 ENCOUNTER — Other Ambulatory Visit: Payer: Self-pay | Admitting: Nurse Practitioner

## 2019-01-10 DIAGNOSIS — G8929 Other chronic pain: Secondary | ICD-10-CM

## 2019-01-10 DIAGNOSIS — M329 Systemic lupus erythematosus, unspecified: Secondary | ICD-10-CM

## 2019-01-15 ENCOUNTER — Other Ambulatory Visit: Payer: Medicaid Other

## 2019-01-15 DIAGNOSIS — R160 Hepatomegaly, not elsewhere classified: Secondary | ICD-10-CM

## 2019-01-15 DIAGNOSIS — R1012 Left upper quadrant pain: Secondary | ICD-10-CM

## 2019-01-15 DIAGNOSIS — Z800 Family history of malignant neoplasm of digestive organs: Secondary | ICD-10-CM

## 2019-01-16 LAB — GIARDIA ANTIGEN
MICRO NUMBER:: 483348
RESULT:: NOT DETECTED
SPECIMEN QUALITY:: ADEQUATE

## 2019-01-18 LAB — CALPROTECTIN, FECAL: Calprotectin, Fecal: <16 ug/g (ref 0–120)

## 2019-01-23 LAB — HM COLONOSCOPY

## 2019-01-31 ENCOUNTER — Encounter: Payer: Self-pay | Admitting: Nurse Practitioner

## 2019-03-06 ENCOUNTER — Other Ambulatory Visit: Payer: Self-pay

## 2019-03-06 ENCOUNTER — Emergency Department (HOSPITAL_BASED_OUTPATIENT_CLINIC_OR_DEPARTMENT_OTHER): Payer: Medicaid Other

## 2019-03-06 ENCOUNTER — Encounter (HOSPITAL_BASED_OUTPATIENT_CLINIC_OR_DEPARTMENT_OTHER): Payer: Self-pay

## 2019-03-06 ENCOUNTER — Emergency Department (HOSPITAL_BASED_OUTPATIENT_CLINIC_OR_DEPARTMENT_OTHER)
Admission: EM | Admit: 2019-03-06 | Discharge: 2019-03-06 | Disposition: A | Discharge status: Home / Self Care | Payer: Medicaid Other | Attending: Emergency Medicine | Admitting: Emergency Medicine

## 2019-03-06 DIAGNOSIS — N939 Abnormal uterine and vaginal bleeding, unspecified: Secondary | ICD-10-CM | POA: Diagnosis not present

## 2019-03-06 DIAGNOSIS — R102 Pelvic and perineal pain: Secondary | ICD-10-CM | POA: Diagnosis not present

## 2019-03-06 DIAGNOSIS — R1030 Lower abdominal pain, unspecified: Secondary | ICD-10-CM

## 2019-03-06 LAB — COMPREHENSIVE METABOLIC PANEL
ALT: 17 U/L (ref 0–44)
AST: 18 U/L (ref 15–41)
Albumin: 4 g/dL (ref 3.5–5.0)
Alkaline Phosphatase: 79 U/L (ref 38–126)
Anion gap: 8 (ref 5–15)
BUN: 9 mg/dL (ref 6–20)
CO2: 23 mmol/L (ref 22–32)
Calcium: 9.4 mg/dL (ref 8.9–10.3)
Chloride: 107 mmol/L (ref 98–111)
Creatinine, Ser: 0.73 mg/dL (ref 0.44–1.00)
GFR calc Af Amer: >60 mL/min (ref >60)
GFR calc non Af Amer: >60 mL/min (ref >60)
Glucose, Bld: 103 mg/dL — ABNORMAL HIGH (ref 70–99)
Potassium: 4.6 mmol/L (ref 3.5–5.1)
Sodium: 138 mmol/L (ref 135–145)
Total Bilirubin: 0.5 mg/dL (ref 0.3–1.2)
Total Protein: 7.9 g/dL (ref 6.5–8.1)

## 2019-03-06 LAB — URINALYSIS, ROUTINE W REFLEX MICROSCOPIC
Bilirubin Urine: NEGATIVE
Glucose, UA: NEGATIVE mg/dL
Ketones, ur: NEGATIVE mg/dL
Leukocytes,Ua: NEGATIVE
Nitrite: NEGATIVE
Protein, ur: NEGATIVE mg/dL
Specific Gravity, Urine: 1.025 (ref 1.005–1.030)
pH: 6 (ref 5.0–8.0)

## 2019-03-06 LAB — WET PREP, GENITAL
Sperm: NONE SEEN
Trich, Wet Prep: NONE SEEN
Yeast Wet Prep HPF POC: NONE SEEN

## 2019-03-06 LAB — URINALYSIS, MICROSCOPIC (REFLEX)

## 2019-03-06 LAB — CBC WITH DIFFERENTIAL/PLATELET
Abs Immature Granulocytes: 0.03 10*3/uL (ref 0.00–0.07)
Basophils Absolute: 0 10*3/uL (ref 0.0–0.1)
Basophils Relative: 1 %
Eosinophils Absolute: 0 10*3/uL (ref 0.0–0.5)
Eosinophils Relative: 0 %
HCT: 41.7 % (ref 36.0–46.0)
Hemoglobin: 13.6 g/dL (ref 12.0–15.0)
Immature Granulocytes: 1 %
Lymphocytes Relative: 27 %
Lymphs Abs: 1.6 10*3/uL (ref 0.7–4.0)
MCH: 29.4 pg (ref 26.0–34.0)
MCHC: 32.6 g/dL (ref 30.0–36.0)
MCV: 90.3 fL (ref 80.0–100.0)
Monocytes Absolute: 0.3 10*3/uL (ref 0.1–1.0)
Monocytes Relative: 5 %
Neutro Abs: 3.9 10*3/uL (ref 1.7–7.7)
Neutrophils Relative %: 66 %
Platelets: 314 10*3/uL (ref 150–400)
RBC: 4.62 MIL/uL (ref 3.87–5.11)
RDW: 12.4 % (ref 11.5–15.5)
WBC: 5.9 10*3/uL (ref 4.0–10.5)
nRBC: 0 % (ref 0.0–0.2)

## 2019-03-06 LAB — PREGNANCY, URINE: Preg Test, Ur: NEGATIVE

## 2019-03-06 MED ORDER — SODIUM CHLORIDE 0.9 % IV BOLUS
1000.0000 mL | Freq: Once | INTRAVENOUS | Status: AC
  Administered 2019-03-06: 1000 mL via INTRAVENOUS

## 2019-03-06 MED ORDER — KETOROLAC TROMETHAMINE 30 MG/ML IJ SOLN
30.0000 mg | Freq: Once | INTRAMUSCULAR | Status: AC
  Administered 2019-03-06: 17:00:00 30 mg via INTRAVENOUS
  Filled 2019-03-06: qty 1

## 2019-03-06 MED ORDER — ACETAMINOPHEN 500 MG PO TABS
1000.0000 mg | ORAL_TABLET | Freq: Once | ORAL | Status: AC
  Administered 2019-03-06: 1000 mg via ORAL
  Filled 2019-03-06: qty 2

## 2019-03-06 MED ORDER — ONDANSETRON HCL 4 MG/2ML IJ SOLN
4.0000 mg | Freq: Once | INTRAMUSCULAR | Status: AC
  Administered 2019-03-06: 4 mg via INTRAVENOUS
  Filled 2019-03-06: qty 2

## 2019-03-06 NOTE — Discharge Instructions (Signed)
Your work-up today is reassuring, ultrasound looks good.  Please take ibuprofen 600 mg every 6 hours, Tylenol as needed for additional pain, you can also use warm compresses.  Please call to schedule follow-up with your OB/GYN.  Return for significantly worsened pain, fevers, vomiting, heavy bleeding, feeling lightheaded or any other new or concerning symptoms.

## 2019-03-06 NOTE — ED Triage Notes (Signed)
Pt c/o vaginal bleeding started yesterday-LMP 1-2 week ago-pt states she had a miscarriage in April and periods have been irregular since then-NAD-steady gait

## 2019-03-06 NOTE — ED Notes (Signed)
Pt. Has kept her husband notified

## 2019-03-06 NOTE — ED Provider Notes (Signed)
[facility] EMERGENCY DEPARTMENT Provider Note   CSN: [id number] Arrival date & time: 03/06/19  1255    History   Chief Complaint Chief Complaint  Patient presents with  . Vaginal Bleeding    HPI Rebecca Day is a 33 y.o. female.     Rebecca Day is a 33 y.o. female with a history of lupus, ovarian cyst, asthma, and multiple miscarriages, who presents to the emergency department today for evaluation of lower abdominal pain and vaginal bleeding.  She reports she had a miscarriage back in March/April, since then her periods have been irregular.  Her last regular menstrual period was 1 to 2 weeks ago about 3 days ago she began having some spotting and cramping lower abdominal pain which is worse on the left than the right.  She reports this morning she started having much heavier vaginal bleeding which is now starting to slow down again.  She reports she went through multiple tampons very quickly.  She reports pain is also worsened this morning.  She reports some nausea associated with pain but no vomiting.  No diarrhea or constipation.  No dysuria.  No vaginal discharge.  No fevers or chills.  No associated cough or shortness of breath.  No known sick contacts.  She and her family have been quarantining at home.  She is currently followed by Dr. Hulan Fray with OB/GYN, she has had multiple miscarriages and issues with fertility which Dr. Hulan Fray has recently started working with her on.     Past Medical History:  Diagnosis Date  . Asthma   . Elevated blood sugar   . Kidney stones   . Lupus ([place])    questionable, no meds or treatment per pt  . 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[place])   . Ovarian cyst     Patient Active Problem List   Diagnosis Date Noted  . Left lower quadrant abdominal pain 12/21/2018  . Nausea 12/21/2018  . History of blood clots 12/05/2018  . Muscle ache 12/05/2018  . Abnormal CT of liver 12/05/2018  . 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 Lupus ([place])  11/02/2018  . Flank pain 11/02/2018  . Depression 11/02/2018  . History of hematuria 11/02/2018  . Family history of spina bifida 07/06/2011    Past Surgical History:  Procedure Laterality Date  . CESAREAN SECTION     C/S x 2  . PILONIDAL CYST DRAINAGE       OB History    Gravida  9   Para  2   Term  2   Preterm  0   AB  6   Living  2     SAB  6   TAB  0   Ectopic  0   Multiple  0   Live Births  2            Home Medications    Prior to Admission medications   Medication Sig Start Date End Date Taking? Authorizing Provider  acetaminophen (TYLENOL) 500 MG tablet Take 1,000 mg by mouth every 6 (six) hours as needed for moderate pain.    [provider]  buPROPion (WELLBUTRIN XL) 150 MG 24 hr tablet Take 1 tablet (150 mg total) by mouth every morning. 11/03/18 11/03/19  [name], FNP  dicyclomine (BENTYL) 20 MG tablet Take 1 tablet (20 mg total) by mouth 4 (four) times daily -  before meals and at bedtime. 12/06/18   Thornton Park, MD  hydroxychloroquine (PLAQUENIL) 200 MG tablet Take 1 tablet (  200 mg total) by mouth 2 (two) times daily. 11/02/18 11/02/19  [name], FNP  ibuprofen (ADVIL,MOTRIN) 400 MG tablet Take 1 tablet (400 mg total) by mouth every 6 (six) hours as needed. 06/16/18   Varney Biles, MD  ondansetron (ZOFRAN-ODT) 4 MG disintegrating tablet Take 1 tablet (4 mg total) by mouth every 8 (eight) hours as needed for nausea or vomiting. 12/21/18   [name], FNP    Family History Family History  Problem Relation Age of Onset  . Hypertension Mother   . Cancer Father   . Kidney disease Brother   . Liver disease Brother   . Hyperlipidemia Maternal Grandmother     Social History Social History   Tobacco Use  . Smoking status: Never Smoker  . Smokeless tobacco: Never Used  Substance Use Topics  . Alcohol use: Yes    Comment: socially  . Drug use: No     Allergies   Penicillins   Review of Systems Review of  Systems  Constitutional: Negative for chills and fever.  HENT: Negative.   Respiratory: Negative for cough and shortness of breath.   Cardiovascular: Negative for chest pain.  Gastrointestinal: Positive for abdominal pain and nausea. Negative for blood in stool, constipation, diarrhea and vomiting.  Genitourinary: Positive for pelvic pain and vaginal bleeding. Negative for dysuria, flank pain, frequency, hematuria, vaginal discharge and vaginal pain.  Musculoskeletal: Negative for arthralgias and myalgias.  Skin: Negative for color change and rash.     Physical Exam Updated Vital Signs BP 127/86 (BP Location: Left Arm)   Pulse (!) 102   Temp 98.7 F (37.1 C) (Oral)   Resp 18   Ht 5\' 9"  (1.753 m)   Wt (!) 137.4 kg   LMP 07/29/2018   SpO2 100%   BMI 44.75 kg/m   Physical Exam Vitals signs and nursing note reviewed. Exam conducted with a chaperone present.  Constitutional:      General: She is not in acute distress.    Appearance: Normal appearance. She is well-developed. She is obese. She is not ill-appearing or diaphoretic.  HENT:     Head: Normocephalic and atraumatic.     Mouth/Throat:     Mouth: Mucous membranes are moist.     Pharynx: Oropharynx is clear.  Eyes:     General:        Right eye: No discharge.        Left eye: No discharge.     Conjunctiva/sclera: Conjunctivae normal.     Pupils: Pupils are equal, round, and reactive to light.  Neck:     Musculoskeletal: Neck supple.  Cardiovascular:     Rate and Rhythm: Normal rate and regular rhythm.     Heart sounds: Normal heart sounds. No murmur. No friction rub. No gallop.   Pulmonary:     Effort: Pulmonary effort is normal. No respiratory distress.     Breath sounds: Normal breath sounds. No wheezing or rales.     Comments: Respirations equal and unlabored, patient able to speak in full sentences, lungs clear to auscultation bilaterally Abdominal:     General: Bowel sounds are normal. There is no distension.      Palpations: Abdomen is soft. There is no mass.     Tenderness: There is abdominal tenderness. There is no guarding.     Comments: Abdomen is soft, bowel sounds present throughout.  There is tenderness across the lower abdomen most notably in the left lower quadrant but no guarding or peritoneal signs.  No CVA tenderness bilaterally.  Genitourinary:    Comments: Chaperone present during pelvic exam. No external genital lesions noted. Speculum exam with small amount of dark blood pooled in the vaginal vault but no active bleeding from the cervix, no clots noted.  No vaginal discharge. On bimanual exam patient has no cervical motion tenderness, no right adnexal tenderness but she is tender over the left adnexa. Musculoskeletal:        General: No deformity.  Skin:    General: Skin is warm and dry.     Capillary Refill: Capillary refill takes less than 2 seconds.  Neurological:     Mental Status: She is alert.     Coordination: Coordination normal.     Comments: Speech is clear, able to follow commands Moves extremities without ataxia, coordination intact   Psychiatric:        Mood and Affect: Mood normal.        Behavior: Behavior normal.      ED Treatments / Results  Labs (all labs ordered are listed, but only abnormal results are displayed) Labs Reviewed  WET PREP, GENITAL - Abnormal; Notable for the following components:      Result Value   Clue Cells Wet Prep HPF POC PRESENT (*)    WBC, Wet Prep HPF POC FEW (*)    All other components within normal limits  COMPREHENSIVE METABOLIC PANEL - Abnormal; Notable for the following components:   Glucose, Bld 103 (*)    All other components within normal limits  URINALYSIS, ROUTINE W REFLEX MICROSCOPIC - Abnormal; Notable for the following components:   APPearance CLOUDY (*)    Hgb urine dipstick LARGE (*)    All other components within normal limits  URINALYSIS, MICROSCOPIC (REFLEX) - Abnormal; Notable for the following  components:   Bacteria, UA RARE (*)    All other components within normal limits  CBC WITH DIFFERENTIAL/PLATELET  PREGNANCY, URINE  GC/CHLAMYDIA PROBE AMP ([place]) NOT AT [facility]    EKG None  Radiology US Transvaginal Non-ob  Result Date: 03/06/2019 CLINICAL DATA:  Left lower quadrant pain with vagina EXAM: TRANSABDOMINAL AND TRANSVAGINAL ULTRASOUND OF PELVIS DOPPLER ULTRASOUND OF OVARIES TECHNIQUE: Both transabdominal and transvaginal ultrasound examinations of the pelvis were performed. Transabdominal technique was performed for global imaging of the pelvis including uterus, ovaries, adnexal regions, and pelvic cul-de-sac. It was necessary to proceed with endovaginal exam following the transabdominal exam to visualize the endometrium and ovaries. Color and duplex Doppler ultrasound was utilized to evaluate blood flow to the ovaries. COMPARISON:  None. FINDINGS: Uterus Measurements: 9.7 x 5.7 x 6.6 cm. No fibroids or other mass visualized. Endometrium Thickness: 7.4 mm.  No focal abnormality visualized. Right ovary Measurements: 2.5 x 3.1 x 2.3 cm = volume: 9.2 mL. Normal appearance/no adnexal mass. Left ovary Measurements: 3.2 x 2.3 x 2.4 cm = volume: 8.8 mL. Normal appearance/no adnexal mass. Pulsed Doppler evaluation of both ovaries demonstrates normal low-resistance arterial and venous waveforms. Other findings Trace pelvic free fluid. IMPRESSION: 1. No evidence of ovarian torsion. 2. No focal sonographic findings to explain the patient's symptoms. Electronically Signed   By: Kathreen Devoid   On: 03/06/2019 16:14   US Pelvis Complete  Result Date: 03/06/2019 CLINICAL DATA:  Left lower quadrant pain with vagina EXAM: TRANSABDOMINAL AND TRANSVAGINAL ULTRASOUND OF PELVIS DOPPLER ULTRASOUND OF OVARIES TECHNIQUE: Both transabdominal and transvaginal ultrasound examinations of the pelvis were performed. Transabdominal technique was performed for global imaging of the pelvis including uterus, ovaries,  adnexal regions, and pelvic cul-de-sac. It was necessary to proceed with endovaginal exam following the transabdominal exam to visualize the endometrium and ovaries. Color and duplex Doppler ultrasound was utilized to evaluate blood flow to the ovaries. COMPARISON:  None. FINDINGS: Uterus Measurements: 9.7 x 5.7 x 6.6 cm. No fibroids or other mass visualized. Endometrium Thickness: 7.4 mm.  No focal abnormality visualized. Right ovary Measurements: 2.5 x 3.1 x 2.3 cm = volume: 9.2 mL. Normal appearance/no adnexal mass. Left ovary Measurements: 3.2 x 2.3 x 2.4 cm = volume: 8.8 mL. Normal appearance/no adnexal mass. Pulsed Doppler evaluation of both ovaries demonstrates normal low-resistance arterial and venous waveforms. Other findings Trace pelvic free fluid. IMPRESSION: 1. No evidence of ovarian torsion. 2. No focal sonographic findings to explain the patient's symptoms. Electronically Signed   By: Kathreen Devoid   On: 03/06/2019 16:14   [place] Art/ven Flow Abd Pelv Doppler  Result Date: 03/06/2019 CLINICAL DATA:  Left lower quadrant pain with vagina EXAM: TRANSABDOMINAL AND TRANSVAGINAL ULTRASOUND OF PELVIS DOPPLER ULTRASOUND OF OVARIES TECHNIQUE: Both transabdominal and transvaginal ultrasound examinations of the pelvis were performed. Transabdominal technique was performed for global imaging of the pelvis including uterus, ovaries, adnexal regions, and pelvic cul-de-sac. It was necessary to proceed with endovaginal exam following the transabdominal exam to visualize the endometrium and ovaries. Color and duplex Doppler ultrasound was utilized to evaluate blood flow to the ovaries. COMPARISON:  None. FINDINGS: Uterus Measurements: 9.7 x 5.7 x 6.6 cm. No fibroids or other mass visualized. Endometrium Thickness: 7.4 mm.  No focal abnormality visualized. Right ovary Measurements: 2.5 x 3.1 x 2.3 cm = volume: 9.2 mL. Normal appearance/no adnexal mass. Left ovary Measurements: 3.2 x 2.3 x 2.4 cm = volume: 8.8 mL. Normal  appearance/no adnexal mass. Pulsed Doppler evaluation of both ovaries demonstrates normal low-resistance arterial and venous waveforms. Other findings Trace pelvic free fluid. IMPRESSION: 1. No evidence of ovarian torsion. 2. No focal sonographic findings to explain the patient's symptoms. Electronically Signed   By: Kathreen Devoid   On: 03/06/2019 16:14    Procedures Procedures (including critical care time)  Medications Ordered in ED Medications  sodium chloride 0.9 % bolus 1,000 mL (0 mLs Intravenous Stopped 03/06/19 1639)  ondansetron (ZOFRAN) injection 4 mg (4 mg Intravenous Given 03/06/19 1426)  acetaminophen (TYLENOL) tablet 1,000 mg (1,000 mg Oral Given 03/06/19 1425)  ketorolac (TORADOL) 30 MG/ML injection 30 mg (30 mg Intravenous Given 03/06/19 1713)     Initial Impression / Assessment and Plan / ED Course  I have reviewed the triage vital signs and the nursing notes.  Pertinent labs & imaging results that were available during my care of the patient were reviewed by me and considered in my medical decision making (see chart for details).  Patient presents for evaluation of lower abdominal pain and vaginal bleeding.  Symptoms started about 3 days ago.  Pain is directly related to onset of bleeding and I suspect pelvic etiology.  History of multiple miscarriages, most recently in April and menstrual cycle has been irregular since.  Heavy bleeding began this morning, but has since slowed.  On exam she has a small amount of blood pooled in the vaginal vault but no active bleeding.  She has mild tenderness in the left lower quadrant but no peritoneal signs.  I doubt intra-abdominal cause for patient's pain.  Will check labs and pelvic ultrasound.  Pregnancy test is negative.  Labs overall reassuring with no leukocytosis and normal hemoglobin despite bleeding, no acute  electrolyte derangements, normal renal and liver function.  Wet prep with few clue cells present and few WBCs but patient does not  have any discharge noted on exam, not having vaginal discomfort or itching do not feel she needs treatment for BV at this time.  Urinalysis shows blood but no signs of infection.  Pelvic ultrasound very reassuring with no evidence of ovarian cysts, or ovarian torsion and no other acute findings.  Discussed reassuring results with patient, pain improved with Toradol.  Will have patient follow-up with her OB/GYN, I have encouraged regular NSAIDs with Tylenol as needed.  Return precautions discussed.  Patient expresses understanding and agreement with plan.  Discharged home in good condition.  Final Clinical Impressions(s) / ED Diagnoses   Final diagnoses:  Vaginal bleeding  Lower abdominal pain    ED Discharge Orders    None       Jacqlyn Larsen, [place] 03/06/19 1842    Little, Wenda Overland, MD 03/08/19 1420

## 2019-03-07 LAB — GC/CHLAMYDIA PROBE AMP (~~LOC~~) NOT AT ARMC
Chlamydia: NEGATIVE
Neisseria Gonorrhea: NEGATIVE

## 2019-03-14 ENCOUNTER — Encounter: Payer: Self-pay | Admitting: Gastroenterology

## 2019-03-14 ENCOUNTER — Encounter: Payer: Self-pay | Admitting: Nurse Practitioner

## 2019-03-29 ENCOUNTER — Ambulatory Visit: Payer: Medicaid Other | Admitting: Obstetrics & Gynecology

## 2019-03-29 ENCOUNTER — Other Ambulatory Visit: Payer: Self-pay

## 2019-03-29 ENCOUNTER — Ambulatory Visit (HOSPITAL_COMMUNITY)
Admission: EM | Admit: 2019-03-29 | Discharge: 2019-03-29 | Disposition: A | Discharge status: Home / Self Care | Payer: Medicaid Other | Attending: Family Medicine | Admitting: Family Medicine

## 2019-03-29 ENCOUNTER — Telehealth: Payer: Medicaid Other

## 2019-03-29 ENCOUNTER — Encounter

## 2019-03-29 DIAGNOSIS — M329 Systemic lupus erythematosus, unspecified: Secondary | ICD-10-CM | POA: Diagnosis not present

## 2019-03-29 MED ORDER — METHYLPREDNISOLONE SODIUM SUCC 125 MG IJ SOLR
INTRAMUSCULAR | Status: AC
  Filled 2019-03-29: qty 2

## 2019-03-29 MED ORDER — METHYLPREDNISOLONE SODIUM SUCC 125 MG IJ SOLR
80.0000 mg | Freq: Once | INTRAMUSCULAR | Status: AC
  Administered 2019-03-29: 80 mg via INTRAMUSCULAR

## 2019-03-29 MED ORDER — IBUPROFEN 800 MG PO TABS: 800.0000 mg | ORAL_TABLET | Freq: Three times a day (TID) | ORAL | 0 refills | Status: DC

## 2019-03-29 MED ORDER — PREDNISONE 20 MG PO TABS: ORAL_TABLET | ORAL | 0 refills | Status: DC

## 2019-03-29 NOTE — Discharge Instructions (Signed)
Home to rest Take prednisone as directed. Take ibuprofen as needed for pain. Call your PCP for follow-up.  I will send a copy of this record

## 2019-03-29 NOTE — ED Triage Notes (Signed)
Pt has not been able to get in touch with her PCP.  Pt complains of leg muscle and joint pain and sores on her back.  She states she has been having a flare up for about two weeks.  Pt states the sores on her back are burning and very painful.

## 2019-03-29 NOTE — ED Provider Notes (Signed)
[facility]    CSN: [id number] Arrival date & time: 03/29/19  1155      History   Chief Complaint Chief Complaint  Patient presents with  . Joint Pain    HPI Rebecca Day is a 33 y.o. female.   HPI  Patient states she has known lupus.  She currently has lupus flare.  Pain in her joints.  Pain in her muscles.  Today the pain was intolerable.  She is here crying.  She has been unable to reach her PCP.  She states she is always treated with a few days of prednisone and does well.  No recent infection or fever.  Past Medical History:  Diagnosis Date  . Asthma   . Elevated blood sugar   . Kidney stones   . Lupus ([place])    questionable, no meds or treatment per pt  . 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[place])   . Ovarian cyst     Patient Active Problem List   Diagnosis Date Noted  . Left lower quadrant abdominal pain 12/21/2018  . Nausea 12/21/2018  . History of blood clots 12/05/2018  . Muscle ache 12/05/2018  . Abnormal CT of liver 12/05/2018  . 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 Lupus ([place]) 11/02/2018  . Flank pain 11/02/2018  . Depression 11/02/2018  . History of hematuria 11/02/2018  . Family history of spina bifida 07/06/2011    Past Surgical History:  Procedure Laterality Date  . CESAREAN SECTION     C/S x 2  . PILONIDAL CYST DRAINAGE      OB History    Gravida  9   Para  2   Term  2   Preterm  0   AB  6   Living  2     SAB  6   TAB  0   Ectopic  0   Multiple  0   Live Births  2            Home Medications    Prior to Admission medications   Medication Sig Start Date End Date Taking? Authorizing Provider  acetaminophen (TYLENOL) 500 MG tablet Take 1,000 mg by mouth every 6 (six) hours as needed for moderate pain.   Yes [provider]  buPROPion (WELLBUTRIN XL) 150 MG 24 hr tablet Take 1 tablet (150 mg total) by mouth every morning. 11/03/18 11/03/19 Yes [name], FNP  dicyclomine (BENTYL) 20 MG tablet Take 1  tablet (20 mg total) by mouth 4 (four) times daily -  before meals and at bedtime. 12/06/18  Yes Thornton Park, MD  hydroxychloroquine (PLAQUENIL) 200 MG tablet Take 1 tablet (200 mg total) by mouth 2 (two) times daily. 11/02/18 11/02/19 Yes [name], FNP  ibuprofen (ADVIL) 800 MG tablet Take 1 tablet (800 mg total) by mouth 3 (three) times daily. 03/29/19   Raylene Everts, MD  predniSONE (DELTASONE) 20 MG tablet Take 3 tabs today and tomorrow, take one tab 2 X a day for 5 days then one tab a day for 5 days.  Take with food 03/29/19   Raylene Everts, MD    Family History Family History  Problem Relation Age of Onset  . Hypertension Mother   . Cancer Father   . Kidney disease Brother   . Liver disease Brother   . Hyperlipidemia Maternal Grandmother     Social History Social History   Tobacco Use  . Smoking status: Never Smoker  . Smokeless tobacco: Never Used  Substance Use Topics  . Alcohol use: Yes    Comment: socially  . Drug use: No     Allergies   Penicillins   Review of Systems Review of Systems  Constitutional: Negative for chills and fever.  HENT: Negative for ear pain and sore throat.   Eyes: Negative for pain and visual disturbance.  Respiratory: Negative for cough and shortness of breath.   Cardiovascular: Negative for chest pain and palpitations.  Gastrointestinal: Negative for abdominal pain and vomiting.  Genitourinary: Negative for dysuria and hematuria.  Musculoskeletal: Positive for arthralgias, joint swelling and myalgias. Negative for back pain.  Skin: Negative for color change and rash.  Neurological: Negative for seizures and syncope.  All other systems reviewed and are negative.    Physical Exam Triage Vital Signs ED Triage Vitals  Enc Vitals Group     BP 03/29/19 1222 (!) 143/86     Pulse Rate 03/29/19 1222 (!) 101     Resp 03/29/19 1222 18     Temp 03/29/19 1222 98.1 F (36.7 C)     Temp Source 03/29/19 1222 Temporal     SpO2  03/29/19 1222 100 %     Weight --      Height --      Head Circumference --      Peak Flow --      Pain Score 03/29/19 1223 6     Pain Loc --      Pain Edu? --      Excl. in [place]? --    No data found.  Updated Vital Signs BP (!) 143/86 (BP Location: Left Arm)   Pulse (!) 101   Temp 98.1 F (36.7 C) (Temporal)   Resp 18   LMP 03/17/2019 (Exact Date)   SpO2 100%   Visual Acuity Right Eye Distance:   Left Eye Distance:   Bilateral Distance:    Right Eye Near:   Left Eye Near:    Bilateral Near:     Physical Exam Constitutional:      General: She is not in acute distress.    Appearance: She is well-developed. She is obese.     Comments: Tearful  HENT:     Head: Normocephalic and atraumatic.  Eyes:     Conjunctiva/sclera: Conjunctivae normal.     Pupils: Pupils are equal, round, and reactive to light.  Neck:     Musculoskeletal: Normal range of motion.  Cardiovascular:     Rate and Rhythm: Normal rate.  Pulmonary:     Effort: Pulmonary effort is normal. No respiratory distress.  Abdominal:     General: There is no distension.     Palpations: Abdomen is soft.  Musculoskeletal: Normal range of motion.     Comments: No palpable muscle tenderness.  No redness, warmth, or synovitis of joints noted.  Skin:    General: Skin is warm and dry.  Neurological:     General: No focal deficit present.     Mental Status: She is alert.      UC Treatments / Results  Labs (all labs ordered are listed, but only abnormal results are displayed) Labs Reviewed - No data to display  EKG   Radiology No results found.  Procedures Procedures (including critical care time)  Medications Ordered in UC Medications  methylPREDNISolone sodium succinate (SOLU-MEDROL) 125 mg/2 mL injection 80 mg (80 mg Intramuscular Given 03/29/19 1255)  methylPREDNISolone sodium succinate (SOLU-MEDROL) 125 mg/2 mL injection (has no administration in time range)  Initial Impression / Assessment  and Plan / UC Course  I have reviewed the triage vital signs and the nursing notes.  Pertinent labs & imaging results that were available during my care of the patient were reviewed by me and considered in my medical decision making (see chart for details).      Final Clinical Impressions(s) / UC Diagnoses   Final diagnoses:  Systemic lupus erythematosus, unspecified SLE type, unspecified organ involvement status [facility])     Discharge Instructions     Home to rest Take prednisone as directed. Take ibuprofen as needed for pain. Call your PCP for follow-up.  I will send a copy of this record   ED Prescriptions    Medication Sig Dispense Auth. Provider   ibuprofen (ADVIL) 800 MG tablet Take 1 tablet (800 mg total) by mouth 3 (three) times daily. 21 tablet Raylene Everts, MD   predniSONE (DELTASONE) 20 MG tablet Take 3 tabs today and tomorrow, take one tab 2 X a day for 5 days then one tab a day for 5 days.  Take with food 21 tablet Raylene Everts, MD     Controlled Substance Prescriptions [place] Controlled Substance Registry consulted? Not Applicable   Raylene Everts, MD 03/29/19 1321

## 2019-04-04 ENCOUNTER — Telehealth: Payer: Self-pay | Admitting: *Deleted

## 2019-04-04 NOTE — Telephone Encounter (Signed)
Patient called and rescheduled PV.

## 2019-04-04 NOTE — Telephone Encounter (Signed)
Patient no show PV today. Patient was called x2 with no answer, left messages for her to return my call to reschedule PV before 5 pm today.

## 2019-04-06 ENCOUNTER — Other Ambulatory Visit: Payer: Self-pay

## 2019-04-06 ENCOUNTER — Ambulatory Visit (AMBULATORY_SURGERY_CENTER): Payer: Self-pay | Admitting: *Deleted

## 2019-04-06 VITALS — Temp 96.2°F | Ht 69.0 in | Wt 300.0 lb

## 2019-04-06 DIAGNOSIS — R1012 Left upper quadrant pain: Secondary | ICD-10-CM

## 2019-04-06 DIAGNOSIS — Z800 Family history of malignant neoplasm of digestive organs: Secondary | ICD-10-CM

## 2019-04-06 MED ORDER — SUPREP BOWEL PREP KIT 17.5-3.13-1.6 GM/177ML PO SOLN: 1.0000 | Freq: Once | ORAL | 0 refills | Status: AC

## 2019-04-06 NOTE — Progress Notes (Signed)
No egg or soy allergy known to patient  No issues with past sedation with any surgeries  or procedures, no intubation problems  No diet pills per patient No home 02 use per patient  No blood thinners per patient  Pt denies issues with constipation  No A fib or A flutter  EMMI video sent to pt's e mail   In person PV today

## 2019-04-20 ENCOUNTER — Other Ambulatory Visit: Payer: Self-pay

## 2019-04-20 ENCOUNTER — Encounter: Payer: Medicaid Other | Admitting: Gastroenterology

## 2019-04-20 ENCOUNTER — Telehealth: Payer: Self-pay

## 2019-04-20 ENCOUNTER — Ambulatory Visit (AMBULATORY_SURGERY_CENTER): Payer: Medicaid Other | Admitting: Gastroenterology

## 2019-04-20 ENCOUNTER — Encounter: Payer: Self-pay | Admitting: Gastroenterology

## 2019-04-20 VITALS — BP 106/72 | HR 78 | Temp 98.5°F | Resp 17 | Ht 69.0 in | Wt 300.0 lb

## 2019-04-20 DIAGNOSIS — K635 Polyp of colon: Secondary | ICD-10-CM

## 2019-04-20 DIAGNOSIS — R1012 Left upper quadrant pain: Secondary | ICD-10-CM

## 2019-04-20 DIAGNOSIS — K3189 Other diseases of stomach and duodenum: Secondary | ICD-10-CM | POA: Diagnosis not present

## 2019-04-20 DIAGNOSIS — Z800 Family history of malignant neoplasm of digestive organs: Secondary | ICD-10-CM

## 2019-04-20 DIAGNOSIS — K2970 Gastritis, unspecified, without bleeding: Secondary | ICD-10-CM | POA: Diagnosis not present

## 2019-04-20 DIAGNOSIS — B9681 Helicobacter pylori [H. pylori] as the cause of diseases classified elsewhere: Secondary | ICD-10-CM

## 2019-04-20 DIAGNOSIS — D120 Benign neoplasm of cecum: Secondary | ICD-10-CM

## 2019-04-20 MED ORDER — PANTOPRAZOLE SODIUM 40 MG PO TBEC: 40.0000 mg | DELAYED_RELEASE_TABLET | Freq: Every day | ORAL | 0 refills | Status: DC

## 2019-04-20 MED ORDER — SODIUM CHLORIDE 0.9 % IV SOLN: 500.0000 mL | Freq: Once | INTRAVENOUS | Status: DC

## 2019-04-20 NOTE — Patient Instructions (Signed)
Your new medication is pantoprazole to reduce the acid in your stomach.  Take it once a day before a meal x 8 weeks.  Read all of the handouts given to you by your recovery room nurse.  Thank-you for choosing [place] for your healthcare needs today.  YOU HAD AN ENDOSCOPIC PROCEDURE TODAY AT [facility]:   Refer to the procedure report that was given to you for any specific questions about what was found during the examination.  If the procedure report does not answer your questions, please call your gastroenterologist to clarify.  If you requested that your care partner not be given the details of your procedure findings, then the procedure report has been included in a sealed envelope for you to review at your convenience later.  YOU SHOULD EXPECT: Some feelings of bloating in the abdomen. Passage of more gas than usual.  Walking can help get rid of the air that was put into your GI tract during the procedure and reduce the bloating. If you had a lower endoscopy (such as a colonoscopy or flexible sigmoidoscopy) you may notice spotting of blood in your stool or on the toilet paper. If you underwent a bowel prep for your procedure, you may not have a normal bowel movement for a few days.  Please Note:  You might notice some irritation and congestion in your nose or some drainage.  This is from the oxygen used during your procedure.  There is no need for concern and it should clear up in a day or so.  SYMPTOMS TO REPORT IMMEDIATELY:   Following lower endoscopy (colonoscopy or flexible sigmoidoscopy):  Excessive amounts of blood in the stool  Significant tenderness or worsening of abdominal pains  Swelling of the abdomen that is new, acute  Fever of 100F or higher   Following upper endoscopy (EGD)  Vomiting of blood or coffee ground material  New chest pain or pain under the shoulder blades  Painful or persistently difficult swallowing  New shortness of breath  Fever of 100F or  higher  Black, tarry-looking stools  For urgent or emergent issues, a gastroenterologist can be reached at any hour by calling [phone].   DIET:  We do recommend a small meal at first, but then you may proceed to your regular diet.  Drink plenty of fluids but you should avoid alcoholic beverages for 24 hours.  Try to eat a high fiber diet, and d rink plenty of water.  ACTIVITY:  You should plan to take it easy for the rest of today and you should NOT DRIVE or use heavy machinery until tomorrow (because of the sedation medicines used during the test).    FOLLOW UP: Our staff will call the number listed on your records 48-72 hours following your procedure to check on you and address any questions or concerns that you may have regarding the information given to you following your procedure. If we do not reach you, we will leave a message.  We will attempt to reach you two times.  During this call, we will ask if you have developed any symptoms of COVID 19. If you develop any symptoms (ie: fever, flu-like symptoms, shortness of breath, cough etc.) before then, please call [phone].  If you test positive for Covid 19 in the 2 weeks post procedure, please call and report this information to [place].    If any biopsies were taken you will be contacted by phone or by letter within the next  1-3 weeks.  Please call us at [phone] if you have not heard about the biopsies in 3 weeks.    SIGNATURES/CONFIDENTIALITY: You and/or your care partner have signed paperwork which will be entered into your electronic medical record.  These signatures attest to the fact that that the information above on your After Visit Summary has been reviewed and is understood.  Full responsibility of the confidentiality of this discharge information lies with you and/or your care-partner.

## 2019-04-20 NOTE — Progress Notes (Signed)
Temp per Denny Peon VS per Loma Sousa

## 2019-04-20 NOTE — Progress Notes (Signed)
Called to room to assist during endoscopic procedure.  Patient ID and intended procedure confirmed with present staff. Received instructions for my participation in the procedure from the performing physician.  

## 2019-04-20 NOTE — Op Note (Signed)
[facility] Patient Name: Rebecca Day Procedure Date: 04/20/2019 12:38 PM MRN: [id number] Endoscopist: Thornton Park MD, MD Age: 33 Referring MD:  Date of Birth: May 13, 1986 Gender: Female Account #: [id number] Procedure:                Upper GI endoscopy Indications:              Abdominal pain in the left upper quadrant Medicines:                See the Anesthesia note for documentation of the                            administered medications Procedure:                Pre-Anesthesia Assessment:                           - Prior to the procedure, a History and Physical                            was performed, and patient medications and                            allergies were reviewed. The patient's tolerance of                            previous anesthesia was also reviewed. The risks                            and benefits of the procedure and the sedation                            options and risks were discussed with the patient.                            All questions were answered, and informed consent                            was obtained. Prior Anticoagulants: The patient has                            taken no previous anticoagulant or antiplatelet                            agents. ASA Grade Assessment: III - A patient with                            severe systemic disease. After reviewing the risks                            and benefits, the patient was deemed in                            satisfactory condition to undergo the procedure.  After obtaining informed consent, the endoscope was                            passed under direct vision. Throughout the                            procedure, the patient's blood pressure, pulse, and                            oxygen saturations were monitored continuously. The                            Endoscope was introduced through the mouth, and                            advanced  to the third part of duodenum. The upper                            GI endoscopy was accomplished without difficulty.                            The patient tolerated the procedure well. Scope In: Scope Out: Findings:                 The examined esophagus was normal. Biopsies were                            taken with a cold forceps for histology from the                            mid/proximal and distal esophagus.                           Diffuse mildly erythematous mucosa without bleeding                            was found in the gastric antrum. Biopsies were                            taken from the antrum, fundus, and body with a cold                            forceps for histology. Estimated blood loss was                            minimal.                           The examined duodenum was normal. Biopsies were                            taken with a cold forceps for histology. Estimated  blood loss was minimal.                           The cardia and gastric fundus were normal on                            retroflexion. Complications:            No immediate complications. Estimated blood loss:                            Minimal. Estimated Blood Loss:     Estimated blood loss was minimal. Impression:               - Normal esophagus. Biopsied.                           - Erythematous mucosa in the antrum. Biopsied.                           - Normal examined duodenum. Biopsied.                           - Source of abdominal pain not identified on this                            study. Await biopsy results. Recommendation:           - Patient has a contact number available for                            emergencies. The signs and symptoms of potential                            delayed complications were discussed with the                            patient. Return to normal activities tomorrow.                            Written discharge  instructions were provided to the                            patient.                           - Resume previous diet today.                           - Start pantoprazole 40 mg daily x 8 weeks.                           - Continue present medications.                           - Await pathology results. Thornton Park MD, MD 04/20/2019 1:54:29 PM This report has been signed electronically.

## 2019-04-20 NOTE — Telephone Encounter (Signed)
Covid-19 screening questions   Do you now or have you had a fever in the last 14 days? NO  Do you have any respiratory symptoms of shortness of breath or cough now or in the last 14 days? NO  Do you have any family members or close contacts with diagnosed or suspected Covid-19 in the past 14 days? NO  Have you been tested for Covid-19 and found to be positive? NO     Confirmed with patient   

## 2019-04-20 NOTE — Progress Notes (Signed)
Pt's states no medical or surgical changes since previsit or office visit. 

## 2019-04-20 NOTE — Progress Notes (Signed)
Report to PACU, RN, vss, BBS= Clear.  

## 2019-04-20 NOTE — Op Note (Signed)
[facility] Patient Name: Rebecca Day Procedure Date: 04/20/2019 12:38 PM MRN: [id number] Endoscopist: Thornton Park MD, MD Age: 33 Referring MD:  Date of Birth: Jul 24, 1986 Gender: Female Account #: [id number] Procedure:                Colonoscopy Indications:              Abdominal pain in the left upper quadrant                           Family History of colon cancer (father in his 8s)                           Family history of colon polyps (mother,                            precancerous polyps)                           ESR 11/14/18 at 32LUQ Medicines:                See the Anesthesia note for documentation of the                            administered medications Procedure:                Pre-Anesthesia Assessment:                           - Prior to the procedure, a History and Physical                            was performed, and patient medications and                            allergies were reviewed. The patient's tolerance of                            previous anesthesia was also reviewed. The risks                            and benefits of the procedure and the sedation                            options and risks were discussed with the patient.                            All questions were answered, and informed consent                            was obtained. Prior Anticoagulants: The patient has                            taken no previous anticoagulant or antiplatelet  agents. ASA Grade Assessment: III - A patient with                            severe systemic disease. After reviewing the risks                            and benefits, the patient was deemed in                            satisfactory condition to undergo the procedure.                           After obtaining informed consent, the colonoscope                            was passed under direct vision. Throughout the   procedure, the patient's blood pressure, pulse, and                            oxygen saturations were monitored continuously. The                            Colonoscope was introduced through the anus and                            advanced to the the terminal ileum, with                            identification of the appendiceal orifice and IC                            valve. A second forward view of the right colon was                            performed. The colonoscopy was performed without                            difficulty. The patient tolerated the procedure                            well. The quality of the bowel preparation was                            excellent. The terminal ileum, ileocecal valve,                            appendiceal orifice, and rectum were photographed. Scope In: 1:34:26 PM Scope Out: 1:46:04 PM Scope Withdrawal Time: 0 hours 9 minutes 29 seconds  Total Procedure Duration: 0 hours 11 minutes 38 seconds  Findings:                 The perianal and digital rectal examinations were  normal.                           A less than 1 mm polyp was found in the cecum. The                            polyp was sessile. The polyp was removed with a                            cold biopsy forceps. Resection and retrieval were                            complete. Estimated blood loss was minimal.                           The colon (entire examined portion) appeared normal.                           The terminal ileum appeared normal.                           The exam was otherwise without abnormality on                            direct and retroflexion views. Complications:            No immediate complications. Estimated blood loss:                            Minimal. Estimated Blood Loss:     Estimated blood loss was minimal. Impression:               - One less than 1 mm polyp in the cecum, removed                            with  a cold biopsy forceps. Resected and retrieved.                           - The entire examined colon is normal.                           - The examined portion of the ileum was normal.                           - The examination was otherwise normal on direct                            and retroflexion views.                           - Source of pain not identified on this study. Recommendation:           - Patient has a contact number available for  emergencies. The signs and symptoms of potential                            delayed complications were discussed with the                            patient. Return to normal activities tomorrow.                            Written discharge instructions were provided to the                            patient.                           - Resume previous diet today.                           - Continue present medications.                           - Await pathology results.                           - Repeat colonoscopy in 5 years for surveillance                            regardless of pathology results given the family                            history.                           - Follow-up encounter to review these results and                            make additional plans for evaluation of her                            symptoms. Thornton Park MD, MD 04/20/2019 1:58:00 PM This report has been signed electronically.

## 2019-04-24 ENCOUNTER — Encounter: Payer: Self-pay | Admitting: *Deleted

## 2019-04-24 ENCOUNTER — Telehealth: Payer: Self-pay | Admitting: *Deleted

## 2019-04-24 NOTE — Telephone Encounter (Signed)
  Follow up Call-  Call back number 04/20/2019  Post procedure Call Back phone  # [phone]  Permission to leave phone message Yes  Some recent data might be hidden     Patient questions:  Do you have a fever, pain , or abdominal swelling? No. Pain Score  0 *  Have you tolerated food without any problems? Yes.    Have you been able to return to your normal activities? Yes.    Do you have any questions about your discharge instructions: Diet   No. Medications  No. Follow up visit  No.  Do you have questions or concerns about your Care? No.  Actions: * If pain score is 4 or above: No action needed, pain <4.  1. Have you developed a fever since your procedure? no  2.   Have you had an respiratory symptoms (SOB or cough) since your procedure? no  3.   Have you tested positive for COVID 19 since your procedure no  4.   Have you had any family members/close contacts diagnosed with the COVID 19 since your procedure? no   If yes to any of these questions please route to Joylene John, RN and Alphonsa Gin, Therapist, sports.

## 2019-04-24 NOTE — Telephone Encounter (Signed)
First attempt, no answer and no VM. Option.

## 2019-04-26 ENCOUNTER — Other Ambulatory Visit: Payer: Self-pay | Admitting: *Deleted

## 2019-04-26 MED ORDER — CLARITHROMYCIN 500 MG PO TABS: 500.0000 mg | ORAL_TABLET | Freq: Two times a day (BID) | ORAL | 0 refills | Status: AC

## 2019-04-26 MED ORDER — METRONIDAZOLE 500 MG PO TABS: 500.0000 mg | ORAL_TABLET | Freq: Three times a day (TID) | ORAL | 0 refills | Status: AC

## 2019-05-03 ENCOUNTER — Other Ambulatory Visit: Payer: Self-pay

## 2019-05-03 ENCOUNTER — Ambulatory Visit: Payer: Medicaid Other | Admitting: Internal Medicine

## 2019-05-03 VITALS — BP 128/88 | HR 72 | Temp 98.6°F | Ht 69.0 in | Wt 300.4 lb

## 2019-05-03 DIAGNOSIS — R300 Dysuria: Secondary | ICD-10-CM

## 2019-05-03 DIAGNOSIS — R809 Proteinuria, unspecified: Secondary | ICD-10-CM | POA: Diagnosis not present

## 2019-05-03 DIAGNOSIS — R3915 Urgency of urination: Secondary | ICD-10-CM | POA: Diagnosis not present

## 2019-05-03 DIAGNOSIS — F339 Major depressive disorder, recurrent, unspecified: Secondary | ICD-10-CM

## 2019-05-03 DIAGNOSIS — R399 Unspecified symptoms and signs involving the genitourinary system: Secondary | ICD-10-CM

## 2019-05-03 DIAGNOSIS — R609 Edema, unspecified: Secondary | ICD-10-CM

## 2019-05-03 DIAGNOSIS — Z23 Encounter for immunization: Secondary | ICD-10-CM

## 2019-05-03 LAB — POCT URINALYSIS DIPSTICK
Glucose, UA: NEGATIVE
Leukocytes, UA: NEGATIVE
Nitrite, UA: NEGATIVE
Protein, UA: POSITIVE — AB
Spec Grav, UA: 1.025 (ref 1.010–1.025)
Urobilinogen, UA: 0.2 E.U./dL
pH, UA: 6 (ref 5.0–8.0)

## 2019-05-03 MED ORDER — NITROFURANTOIN MONOHYD MACRO 100 MG PO CAPS: 100.0000 mg | ORAL_CAPSULE | Freq: Two times a day (BID) | ORAL | 0 refills | Status: AC

## 2019-05-03 MED ORDER — BUPROPION HCL ER (XL) 150 MG PO TB24: 150.0000 mg | ORAL_TABLET | ORAL | 0 refills | Status: DC

## 2019-05-03 MED ORDER — CIPROFLOXACIN HCL 500 MG PO TABS: 500.0000 mg | ORAL_TABLET | Freq: Two times a day (BID) | ORAL | 0 refills | Status: AC

## 2019-05-03 NOTE — Patient Instructions (Signed)
Proteinuria Proteinuria is when there is too much protein in the urine. Proteins are important for building muscles and bones. Proteins are also needed to fight infections, help the blood to clot, and keep body fluids in balance. Proteinuria may be mild and temporary, or it may be an early sign of kidney disease. The kidneys make urine. Healthy kidneys also keep substances like proteins from leaving the blood and ending up in the urine. What are the causes? This condition may be caused by damage to the kidneys or by temporary causes such as fever or stress. Proteinuria may happen when the kidneys are not working well. Healthy kidneys have filters (glomeruli) that keep proteins out of the urine. Proteinuria may mean that the glomeruli are damaged. The main causes of this type of damage are:  Diabetes.  High blood pressure. Other causes of kidney damage can also cause proteinuria, such as:  Diseases of the immune system, such as lupus, rheumatoid arthritis, sarcoidosis, and Goodpasture syndrome.  Heart disease or heart failure.  Kidney infection.  Certain cancers, including kidney cancer, lymphoma, leukemia, and multiple myeloma.  Amyloidosis. This is a disease that causes abnormal proteins to build up in body tissues.  Reactions to certain medicines, such as NSAIDs.  Injuries or poisons (toxins).  High blood pressure that occurs during pregnancy (preeclampsia and eclampsia). Temporary proteinuria may result from conditions that put stress on the kidneys. These conditions usually do not cause kidney damage. They include:  Fever.  Exposure to cold or heat.  Emotional or physical stress.  Extreme exercise.  Standing for long periods of time. What increases the risk? You are more likely to develop this condition if you:  Have diabetes.  Have high blood pressure.  Have heart disease or heart failure.  Have an immune disease, cancer, or other disease that affects the kidneys.   Have a family history of kidney disease.  Are 31 years of age or older.  Are overweight.  Are of African American, American [place], Hispanic/Latino, or [place] descent.  Are pregnant.  Have an infection. What are the signs or symptoms? Mild proteinuria may not cause symptoms. As more proteins enter the urine, symptoms of kidney disease may develop, such as:  Foamy urine.  Swelling of the face, abdomen, hands, legs, or feet (edema).  Needing to urinate frequently.  Fatigue.  Difficulty sleeping.  Dry and itchy skin.  Nausea and vomiting.  Muscle cramps.  Shortness of breath. How is this diagnosed? This condition may be diagnosed with a urine test. You may have this test as part of a routine physical exam or because you have symptoms of kidney disease or risk factors for kidney disease. You may also have:  Blood tests to measure the level of a certain substance (creatinine) that increases with kidney disease.  Imaging tests of your kidney, such as a CT scan or an ultrasound, to look for signs of kidney damage. How is this treated? If your proteinuria is mild or temporary, treatment may not be needed for this condition. Your health care provider may show you how to monitor the level of protein in your urine at home. Identifying proteinuria early is important so that the cause of the condition can be treated. Treatment for this condition depends on the cause of your proteinuria. Treatment may include:  Making diet and lifestyle changes.  Getting blood pressure under control.  Getting blood sugar under control, if you have diabetes.  Managing any other medical conditions you have that  affect your kidneys.  Giving birth, if you are pregnant.  Avoiding medicines that damage your kidneys. In severe cases, kidney disease may need to be treated with medicines or dialysis. Follow these instructions at home: Activity  Return to your normal activities as told by  your health care provider. Ask your health care provider what activities are safe for you.  Ask your health care provider to recommend an exercise program. General instructions  Check your protein levels at home if directed by your health care provider.  Follow instructions from your health care provider about eating or drinking restrictions.  If you are overweight, ask your health care provider about diets that can help you get to a healthy weight.  Take over-the-counter and prescription medicines only as told by your health care provider.  Keep all follow-up visits as told by your health care provider. This is important. Contact a health care provider if:  You have new symptoms.  Your symptoms get worse or do not improve. Get help right away if you:  Have back pain.  Have diarrhea.  Vomit.  Have a fever.  Have a rash. Summary  Proteinuria is when there is too much protein in the urine.  Proteinuria may be mild and temporary, or it may be an early sign of kidney disease.  This condition may be diagnosed with a urine test.  Treatment for this condition depends on the cause of your proteinuria.  Treatment may include diet and lifestyle changes, blood pressure and blood sugar management, and avoiding medicines that may damage the kidneys. If the proteinuria is severe, it may need to be treated with medicines or dialysis. This information is not intended to replace advice given to you by your health care provider. Make sure you discuss any questions you have with your health care provider. Document Released: 10/06/2005 Document Revised: 04/03/2018 Document Reviewed: 04/03/2018 [employer] Patient Education  2020 [employer]  

## 2019-05-03 NOTE — Progress Notes (Signed)
Subjective:     Patient ID: Rebecca Day , female    DOB: [birth year] , 33 y.o.   MRN: [id number]   Chief Complaint  Patient presents with  . Urinary Tract Infection    painul urination, pain in lower back, pressure  . Leg Swelling    hands and ankles swelling and tingling by the end of day    3- she d/c her Wellbutrin and would like to get back on it.  HPI  1- bladder pressure and L flank pain, has had urgency and has felt this when she had renal stones. Sometimes she only has UTI.  Denies abnormal vaginal discharge.    2- has been having increase swelling of hands and feet x 3 weeks week. Thought it was related to her Lupus flair, but due to pain being gone, she still has had pitting on her shins. Her wt was 6 lb higher than the prior week.  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 She does not recall if she had side effects. Would like to be sent to therapist and psychiatrist for continuation of care. She has had a hard time finding someone to see.  Past Medical History:  Diagnosis Date  . Allergy   . Anxiety   . Asthma   . Blood transfusion without reported diagnosis    as child   . Clotting disorder ([place])    blood clot after last pregnancy   . Elevated blood sugar   . Kidney stones   . Lupus ([place])    questionable, no meds or treatment per pt  . 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[place])   . Ovarian cyst      Family History  Problem Relation Age of Onset  . Hypertension Mother   . Colon polyps Mother   . Cancer Father        dx'd in 30's  . Colon cancer Father   . Kidney disease Brother   . Liver disease Brother   . Cancer Brother   . Hyperlipidemia Maternal Grandmother   . Colon cancer Maternal Aunt   . Colon cancer Other   . Esophageal cancer Neg Hx   . Rectal cancer Neg Hx   . Stomach cancer Neg Hx      Current Outpatient Medications:  .  acetaminophen (TYLENOL) 500 MG tablet, Take 1,000 mg by mouth every 6 (six) hours as needed for  moderate pain., Disp: , Rfl:  .  buPROPion (WELLBUTRIN XL) 150 MG 24 hr tablet, Take 1 tablet (150 mg total) by mouth every morning., Disp: 30 tablet, Rfl: 2 .  clarithromycin (BIAXIN) 500 MG tablet, Take 1 tablet (500 mg total) by mouth 2 (two) times daily for 10 days., Disp: 20 tablet, Rfl: 0 .  dicyclomine (BENTYL) 20 MG tablet, Take 1 tablet (20 mg total) by mouth 4 (four) times daily -  before meals and at bedtime., Disp: 120 tablet, Rfl: 3 .  hydroxychloroquine (PLAQUENIL) 200 MG tablet, Take 1 tablet (200 mg total) by mouth 2 (two) times daily., Disp: 60 tablet, Rfl: 2 .  metroNIDAZOLE (FLAGYL) 500 MG tablet, Take 1 tablet (500 mg total) by mouth 3 (three) times daily for 10 days., Disp: 30 tablet, Rfl: 0 .  pantoprazole (PROTONIX) 40 MG tablet, Take 1 tablet (40 mg total) by mouth daily., Disp: 60 tablet, Rfl: 0 .  ibuprofen (ADVIL) 800 MG tablet, Take 1 tablet (800 mg total) by mouth 3 (three) times daily. (Patient  not taking: Reported on 05/03/2019), Disp: 21 tablet, Rfl: 0   Allergies  Allergen Reactions  . Penicillins Hives    Has patient had a PCN reaction causing immediate rash, facial/tongue/throat swelling, SOB or lightheadedness with hypotension: Yes Has patient had a PCN reaction causing severe rash involving mucus membranes or skin necrosis: No Has patient had a PCN reaction that required hospitalization: Yes Has patient had a PCN reaction occurring within the last 10 years: NO If all of the above answers are "NO", then may proceed with Cephalosporin use.     Review of Systems  Psychiatric/Behavioral:       + for depression, anxiety, lack of focusing, denies suicidal thoughts.    + for edema. Denies orthopnea, CP or SOB. Has + bladder pressure, L flank pain and urgency.  Has been having abdominal pain and diagnsed with H pyulori per endoscopy. She is on treatment x 1 week ago.  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   Today's Vitals   05/03/19 1140  BP: 128/88  Pulse: 72  Temp: 98.6 F (37 C)  TempSrc: Oral  Weight: (!) 300 lb 6.4 oz (136.3 kg)  Height: 5\' 9"  (1.753 m)   Body mass index is 44.36 kg/m.   Objective:  Physical Exam Eyes:     General: No scleral icterus. Cardiovascular:     Rate and Rhythm: Normal rate and regular rhythm.     Pulses: Normal pulses.     Heart sounds: Normal heart sounds. No murmur.     Comments: No edema Pulmonary:     Effort: Pulmonary effort is normal.     Breath sounds: Normal breath sounds.  Abdominal:     General: Bowel sounds are normal.     Palpations: Abdomen is soft.     Tenderness: There is no abdominal tenderness. There is left CVA tenderness. There is no right CVA tenderness.     Comments: Mild L CVA tenderness  Skin:    General: Skin is warm.     Findings: No rash.  Neurological:     Mental Status: She is oriented to person, place, and time.  Psychiatric:        Mood and Affect: Mood normal.        Behavior: Behavior normal.        Thought Content: Thought content normal.        Judgment: Judgment normal.     Assessment And Plan:  1. UTI symptoms - acute. Placed on Cipro - POCT Urinalysis Dipstick (81002) + for protein, rest neg.  - CBC no Diff - Culture, Urine- sent out.   2. Need for influenza vaccination - Flu Vaccine QUAD 6+ mos PF IM (Fluarix Quad PF)  3. Depression, recurrent ([place])- chronic - Ambulatory referral to Psychiatry - Ambulatory referral to Psychology - CBC no Diff    I went ahead and restarted her Wellbutrin for one month, til she gets in to see psychiatry 4. Proteinuria, unspecified type - CMP14 + Anion Gap    Needs repeated in 2 weeks with Fu with [name] 5. Edema, unspecified type- acute.  - CMP14 + Anion Gap - CBC no Diff  Rebecca Brummell RODRIGUEZ-SOUTHWORTH, PA-C    THE PATIENT IS ENCOURAGED TO PRACTICE SOCIAL DISTANCING DUE TO THE COVID-19 PANDEMIC.

## 2019-05-04 LAB — CMP14 + ANION GAP
ALT: 15 IU/L (ref 0–32)
AST: 20 IU/L (ref 0–40)
Albumin/Globulin Ratio: 1.8 (ref 1.2–2.2)
Albumin: 4.6 g/dL (ref 3.9–5.0)
Alkaline Phosphatase: 89 IU/L (ref 39–117)
Anion Gap: 14 mmol/L (ref 10.0–18.0)
BUN/Creatinine Ratio: 9 (ref 9–23)
BUN: 8 mg/dL (ref 6–20)
Bilirubin Total: 0.3 mg/dL (ref 0.0–1.2)
CO2: 22 mmol/L (ref 20–29)
Calcium: 10.1 mg/dL (ref 8.7–10.2)
Chloride: 105 mmol/L (ref 96–106)
Creatinine, Ser: 0.91 mg/dL (ref 0.57–1.00)
GFR calc Af Amer: 98 mL/min/{1.73_m2} (ref >59)
GFR calc non Af Amer: 85 mL/min/{1.73_m2} (ref >59)
Globulin, Total: 2.6 g/dL (ref 1.5–4.5)
Glucose: 110 mg/dL — ABNORMAL HIGH (ref 65–99)
Potassium: 4.5 mmol/L (ref 3.5–5.2)
Sodium: 141 mmol/L (ref 134–144)
Total Protein: 7.2 g/dL (ref 6.0–8.5)

## 2019-05-04 LAB — CBC
Hematocrit: 41.8 % (ref 34.0–46.6)
Hemoglobin: 13.8 g/dL (ref 11.1–15.9)
MCH: 29.7 pg (ref 26.6–33.0)
MCHC: 33 g/dL (ref 31.5–35.7)
MCV: 90 fL (ref 79–97)
Platelets: 327 10*3/uL (ref 150–450)
RBC: 4.65 x10E6/uL (ref 3.77–5.28)
RDW: 13 % (ref 11.7–15.4)
WBC: 7 10*3/uL (ref 3.4–10.8)

## 2019-05-04 LAB — URINE CULTURE

## 2019-05-10 ENCOUNTER — Other Ambulatory Visit: Payer: Self-pay | Admitting: Internal Medicine

## 2019-05-10 DIAGNOSIS — R7309 Other abnormal glucose: Secondary | ICD-10-CM

## 2019-05-10 NOTE — Progress Notes (Signed)
HGBA1C ordered.

## 2019-05-22 ENCOUNTER — Other Ambulatory Visit: Payer: Self-pay

## 2019-05-22 ENCOUNTER — Ambulatory Visit (HOSPITAL_BASED_OUTPATIENT_CLINIC_OR_DEPARTMENT_OTHER): Admission: RE | Admit: 2019-05-22 | Payer: Medicaid Other | Source: Ambulatory Visit

## 2019-05-22 ENCOUNTER — Encounter (HOSPITAL_BASED_OUTPATIENT_CLINIC_OR_DEPARTMENT_OTHER): Payer: Self-pay | Admitting: Emergency Medicine

## 2019-05-22 ENCOUNTER — Emergency Department (HOSPITAL_BASED_OUTPATIENT_CLINIC_OR_DEPARTMENT_OTHER)
Admission: EM | Admit: 2019-05-22 | Discharge: 2019-05-22 | Disposition: A | Discharge status: Home / Self Care | Payer: Medicaid Other | Attending: Emergency Medicine | Admitting: Emergency Medicine

## 2019-05-22 DIAGNOSIS — J45909 Unspecified asthma, uncomplicated: Secondary | ICD-10-CM | POA: Insufficient documentation

## 2019-05-22 DIAGNOSIS — R030 Elevated blood-pressure reading, without diagnosis of hypertension: Secondary | ICD-10-CM | POA: Diagnosis not present

## 2019-05-22 DIAGNOSIS — R1011 Right upper quadrant pain: Secondary | ICD-10-CM | POA: Insufficient documentation

## 2019-05-22 DIAGNOSIS — Z79899 Other long term (current) drug therapy: Secondary | ICD-10-CM | POA: Insufficient documentation

## 2019-05-22 LAB — CBC WITH DIFFERENTIAL/PLATELET
Abs Immature Granulocytes: 0.01 10*3/uL (ref 0.00–0.07)
Basophils Absolute: 0 10*3/uL (ref 0.0–0.1)
Basophils Relative: 0 %
Eosinophils Absolute: 0.1 10*3/uL (ref 0.0–0.5)
Eosinophils Relative: 1 %
HCT: 40.2 % (ref 36.0–46.0)
Hemoglobin: 13.2 g/dL (ref 12.0–15.0)
Immature Granulocytes: 0 %
Lymphocytes Relative: 39 %
Lymphs Abs: 3.8 10*3/uL (ref 0.7–4.0)
MCH: 29.6 pg (ref 26.0–34.0)
MCHC: 32.8 g/dL (ref 30.0–36.0)
MCV: 90.1 fL (ref 80.0–100.0)
Monocytes Absolute: 0.8 10*3/uL (ref 0.1–1.0)
Monocytes Relative: 8 %
Neutro Abs: 4.9 10*3/uL (ref 1.7–7.7)
Neutrophils Relative %: 52 %
Platelets: 295 10*3/uL (ref 150–400)
RBC: 4.46 MIL/uL (ref 3.87–5.11)
RDW: 12.4 % (ref 11.5–15.5)
WBC: 9.7 10*3/uL (ref 4.0–10.5)
nRBC: 0 % (ref 0.0–0.2)

## 2019-05-22 LAB — COMPREHENSIVE METABOLIC PANEL
ALT: 20 U/L (ref 0–44)
AST: 19 U/L (ref 15–41)
Albumin: 3.9 g/dL (ref 3.5–5.0)
Alkaline Phosphatase: 87 U/L (ref 38–126)
Anion gap: 7 (ref 5–15)
BUN: 13 mg/dL (ref 6–20)
CO2: 21 mmol/L — ABNORMAL LOW (ref 22–32)
Calcium: 9.1 mg/dL (ref 8.9–10.3)
Chloride: 108 mmol/L (ref 98–111)
Creatinine, Ser: 0.78 mg/dL (ref 0.44–1.00)
GFR calc Af Amer: >60 mL/min (ref >60)
GFR calc non Af Amer: >60 mL/min (ref >60)
Glucose, Bld: 116 mg/dL — ABNORMAL HIGH (ref 70–99)
Potassium: 3.7 mmol/L (ref 3.5–5.1)
Sodium: 136 mmol/L (ref 135–145)
Total Bilirubin: 0.3 mg/dL (ref 0.3–1.2)
Total Protein: 7.5 g/dL (ref 6.5–8.1)

## 2019-05-22 LAB — LIPASE, BLOOD: Lipase: 20 U/L (ref 11–51)

## 2019-05-22 MED ORDER — ONDANSETRON 8 MG PO TBDP: 8.0000 mg | ORAL_TABLET | Freq: Three times a day (TID) | ORAL | 0 refills | Status: DC | PRN

## 2019-05-22 MED ORDER — ACETAMINOPHEN 325 MG PO TABS
650.0000 mg | ORAL_TABLET | Freq: Once | ORAL | Status: AC
  Administered 2019-05-22: 650 mg via ORAL
  Filled 2019-05-22: qty 2

## 2019-05-22 MED ORDER — ONDANSETRON 8 MG PO TBDP
8.0000 mg | ORAL_TABLET | Freq: Once | ORAL | Status: AC
  Administered 2019-05-22: 03:00:00 8 mg via ORAL
  Filled 2019-05-22: qty 1

## 2019-05-22 NOTE — ED Provider Notes (Signed)
[facility] EMERGENCY DEPARTMENT Provider Note   CSN: [id number] Arrival date & time: 05/22/19  0201    History   Chief Complaint Chief Complaint  Patient presents with  . Abdominal Pain    HPI Rebecca Day is a 33 y.o. female.   The history is provided by the patient.  Abdominal Pain She has history of lupus, DVT, liver hemangioma and comes in complaining of right upper quadrant pain for the last week.  Pain is worse after eating, especially after eating some greasy chicken.  There is associated nausea and vomiting.  Pain will last for about 45 minutes before resolving.  She has difficulty putting number on her pain, but currently pain is rated at 5/10.  There is no constipation or diarrhea.  She denies fever, chills, sweats.  She did have an endoscopy recently which showed H. pylori which was treated.  Past Medical History:  Diagnosis Date  . Allergy   . Anxiety   . Asthma   . Blood transfusion without reported diagnosis    as child   . Clotting disorder ([place])    blood clot after last pregnancy   . Elevated blood sugar   . Kidney stones   . Lupus ([place])    questionable, no meds or treatment per pt  . 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[place])   . Ovarian cyst     Patient Active Problem List   Diagnosis Date Noted  . Left lower quadrant abdominal pain 12/21/2018  . Nausea 12/21/2018  . History of blood clots 12/05/2018  . Muscle ache 12/05/2018  . Abnormal CT of liver 12/05/2018  . 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 Lupus ([place]) 11/02/2018  . Flank pain 11/02/2018  . Depression 11/02/2018  . History of hematuria 11/02/2018  . Family history of spina bifida 07/06/2011    Past Surgical History:  Procedure Laterality Date  . CESAREAN SECTION     C/S x 2  . PILONIDAL CYST DRAINAGE       OB History    Gravida  9   Para  2   Term  2   Preterm  0   AB  6   Living  2     SAB  6   TAB  0   Ectopic  0   Multiple  0   Live Births  2           Home Medications    Prior to Admission medications   Medication Sig Start Date End Date Taking? Authorizing Provider  acetaminophen (TYLENOL) 500 MG tablet Take 1,000 mg by mouth every 6 (six) hours as needed for moderate pain.    [provider]  buPROPion (WELLBUTRIN XL) 150 MG 24 hr tablet Take 1 tablet (150 mg total) by mouth every morning. 05/03/19 05/02/20  Rodriguez-Southworth, Sunday Spillers, PA-C  dicyclomine (BENTYL) 20 MG tablet Take 1 tablet (20 mg total) by mouth 4 (four) times daily -  before meals and at bedtime. 12/06/18   Thornton Park, MD  hydroxychloroquine (PLAQUENIL) 200 MG tablet Take 1 tablet (200 mg total) by mouth 2 (two) times daily. 11/02/18 11/02/19  [name], FNP  pantoprazole (PROTONIX) 40 MG tablet Take 1 tablet (40 mg total) by mouth daily. 04/20/19   Thornton Park, MD    Family History Family History  Problem Relation Age of Onset  . Hypertension Mother   . Colon polyps Mother   . Cancer Father        dx'd in 31's  .  Colon cancer Father   . Kidney disease Brother   . Liver disease Brother   . Cancer Brother   . Hyperlipidemia Maternal Grandmother   . Colon cancer Maternal Aunt   . Colon cancer Other   . Esophageal cancer Neg Hx   . Rectal cancer Neg Hx   . Stomach cancer Neg Hx     Social History Social History   Tobacco Use  . Smoking status: Never Smoker  . Smokeless tobacco: Never Used  Substance Use Topics  . Alcohol use: Yes    Comment: socially  . Drug use: No     Allergies   Penicillins   Review of Systems Review of Systems  Gastrointestinal: Positive for abdominal pain.  All other systems reviewed and are negative.    Physical Exam Updated Vital Signs BP (!) 141/99 (BP Location: Left Arm)   Pulse (!) 102   Temp 98.1 F (36.7 C) (Oral)   Resp 18   Ht 5\' 10"  (1.778 m)   Wt 135.6 kg   LMP 07/29/2018   SpO2 100%   BMI 42.90 kg/m   Physical Exam Vitals signs and nursing note reviewed.    33 year  old female, resting comfortably and in no acute distress. Vital signs are significant for mildly elevated heart rate and blood pressure. Oxygen saturation is 100%, which is normal. Head is normocephalic and atraumatic. PERRLA, EOMI. Oropharynx is clear. Neck is nontender and supple without adenopathy or JVD. Back is nontender and there is no CVA tenderness. Lungs are clear without rales, wheezes, or rhonchi. Chest is nontender. Heart has regular rate and rhythm without murmur. Abdomen is soft, flat, with mild to moderate right upper quadrant tenderness.  No definite Murphy sign.  There is no rebound or guarding.  There are no masses or hepatosplenomegaly and peristalsis is hypoactive. Extremities have no cyanosis or edema, full range of motion is present. Skin is warm and dry without rash. Neurologic: Mental status is normal, cranial nerves are intact, there are no motor or sensory deficits.  ED Treatments / Results  Labs (all labs ordered are listed, but only abnormal results are displayed) Labs Reviewed  COMPREHENSIVE METABOLIC PANEL - Abnormal; Notable for the following components:      Result Value   CO2 21 (*)    Glucose, Bld 116 (*)    All other components within normal limits  LIPASE, BLOOD  CBC WITH DIFFERENTIAL/PLATELET    Procedures Procedures   Medications Ordered in ED Medications  ondansetron (ZOFRAN-ODT) disintegrating tablet 8 mg (8 mg Oral Given 05/22/19 0232)  acetaminophen (TYLENOL) tablet 650 mg (650 mg Oral Given 05/22/19 0232)     Initial Impression / Assessment and Plan / ED Course  I have reviewed the triage vital signs and the nursing notes.  Pertinent labs & imaging results that were available during my care of the patient were reviewed by me and considered in my medical decision making (see chart for details).  Right upper quadrant pain which is suspicious for biliary tract disease.  Old records are reviewed, and endoscopy on August 21 showed some  erythema in the gastric antrum with biopsies showing H. pylori.  CT scan on March 15 showed a 1.4 cm liver lesion, and MRI on April 10 showed a 1.2 cm benign hemangioma, no cholelithiasis seen.  Will check screening labs and plan to bring patient back for right upper quadrant ultrasound in the morning.  In light of CT and MRI not showing cholelithiasis, ultrasound  will likely fail to show gallstones.  Anticipate she should need a HIDA scan, which can be ordered by her gastroenterologist.  Labs are unremarkable.  She is set up for return for right upper quadrant ultrasound, follow-up with gastroenterologist to consider HIDA scan if ultrasound is normal.  Final Clinical Impressions(s) / ED Diagnoses   Final diagnoses:  RUQ abdominal pain  Elevated blood pressure reading without diagnosis of hypertension    ED Discharge Orders         Ordered    US Abdomen Limited RUQ/Gall Bladder     05/22/19 0328    ondansetron (ZOFRAN-ODT) 8 MG disintegrating tablet  Every 8 hours PRN     05/22/19 [id number]           Delora Fuel, MD [id number] [phone]

## 2019-05-22 NOTE — Discharge Instructions (Addendum)
Return in the morning for an ultrasound to look for gallstones. If none are found, then you will need to see your gastroenterologist. She might choose to do a test called a HIDA scan, which looks for gallbladder problems not due to gallstones. If gallstones are found, you will need to see a surgeon.  Try to stay on a low fat diet.

## 2019-05-22 NOTE — ED Triage Notes (Signed)
Pt c/o upper abd pain x 1 week with vomiting after eating.

## 2019-05-24 ENCOUNTER — Ambulatory Visit (HOSPITAL_BASED_OUTPATIENT_CLINIC_OR_DEPARTMENT_OTHER)
Admission: RE | Admit: 2019-05-24 | Discharge: 2019-05-24 | Disposition: A | Discharge status: Home / Self Care | Payer: Medicaid Other | Source: Ambulatory Visit | Attending: Emergency Medicine | Admitting: Emergency Medicine

## 2019-05-24 ENCOUNTER — Other Ambulatory Visit: Payer: Self-pay | Admitting: Internal Medicine

## 2019-05-24 ENCOUNTER — Ambulatory Visit: Payer: Medicaid Other | Admitting: Internal Medicine

## 2019-05-24 ENCOUNTER — Telehealth: Payer: Self-pay

## 2019-05-24 ENCOUNTER — Other Ambulatory Visit: Payer: Self-pay

## 2019-05-24 ENCOUNTER — Encounter: Payer: Self-pay | Admitting: Internal Medicine

## 2019-05-24 VITALS — BP 128/86 | HR 75 | Temp 97.3°F | Ht 70.0 in | Wt 304.2 lb

## 2019-05-24 DIAGNOSIS — Z87898 Personal history of other specified conditions: Secondary | ICD-10-CM

## 2019-05-24 DIAGNOSIS — R1011 Right upper quadrant pain: Secondary | ICD-10-CM | POA: Diagnosis not present

## 2019-05-24 DIAGNOSIS — R635 Abnormal weight gain: Secondary | ICD-10-CM | POA: Diagnosis not present

## 2019-05-24 DIAGNOSIS — Z09 Encounter for follow-up examination after completed treatment for conditions other than malignant neoplasm: Secondary | ICD-10-CM | POA: Diagnosis not present

## 2019-05-24 DIAGNOSIS — R809 Proteinuria, unspecified: Secondary | ICD-10-CM

## 2019-05-24 DIAGNOSIS — R7309 Other abnormal glucose: Secondary | ICD-10-CM | POA: Diagnosis not present

## 2019-05-24 DIAGNOSIS — R609 Edema, unspecified: Secondary | ICD-10-CM

## 2019-05-24 LAB — POCT URINALYSIS DIPSTICK
Bilirubin, UA: NEGATIVE
Blood, UA: NEGATIVE
Glucose, UA: NEGATIVE
Ketones, UA: NEGATIVE
Leukocytes, UA: NEGATIVE
Nitrite, UA: NEGATIVE
Protein, UA: NEGATIVE
Spec Grav, UA: >=1.03 — AB (ref 1.010–1.025)
Urobilinogen, UA: 0.2 E.U./dL
pH, UA: 5.5 (ref 5.0–8.0)

## 2019-05-24 MED ORDER — TRIAMTERENE-HCTZ 37.5-25 MG PO TABS: ORAL_TABLET | ORAL | 0 refills | Status: DC

## 2019-05-24 NOTE — Patient Instructions (Signed)
Edema  Edema is when you have too much fluid in your body or under your skin. Edema may make your legs, feet, and ankles swell up. Swelling is also common in looser tissues, like around your eyes. This is a common condition. It gets more common as you get older. There are many possible causes of edema. Eating too much salt (sodium) and being on your feet or sitting for a long time can cause edema in your legs, feet, and ankles. Hot weather may make edema worse. Edema is usually painless. Your skin may look swollen or shiny. Follow these instructions at home:  Keep the swollen body part raised (elevated) above the level of your heart when you are sitting or lying down.  Do not sit still or stand for a long time.  Do not wear tight clothes. Do not wear garters on your upper legs.  Exercise your legs. This can help the swelling go down.  Wear elastic bandages or support stockings as told by your doctor.  Eat a low-salt (low-sodium) diet to reduce fluid as told by your doctor.  Depending on the cause of your swelling, you may need to limit how much fluid you drink (fluid restriction).  Take over-the-counter and prescription medicines only as told by your doctor. Contact a doctor if:  Treatment is not working.  You have heart, liver, or kidney disease and have symptoms of edema.  You have sudden and unexplained weight gain. Get help right away if:  You have shortness of breath or chest pain.  You cannot breathe when you lie down.  You have pain, redness, or warmth in the swollen areas.  You have heart, liver, or kidney disease and get edema all of a sudden.  You have a fever and your symptoms get worse all of a sudden. Summary  Edema is when you have too much fluid in your body or under your skin.  Edema may make your legs, feet, and ankles swell up. Swelling is also common in looser tissues, like around your eyes.  Raise (elevate) the swollen body part above the level of your  heart when you are sitting or lying down.  Follow your doctor's instructions about diet and how much fluid you can drink (fluid restriction). This information is not intended to replace advice given to you by your health care provider. Make sure you discuss any questions you have with your health care provider. Document Released: 02/02/2008 Document Revised: 08/19/2017 Document Reviewed: 09/03/2016 Elsevier Patient Education  2020 Elsevier Inc.  

## 2019-05-24 NOTE — Progress Notes (Unsigned)
.   Please have pt call Jonelle Sidle) directly M-TH 8:30-5 and F 8:30-noon at [phone] for scheduling. Thanks for the referral~stay safe! She was sent to psychiatry but pt never responded back the calls given to her.

## 2019-05-24 NOTE — Telephone Encounter (Signed)
Called pt to give message, pt said ok she would call   . Please have pt call Jonelle Sidle) directly M-TH 8:30-5 and F 8:30-noon at [phone] for scheduling. Thanks for the referral~stay safe! She was sent to psychiatry but pt never responded back the calls given to her.

## 2019-05-24 NOTE — Progress Notes (Signed)
Subjective:     Patient ID: Rebecca Day , female    DOB: 05/06/1986 , 33 y.o.   MRN: [id number]   Chief Complaint  Patient presents with  . Follow-up    proteinuria     HPI  Here for Fu proteinuria and edema. She is frustrated due to her weight gain.  Eats 2000 cal per day. Uses [employer] Walks 1.5 h per day at least 5 days a week.  Eats mostly fresh foods and occasional eat out.  Has been using pink salt.  Edema is a little better but still happening Has noticed her clothes a little loose.   Past Medical History:  Diagnosis Date  . Allergy   . Anxiety   . Asthma   . Blood transfusion without reported diagnosis    as child   . Clotting disorder ([place])    blood clot after last pregnancy   . Elevated blood sugar   . Kidney stones   . Lupus ([place])    questionable, no meds or treatment per pt  . 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[place])   . Ovarian cyst      Family History  Problem Relation Age of Onset  . Hypertension Mother   . Colon polyps Mother   . Cancer Father        dx'd in 40's  . Colon cancer Father   . Kidney disease Brother   . Liver disease Brother   . Cancer Brother   . Hyperlipidemia Maternal Grandmother   . Colon cancer Maternal Aunt   . Colon cancer Other   . Esophageal cancer Neg Hx   . Rectal cancer Neg Hx   . Stomach cancer Neg Hx      Current Outpatient Medications:  .  acetaminophen (TYLENOL) 500 MG tablet, Take 1,000 mg by mouth every 6 (six) hours as needed for moderate pain., Disp: , Rfl:  .  buPROPion (WELLBUTRIN XL) 150 MG 24 hr tablet, Take 1 tablet (150 mg total) by mouth every morning., Disp: 30 tablet, Rfl: 0 .  dicyclomine (BENTYL) 20 MG tablet, Take 1 tablet (20 mg total) by mouth 4 (four) times daily -  before meals and at bedtime., Disp: 120 tablet, Rfl: 3 .  hydroxychloroquine (PLAQUENIL) 200 MG tablet, Take 1 tablet (200 mg total) by mouth 2 (two) times daily., Disp: 60 tablet, Rfl: 2 .  ondansetron (ZOFRAN-ODT) 8 MG disintegrating  tablet, Take 1 tablet (8 mg total) by mouth every 8 (eight) hours as needed for nausea or vomiting., Disp: 20 tablet, Rfl: 0 .  pantoprazole (PROTONIX) 40 MG tablet, Take 1 tablet (40 mg total) by mouth daily., Disp: 60 tablet, Rfl: 0   Allergies  Allergen Reactions  . Penicillins Hives    Has patient had a PCN reaction causing immediate rash, facial/tongue/throat swelling, SOB or lightheadedness with hypotension: Yes Has patient had a PCN reaction causing severe rash involving mucus membranes or skin necrosis: No Has patient had a PCN reaction that required hospitalization: Yes Has patient had a PCN reaction occurring within the last 10 years: NO If all of the above answers are "NO", then may proceed with Cephalosporin use.        Review of Systems  Constitutional: Negative for diaphoresis and unexpected weight change.  HENT: Negative for tinnitus.   Eyes: Negative for visual disturbance.  Respiratory: Negative for chest tightness and shortness of breath.  Has  Cold 2 weeks ago and is better.  Cardiovascular: Negative for chest pain, palpitations and  leg swelling.  Gastrointestinal: Negative for constipation, diarrhea and nausea. Had abdominal pain and vomited green matter last week and has apt with Gi tomorrow.  Endocrine: + for weight gain  Genitourinary: Negative for dysuria and frequency.  Skin: Negative for rash and wound.  Neurological: Negative for dizziness, speech difficulty, weakness, numbness and headaches.   Today's Vitals   05/24/19 1008  BP: 128/86  Pulse: 75  Temp: (!) 97.3 F (36.3 C)  TempSrc: Oral  Weight: (!) 304 lb 3.2 oz (138 kg)  Height: 5\' 10"  (1.778 m)   Body mass index is 43.65 kg/m.   Objective:  Physical Exam  Wt is up 4 lbs    Constitutional: She is oriented to person, place, and time. She appears well-developed and well-nourished. No distress.  HENT:  Head: Normocephalic and atraumatic.  Right Ear: External ear normal.  Left Ear: External  ear normal.  Nose: Nose normal.  Eyes: Conjunctivae are normal. Right eye exhibits no discharge. Left eye exhibits no discharge. No scleral icterus.  Neck: Neck supple. No thyromegaly present.  No carotid bruits bilaterally  Cardiovascular: Normal rate and regular rhythm.  No murmur heard. Pulmonary/Chest: Effort normal and breath sounds normal. No respiratory distress.  Musculoskeletal: Normal range of motion. She exhibits no edema.  Lymphadenopathy:    She has no cervical adenopathy.  Neurological: She is alert and oriented to person, place, and time.  Skin: Skin is warm and dry. Capillary refill takes less than 2 seconds. No rash noted. She is not diaphoretic.  Psychiatric: She has a normal mood and affect. Her behavior is normal. Judgment and thought content normal.  Nursing note reviewed.  Assessment And Plan:     1. Proteinuria, unspecified type- resolved - POCT Urinalysis Dipstick (81002)- neg  2. Weight gain- could be muscle build up since her clothes feel little loose - TSH - Thyroid antibodies - Thyroid Peroxidase Antibody 3. Abnormal glucose- chronic. She never came to get HGBA1C early this month. - Insulin, random(561)  - Hemoglobin A1c  4. Class 3 severe obesity without serious comorbidity in adult, unspecified BMI, unspecified obesity type ([place])- advised to decrease her calories to 1800 cal per day and continue her exercise.  Fu in 1 month for wt check with [name].  5- Edema- improved but not resolved. I will have her try Maxide 37/25 1/2 per day prn edema.  Kainoah Bartosiewicz RODRIGUEZ-SOUTHWORTH, PA-C    THE PATIENT IS ENCOURAGED TO PRACTICE SOCIAL DISTANCING DUE TO THE COVID-19 PANDEMIC.

## 2019-05-24 NOTE — Progress Notes (Signed)
Referring Provider: [name], FNP Primary Care Physician:  [name], FNP  Chief complaint:  Liver Mass   IMPRESSION:  H pylori gastritis on EGD 04/20/19 with persistent nausea and vomiting after completing antibiotics    - amoxicillin, clarithromycin, and pantoprazole twice daily for 10 days Nausea, vomiting, postprandial bloating and distention that occurs within minutes of eating Reflux on endoscopy 04/20/2019 Focally increased intraepithelial lymphocytes on duodenal biopsies 04/20/19 Hepatic hemangioma    - 1.4 cm mass on CT scan    - MRI 12/08/18: Small segment 8 right liver lobe hemangioma No precancerous polyps on colonoscopy  04/20/19    - lymphoid aggregate removed Lupus Daughter with gluten sensitivity Family History of colon cancer (father in his 32s) Family history of colon polyps (mother, precancerous polyps) ESR 11/14/18 at 34   Abdominal pain has largely resolved with treatment of H. pylori.  However, she has intermittent nausea, vomiting, and postprandial bloating with distention that occurs within minutes of eating.  Duodenal biopsies last month were negative for celiac.  Esophageal biopsies were consistent with reflux and duodenal biopsies showed increased intraepithelial lymphocytes.  Therefore, I have increased the pantoprazole to 40 mg BID.  We will follow-up on H. pylori serologies to ensure eradication.  Will consider symptomatic gallbladder disease with ongoing symptoms.  MRI confirmed hemangioma. It is too small to be the cause of her symptoms. No further follow-up is needed at this time.   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  She returns in follow-up after initial consultation with me in April and subsequent endoscopic evaluation in August.  The interval history is obtained through the patient and review of her electronic health record.  Her PCP recently referred her to [facility].  She was also recently evaluated by Dr. Benson Norway.    Seen in consultation in April for a two year history of intense, constant left upper abdominal pain. Usually a dull ache but it can be hard, especially at night. Associated early satiety.  Eating may exacerbated her LUQ pain. Radiates down although not to one place in particular. Occassional left pelvic/groin pain. Can be tender to touch in the groin. Hard to lie on her left side due to exacerbation of the pain. Prevously thought it could be endometriosis.Has alternating diarrhea and constipation. Frequent mucous. Stools more often soft and mushy.  Some improvement with defecation but not complete relief.  Has kept a symptom journal and has been unable to identify specific triggers.   She had an EGD and colonoscopy performed 04/20/2019. The colon was normal except for a small possible polyp in the cecum that was histologically consistent with lymphoid aggregates.  No adenoma was identified.  The upper endoscopy revealed a normal esophagus but biopsies were consistent with reflux.  She had H. pylori gastritis.  Endoscopically the duodenum appeared normal but there were focally increased intraepithelial lymphocytes on biopsies.  It was recommended that she start pantoprazole 40 mg daily.  She was treated with amoxicillin, clarithromycin, and pantoprazole twice daily for 10 days after her pathology results  revealed H pylori. Continues on Protonix 40 mg daily.   Continues to have intermittent nausea and vomiting. She had several days were she vomited with any food. Symptoms are severe with fried foods.  Post-prandial bloating with distention that occurs without minutes of eating. She is wonders if it could be related to her lupus.  Her daughter has gluten sensitivity.   Seen in the ED yesterday. Had an ultrasound that showed no gallstones and no acute abnormality.   Abdominal imaging: CT abd/pelvis with contrast 11/12/18 for lower abdominal pain showed a 1.4 cm mass in the right lobe of the liver. Otherwise, no source identified for the abdominal pain.  MRI 12/08/18: Small segment 8 right liver lobe hemangioma. No suspicious liver masses. Subcentimeter simple left renal cyst. No suspicious renal masses. Abdominal ultrasound 05/24/19: no acute abnormality  Labs 11/12/18: normal CMP including AST 17, ALT 16, alk phos 74, TB 0.4 Labs 11/14/18: ESR 32  Past Medical History:  Diagnosis Date  . Allergy   . Anxiety   . Asthma   . Blood transfusion without reported diagnosis    as child   . Clotting disorder ([place])    blood clot after last pregnancy   . Elevated blood sugar   . Kidney stones   . Lupus ([place])    questionable, no meds or treatment per pt  . 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[place])   . Ovarian cyst     Past Surgical History:  Procedure Laterality Date  . CESAREAN SECTION     C/S x 2  . PILONIDAL CYST DRAINAGE      Current Outpatient Medications  Medication Sig Dispense Refill  . acetaminophen (TYLENOL) 500 MG tablet Take 1,000 mg by mouth every 6 (six) hours as needed for moderate pain.    Marland Kitchen buPROPion (WELLBUTRIN XL) 150 MG 24 hr tablet Take 1 tablet (150 mg total) by mouth every morning. 30 tablet 0  . dicyclomine (BENTYL) 20 MG tablet Take 1 tablet (20 mg total) by mouth 4 (four) times daily -  before meals and at bedtime. 120 tablet 3  . hydroxychloroquine (PLAQUENIL) 200 MG tablet Take 1 tablet (200 mg total) by mouth 2 (two) times daily. 60 tablet 2  . ondansetron (ZOFRAN-ODT) 8 MG disintegrating tablet Take 1 tablet (8 mg total) by mouth every 8 (eight) hours as needed for nausea or  vomiting. 20 tablet 0  . pantoprazole (PROTONIX) 40 MG tablet Take 1 tablet (40 mg total) by mouth daily. 60 tablet 0  . triamterene-hydrochlorothiazide (MAXZIDE-25) 37.5-25 MG tablet 1/2 a day prn edema 30 tablet 0   No current facility-administered medications for this visit.     Allergies as of 05/25/2019 - Review Complete 05/24/2019  Allergen Reaction Noted  . Penicillins Hives 07/06/2011    Family History  Problem Relation Age of Onset  . Hypertension Mother   . Colon polyps Mother   . Cancer Father        dx'd in 17's  . Colon cancer Father   . Kidney disease Brother   . Liver disease Brother   . Cancer Brother   . Hyperlipidemia Maternal Grandmother   . Colon cancer Maternal Aunt   . Colon cancer Other   . Esophageal cancer Neg Hx   . Rectal cancer Neg Hx   . Stomach cancer Neg Hx     Social History   Socioeconomic History  . Marital status: Single    Spouse name: Not on file  . Number of children: Not  on file  . Years of education: Not on file  . Highest education level: Not on file  Occupational History  . Not on file  Social Needs  . Financial resource strain: Not on file  . Food insecurity    Worry: Not on file    Inability: Not on file  . Transportation needs    Medical: Not on file    Non-medical: Not on file  Tobacco Use  . Smoking status: Never Smoker  . Smokeless tobacco: Never Used  Substance and Sexual Activity  . Alcohol use: Yes    Comment: socially  . Drug use: No  . Sexual activity: Not on file  Lifestyle  . Physical activity    Days per week: Not on file    Minutes per session: Not on file  . Stress: Not on file  Relationships  . Social Herbalist on phone: Not on file    Gets together: Not on file    Attends religious service: Not on file    Active member of club or organization: Not on file    Attends meetings of clubs or organizations: Not on file    Relationship status: Not on file  . Intimate partner violence     Fear of current or ex partner: Not on file    Emotionally abused: Not on file    Physically abused: Not on file    Forced sexual activity: Not on file  Other Topics Concern  . Not on file  Social History Narrative  . Not on file    Physical Exam: General:   Alert,  well-nourished, pleasant and cooperative in NAD.  Pressured speech Head:  Normocephalic and atraumatic. Eyes:  Sclera clear, no icterus.   Conjunctiva pink. Ears:  Normal auditory acuity. Nose:  No deformity, discharge,  or lesions. Mouth:  No deformity or lesions.   Neck:  Supple; no masses or thyromegaly. Lungs:  Clear throughout to auscultation.   No wheezes. Heart:  Regular rate and rhythm; no murmurs. Abdomen:  Soft,nontender, nondistended, normal bowel sounds, no rebound or guarding. No hepatosplenomegaly.  No obvious ascites.  No abdominal wall hernias.  No succession splash. Rectal:  Deferred  Msk:  Symmetrical. No boney deformities LAD: No inguinal or umbilical LAD Extremities:  No clubbing or edema. Neurologic:  Alert and  oriented x4;  grossly nonfocal Skin:  Intact without significant lesions or rashes. Psych:  Alert and cooperative. Normal mood and affect.  Tayvon Culley L. Tarri Glenn, MD, MPH [facility] 05/24/2019, 5:22 PM

## 2019-05-25 ENCOUNTER — Other Ambulatory Visit: Payer: Medicaid Other

## 2019-05-25 ENCOUNTER — Ambulatory Visit: Payer: Medicaid Other | Admitting: Gastroenterology

## 2019-05-25 ENCOUNTER — Encounter: Payer: Self-pay | Admitting: Gastroenterology

## 2019-05-25 VITALS — BP 124/82 | HR 88 | Ht 70.0 in | Wt 302.0 lb

## 2019-05-25 DIAGNOSIS — R112 Nausea with vomiting, unspecified: Secondary | ICD-10-CM

## 2019-05-25 DIAGNOSIS — D1803 Hemangioma of intra-abdominal structures: Secondary | ICD-10-CM

## 2019-05-25 DIAGNOSIS — B9681 Helicobacter pylori [H. pylori] as the cause of diseases classified elsewhere: Secondary | ICD-10-CM

## 2019-05-25 DIAGNOSIS — K2970 Gastritis, unspecified, without bleeding: Secondary | ICD-10-CM | POA: Diagnosis not present

## 2019-05-25 LAB — INSULIN, RANDOM: INSULIN: 35.2 u[IU]/mL — ABNORMAL HIGH (ref 2.6–24.9)

## 2019-05-25 LAB — THYROID ANTIBODIES
Thyroglobulin Antibody: <1 IU/mL (ref 0.0–0.9)
Thyroperoxidase Ab SerPl-aCnc: <9 IU/mL (ref 0–34)

## 2019-05-25 LAB — HEMOGLOBIN A1C
Est. average glucose Bld gHb Est-mCnc: 108 mg/dL
Hgb A1c MFr Bld: 5.4 % (ref 4.8–5.6)

## 2019-05-25 LAB — TSH: TSH: 1.55 u[IU]/mL (ref 0.450–4.500)

## 2019-05-25 MED ORDER — PANTOPRAZOLE SODIUM 40 MG PO TBEC: 40.0000 mg | DELAYED_RELEASE_TABLET | Freq: Two times a day (BID) | ORAL | 3 refills | Status: DC

## 2019-05-25 NOTE — Patient Instructions (Addendum)
I love your idea for a food journal. Hopefully this will help you identify some triggers.  Please increase your pantoprazole to 40 mg twice daily.  I am hoping this will help your nausea and vomiting.   Please call if your nausea and vomiting aren't improving. We would proceed with further testing of your gallbladder at that time.   We will plan to recheck your H pylori test in 6 weeks.  Your MRI showed a hemangioma in the liver. The [place] has information on "Benign Liver Lesions."   Return to this clinic after the H pylori test. Please call with any questions or concerns in the meantime.

## 2019-06-20 ENCOUNTER — Ambulatory Visit: Payer: Medicaid Other | Admitting: Gastroenterology

## 2019-06-21 ENCOUNTER — Ambulatory Visit: Payer: Medicaid Other | Admitting: Nurse Practitioner

## 2019-06-27 ENCOUNTER — Telehealth: Payer: Self-pay | Admitting: *Deleted

## 2019-06-27 ENCOUNTER — Encounter: Payer: Self-pay | Admitting: *Deleted

## 2019-06-27 NOTE — Telephone Encounter (Signed)
Left detailed message for patient. Sent patient a MyChart message stating she needed to come in to be retested for h. Pylori. Orders already in [place].

## 2019-07-19 ENCOUNTER — Telehealth: Payer: Self-pay | Admitting: Emergency Medicine

## 2019-07-19 NOTE — Telephone Encounter (Signed)
Called patient to remind her to have stool study done before appointment next week, she agreed and verbalized understanding.

## 2019-07-23 ENCOUNTER — Telehealth: Payer: Self-pay | Admitting: *Deleted

## 2019-07-23 ENCOUNTER — Encounter: Payer: Self-pay | Admitting: *Deleted

## 2019-07-23 ENCOUNTER — Ambulatory Visit: Payer: Medicaid Other | Admitting: Gastroenterology

## 2019-07-23 NOTE — Telephone Encounter (Signed)
Attempted to call the patient who did not answer.   H. Pylori stool test order already in Epic.   Detailed message left on the patient's VM stating the need for her to come in for h. Pylori retesting.   Letter mailed to patient requesting that the patient come in for labs.

## 2019-07-23 NOTE — Telephone Encounter (Signed)
-----   Message from Thornton Park, MD sent at 07/23/2019  2:03 PM EST ----- Patient did not keep her appointment with me today. Needs follow-up testing for Hpylori.

## 2019-08-29 ENCOUNTER — Telehealth: Payer: Self-pay

## 2019-08-29 NOTE — Telephone Encounter (Signed)
Pt left message for an appointment. Called pt to get clarification about her losing her voice. Pt also wanted appt for weight and swelling

## 2019-09-06 ENCOUNTER — Other Ambulatory Visit: Payer: Self-pay

## 2019-09-06 ENCOUNTER — Encounter: Payer: Self-pay | Admitting: Internal Medicine

## 2019-09-06 ENCOUNTER — Ambulatory Visit: Payer: Medicaid Other | Admitting: Internal Medicine

## 2019-09-06 VITALS — BP 120/72 | HR 97 | Temp 98.4°F | Ht 66.4 in | Wt 303.8 lb

## 2019-09-06 DIAGNOSIS — K219 Gastro-esophageal reflux disease without esophagitis: Secondary | ICD-10-CM | POA: Diagnosis not present

## 2019-09-06 DIAGNOSIS — E8881 Metabolic syndrome: Secondary | ICD-10-CM | POA: Diagnosis not present

## 2019-09-06 DIAGNOSIS — R0683 Snoring: Secondary | ICD-10-CM

## 2019-09-06 DIAGNOSIS — R600 Localized edema: Secondary | ICD-10-CM

## 2019-09-06 DIAGNOSIS — R490 Dysphonia: Secondary | ICD-10-CM

## 2019-09-06 MED ORDER — METFORMIN HCL 500 MG PO TABS: ORAL_TABLET | ORAL | 0 refills | Status: DC

## 2019-09-06 MED ORDER — TRIAMTERENE-HCTZ 37.5-25 MG PO TABS: ORAL_TABLET | ORAL | 0 refills | Status: DC

## 2019-09-06 NOTE — Progress Notes (Addendum)
This visit occurred during the SARS-CoV-2 public health emergency.  Safety protocols were in place, including screening questions prior to the visit, additional usage of staff PPE, and extensive cleaning of exam room while observing appropriate contact time as indicated for disinfecting solutions.  Subjective:     Patient ID: Rebecca Day , female    DOB: 1986/03/22 , 34 y.o.   MRN: [id number]   Chief Complaint  Patient presents with  . Weight Check  . Edema    HPI 1-Pt is here for wt check and has been consuming 1800 cal per day, and has not exercised as much as she had been.  2-Also FU edema. Her lower leg selling is still present, only takes maxide prn which helps.  3-Has noticed intermittent horseness x 1 month which comes out of the blue. Denies PND or sinus pressure. She has been taking Claritin qd, but does not seem to be helping.  4-Has been eating a lot of meatless foods since it is hard to digest meat. Has had GERD confirmed with her GI MD and was placed on Protonix since I saw her last time.   5- She denies hypersomnia, but she does snores, but has dreamed like she is gasping for air and woke up a few times. Has never had a sleep test.   Past Medical History:  Diagnosis Date  . Allergy   . Anxiety   . Asthma   . Blood transfusion without reported diagnosis    as child   . Clotting disorder ([place])    blood clot after last pregnancy   . Elevated blood sugar   . Kidney stones   . Lupus ([place])    questionable, no meds or treatment per pt  . 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[place])   . Ovarian cyst      Family History  Problem Relation Age of Onset  . Hypertension Mother   . Colon polyps Mother   . Cancer Father        dx'd in 46's  . Colon cancer Father   . Kidney disease Brother   . Liver disease Brother   . Cancer Brother   . Hyperlipidemia Maternal Grandmother   . Colon cancer Maternal Aunt   . Colon cancer Other   . Esophageal cancer Neg Hx   . Rectal cancer Neg Hx   .  Stomach cancer Neg Hx      Current Outpatient Medications:  .  acetaminophen (TYLENOL) 500 MG tablet, Take 1,000 mg by mouth every 6 (six) hours as needed for moderate pain., Disp: , Rfl:  .  buPROPion (WELLBUTRIN XL) 150 MG 24 hr tablet, Take 1 tablet (150 mg total) by mouth every morning., Disp: 30 tablet, Rfl: 0 .  dicyclomine (BENTYL) 20 MG tablet, Take 1 tablet (20 mg total) by mouth 4 (four) times daily -  before meals and at bedtime., Disp: 120 tablet, Rfl: 3 .  hydroxychloroquine (PLAQUENIL) 200 MG tablet, Take 1 tablet (200 mg total) by mouth 2 (two) times daily., Disp: 60 tablet, Rfl: 2 .  ondansetron (ZOFRAN-ODT) 8 MG disintegrating tablet, Take 1 tablet (8 mg total) by mouth every 8 (eight) hours as needed for nausea or vomiting., Disp: 20 tablet, Rfl: 0 .  pantoprazole (PROTONIX) 40 MG tablet, Take 1 tablet (40 mg total) by mouth 2 (two) times daily before a meal., Disp: 60 tablet, Rfl: 3 .  triamterene-hydrochlorothiazide (MAXZIDE-25) 37.5-25 MG tablet, 1/2 a day prn edema, Disp: 30 tablet, Rfl: 0   Allergies  Allergen Reactions  . Penicillins Hives    Has patient had a PCN reaction causing immediate rash, facial/tongue/throat swelling, SOB or lightheadedness with hypotension: Yes Has patient had a PCN reaction causing severe rash involving mucus membranes or skin necrosis: No Has patient had a PCN reaction that required hospitalization: Yes Has patient had a PCN reaction occurring within the last 10 years: NO If all of the above answers are "NO", then may proceed with Cephalosporin use.     Review of Systems  + GERD, hoarseness, snoring, increased stress, mild edema. The rest of 10 point ROS is neg.  Today's Vitals   09/06/19 1548  BP: 120/72  Pulse: 97  Temp: 98.4 F (36.9 C)  Weight: (!) 303 lb 12.8 oz (137.8 kg)  Height: 5' 6.4" (1.687 m)   Body mass index is 48.45 kg/m.   Objective:  Physical Exam  Wt 1 lb down    Constitutional: She is oriented to person,  place, and time. She appears well-developed and well-nourished. No distress.  HENT:  Head: Normocephalic and atraumatic.  Right Ear: External ear normal.  Left Ear: External ear normal.  Nose: Nose normal.  Eyes: Conjunctivae are normal. Right eye exhibits no discharge. Left eye exhibits no discharge. No scleral icterus.  Neck: Neck supple. No thyromegaly present.  No carotid bruits bilaterally  Cardiovascular: Normal rate and regular rhythm.  No murmur heard. Pulmonary/Chest: Effort normal and breath sounds normal. No respiratory distress.  Musculoskeletal: Normal range of motion. She exhibits no edema.  Lymphadenopathy:    She has no cervical adenopathy.  Neurological: She is alert and oriented to person, place, and time.  Skin: Skin is warm and dry. Capillary refill takes less than 2 seconds. No rash noted. She is not diaphoretic.  Psychiatric: She has a normal mood and affect. Her behavior is normal. Judgment and thought content normal.  Nursing note reviewed.  Assessment And Plan:   1. Snoring- chronic.  - Ambulatory referral to Neurology  2. Localized edema- improving. May be related to sleep apnea.  - Ambulatory referral to Neurology  3. Insulin resistance-new. Is interested in trying Metformin to see if this may help her loose wt. - metFORMIN (GLUCOPHAGE) 500 MG tablet; One bid with meals  Dispense: 60 tablet; Refill: 0  4. Gastroesophageal reflux disease without esophagitis- under GI care. May continue current med.   5. Hoarseness- intermittent. May be due to GERD, so will give PPI more time.   FU in 1 month for wt check   Lance Galas RODRIGUEZ-SOUTHWORTH, PA-C    THE PATIENT IS ENCOURAGED TO PRACTICE SOCIAL DISTANCING DUE TO THE COVID-19 PANDEMIC.

## 2019-09-06 NOTE — Patient Instructions (Signed)
Look up polycystic ovarian syndrome diet at [email or web address]

## 2019-09-12 ENCOUNTER — Other Ambulatory Visit: Payer: Self-pay

## 2019-09-12 ENCOUNTER — Ambulatory Visit (HOSPITAL_COMMUNITY)
Admission: EM | Admit: 2019-09-12 | Discharge: 2019-09-12 | Disposition: A | Discharge status: Home / Self Care | Payer: Medicaid Other | Attending: Family Medicine | Admitting: Family Medicine

## 2019-09-12 ENCOUNTER — Encounter (HOSPITAL_COMMUNITY): Payer: Self-pay

## 2019-09-12 DIAGNOSIS — R109 Unspecified abdominal pain: Secondary | ICD-10-CM | POA: Diagnosis not present

## 2019-09-12 DIAGNOSIS — R34 Anuria and oliguria: Secondary | ICD-10-CM

## 2019-09-12 DIAGNOSIS — Z3202 Encounter for pregnancy test, result negative: Secondary | ICD-10-CM

## 2019-09-12 LAB — GLUCOSE, CAPILLARY: Glucose-Capillary: 96 mg/dL (ref 70–99)

## 2019-09-12 LAB — POCT URINALYSIS DIP (DEVICE)
Bilirubin Urine: NEGATIVE
Glucose, UA: NEGATIVE mg/dL
Ketones, ur: NEGATIVE mg/dL
Nitrite: NEGATIVE
Protein, ur: NEGATIVE mg/dL
Specific Gravity, Urine: >=1.03 (ref 1.005–1.030)
Urobilinogen, UA: 0.2 mg/dL (ref 0.0–1.0)
pH: 5.5 (ref 5.0–8.0)

## 2019-09-12 LAB — CBG MONITORING, ED
Glucose-Capillary: 96 mg/dL (ref 70–99)
Glucose-Capillary: 96 mg/dL (ref 70–99)

## 2019-09-12 LAB — POC URINE PREG, ED
Preg Test, Ur: NEGATIVE
Preg Test, Ur: NEGATIVE

## 2019-09-12 LAB — POCT PREGNANCY, URINE: Preg Test, Ur: NEGATIVE

## 2019-09-12 MED ORDER — SULFAMETHOXAZOLE-TRIMETHOPRIM 800-160 MG PO TABS: 1.0000 | ORAL_TABLET | Freq: Two times a day (BID) | ORAL | 0 refills | Status: DC

## 2019-09-12 MED ORDER — IBUPROFEN 600 MG PO TABS: 600.0000 mg | ORAL_TABLET | Freq: Four times a day (QID) | ORAL | 0 refills | Status: DC | PRN

## 2019-09-12 NOTE — ED Provider Notes (Signed)
[facility]    CSN: [id number] Arrival date & time: 09/12/19  1725      History   Chief Complaint Chief Complaint  Patient presents with  . Abdominal Pain  . Emesis    HPI Rebecca Day is a 34 y.o. female.   She is presenting with left leg pain.  She has a history of nephrolithiasis and this feels similar to that.  She is having excruciating pain that is intermittent in nature.  She is having urinary changes as well.  She has decreased urine output.  She denies any new or different medications.  She denies any fevers.  Has not had any dysuria or any vaginal discharge.  She has a history of lupus but this feels different than a lupus flare.  She is not having any swelling of her hands or feet that are out of the ordinary.  HPI  Past Medical History:  Diagnosis Date  . Allergy   . Anxiety   . Asthma   . Blood transfusion without reported diagnosis    as child   . Clotting disorder ([place])    blood clot after last pregnancy   . Elevated blood sugar   . Kidney stones   . Lupus ([place])    questionable, no meds or treatment per pt  . 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[place])   . Ovarian cyst     Patient Active Problem List   Diagnosis Date Noted  . Left lower quadrant abdominal pain 12/21/2018  . Nausea 12/21/2018  . History of blood clots 12/05/2018  . Muscle ache 12/05/2018  . Abnormal CT of liver 12/05/2018  . 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 Lupus ([place]) 11/02/2018  . Flank pain 11/02/2018  . Depression 11/02/2018  . History of hematuria 11/02/2018  . Family history of spina bifida 07/06/2011    Past Surgical History:  Procedure Laterality Date  . CESAREAN SECTION     C/S x 2  . PILONIDAL CYST DRAINAGE      OB History    Gravida  9   Para  2   Term  2   Preterm  0   AB  6   Living  2     SAB  6   TAB  0   Ectopic  0   Multiple  0   Live Births  2            Home Medications    Prior to Admission medications   Medication  Sig Start Date End Date Taking? Authorizing Provider  metFORMIN (GLUCOPHAGE) 500 MG tablet One bid with meals 09/06/19  Yes Rodriguez-Southworth, Sunday Spillers, PA-C  triamterene-hydrochlorothiazide (MAXZIDE-25) 37.5-25 MG tablet 1- 1/2  a day prn edema 09/06/19  Yes Rodriguez-Southworth, Sunday Spillers, PA-C  acetaminophen (TYLENOL) 500 MG tablet Take 1,000 mg by mouth every 6 (six) hours as needed for moderate pain.    [provider]  buPROPion (WELLBUTRIN XL) 150 MG 24 hr tablet Take 1 tablet (150 mg total) by mouth every morning. 05/03/19 05/02/20  Rodriguez-Southworth, Sunday Spillers, PA-C  dicyclomine (BENTYL) 20 MG tablet Take 1 tablet (20 mg total) by mouth 4 (four) times daily -  before meals and at bedtime. 12/06/18   Thornton Park, MD  hydroxychloroquine (PLAQUENIL) 200 MG tablet Take 1 tablet (200 mg total) by mouth 2 (two) times daily. 11/02/18 11/02/19  [name], FNP  ibuprofen (ADVIL) 600 MG tablet Take 1 tablet (600 mg total) by mouth every 6 (six) hours as needed. 09/12/19  Rosemarie Ax, MD  ondansetron (ZOFRAN-ODT) 8 MG disintegrating tablet Take 1 tablet (8 mg total) by mouth every 8 (eight) hours as needed for nausea or vomiting. [id number]   Delora Fuel, MD  pantoprazole (PROTONIX) 40 MG tablet Take 1 tablet (40 mg total) by mouth 2 (two) times daily before a meal. 05/25/19   Thornton Park, MD  sulfamethoxazole-trimethoprim (BACTRIM DS) 800-160 MG tablet Take 1 tablet by mouth 2 (two) times daily. 09/12/19   Rosemarie Ax, MD    Family History Family History  Problem Relation Age of Onset  . Hypertension Mother   . Colon polyps Mother   . Cancer Father        dx'd in 44's  . Colon cancer Father   . Kidney disease Brother   . Liver disease Brother   . Cancer Brother   . Hyperlipidemia Maternal Grandmother   . Colon cancer Maternal Aunt   . Colon cancer Other   . Esophageal cancer Neg Hx   . Rectal cancer Neg Hx   . Stomach cancer Neg Hx     Social History Social History    Tobacco Use  . Smoking status: Never Smoker  . Smokeless tobacco: Never Used  Substance Use Topics  . Alcohol use: Yes    Comment: socially  . Drug use: No     Allergies   Penicillins   Review of Systems Review of Systems See hpi   Physical Exam Triage Vital Signs ED Triage Vitals  Enc Vitals Group     BP 09/12/19 1747 124/74     Pulse Rate 09/12/19 1747 100     Resp 09/12/19 1747 18     Temp 09/12/19 1747 98.4 F (36.9 C)     Temp Source 09/12/19 1747 Oral     SpO2 09/12/19 1747 100 %     Weight --      Height --      Head Circumference --      Peak Flow --      Pain Score 09/12/19 1744 6     Pain Loc --      Pain Edu? --      Excl. in [place]? --    No data found.  Updated Vital Signs BP 124/74 (BP Location: Left Arm)   Pulse 100   Temp 98.4 F (36.9 C) (Oral)   Resp 18   LMP 08/21/2019   SpO2 100%   Visual Acuity Right Eye Distance:   Left Eye Distance:   Bilateral Distance:    Right Eye Near:   Left Eye Near:    Bilateral Near:     Physical Exam Gen: NAD, alert, cooperative with exam, well-appearing ENT: normal lips, normal nasal mucosa,  Eye: normal EOM, normal conjunctiva and lids CV:  no edema, +2 pedal pulses   Resp: no accessory muscle use, non-labored,  GI: no masses or tenderness, no hernia  Skin: no rashes, no areas of induration  Neuro: normal tone, normal sensation to touch Psych:  normal insight, alert and oriented MSK: Tenderness to palpation over the left flank   UC Treatments / Results  Labs (all labs ordered are listed, but only abnormal results are displayed) Labs Reviewed  POCT URINALYSIS DIP (DEVICE) - Abnormal; Notable for the following components:      Result Value   Hgb urine dipstick SMALL (*)    Leukocytes,Ua TRACE (*)    All other components within normal limits  URINE CULTURE  GLUCOSE, CAPILLARY  CBG MONITORING, ED  CBG MONITORING, ED  POCT PREGNANCY, URINE  POC URINE PREG, ED  POC URINE PREG, ED     EKG   Radiology No results found.  Procedures Procedures (including critical care time)  Medications Ordered in UC Medications - No data to display  Initial Impression / Assessment and Plan / UC Course  I have reviewed the triage vital signs and the nursing notes.  Pertinent labs & imaging results that were available during my care of the patient were reviewed by me and considered in my medical decision making (see chart for details).  Clinical Course as of Sep 11 1830  Wed Sep 12, 2019  1807 POCT urinalysis dip (device)(!) [JS]    Clinical Course User Index [JS] Rosemarie Ax, MD    Ms. Mccurley is a 34 year old female is presenting with left flank pain.  Urinalysis was not conclusive for nephrolithiasis or cystitis.  Will send for urine culture.  Provide ibuprofen.  Counseled that she could hold onto the Bactrim until urine culture returns.  Provided a strainer.  She looks well with no significant infection to suggest [place].  Advise close follow-up.  Given indications to follow-up and return.  Final Clinical Impressions(s) / UC Diagnoses   Final diagnoses:  Flank pain     Discharge Instructions     Please try the ibuprofen  Please strain your urine  You can hold on to the antibiotic and see what your urine culture shows  Please be seen more immediately if your pain worsens     ED Prescriptions    Medication Sig Dispense Auth. Provider   sulfamethoxazole-trimethoprim (BACTRIM DS) 800-160 MG tablet Take 1 tablet by mouth 2 (two) times daily. 14 tablet Rosemarie Ax, MD   ibuprofen (ADVIL) 600 MG tablet Take 1 tablet (600 mg total) by mouth every 6 (six) hours as needed. 30 tablet Rosemarie Ax, MD     PDMP not reviewed this encounter.   Rosemarie Ax, MD 09/12/19 [phone]

## 2019-09-12 NOTE — Discharge Instructions (Addendum)
Please try the ibuprofen  Please strain your urine  You can hold on to the antibiotic and see what your urine culture shows  Please be seen more immediately if your pain worsens

## 2019-09-12 NOTE — ED Triage Notes (Signed)
Pressure on urination, diminished urine o/p x 1week, abd pain and left flank pain x3 days; n/v and fever of 100.3 yesterday. Denies hematuria. States recently started metformin last Saturday, diminished food intake today.  States h/o renal stones and uti.

## 2019-09-12 NOTE — ED Triage Notes (Signed)
Pt states has not checked CBG today. CBG performed, 96

## 2019-09-14 LAB — URINE CULTURE: Culture: <10000 — AB

## 2019-10-04 ENCOUNTER — Ambulatory Visit: Payer: Medicaid Other | Admitting: Nurse Practitioner

## 2020-01-08 ENCOUNTER — Other Ambulatory Visit: Payer: Self-pay | Admitting: Gastroenterology

## 2020-01-08 ENCOUNTER — Other Ambulatory Visit: Payer: Self-pay | Admitting: Nurse Practitioner

## 2020-01-08 DIAGNOSIS — M329 Systemic lupus erythematosus, unspecified: Secondary | ICD-10-CM

## 2020-05-01 ENCOUNTER — Ambulatory Visit: Payer: Medicaid Other | Admitting: Nurse Practitioner

## 2020-05-01 ENCOUNTER — Other Ambulatory Visit: Payer: Self-pay

## 2020-05-01 ENCOUNTER — Encounter: Payer: Self-pay | Admitting: Nurse Practitioner

## 2020-05-01 VITALS — BP 126/80 | HR 82 | Temp 97.8°F | Wt 304.0 lb

## 2020-05-01 DIAGNOSIS — R109 Unspecified abdominal pain: Secondary | ICD-10-CM | POA: Diagnosis not present

## 2020-05-01 DIAGNOSIS — Z6841 Body Mass Index (BMI) 40.0 and over, adult: Secondary | ICD-10-CM

## 2020-05-01 DIAGNOSIS — E8881 Metabolic syndrome: Secondary | ICD-10-CM | POA: Diagnosis not present

## 2020-05-01 DIAGNOSIS — N912 Amenorrhea, unspecified: Secondary | ICD-10-CM | POA: Diagnosis not present

## 2020-05-01 NOTE — Progress Notes (Signed)
I,Yamilka Roman [employer] as a Education administrator for [employer], FNP.,have documented all relevant documentation on the behalf of [name], FNP,as directed by  [name], FNP while in the presence of [name], [place].  This visit occurred during the SARS-CoV-2 public health emergency.  Safety protocols were in place, including screening questions prior to the visit, additional usage of staff PPE, and extensive cleaning of exam room while observing appropriate contact time as indicated for disinfecting solutions.  Subjective:     Patient ID: Rebecca Day , female    DOB: 08-Mar-1986 , 34 y.o.   MRN: [id number]   Chief Complaint  Patient presents with  . missed period    patient stated her period is 3-4 weeks and she also has been having migraines as well. she stated she has the cramping as well but it doesnt come     HPI  Here due to missing her menstrual cycle x 8 weeks. She continues to have the cramping as if her cycle was coming on. She is also having migraines. She has not been irregular with her cycle.   Wt Readings from Last 3 Encounters: 05/01/20 : (!) 304 lb (137.9 kg) 09/06/19 : (!) 303 lb 12.8 oz (137.8 kg) 05/25/19 : (!) 302 lb (137 kg)  She has left her husband.       Past Medical History:  Diagnosis Date  . Allergy   . Anxiety   . Asthma   . Blood transfusion without reported diagnosis    as child   . Clotting disorder ([place])    blood clot after last pregnancy   . Elevated blood sugar   . Kidney stones   . Lupus ([place])    questionable, no meds or treatment per pt  . 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[place])   . Ovarian cyst      Family History  Problem Relation Age of Onset  . Hypertension Mother   . Colon polyps Mother   . Cancer Father        dx'd in 26's  . Colon cancer Father   . Kidney disease Brother   . Liver disease Brother   . Cancer Brother   . Hyperlipidemia Maternal Grandmother   . Colon cancer Maternal Aunt   . Colon cancer Other   . Esophageal cancer  Neg Hx   . Rectal cancer Neg Hx   . Stomach cancer Neg Hx      Current Outpatient Medications:  .  acetaminophen (TYLENOL) 500 MG tablet, Take 1,000 mg by mouth every 6 (six) hours as needed for moderate pain., Disp: , Rfl:  .  ibuprofen (ADVIL) 600 MG tablet, Take 1 tablet (600 mg total) by mouth every 6 (six) hours as needed., Disp: 30 tablet, Rfl: 0 .  ondansetron (ZOFRAN-ODT) 8 MG disintegrating tablet, Take 1 tablet (8 mg total) by mouth every 8 (eight) hours as needed for nausea or vomiting., Disp: 20 tablet, Rfl: 0 .  triamterene-hydrochlorothiazide (MAXZIDE-25) 37.5-25 MG tablet, 1- 1/2  a day prn edema (Patient not taking: Reported on 05/01/2020), Disp: 30 tablet, Rfl: 0   Allergies  Allergen Reactions  . Penicillins Hives    Has patient had a PCN reaction causing immediate rash, facial/tongue/throat swelling, SOB or lightheadedness with hypotension: Yes Has patient had a PCN reaction causing severe rash involving mucus membranes or skin necrosis: No Has patient had a PCN reaction that required hospitalization: Yes Has patient had a PCN reaction occurring within the last 10 years: NO If all of  the above answers are "NO", then may proceed with Cephalosporin use.     Review of Systems  Constitutional: Negative.   Respiratory: Negative.   Cardiovascular: Negative.  Negative for chest pain, palpitations and leg swelling.  Genitourinary: Positive for menstrual problem (no menses x 9 weeks).  Neurological: Negative for dizziness and headaches.  Psychiatric/Behavioral: Negative.      Today's Vitals   05/01/20 1157  BP: 126/80  Pulse: 82  Temp: 97.8 F (36.6 C)  TempSrc: Oral  Weight: (!) 304 lb (137.9 kg)  PainSc: 3   PainLoc: Abdomen   Body mass index is 48.48 kg/m.   Objective:  Physical Exam Constitutional:      General: She is not in acute distress.    Appearance: Normal appearance. She is obese.  Cardiovascular:     Rate and Rhythm: Normal rate and regular  rhythm.     Pulses: Normal pulses.     Heart sounds: Normal heart sounds. No murmur heard.   Pulmonary:     Effort: Pulmonary effort is normal. No respiratory distress.     Breath sounds: Normal breath sounds.  Abdominal:     General: Bowel sounds are normal. There is no distension.     Palpations: Abdomen is soft. There is no mass.     Tenderness: There is abdominal tenderness (left lower quadrant).  Neurological:     General: No focal deficit present.     Mental Status: She is alert.     Cranial Nerves: No cranial nerve deficit.  Psychiatric:        Mood and Affect: Mood normal.        Behavior: Behavior normal.        Thought Content: Thought content normal.        Judgment: Judgment normal.         Assessment And Plan:     1. Abdominal cramping  Pending results of labs will check ultrasound  2. Amenorrhea  Negative urine pregnancy test   Will check beta hcg  She has a gyn and I have advised her to contact them in the event she will need to be seen.  - Insulin, random - TSH - Beta HCG, Quant ([facility])  3. Insulin resistance  She is encouraged to avoid high sugar intake and carbs - CBC - BMP8+eGFR  4. Class 3 severe obesity due to excess calories without serious comorbidity with body mass index (BMI) of 45.0 to 49.9 in adult [facility]) Chronic Discussed healthy diet and regular exercise options  Encouraged to exercise at least 150 minutes per week with 2 days of strength training    Patient was given opportunity to ask questions. Patient verbalized understanding of the plan and was able to repeat key elements of the plan. All questions were answered to their satisfaction.    Teola Bradley, FNP, have reviewed all documentation for this visit. The documentation on 05/05/20 for the exam, diagnosis, procedures, and orders are all accurate and complete.   THE PATIENT IS ENCOURAGED TO PRACTICE SOCIAL DISTANCING DUE TO THE COVID-19 PANDEMIC.

## 2020-05-02 ENCOUNTER — Telehealth: Payer: Self-pay

## 2020-05-02 LAB — BMP8+EGFR
BUN/Creatinine Ratio: 10 (ref 9–23)
BUN: 8 mg/dL (ref 6–20)
CO2: 23 mmol/L (ref 20–29)
Calcium: 9.3 mg/dL (ref 8.7–10.2)
Chloride: 103 mmol/L (ref 96–106)
Creatinine, Ser: 0.78 mg/dL (ref 0.57–1.00)
GFR calc Af Amer: 117 mL/min/{1.73_m2} (ref >59)
GFR calc non Af Amer: 102 mL/min/{1.73_m2} (ref >59)
Glucose: 79 mg/dL (ref 65–99)
Potassium: 4.7 mmol/L (ref 3.5–5.2)
Sodium: 140 mmol/L (ref 134–144)

## 2020-05-02 LAB — BETA HCG QUANT (REF LAB): hCG Quant: <1 m[IU]/mL

## 2020-05-02 LAB — CBC
Hematocrit: 38.3 % (ref 34.0–46.6)
Hemoglobin: 12.8 g/dL (ref 11.1–15.9)
MCH: 29.1 pg (ref 26.6–33.0)
MCHC: 33.4 g/dL (ref 31.5–35.7)
MCV: 87 fL (ref 79–97)
Platelets: 361 10*3/uL (ref 150–450)
RBC: 4.4 x10E6/uL (ref 3.77–5.28)
RDW: 12.9 % (ref 11.7–15.4)
WBC: 6.4 10*3/uL (ref 3.4–10.8)

## 2020-05-02 LAB — INSULIN, RANDOM: INSULIN: 20.3 u[IU]/mL (ref 2.6–24.9)

## 2020-05-02 LAB — TSH: TSH: 0.742 u[IU]/mL (ref 0.450–4.500)

## 2020-05-02 NOTE — Telephone Encounter (Signed)
Patient notified that her labs were normal and she was advised to call her obgyn and let them know she is having pain so she can get an ultrasound done. YL,RMA

## 2020-05-05 ENCOUNTER — Encounter: Payer: Self-pay | Admitting: Nurse Practitioner

## 2020-05-05 DIAGNOSIS — Z6841 Body Mass Index (BMI) 40.0 and over, adult: Secondary | ICD-10-CM | POA: Insufficient documentation

## 2020-05-05 DIAGNOSIS — E8881 Metabolic syndrome: Secondary | ICD-10-CM | POA: Insufficient documentation

## 2020-05-05 DIAGNOSIS — E66812 Obesity, class 2: Secondary | ICD-10-CM | POA: Insufficient documentation

## 2020-05-09 ENCOUNTER — Other Ambulatory Visit: Payer: Self-pay

## 2020-05-09 ENCOUNTER — Encounter: Payer: Self-pay | Admitting: Family Medicine

## 2020-05-09 ENCOUNTER — Other Ambulatory Visit (HOSPITAL_COMMUNITY)
Admission: RE | Admit: 2020-05-09 | Discharge: 2020-05-09 | Disposition: A | Discharge status: Home / Self Care | Payer: Medicaid Other | Source: Ambulatory Visit | Attending: Family Medicine | Admitting: Family Medicine

## 2020-05-09 ENCOUNTER — Ambulatory Visit (INDEPENDENT_AMBULATORY_CARE_PROVIDER_SITE_OTHER): Payer: Medicaid Other | Admitting: Family Medicine

## 2020-05-09 VITALS — BP 117/79 | HR 92 | Ht 70.0 in | Wt 306.0 lb

## 2020-05-09 DIAGNOSIS — N939 Abnormal uterine and vaginal bleeding, unspecified: Secondary | ICD-10-CM | POA: Diagnosis not present

## 2020-05-09 DIAGNOSIS — R102 Pelvic and perineal pain: Secondary | ICD-10-CM | POA: Diagnosis not present

## 2020-05-09 DIAGNOSIS — Z01419 Encounter for gynecological examination (general) (routine) without abnormal findings: Secondary | ICD-10-CM | POA: Diagnosis not present

## 2020-05-09 DIAGNOSIS — Z23 Encounter for immunization: Secondary | ICD-10-CM

## 2020-05-09 MED ORDER — MEGESTROL ACETATE 40 MG PO TABS: 40.0000 mg | ORAL_TABLET | Freq: Every day | ORAL | 3 refills | Status: DC

## 2020-05-09 NOTE — Progress Notes (Signed)
GYNECOLOGY ANNUAL PREVENTATIVE CARE ENCOUNTER NOTE  Subjective:   Rebecca Day is a 34 y.o. [id number] female here for a routine annual gynecologic exam.  Current complaints: left pelvic pain for the past few months. She has been having this pain intermittently for a few years, but now more consistent. Worse with menses. Has history of 2 cesarean deliveries. Also went 8 weeks between cycles and had heavy bleeding for almost 2 weeks. Normally, periods fairly regular. Was told at one point that she had PCOS due to polycystic ovaries. Denies abnormal vaginal bleeding, discharge, pelvic pain, problems with intercourse or other gynecologic concerns.    Gynecologic History Patient's last menstrual period was 05/01/2020. Patient is sexually active  Contraception: none Last Pap: 2019. Results were: normal Last mammogram: n/a.  Obstetric History OB History  Gravida Para Term Preterm AB Living  11 4 3  0 6 2  SAB TAB Ectopic Multiple Live Births  6 0 0 0 2    # Outcome Date GA Lbr Len/2nd Weight Sex Delivery Anes PTL Lv  11 Para 2014 [redacted]w[redacted]d   F CS-LTranv Spinal N LIV  10 Term 2010 [redacted]w[redacted]d   F CS-LTranv Spinal N LIV  9 SAB           8 SAB           7 SAB           6 SAB           5 Gravida           4 Term           3 SAB           2 Term           1 SAB             Past Medical History:  Diagnosis Date  . Allergy   . Anxiety   . Asthma   . Blood transfusion without reported diagnosis    as child   . Clotting disorder ([place])    blood clot after last pregnancy   . Elevated blood sugar   . Kidney stones   . Lupus ([place])    questionable, no meds or treatment per pt  . 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[place])   . Ovarian cyst     Past Surgical History:  Procedure Laterality Date  . CESAREAN SECTION     C/S x 2  . PILONIDAL CYST DRAINAGE      Current Outpatient Medications on File Prior to Visit  Medication Sig Dispense Refill  . acetaminophen (TYLENOL) 500 MG tablet Take 1,000 mg by mouth  every 6 (six) hours as needed for moderate pain.    Marland Kitchen ondansetron (ZOFRAN-ODT) 8 MG disintegrating tablet Take 1 tablet (8 mg total) by mouth every 8 (eight) hours as needed for nausea or vomiting. 20 tablet 0  . ibuprofen (ADVIL) 600 MG tablet Take 1 tablet (600 mg total) by mouth every 6 (six) hours as needed. (Patient not taking: Reported on 05/09/2020) 30 tablet 0  . triamterene-hydrochlorothiazide (MAXZIDE-25) 37.5-25 MG tablet 1- 1/2  a day prn edema (Patient not taking: Reported on 05/01/2020) 30 tablet 0   No current facility-administered medications on file prior to visit.    Allergies  Allergen Reactions  . Penicillins Hives    Has patient had a PCN reaction causing immediate rash, facial/tongue/throat swelling, SOB or lightheadedness with hypotension: Yes Has patient had a PCN reaction causing severe rash involving mucus  membranes or skin necrosis: No Has patient had a PCN reaction that required hospitalization: Yes Has patient had a PCN reaction occurring within the last 10 years: NO If all of the above answers are "NO", then may proceed with Cephalosporin use.    Social History   Socioeconomic History  . Marital status: Single    Spouse name: Not on file  . Number of children: Not on file  . Years of education: Not on file  . Highest education level: Not on file  Occupational History  . Not on file  Tobacco Use  . Smoking status: Never Smoker  . Smokeless tobacco: Never Used  Vaping Use  . Vaping Use: Never used  Substance and Sexual Activity  . Alcohol use: Yes    Comment: socially  . Drug use: No  . Sexual activity: Yes    Birth control/protection: None  Other Topics Concern  . Not on file  Social History Narrative  . Not on file   Social Determinants of Health   Financial Resource Strain:   . Difficulty of Paying Living Expenses: Not on file  Food Insecurity:   . Worried About Charity fundraiser in the Last Year: Not on file  . Ran Out of Food in the  Last Year: Not on file  Transportation Needs:   . Lack of Transportation (Medical): Not on file  . Lack of Transportation (Non-Medical): Not on file  Physical Activity:   . Days of Exercise per Week: Not on file  . Minutes of Exercise per Session: Not on file  Stress:   . Feeling of Stress : Not on file  Social Connections:   . Frequency of Communication with Friends and Family: Not on file  . Frequency of Social Gatherings with Friends and Family: Not on file  . Attends Religious Services: Not on file  . Active Member of Clubs or Organizations: Not on file  . Attends Archivist Meetings: Not on file  . Marital Status: Not on file  Intimate Partner Violence:   . Fear of Current or Ex-Partner: Not on file  . Emotionally Abused: Not on file  . Physically Abused: Not on file  . Sexually Abused: Not on file    Family History  Problem Relation Age of Onset  . Hypertension Mother   . Colon polyps Mother   . Cancer Father        dx'd in 38's  . Colon cancer Father   . Kidney disease Brother   . Liver disease Brother   . Cancer Brother   . Hyperlipidemia Maternal Grandmother   . Colon cancer Maternal Aunt   . Colon cancer Other   . Esophageal cancer Neg Hx   . Rectal cancer Neg Hx   . Stomach cancer Neg Hx     The following portions of the patient's history were reviewed and updated as appropriate: allergies, current medications, past family history, past medical history, past social history, past surgical history and problem list.  Review of Systems Pertinent items are noted in HPI.   Objective:  BP 117/79   Pulse 92   Ht 5\' 10"  (1.778 m)   Wt (!) 306 lb (138.8 kg)   LMP 05/01/2020   BMI 43.91 kg/m  Wt Readings from Last 3 Encounters:  05/09/20 (!) 306 lb (138.8 kg)  05/01/20 (!) 304 lb (137.9 kg)  09/06/19 (!) 303 lb 12.8 oz (137.8 kg)     Chaperone present during exam  CONSTITUTIONAL:  Well-developed, well-nourished female in no acute distress.    HENT:  Normocephalic, atraumatic, External right and left ear normal. Oropharynx is clear and moist EYES: Conjunctivae and EOM are normal. Pupils are equal, round, and reactive to light. No scleral icterus.  NECK: Normal range of motion, supple, no masses.  Normal thyroid.   CARDIOVASCULAR: Normal heart rate noted, regular rhythm RESPIRATORY: Clear to auscultation bilaterally. Effort and breath sounds normal, no problems with respiration noted. BREASTS: Symmetric in size. No masses, skin changes, nipple drainage, or lymphadenopathy. ABDOMEN: Soft, normal bowel sounds, no distention noted.  No tenderness, rebound or guarding.  PELVIC: Normal appearing external genitalia; normal appearing vaginal mucosa and cervix.  No abnormal discharge noted.  Normal uterine size, no other palpable masses, no uterine or adnexal tenderness. MUSCULOSKELETAL: Normal range of motion. No tenderness.  No cyanosis, clubbing, or edema.  2+ distal pulses. SKIN: Skin is warm and dry. No rash noted. Not diaphoretic. No erythema. No pallor. NEUROLOGIC: Alert and oriented to person, place, and time. Normal reflexes, muscle tone coordination. No cranial nerve deficit noted. PSYCHIATRIC: Normal mood and affect. Normal behavior. Normal judgment and thought content.  Assessment:  Annual gynecologic examination with pap smear   Plan:  1. Well Woman Exam Will follow up results of pap smear and manage accordingly. - Cytology - PAP( [place]) - Flu Vaccine QUAD 36+ mos IM  2. Abnormal uterine bleeding (AUB) ? PCOS. Certainly has history of insulin resistance. Discussed options to control bleeding, including megace, IUD, nexplanon, depo provera. Due to h/o PE after c/s with her daughter, COCs are contraindicated. Will start with megace. Check labs. - TestT+TestF+SHBG - Follicle stimulating hormone - LH - US PELVIS TRANSVAGINAL NON-OB (TV ONLY); Future  3. Pelvic pain Check [place]. Will try to get last c/s records. - US  PELVIS TRANSVAGINAL NON-OB (TV ONLY); Future   Routine preventative health maintenance measures emphasized. Please refer to After Visit Summary for other counseling recommendations.    Loma Boston, [place] for [employer]

## 2020-05-12 LAB — CYTOLOGY - PAP
Adequacy: ABSENT
Comment: NEGATIVE
Diagnosis: NEGATIVE
High risk HPV: NEGATIVE

## 2020-05-14 ENCOUNTER — Ambulatory Visit (HOSPITAL_BASED_OUTPATIENT_CLINIC_OR_DEPARTMENT_OTHER)
Admission: RE | Admit: 2020-05-14 | Discharge: 2020-05-14 | Disposition: A | Discharge status: Home / Self Care | Payer: Medicaid Other | Source: Ambulatory Visit | Attending: Family Medicine | Admitting: Family Medicine

## 2020-05-14 ENCOUNTER — Other Ambulatory Visit: Payer: Self-pay

## 2020-05-14 DIAGNOSIS — R102 Pelvic and perineal pain unspecified side: Secondary | ICD-10-CM

## 2020-05-14 DIAGNOSIS — N939 Abnormal uterine and vaginal bleeding, unspecified: Secondary | ICD-10-CM | POA: Diagnosis not present

## 2020-05-14 LAB — LUTEINIZING HORMONE: LH: 3.9 m[IU]/mL

## 2020-05-14 LAB — TESTT+TESTF+SHBG
Sex Hormone Binding: 33.4 nmol/L (ref 24.6–122.0)
Testosterone, Free: 0.6 pg/mL (ref 0.0–4.2)
Testosterone, Total, LC/MS: 48.3 ng/dL (ref 10.0–55.0)

## 2020-05-14 LAB — FOLLICLE STIMULATING HORMONE: FSH: 4.5 m[IU]/mL

## 2020-06-13 ENCOUNTER — Other Ambulatory Visit: Payer: Self-pay

## 2020-06-13 ENCOUNTER — Encounter (HOSPITAL_BASED_OUTPATIENT_CLINIC_OR_DEPARTMENT_OTHER): Payer: Self-pay | Admitting: *Deleted

## 2020-06-13 ENCOUNTER — Emergency Department (HOSPITAL_BASED_OUTPATIENT_CLINIC_OR_DEPARTMENT_OTHER)
Admission: EM | Admit: 2020-06-13 | Discharge: 2020-06-14 | Disposition: A | Discharge status: Home / Self Care | Payer: Medicaid Other | Attending: Emergency Medicine | Admitting: Emergency Medicine

## 2020-06-13 DIAGNOSIS — R519 Headache, unspecified: Secondary | ICD-10-CM | POA: Diagnosis not present

## 2020-06-13 DIAGNOSIS — R109 Unspecified abdominal pain: Secondary | ICD-10-CM | POA: Diagnosis not present

## 2020-06-13 DIAGNOSIS — R7401 Elevation of levels of liver transaminase levels: Secondary | ICD-10-CM | POA: Diagnosis not present

## 2020-06-13 DIAGNOSIS — R112 Nausea with vomiting, unspecified: Secondary | ICD-10-CM | POA: Insufficient documentation

## 2020-06-13 DIAGNOSIS — J45909 Unspecified asthma, uncomplicated: Secondary | ICD-10-CM | POA: Diagnosis not present

## 2020-06-13 LAB — CBC
HCT: 42.3 % (ref 36.0–46.0)
Hemoglobin: 13.8 g/dL (ref 12.0–15.0)
MCH: 28.2 pg (ref 26.0–34.0)
MCHC: 32.6 g/dL (ref 30.0–36.0)
MCV: 86.5 fL (ref 80.0–100.0)
Platelets: 355 10*3/uL (ref 150–400)
RBC: 4.89 MIL/uL (ref 3.87–5.11)
RDW: 12.5 % (ref 11.5–15.5)
WBC: 6.7 10*3/uL (ref 4.0–10.5)
nRBC: 0 % (ref 0.0–0.2)

## 2020-06-13 LAB — PREGNANCY, URINE: Preg Test, Ur: NEGATIVE

## 2020-06-13 LAB — COMPREHENSIVE METABOLIC PANEL
ALT: 64 U/L — ABNORMAL HIGH (ref 0–44)
AST: 32 U/L (ref 15–41)
Albumin: 4.5 g/dL (ref 3.5–5.0)
Alkaline Phosphatase: 72 U/L (ref 38–126)
Anion gap: 12 (ref 5–15)
BUN: 7 mg/dL (ref 6–20)
CO2: 21 mmol/L — ABNORMAL LOW (ref 22–32)
Calcium: 9.6 mg/dL (ref 8.9–10.3)
Chloride: 105 mmol/L (ref 98–111)
Creatinine, Ser: 0.95 mg/dL (ref 0.44–1.00)
GFR, Estimated: >60 mL/min (ref >60)
Glucose, Bld: 94 mg/dL (ref 70–99)
Potassium: 3.5 mmol/L (ref 3.5–5.1)
Sodium: 138 mmol/L (ref 135–145)
Total Bilirubin: 0.7 mg/dL (ref 0.3–1.2)
Total Protein: 8.5 g/dL — ABNORMAL HIGH (ref 6.5–8.1)

## 2020-06-13 LAB — URINALYSIS, ROUTINE W REFLEX MICROSCOPIC
Glucose, UA: NEGATIVE mg/dL
Ketones, ur: NEGATIVE mg/dL
Nitrite: NEGATIVE
Protein, ur: NEGATIVE mg/dL
Specific Gravity, Urine: >1.03 — ABNORMAL HIGH (ref 1.005–1.030)
pH: 6 (ref 5.0–8.0)

## 2020-06-13 LAB — LIPASE, BLOOD: Lipase: 22 U/L (ref 11–51)

## 2020-06-13 LAB — URINALYSIS, MICROSCOPIC (REFLEX)

## 2020-06-13 NOTE — ED Triage Notes (Signed)
Vomiting for over a week.

## 2020-06-14 ENCOUNTER — Emergency Department (HOSPITAL_BASED_OUTPATIENT_CLINIC_OR_DEPARTMENT_OTHER): Payer: Medicaid Other

## 2020-06-14 MED ORDER — KETOROLAC TROMETHAMINE 30 MG/ML IJ SOLN
30.0000 mg | Freq: Once | INTRAMUSCULAR | Status: AC
  Administered 2020-06-14: 30 mg via INTRAVENOUS
  Filled 2020-06-14: qty 1

## 2020-06-14 MED ORDER — DIPHENHYDRAMINE HCL 50 MG/ML IJ SOLN
25.0000 mg | Freq: Once | INTRAMUSCULAR | Status: AC
  Administered 2020-06-14: 25 mg via INTRAVENOUS
  Filled 2020-06-14: qty 1

## 2020-06-14 MED ORDER — PROCHLORPERAZINE EDISYLATE 10 MG/2ML IJ SOLN
10.0000 mg | Freq: Once | INTRAMUSCULAR | Status: AC
  Administered 2020-06-14: 10 mg via INTRAVENOUS
  Filled 2020-06-14: qty 2

## 2020-06-14 MED ORDER — LACTATED RINGERS IV BOLUS
1000.0000 mL | Freq: Once | INTRAVENOUS | Status: AC
  Administered 2020-06-14: 1000 mL via INTRAVENOUS

## 2020-06-14 MED ORDER — PROCHLORPERAZINE MALEATE 10 MG PO TABS: 10.0000 mg | ORAL_TABLET | Freq: Four times a day (QID) | ORAL | 0 refills | Status: DC | PRN

## 2020-06-14 NOTE — ED Provider Notes (Signed)
[place] EMERGENCY DEPARTMENT Provider Note   CSN: [id number] Arrival date & time: 06/13/20  1824   History Chief Complaint  Patient presents with   Emesis    Rebecca Day is a 34 y.o. female.  The history is provided by the patient.  Emesis She has questionable history of lupus, history of kidney stones and comes in complaining of nausea and vomiting over the last 10 days.  During this time, she states she has not been able to hold anything down.  She denies fever, chills, sweats.  She has had some left flank pain and was worried that she might have another kidney stone.  She also is worried she might have a UTI as she did have some dysuria.  3 days ago, she started having a bifrontal headache which she describes as throbbing and severe.  There has been some photophobia monitor.  She states she has not urinated over the last 24 hours.  She continues to have left flank pain.  Past Medical History:  Diagnosis Date   Allergy    Anxiety    Asthma    Blood transfusion without reported diagnosis    as child    Clotting disorder ([place] )    blood clot after last pregnancy    Elevated blood sugar    Kidney stones    Lupus ([place])    questionable, no meds or treatment per pt   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[place])    Ovarian cyst     Patient Active Problem List   Diagnosis Date Noted   Insulin resistance 05/05/2020   Class 3 severe obesity due to excess calories without serious comorbidity with body mass index (BMI) of 45.0 to 49.9 in adult ([place]) 05/05/2020   Left lower quadrant abdominal pain 12/21/2018   Nausea 12/21/2018   History of blood clots 12/05/2018   Muscle ache 12/05/2018   Abnormal CT of liver 12/05/2018   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   Lupus ([place]) 11/02/2018   Flank pain 11/02/2018   Depression 11/02/2018   History of hematuria 11/02/2018   Family history of spina bifida 07/06/2011    Past Surgical History:  Procedure  Laterality Date   CESAREAN SECTION     C/S x 2   PILONIDAL CYST DRAINAGE       OB History    Gravida  55   Para  4   Term  3   Preterm  0   AB  6   Living  2     SAB  6   TAB  0   Ectopic  0   Multiple  0   Live Births  2           Family History  Problem Relation Age of Onset   Hypertension Mother    Colon polyps Mother    Cancer Father        dx'd in 66's   Colon cancer Father    Kidney disease Brother    Liver disease Brother    Cancer Brother    Hyperlipidemia Maternal Grandmother    Colon cancer Maternal Aunt    Colon cancer Other    Esophageal cancer Neg Hx    Rectal cancer Neg Hx    Stomach cancer Neg Hx     Social History   Tobacco Use   Smoking status: Never Smoker   Smokeless tobacco: Never Used  Vaping Use   Vaping Use: Never used  Substance Use Topics   Alcohol  use: Yes    Comment: socially   Drug use: No    Home Medications Prior to Admission medications   Medication Sig Start Date End Date Taking? Authorizing Provider  acetaminophen (TYLENOL) 500 MG tablet Take 1,000 mg by mouth every 6 (six) hours as needed for moderate pain.    [provider]  ibuprofen (ADVIL) 600 MG tablet Take 1 tablet (600 mg total) by mouth every 6 (six) hours as needed. Patient not taking: Reported on 05/09/2020 09/12/19   Rosemarie Ax, MD  megestrol (MEGACE) 40 MG tablet Take 1 tablet (40 mg total) by mouth daily. Can increase to two tablets twice a day in the event of heavy bleeding 05/09/20   Truett Mainland, DO  ondansetron (ZOFRAN-ODT) 8 MG disintegrating tablet Take 1 tablet (8 mg total) by mouth every 8 (eight) hours as needed for nausea or vomiting. 7/89/38   Delora Fuel, MD  triamterene-hydrochlorothiazide [facility]) 37.5-25 MG tablet 1- 1/2  a day prn edema Patient not taking: Reported on 05/01/2020 09/06/19   Rodriguez-[place], Sunday Spillers, PA-C    Allergies    Penicillins  Review of Systems   Review of  Systems  Gastrointestinal: Positive for vomiting.  All other systems reviewed and are negative.   Physical Exam Updated Vital Signs BP 130/77 (BP Location: Right Arm)    Pulse 72    Temp 98.2 F (36.8 C) (Oral)    Resp 18    Ht 5\' 10"  (1.778 m)    Wt (!) 138.8 kg    SpO2 98%    BMI 43.91 kg/m   Physical Exam Vitals and nursing note reviewed.   34 year old female, resting comfortably and in no acute distress. Vital signs are normal. Oxygen saturation is 98%, which is normal. Head is normocephalic and atraumatic. PERRLA, EOMI. Oropharynx is clear. Neck is nontender and supple without adenopathy or JVD. Back is nontender in the midline.  There is moderate left CVA tenderness. Lungs are clear without rales, wheezes, or rhonchi. Chest is nontender. Heart has regular rate and rhythm without murmur. Abdomen is soft, flat, with mild left lower quadrant tenderness.  There is no rebound or guarding.  There are no masses or hepatosplenomegaly and peristalsis is hypoactive. Extremities have no cyanosis or edema, full range of motion is present. Skin is warm and dry without rash. Neurologic: Mental status is normal, cranial nerves are intact, there are no motor or sensory deficits.  ED Results / Procedures / Treatments   Labs (all labs ordered are listed, but only abnormal results are displayed) Labs Reviewed  URINALYSIS, ROUTINE W REFLEX MICROSCOPIC - Abnormal; Notable for the following components:      Result Value   APPearance CLOUDY (*)    Specific Gravity, Urine >1.030 (*)    Hgb urine dipstick SMALL (*)    Bilirubin Urine SMALL (*)    Leukocytes,Ua SMALL (*)    All other components within normal limits  COMPREHENSIVE METABOLIC PANEL - Abnormal; Notable for the following components:   CO2 21 (*)    Total Protein 8.5 (*)    ALT 64 (*)    All other components within normal limits  URINALYSIS, MICROSCOPIC (REFLEX) - Abnormal; Notable for the following components:   Bacteria, UA MANY  (*)    All other components within normal limits  LIPASE, BLOOD  CBC  PREGNANCY, URINE    EKG None  Radiology No results found.  Procedures Procedures   Medications Ordered in ED Medications - No  data to display  ED Course  I have reviewed the triage vital signs and the nursing notes.  Pertinent labs & imaging results that were available during my care of the patient were reviewed by me and considered in my medical decision making (see chart for details).  MDM Rules/Calculators/A&P Persistent nausea and vomiting.  Headache which has characteristics suggestive of migraine, possibly brought on by dehydration.  Flank pain consider UTI, pyelonephritis, kidney stone.  Old records are reviewed, and she does have a prior visits for kidney stones.  Labs are reassuring.  No elevation of BUN and creatinine.  Normal WBC.  Urinalysis does show high specific gravity consistent with dehydration, is a contaminated specimen so cannot adequately assess for bacteria and WBCs.  She will be sent for renal stone protocol CT scan and started on IV fluids, prochlorperazine, diphenhydramine, ketorolac.  CT scan shows no acute process.  Specifically, no evidence of urolithiasis and no evidence of pyelonephritis.  Following above-noted treatment, patient was feeling much better.  She is discharged with prescription for prochlorperazine and advised use over-the-counter NSAIDs as needed for pain.  Return precautions discussed.  Final Clinical Impression(s) / ED Diagnoses Final diagnoses:  Non-intractable vomiting with nausea, unspecified vomiting type  Left flank pain  Elevated ALT measurement  Bad headache    Rx / DC Orders ED Discharge Orders         Ordered    prochlorperazine (COMPAZINE) 10 MG tablet  Every 6 hours PRN        06/14/20 1655           Delora Fuel, MD 37/48/27 [phone]

## 2020-06-14 NOTE — Discharge Instructions (Addendum)
Your urine did not show any sign of infection, and your CT scan did not show any signs of kidney stones.  Take ibuprofen or naproxen as needed for pain.  You may add acetaminophen as needed for additional pain relief.  Return if you are having any problems.

## 2020-06-14 NOTE — ED Notes (Signed)
Patient transported to CT 

## 2020-06-14 NOTE — ED Notes (Signed)
No vomiting noted; patient co headache; warm blanket given; lights dimmed.

## 2020-06-24 MED ORDER — MEGESTROL ACETATE 40 MG PO TABS: 80.0000 mg | ORAL_TABLET | Freq: Two times a day (BID) | ORAL | 3 refills | Status: AC

## 2020-06-24 NOTE — Addendum Note (Signed)
Addended by: Truett Mainland on: 06/24/2020 03:21 PM   Modules accepted: Orders

## 2020-08-21 ENCOUNTER — Encounter: Payer: Self-pay | Admitting: Family Medicine

## 2020-08-21 ENCOUNTER — Other Ambulatory Visit: Payer: Self-pay

## 2020-08-21 ENCOUNTER — Ambulatory Visit (INDEPENDENT_AMBULATORY_CARE_PROVIDER_SITE_OTHER): Payer: Medicaid Other | Admitting: Family Medicine

## 2020-08-21 VITALS — BP 119/69 | HR 98 | Ht 70.0 in | Wt 287.0 lb

## 2020-08-21 DIAGNOSIS — R1032 Left lower quadrant pain: Secondary | ICD-10-CM | POA: Diagnosis not present

## 2020-08-21 DIAGNOSIS — R2231 Localized swelling, mass and lump, right upper limb: Secondary | ICD-10-CM | POA: Diagnosis not present

## 2020-08-21 DIAGNOSIS — Z86718 Personal history of other venous thrombosis and embolism: Secondary | ICD-10-CM | POA: Diagnosis not present

## 2020-08-21 DIAGNOSIS — F3281 Premenstrual dysphoric disorder: Secondary | ICD-10-CM

## 2020-08-21 MED ORDER — MEGESTROL ACETATE 40 MG PO TABS: 80.0000 mg | ORAL_TABLET | Freq: Two times a day (BID) | ORAL | 3 refills | Status: DC

## 2020-08-21 NOTE — Progress Notes (Signed)
   Subjective:    Patient ID: Rebecca Day, female    DOB: February 21, 1986, 34 y.o.   MRN: [id number]  HPI Patient seen for follow up of left pelvic pain and AUB. The increased dose of megace has controlled both the pain as well as her PMDD. She has noticed side effects of intermittent nausea and intermittent headache, but feels that these are tolerable. Her bleeding has completely stopped.   Has noticed tender area in her right armpit.    Review of Systems     Objective:   Physical Exam Vitals reviewed.  Chest:    Abdominal:     General: Abdomen is flat.     Palpations: Abdomen is soft. There is no shifting dullness or hepatomegaly.     Tenderness: There is abdominal tenderness (mild) in the left lower quadrant. There is no guarding or rebound.  Neurological:     Mental Status: She is alert.        Assessment & Plan:  1. Left lower quadrant abdominal pain Continue megace. If continues to be stable, then may consider decreasing dose in 3 months  2. History of blood clots Estrogen hormones contraindicated  3. PMDD (premenstrual dysphoric disorder) improved  4. Axillary lump, right Had recent breast exam, which was normal. Uncertain if this is a lymph node or early abscess. Will watch closely and recheck at next appt.

## 2020-09-22 ENCOUNTER — Encounter (HOSPITAL_BASED_OUTPATIENT_CLINIC_OR_DEPARTMENT_OTHER): Payer: Self-pay | Admitting: Emergency Medicine

## 2020-09-22 ENCOUNTER — Other Ambulatory Visit: Payer: Self-pay

## 2020-09-22 ENCOUNTER — Emergency Department (HOSPITAL_BASED_OUTPATIENT_CLINIC_OR_DEPARTMENT_OTHER)
Admission: EM | Admit: 2020-09-22 | Discharge: 2020-09-22 | Disposition: A | Discharge status: Home / Self Care | Payer: Medicaid Other | Attending: Emergency Medicine | Admitting: Emergency Medicine

## 2020-09-22 DIAGNOSIS — R2231 Localized swelling, mass and lump, right upper limb: Secondary | ICD-10-CM | POA: Diagnosis present

## 2020-09-22 DIAGNOSIS — M79621 Pain in right upper arm: Secondary | ICD-10-CM | POA: Diagnosis not present

## 2020-09-22 DIAGNOSIS — J45909 Unspecified asthma, uncomplicated: Secondary | ICD-10-CM | POA: Diagnosis not present

## 2020-09-22 NOTE — ED Provider Notes (Signed)
[facility] EMERGENCY DEPARTMENT Provider Note   CSN: [id number] Arrival date & time: 09/22/20  0841     History Chief Complaint  Patient presents with  . Arm Swelling    Right Axilla    Rebecca Day is a 35 y.o. female.  The history is provided by the patient.  Illness Location:  Right axilla Quality:  Pain Severity:  Mild Onset quality:  Gradual Timing:  Intermittent Progression:  Waxing and waning Chronicity:  New Relieved by:  Motrin Worsened by:  Movement Associated symptoms: no abdominal pain, no chest pain, no congestion, no diarrhea, no fever, no nausea, no rash and no shortness of breath        Past Medical History:  Diagnosis Date  . Allergy   . Anxiety   . Asthma   . Blood transfusion without reported diagnosis    as child   . Clotting disorder ([place])    blood clot after last pregnancy   . Elevated blood sugar   . Kidney stones   . Lupus ([place])    questionable, no meds or treatment per pt  . 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[place])   . Ovarian cyst   . PMDD (premenstrual dysphoric disorder)     Patient Active Problem List   Diagnosis Date Noted  . 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 Class 3 severe obesity due to excess calories without serious comorbidity with body mass index (BMI) of 45.0 to 49.9 in adult ([place]) 05/05/2020  . Left lower quadrant abdominal pain 12/21/2018  . Nausea 12/21/2018  . History of blood clots 12/05/2018  . Muscle ache 12/05/2018  . Abnormal CT of liver 12/05/2018  . 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 Lupus ([place]) 11/02/2018  . Flank pain 11/02/2018  . Depression 11/02/2018  . History of hematuria 11/02/2018  . Family history of spina bifida 07/06/2011    Past Surgical History:  Procedure Laterality Date  . CESAREAN SECTION     C/S x 2  . PILONIDAL CYST DRAINAGE       OB History    Gravida  11   Para  4   Term  3   Preterm  0   AB  6   Living  2     SAB  6   IAB  0   Ectopic  0   Multiple   0   Live Births  2           Family History  Problem Relation Age of Onset  . Hypertension Mother   . Colon polyps Mother   . Cancer Father        dx'd in 2's  . Colon cancer Father   . Kidney disease Brother   . Liver disease Brother   . Cancer Brother   . Hyperlipidemia Maternal Grandmother   . Colon cancer Maternal Aunt   . Colon cancer Other   . Esophageal cancer Neg Hx   . Rectal cancer Neg Hx   . Stomach cancer Neg Hx     Social History   Tobacco Use  . Smoking status: Never Smoker  . Smokeless tobacco: Never Used  Vaping Use  . Vaping Use: Never used  Substance Use Topics  . Alcohol use: Yes    Comment: socially  . Drug use: No    Home Medications Prior to Admission medications   Medication Sig Start Date End Date Taking? Authorizing Provider  acetaminophen (TYLENOL) 500 MG tablet Take 1,000 mg by mouth every 6 (  six) hours as needed for moderate pain.    [provider]  ibuprofen (ADVIL) 600 MG tablet Take 1 tablet (600 mg total) by mouth every 6 (six) hours as needed. Patient not taking: No sig reported 09/12/19   Rosemarie Ax, MD  megestrol (MEGACE) 40 MG tablet Take 2 tablets (80 mg total) by mouth 2 (two) times daily. Can increase to two tablets twice a day in the event of heavy bleeding 08/21/20 11/19/20  Truett Mainland, DO  ondansetron (ZOFRAN-ODT) 8 MG disintegrating tablet Take 1 tablet (8 mg total) by mouth every 8 (eight) hours as needed for nausea or vomiting. Patient not taking: Reported on [id number]   Delora Fuel, MD  prochlorperazine (COMPAZINE) 10 MG tablet Take 1 tablet (10 mg total) by mouth every 6 (six) hours as needed for nausea or vomiting. Patient not taking: Reported on [id number]   Delora Fuel, MD  triamterene-hydrochlorothiazide [facility]) 37.5-25 MG tablet 1- 1/2  a day prn edema Patient not taking: No sig reported 09/06/19   Rodriguez-Southworth, Sunday Spillers, PA-C    Allergies     Penicillins  Review of Systems   Review of Systems  Constitutional: Negative for fever.  HENT: Negative for congestion.   Respiratory: Negative for shortness of breath.   Cardiovascular: Negative for chest pain.  Gastrointestinal: Negative for abdominal pain, diarrhea and nausea.  Musculoskeletal: Positive for arthralgias (right axilla pain). Negative for neck pain and neck stiffness.  Skin: Negative for color change, pallor, rash and wound.  Neurological: Negative for weakness and numbness.    Physical Exam Updated Vital Signs BP (!) 138/96   Temp 98.3 F (36.8 C) (Oral)   Resp 16   Ht 5\' 9"  (1.753 m)   Wt 131.5 kg   SpO2 99%   BMI 42.83 kg/m   Physical Exam Constitutional:      General: She is not in acute distress.    Appearance: She is not ill-appearing.  HENT:     Head: Normocephalic and atraumatic.  Cardiovascular:     Pulses: Normal pulses.  Musculoskeletal:        General: Tenderness (TTP in right axilla) present. Normal range of motion.  Skin:    Capillary Refill: Capillary refill takes less than 2 seconds.     Findings: No erythema or rash.     Comments: No obvious warmth, fluctuance, lymphadenopathy in the right axilla  Neurological:     Mental Status: She is alert.     ED Results / Procedures / Treatments   Labs (all labs ordered are listed, but only abnormal results are displayed) Labs Reviewed - No data to display  EKG None  Radiology No results found.  Procedures Procedures (including critical care time)  Medications Ordered in ED Medications - No data to display  ED Course  I have reviewed the triage vital signs and the nursing notes.  Pertinent labs & imaging results that were available during my care of the patient were reviewed by me and considered in my medical decision making (see chart for details).    MDM Rules/Calculators/A&P                          Rebecca Day is a 35 year old female who presents the ED with pain in  the right axilla.  Normal vitals.  No fever.  Feels like she is having a burning type pain in the right axilla.  No signs of infection or inflammatory process  that is obvious.  This has been ongoing for several months on and off.  No obvious lymphadenopathy.  Could be signs of an early abscess given the pain or could be an irritant from deodorant or some other process.  Recommend that she continue to use Motrin and Tylenol as needed as this could be an inflammatory process/MSK process.  Recommend warm compresses as needed.  Educated that if it appears that it develops into an abscess to return.  However can follow-up with primary care doctor/OB/GYN who has seen her in the past for the same and consider possible ultrasound.  Discharged in good condition.  Understands return precautions.  This chart was dictated using voice recognition software.  Despite best efforts to proofread,  errors can occur which can change the documentation meaning.   Final Clinical Impression(s) / ED Diagnoses Final diagnoses:  Pain in right axilla    Rx / DC Orders ED Discharge Orders    None       Lennice Sites, DO 09/22/20 1011

## 2020-09-22 NOTE — ED Notes (Signed)
Chaperoned MD for exam

## 2020-09-22 NOTE — ED Notes (Addendum)
Pt left with out d/c instructions stating that she did not feel well and wanted to leave   I did ask her if she had a gyn she could f/u with and she states she did but did not want to stay ,  Pt states she did not feel like dr listened to her . I asked if she  Wanted to come back in and explain again to dr and she states NO , she just wants to leave , pt was outside when I spoke to her as she was getting  In her car  So no updated vitals

## 2020-09-22 NOTE — ED Triage Notes (Signed)
Pt has felt a lump under right arm since November.  Recently she feels like it has gotten larger.  Pt relates her shoulder is causing pain since last night.

## 2020-09-22 NOTE — Discharge Instructions (Addendum)
Follow-up with your primary care doctor/OB/GYN.  Continue Motrin, warm compresses.  Return if you see any signs of an abscess develop as discussed.

## 2020-10-02 ENCOUNTER — Encounter: Payer: Self-pay | Admitting: Family Medicine

## 2020-10-02 ENCOUNTER — Ambulatory Visit: Payer: Medicaid Other | Admitting: Family Medicine

## 2020-10-02 ENCOUNTER — Other Ambulatory Visit: Payer: Self-pay

## 2020-10-02 VITALS — BP 109/90 | HR 107

## 2020-10-02 DIAGNOSIS — N644 Mastodynia: Secondary | ICD-10-CM | POA: Diagnosis not present

## 2020-10-02 NOTE — Progress Notes (Signed)
   Subjective:    Patient ID: Rebecca Day, female    DOB: [birth year], 35 y.o.   MRN: [id number]  HPI Patient seen for burning pain and sensitivity on lateral side of right breast. Started a few weeks ago. Did become red and warm, but appears to be improving after taking ibuprofen for a few days. Does have bump in right armpit that I evaluated about a month ago. Still present and not painful. No drainage.   Review of Systems     Objective:   Physical Exam Vitals reviewed. Exam conducted with a chaperone present.  Constitutional:      Appearance: Normal appearance.  Chest:  Breasts:     Right: Normal. No swelling, inverted nipple, mass, nipple discharge, skin change or tenderness.     Left: Normal. No swelling, inverted nipple, mass, nipple discharge, skin change or tenderness.        Comments: No breast lumps. No erythema to lateral wall of breast. Slightly tender Neurological:     Mental Status: She is alert.        Assessment & Plan:  1. Breast pain Appears improved. Axillary lump without change. Will get [place] of breast to r/o abnormality. - US BREAST LTD UNI RIGHT INC AXILLA; Future

## 2020-10-02 NOTE — Progress Notes (Signed)
Patient following up on her right side breast pain. Patient states some days she has a burning sensation and cant put on her bra. Kathrene Alu RN

## 2020-10-03 ENCOUNTER — Other Ambulatory Visit: Payer: Self-pay | Admitting: Family Medicine

## 2020-10-03 ENCOUNTER — Telehealth: Payer: Self-pay | Admitting: General Practice

## 2020-10-03 DIAGNOSIS — N63 Unspecified lump in unspecified breast: Secondary | ICD-10-CM

## 2020-10-03 DIAGNOSIS — N644 Mastodynia: Secondary | ICD-10-CM

## 2020-10-03 NOTE — Telephone Encounter (Signed)
Left message informing patient of breast US with [facility] on 11/14/2020 at 10:00am.

## 2020-11-14 ENCOUNTER — Other Ambulatory Visit: Payer: Self-pay | Admitting: Family Medicine

## 2020-11-14 ENCOUNTER — Ambulatory Visit
Admission: RE | Admit: 2020-11-14 | Discharge: 2020-11-14 | Disposition: A | Discharge status: Home / Self Care | Payer: Medicaid Other | Source: Ambulatory Visit | Attending: Family Medicine | Admitting: Family Medicine

## 2020-11-14 ENCOUNTER — Other Ambulatory Visit: Payer: Self-pay

## 2020-11-14 DIAGNOSIS — N644 Mastodynia: Secondary | ICD-10-CM

## 2020-11-14 DIAGNOSIS — N63 Unspecified lump in unspecified breast: Secondary | ICD-10-CM

## 2021-01-02 ENCOUNTER — Encounter (HOSPITAL_BASED_OUTPATIENT_CLINIC_OR_DEPARTMENT_OTHER): Payer: Self-pay

## 2021-01-02 ENCOUNTER — Emergency Department (HOSPITAL_BASED_OUTPATIENT_CLINIC_OR_DEPARTMENT_OTHER)
Admission: EM | Admit: 2021-01-02 | Discharge: 2021-01-02 | Disposition: A | Discharge status: Home / Self Care | Payer: Medicaid Other | Attending: Emergency Medicine | Admitting: Emergency Medicine

## 2021-01-02 ENCOUNTER — Other Ambulatory Visit: Payer: Self-pay

## 2021-01-02 DIAGNOSIS — J45909 Unspecified asthma, uncomplicated: Secondary | ICD-10-CM | POA: Insufficient documentation

## 2021-01-02 DIAGNOSIS — L02215 Cutaneous abscess of perineum: Secondary | ICD-10-CM | POA: Insufficient documentation

## 2021-01-02 DIAGNOSIS — R1907 Generalized intra-abdominal and pelvic swelling, mass and lump: Secondary | ICD-10-CM | POA: Diagnosis present

## 2021-01-02 DIAGNOSIS — L0291 Cutaneous abscess, unspecified: Secondary | ICD-10-CM

## 2021-01-02 MED ORDER — DOXYCYCLINE HYCLATE 100 MG PO CAPS
100.0000 mg | ORAL_CAPSULE | Freq: Two times a day (BID) | ORAL | 0 refills | Status: DC
Start: 2021-01-02 — End: 2021-07-15

## 2021-01-02 NOTE — ED Provider Notes (Signed)
[facility] EMERGENCY DEPARTMENT Provider Note   CSN: [id number] Arrival date & time: 01/02/21  1858     History Chief Complaint  Patient presents with  . Abscess    Rebecca Day is a 35 y.o. female.  35 year old female with past medical history below who presents with skin infection.  Approximately 1 week ago, patient noticed a sore area on her mons pubis that she thought was just a minor ingrown hair.  She has had progressively worsening pain and swelling in this area.  She reports history of skin abscess on other areas of her body.  She denies any urinary symptoms or vaginal bleeding/discharge.  T-max 99.6. No recent shaving/waxing.  The history is provided by the patient.  Abscess      Past Medical History:  Diagnosis Date  . Allergy   . Anxiety   . Asthma   . Blood transfusion without reported diagnosis    as child   . Clotting disorder ([place])    blood clot after last pregnancy   . Elevated blood sugar   . Kidney stones   . Lupus ([place])    questionable, no meds or treatment per pt  . 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[place])   . Ovarian cyst   . PMDD (premenstrual dysphoric disorder)     Patient Active Problem List   Diagnosis Date Noted  . 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 Class 3 severe obesity due to excess calories without serious comorbidity with body mass index (BMI) of 45.0 to 49.9 in adult ([place]) 05/05/2020  . Left lower quadrant abdominal pain 12/21/2018  . Nausea 12/21/2018  . History of blood clots 12/05/2018  . Muscle ache 12/05/2018  . Abnormal CT of liver 12/05/2018  . 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 Lupus ([place]) 11/02/2018  . Flank pain 11/02/2018  . Depression 11/02/2018  . History of hematuria 11/02/2018  . Family history of spina bifida 07/06/2011    Past Surgical History:  Procedure Laterality Date  . CESAREAN SECTION     C/S x 2  . PILONIDAL CYST DRAINAGE       OB History    Gravida  11   Para  4   Term  3   Preterm   0   AB  6   Living  2     SAB  6   IAB  0   Ectopic  0   Multiple  0   Live Births  2           Family History  Problem Relation Age of Onset  . Hypertension Mother   . Colon polyps Mother   . Cancer Father        dx'd in 43's  . Colon cancer Father   . Kidney disease Brother   . Liver disease Brother   . Cancer Brother   . Hyperlipidemia Maternal Grandmother   . Colon cancer Maternal Aunt   . Colon cancer Other   . Esophageal cancer Neg Hx   . Rectal cancer Neg Hx   . Stomach cancer Neg Hx     Social History   Tobacco Use  . Smoking status: Never Smoker  . Smokeless tobacco: Never Used  Vaping Use  . Vaping Use: Never used  Substance Use Topics  . Alcohol use: Yes    Comment: socially  . Drug use: No    Home Medications Prior to Admission medications   Medication Sig Start Date End Date Taking? Authorizing Provider  doxycycline (VIBRAMYCIN) 100 MG capsule Take 1 capsule (100 mg total) by mouth 2 (two) times daily. 01/02/21  Yes Taran Hable, Wenda Overland, MD  acetaminophen (TYLENOL) 500 MG tablet Take 1,000 mg by mouth every 6 (six) hours as needed for moderate pain.    [provider]  ibuprofen (ADVIL) 600 MG tablet Take 1 tablet (600 mg total) by mouth every 6 (six) hours as needed. Patient not taking: No sig reported 09/12/19   Rosemarie Ax, MD  ondansetron (ZOFRAN-ODT) 8 MG disintegrating tablet Take 1 tablet (8 mg total) by mouth every 8 (eight) hours as needed for nausea or vomiting. Patient not taking: No sig reported 3/81/01   Delora Fuel, MD  prochlorperazine (COMPAZINE) 10 MG tablet Take 1 tablet (10 mg total) by mouth every 6 (six) hours as needed for nausea or vomiting. Patient not taking: No sig reported 75/10/25   Delora Fuel, MD  triamterene-hydrochlorothiazide [facility]) 37.5-25 MG tablet 1- 1/2  a day prn edema Patient not taking: No sig reported 09/06/19   Rodriguez-Southworth, Sunday Spillers, PA-C    Allergies     Penicillins  Review of Systems   Review of Systems All other systems reviewed and are negative except that which was mentioned in HPI  Physical Exam Updated Vital Signs BP (!) 134/100 (BP Location: Left Arm)   Pulse 97   Temp 98.3 F (36.8 C) (Oral)   Resp 16   Ht 5\' 9"  (1.753 m)   Wt 131.5 kg   SpO2 100%   BMI 42.83 kg/m   Physical Exam Vitals and nursing note reviewed. Exam conducted with a chaperone present.  Constitutional:      General: She is not in acute distress.    Appearance: She is well-developed.  HENT:     Head: Normocephalic and atraumatic.  Eyes:     Conjunctiva/sclera: Conjunctivae normal.  Genitourinary:    Comments: Area of central induration and fluctuance and surrounding erythema on mons pubis, does not extend to labia; no crepitus Musculoskeletal:     Cervical back: Neck supple.  Skin:    General: Skin is warm and dry.  Neurological:     Mental Status: She is alert and oriented to person, place, and time.  Psychiatric:        Judgment: Judgment normal.     ED Results / Procedures / Treatments   Labs (all labs ordered are listed, but only abnormal results are displayed) Labs Reviewed - No data to display  EKG None  Radiology No results found.  Procedures .Marland KitchenIncision and Drainage  Date/Time: 01/02/2021 8:01 PM Performed by: Sharlett Iles, MD Authorized by: Sharlett Iles, MD   Consent:    Consent obtained:  Verbal   Consent given by:  Patient   Alternatives discussed:  No treatment Universal protocol:    Patient identity confirmed:  Verbally with patient Location:    Type:  Abscess   Size:  1 cm   Location:  Anogenital   Anogenital location: mons pubis. Pre-procedure details:    Skin preparation:  Povidone-iodine Sedation:    Sedation type:  None Anesthesia:    Anesthesia method:  Local infiltration   Local anesthetic:  Lidocaine 1% WITH epi Procedure type:    Complexity:  Simple Procedure details:     Incision types:  Single straight   Incision depth:  Dermal   Wound management:  Probed and deloculated   Drainage:  Purulent   Drainage amount:  Moderate   Wound treatment:  Wound left  open   Packing materials:  1/4 in iodoform gauze Post-procedure details:    Procedure completion:  Tolerated well, no immediate complications     Medications Ordered in ED Medications - No data to display  ED Course  I have reviewed the triage vital signs and the nursing notes.      MDM Rules/Calculators/A&P                          No signs/sx of Fournier's gangrene, denies h/o diabetes. Exam c/w central abscess, surrounding cellulitis.  I&D at bedside, appears consistent with infected hair follicle.  Placed small amount of packing to ensure adequate drainage.  Discussed wound management and prescribe doxycycline to cover for MRSA.  I have emphasized need to return immediately if infection appears to be worsening instead of improving.  She voiced understanding. Final Clinical Impression(s) / ED Diagnoses Final diagnoses:  Abscess    Rx / DC Orders ED Discharge Orders         Ordered    doxycycline (VIBRAMYCIN) 100 MG capsule  2 times daily        01/02/21 1946           Makeyla Govan, Wenda Overland, MD 01/02/21 2006

## 2021-01-02 NOTE — ED Triage Notes (Signed)
Pt c/o abscess "to pubic area" x 1 week-NAD-steady gait

## 2021-05-20 ENCOUNTER — Other Ambulatory Visit: Payer: Self-pay

## 2021-05-20 ENCOUNTER — Ambulatory Visit
Admission: RE | Admit: 2021-05-20 | Discharge: 2021-05-20 | Disposition: A | Discharge status: Home / Self Care | Payer: Medicaid Other | Source: Ambulatory Visit | Attending: Family Medicine | Admitting: Family Medicine

## 2021-05-20 ENCOUNTER — Other Ambulatory Visit: Payer: Self-pay | Admitting: Family Medicine

## 2021-05-20 DIAGNOSIS — N63 Unspecified lump in unspecified breast: Secondary | ICD-10-CM

## 2021-05-20 DIAGNOSIS — N644 Mastodynia: Secondary | ICD-10-CM

## 2021-07-15 ENCOUNTER — Ambulatory Visit (INDEPENDENT_AMBULATORY_CARE_PROVIDER_SITE_OTHER): Payer: Medicaid Other | Admitting: Nurse Practitioner

## 2021-07-15 ENCOUNTER — Other Ambulatory Visit: Payer: Self-pay

## 2021-07-15 ENCOUNTER — Ambulatory Visit
Admission: RE | Admit: 2021-07-15 | Discharge: 2021-07-15 | Disposition: A | Discharge status: Home / Self Care | Payer: Medicaid Other | Source: Ambulatory Visit | Attending: Nurse Practitioner | Admitting: Nurse Practitioner

## 2021-07-15 ENCOUNTER — Encounter: Payer: Self-pay | Admitting: Nurse Practitioner

## 2021-07-15 ENCOUNTER — Other Ambulatory Visit: Payer: Self-pay | Admitting: Nurse Practitioner

## 2021-07-15 VITALS — BP 138/88 | HR 82 | Temp 99.1°F | Ht 67.0 in | Wt 312.2 lb

## 2021-07-15 DIAGNOSIS — G8929 Other chronic pain: Secondary | ICD-10-CM

## 2021-07-15 DIAGNOSIS — Z23 Encounter for immunization: Secondary | ICD-10-CM | POA: Diagnosis not present

## 2021-07-15 DIAGNOSIS — E7800 Pure hypercholesterolemia, unspecified: Secondary | ICD-10-CM | POA: Diagnosis not present

## 2021-07-15 DIAGNOSIS — M25562 Pain in left knee: Secondary | ICD-10-CM | POA: Diagnosis not present

## 2021-07-15 DIAGNOSIS — Z6841 Body Mass Index (BMI) 40.0 and over, adult: Secondary | ICD-10-CM

## 2021-07-15 DIAGNOSIS — M546 Pain in thoracic spine: Secondary | ICD-10-CM | POA: Diagnosis not present

## 2021-07-15 DIAGNOSIS — R7309 Other abnormal glucose: Secondary | ICD-10-CM
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
## 2021-07-15 NOTE — Patient Instructions (Signed)
Acute Knee Pain, Adult °Many things can cause knee pain. Sometimes, knee pain is sudden (acute) and may be caused by damage, swelling, or irritation of the muscles and tissues that support your knee. °The pain often goes away on its own with time and rest. If the pain does not go away, tests may be done to find out what is causing the pain. °Follow these instructions at home: °If you have a knee sleeve or brace: ° °Wear the knee sleeve or brace as told by your doctor. Take it off only as told by your doctor. °Loosen it if your toes: °Tingle. °Become numb. °Turn cold and blue. °Keep it clean. °If the knee sleeve or brace is not waterproof: °Do not let it get wet. °Cover it with a watertight covering when you take a bath or shower. °Activity °Rest your knee. °Do not do things that cause pain or make pain worse. °Avoid activities where both feet leave the ground at the same time (high-impact activities). Examples are running, jumping rope, and doing jumping jacks. °Work with a physical therapist to make a safe exercise program, as told by your doctor. °Managing pain, stiffness, and swelling ° °If told, put ice on the knee. To do this: °If you have a removable knee sleeve or brace, take it off as told by your doctor. °Put ice in a plastic bag. °Place a towel between your skin and the bag. °Leave the ice on for 20 minutes, 2-3 times a day. °Take off the ice if your skin turns bright red. This is very important. If you cannot feel pain, heat, or cold, you have a greater risk of damage to the area. °If told, use an elastic bandage to put pressure (compression) on your injured knee. °Raise your knee above the level of your heart while you are sitting or lying down. °Sleep with a pillow under your knee. °General instructions °Take over-the-counter and prescription medicines only as told by your doctor. °Do not smoke or use any products that contain nicotine or tobacco. If you need help quitting, ask your doctor. °If you are  overweight, work with your doctor and a food expert (dietitian) to set goals to lose weight. Being overweight can make your knee hurt more. °Watch for any changes in your symptoms. °Keep all follow-up visits. °Contact a doctor if: °The knee pain does not stop. °The knee pain changes or gets worse. °You have a fever along with knee pain. °Your knee is red or feels warm when you touch it. °Your knee gives out or locks up. °Get help right away if: °Your knee swells, and the swelling gets worse. °You cannot move your knee. °You have very bad knee pain that does not get better with pain medicine. °Summary °Many things can cause knee pain. The pain often goes away on its own with time and rest. °Your doctor may do tests to find out the cause of the pain. °Watch for any changes in your symptoms. Relieve your pain with rest, medicines, light activity, and use of ice. °Get help right away if you cannot move your knee or your knee pain is very bad. °This information is not intended to replace advice given to you by your health care provider. Make sure you discuss any questions you have with your health care provider. °Document Revised: 01/30/2020 Document Reviewed: 01/30/2020 °Elsevier Patient Education © 2022 [employer] ° °

## 2021-07-16 LAB — LIPID PANEL
Chol/HDL Ratio: 5.4 ratio — ABNORMAL HIGH (ref 0.0–4.4)
Cholesterol, Total: 236 mg/dL — ABNORMAL HIGH (ref 100–199)
HDL: 44 mg/dL (ref >39)
LDL Chol Calc (NIH): 171 mg/dL — ABNORMAL HIGH (ref 0–99)
Triglycerides: 115 mg/dL (ref 0–149)
VLDL Cholesterol Cal: 21 mg/dL (ref 5–40)

## 2021-07-16 LAB — TSH: TSH: 0.958 u[IU]/mL (ref 0.450–4.500)

## 2021-07-16 LAB — INSULIN, RANDOM: INSULIN: 756 u[IU]/mL — ABNORMAL HIGH (ref 2.6–24.9)

## 2021-07-17 ENCOUNTER — Ambulatory Visit: Payer: Medicaid Other | Admitting: Family Medicine

## 2021-07-20 ENCOUNTER — Ambulatory Visit: Payer: Medicaid Other | Admitting: Nurse Practitioner

## 2021-07-26 ENCOUNTER — Encounter: Payer: Self-pay | Admitting: Nurse Practitioner

## 2021-07-28 ENCOUNTER — Other Ambulatory Visit: Payer: Medicaid Other

## 2021-07-28 ENCOUNTER — Other Ambulatory Visit: Payer: Self-pay

## 2021-07-28 DIAGNOSIS — R7309 Other abnormal glucose: Secondary | ICD-10-CM

## 2021-07-28 LAB — HEMOGLOBIN A1C
Est. average glucose Bld gHb Est-mCnc: 123 mg/dL
Hgb A1c MFr Bld: 5.9 % — ABNORMAL HIGH (ref 4.8–5.6)

## 2021-07-29 ENCOUNTER — Encounter: Payer: Self-pay | Admitting: Nurse Practitioner

## 2021-07-30 ENCOUNTER — Encounter: Payer: Self-pay | Admitting: Family Medicine

## 2021-07-30 ENCOUNTER — Other Ambulatory Visit: Payer: Self-pay

## 2021-07-30 ENCOUNTER — Ambulatory Visit: Payer: Medicaid Other | Admitting: Family Medicine

## 2021-07-30 VITALS — BP 113/77 | HR 110 | Wt 308.0 lb

## 2021-07-30 DIAGNOSIS — N644 Mastodynia: Secondary | ICD-10-CM

## 2021-07-30 MED ORDER — GABAPENTIN 300 MG PO CAPS: 300.0000 mg | ORAL_CAPSULE | Freq: Two times a day (BID) | ORAL | 3 refills | Status: DC

## 2021-07-30 NOTE — Progress Notes (Signed)
   Subjective:    Patient ID: Rebecca Day, female    DOB: [birth year], 36 y.o.   MRN: [id number]  HPI Patient seen for follow up of breast pain. Has continued to have breast pain in her right breast. Pain is still burning in nature with hypersensitivity of lateral breast. No erythema. Did have imaging done in 10/2020 and 04/2021. Stable 53mm mass in subarealoar region and 1cm from nipple measuring 4x2x68mm that are unchanged.   Review of Systems     Objective:   Physical Exam Vitals reviewed. Exam conducted with a chaperone present.  Constitutional:      Appearance: Normal appearance.  Chest:    Neurological:     Mental Status: She is alert.       Assessment & Plan:  1. Breast pain Discussed options - decrease megace to see if improves with decreased hormones. However, patient on megace due to AUB and this is controlling those symptoms well. Had breakthrough bleeding at lower dose. Would like to start gabapentin to see if this is helpful for burning pain and hypersensitivity. Start 300mg  at night and add in AM after a week. Side effects discussed. F/u in 2-3 months.

## 2021-08-04 ENCOUNTER — Ambulatory Visit (INDEPENDENT_AMBULATORY_CARE_PROVIDER_SITE_OTHER): Payer: Medicaid Other | Admitting: Orthopaedic Surgery

## 2021-08-04 ENCOUNTER — Other Ambulatory Visit: Payer: Self-pay

## 2021-08-04 ENCOUNTER — Encounter: Payer: Self-pay | Admitting: Orthopaedic Surgery

## 2021-08-04 DIAGNOSIS — M25562 Pain in left knee: Secondary | ICD-10-CM

## 2021-08-04 DIAGNOSIS — G8929 Other chronic pain: Secondary | ICD-10-CM | POA: Diagnosis not present

## 2021-08-04 DIAGNOSIS — Z6841 Body Mass Index (BMI) 40.0 and over, adult: Secondary | ICD-10-CM

## 2021-08-04 MED ORDER — CELECOXIB 200 MG PO CAPS: 200.0000 mg | ORAL_CAPSULE | Freq: Two times a day (BID) | ORAL | 3 refills | Status: DC

## 2021-08-04 NOTE — Progress Notes (Signed)
Office Visit Note   Patient: Rebecca Day           Date of Birth: 05-Jul-1986           MRN: [id number] Visit Date: 08/04/2021              Requested by: [name], [place] [place],  [place] [id number] PCP: [name], FNP   Assessment & Plan: Visit Diagnoses:  1. Chronic pain of left knee   2. Body mass index 45.0-49.9, adult ([place])   3. Morbid obesity ([place])     Plan: Impression is chronic left knee pain.  I feel that this is due to combination of increased BMI, relative muscular weakness, possible diagnosis of Ehlers-Danlos or some sort of hypermobility condition.  Based on treatment options she would like to try Celebrex as standard NSAIDs cause GI side effects.  I made a referral to outpatient PT.  She will make all efforts to lose weight.  Follow-up as needed.  The patient meets the AMA guidelines for Morbid (severe) obesity with a BMI > 40.0 and I have recommended weight loss.  Follow-Up Instructions: Return if symptoms worsen or fail to improve.   Orders:  Orders Placed This Encounter  Procedures   Ambulatory referral to Physical Therapy   Meds ordered this encounter  Medications   celecoxib (CELEBREX) 200 MG capsule    Sig: Take 1 capsule (200 mg total) by mouth 2 (two) times daily.    Dispense:  30 capsule    Refill:  3      Procedures: No procedures performed   Clinical Data: No additional findings.   Subjective: Chief Complaint  Patient presents with   Left Knee - Pain    Rebecca Day is a very pleasant 35 year old female here for evaluation of chronic left knee pain for over a year.  She takes ibuprofen Goody's powder Tylenol occasionally for the pain.  Denies any mechanical symptoms.  Denies any injuries or previous surgeries or injections.  She works a Designer, multimedia.  She endorses anterior knee pain with occasional swelling and grinding pain.  Denies any locking catching.   Review of Systems  Constitutional: Negative.   HENT:  Negative.    Eyes: Negative.   Respiratory: Negative.    Cardiovascular: Negative.   Endocrine: Negative.   Musculoskeletal: Negative.   Neurological: Negative.   Hematological: Negative.   Psychiatric/Behavioral: Negative.    All other systems reviewed and are negative.   Objective: Vital Signs: There were no vitals taken for this visit.  Physical Exam Vitals and nursing note reviewed.  Constitutional:      Appearance: She is well-developed.  Pulmonary:     Effort: Pulmonary effort is normal.  Skin:    General: Skin is warm.     Capillary Refill: Capillary refill takes less than 2 seconds.  Neurological:     Mental Status: She is alert and oriented to person, place, and time.  Psychiatric:        Behavior: Behavior normal.        Thought Content: Thought content normal.        Judgment: Judgment normal.    Ortho Exam  Left knee shows trace effusion.  1+ crepitus with range of motion.  Collaterals and cruciates are stable.  No joint line tenderness.  Specialty Comments:  No specialty comments available.  Imaging: No results found.   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   Lupus ([place]) 11/02/2018   Flank pain 11/02/2018   Depression 11/02/2018   History of hematuria 11/02/2018   Family history of spina bifida 07/06/2011   Past Medical History:  Diagnosis Date   Allergy    Anxiety    Asthma    Blood transfusion without reported diagnosis    as child    Clotting disorder ([facility])    blood clot after last pregnancy    Elevated blood sugar    Kidney  stones    Lupus ([place])    questionable, no meds or treatment per pt   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[facility])    Ovarian cyst    PMDD (premenstrual dysphoric disorder)     Family History  Problem Relation Age of Onset   Hypertension Mother    Colon polyps Mother    Cancer Father        dx'd in 67's   Colon cancer Father    Kidney disease Brother    Liver disease Brother    Cancer Brother    Hyperlipidemia Maternal Grandmother    Colon cancer Maternal Aunt    Colon cancer Other    Esophageal cancer Neg Hx    Rectal cancer Neg Hx    Stomach cancer Neg Hx     Past Surgical History:  Procedure Laterality Date   CESAREAN SECTION     C/S x 2   PILONIDAL CYST DRAINAGE     Social History   Occupational History   Not on file  Tobacco Use   Smoking status: Never   Smokeless tobacco: Never  Vaping Use   Vaping Use: Never used  Substance and Sexual Activity   Alcohol use: Yes    Comment: socially   Drug use: No   Sexual activity: Not on file

## 2021-08-05 ENCOUNTER — Other Ambulatory Visit: Payer: Self-pay | Admitting: Nurse Practitioner

## 2021-08-05 MED ORDER — METFORMIN HCL 500 MG PO TABS: 500.0000 mg | ORAL_TABLET | Freq: Two times a day (BID) | ORAL | 2 refills | Status: DC

## 2021-08-12 ENCOUNTER — Encounter: Payer: Self-pay | Admitting: Nurse Practitioner

## 2021-08-28 ENCOUNTER — Other Ambulatory Visit: Payer: Self-pay

## 2021-08-28 ENCOUNTER — Encounter (HOSPITAL_BASED_OUTPATIENT_CLINIC_OR_DEPARTMENT_OTHER): Payer: Self-pay | Admitting: Emergency Medicine

## 2021-08-28 ENCOUNTER — Emergency Department (HOSPITAL_BASED_OUTPATIENT_CLINIC_OR_DEPARTMENT_OTHER)
Admission: EM | Admit: 2021-08-28 | Discharge: 2021-08-28 | Disposition: A | Discharge status: Home / Self Care | Payer: Medicaid Other | Attending: Emergency Medicine | Admitting: Emergency Medicine

## 2021-08-28 DIAGNOSIS — Z5321 Procedure and treatment not carried out due to patient leaving prior to being seen by health care provider: Secondary | ICD-10-CM | POA: Insufficient documentation

## 2021-08-28 DIAGNOSIS — R1012 Left upper quadrant pain: Secondary | ICD-10-CM | POA: Insufficient documentation

## 2021-08-28 LAB — COMPREHENSIVE METABOLIC PANEL
ALT: 19 U/L (ref 0–44)
AST: 15 U/L (ref 15–41)
Albumin: 4.5 g/dL (ref 3.5–5.0)
Alkaline Phosphatase: 83 U/L (ref 38–126)
Anion gap: 11 (ref 5–15)
BUN: 10 mg/dL (ref 6–20)
CO2: 21 mmol/L — ABNORMAL LOW (ref 22–32)
Calcium: 9.8 mg/dL (ref 8.9–10.3)
Chloride: 105 mmol/L (ref 98–111)
Creatinine, Ser: 0.89 mg/dL (ref 0.44–1.00)
GFR, Estimated: >60 mL/min (ref >60)
Glucose, Bld: 101 mg/dL — ABNORMAL HIGH (ref 70–99)
Potassium: 4.1 mmol/L (ref 3.5–5.1)
Sodium: 137 mmol/L (ref 135–145)
Total Bilirubin: 0.4 mg/dL (ref 0.3–1.2)
Total Protein: 8 g/dL (ref 6.5–8.1)

## 2021-08-28 LAB — CBC
HCT: 43.3 % (ref 36.0–46.0)
Hemoglobin: 14.5 g/dL (ref 12.0–15.0)
MCH: 29.2 pg (ref 26.0–34.0)
MCHC: 33.5 g/dL (ref 30.0–36.0)
MCV: 87.1 fL (ref 80.0–100.0)
Platelets: 347 10*3/uL (ref 150–400)
RBC: 4.97 MIL/uL (ref 3.87–5.11)
RDW: 12.6 % (ref 11.5–15.5)
WBC: 9.1 10*3/uL (ref 4.0–10.5)
nRBC: 0 % (ref 0.0–0.2)

## 2021-08-28 LAB — LIPASE, BLOOD: Lipase: <10 U/L — ABNORMAL LOW (ref 11–51)

## 2021-08-28 NOTE — ED Triage Notes (Signed)
Pt arrives pov with c/o LUQ pain x 3 days. Pain worsened after eating tonight. Pt denies etoh use

## 2021-08-31 ENCOUNTER — Encounter (HOSPITAL_BASED_OUTPATIENT_CLINIC_OR_DEPARTMENT_OTHER): Payer: Self-pay

## 2021-08-31 ENCOUNTER — Emergency Department (HOSPITAL_BASED_OUTPATIENT_CLINIC_OR_DEPARTMENT_OTHER): Payer: No Typology Code available for payment source

## 2021-08-31 ENCOUNTER — Other Ambulatory Visit: Payer: Self-pay

## 2021-08-31 ENCOUNTER — Emergency Department (HOSPITAL_BASED_OUTPATIENT_CLINIC_OR_DEPARTMENT_OTHER): Payer: No Typology Code available for payment source | Admitting: Radiology

## 2021-08-31 ENCOUNTER — Emergency Department (HOSPITAL_BASED_OUTPATIENT_CLINIC_OR_DEPARTMENT_OTHER)
Admission: EM | Admit: 2021-08-31 | Discharge: 2021-08-31 | Disposition: A | Discharge status: Home / Self Care | Payer: No Typology Code available for payment source | Attending: Emergency Medicine | Admitting: Emergency Medicine

## 2021-08-31 DIAGNOSIS — R0781 Pleurodynia: Secondary | ICD-10-CM | POA: Diagnosis not present

## 2021-08-31 DIAGNOSIS — K30 Functional dyspepsia: Secondary | ICD-10-CM | POA: Diagnosis not present

## 2021-08-31 DIAGNOSIS — E119 Type 2 diabetes mellitus without complications: Secondary | ICD-10-CM | POA: Diagnosis not present

## 2021-08-31 DIAGNOSIS — Z7984 Long term (current) use of oral hypoglycemic drugs: Secondary | ICD-10-CM | POA: Insufficient documentation

## 2021-08-31 DIAGNOSIS — J45909 Unspecified asthma, uncomplicated: Secondary | ICD-10-CM | POA: Insufficient documentation

## 2021-08-31 DIAGNOSIS — R1012 Left upper quadrant pain: Secondary | ICD-10-CM | POA: Diagnosis present

## 2021-08-31 DIAGNOSIS — R1013 Epigastric pain: Secondary | ICD-10-CM

## 2021-08-31 LAB — COMPREHENSIVE METABOLIC PANEL
ALT: 16 U/L (ref 0–44)
AST: 13 U/L — ABNORMAL LOW (ref 15–41)
Albumin: 4.1 g/dL (ref 3.5–5.0)
Alkaline Phosphatase: 87 U/L (ref 38–126)
Anion gap: 8 (ref 5–15)
BUN: 13 mg/dL (ref 6–20)
CO2: 24 mmol/L (ref 22–32)
Calcium: 9.6 mg/dL (ref 8.9–10.3)
Chloride: 107 mmol/L (ref 98–111)
Creatinine, Ser: 0.89 mg/dL (ref 0.44–1.00)
GFR, Estimated: >60 mL/min (ref >60)
Glucose, Bld: 86 mg/dL (ref 70–99)
Potassium: 4.1 mmol/L (ref 3.5–5.1)
Sodium: 139 mmol/L (ref 135–145)
Total Bilirubin: 0.6 mg/dL (ref 0.3–1.2)
Total Protein: 7.4 g/dL (ref 6.5–8.1)

## 2021-08-31 LAB — URINALYSIS, ROUTINE W REFLEX MICROSCOPIC
Bilirubin Urine: NEGATIVE
Glucose, UA: NEGATIVE mg/dL
Ketones, ur: NEGATIVE mg/dL
Leukocytes,Ua: NEGATIVE
Nitrite: NEGATIVE
Protein, ur: 30 mg/dL — AB
Specific Gravity, Urine: >1.046 — ABNORMAL HIGH (ref 1.005–1.030)
pH: 6.5 (ref 5.0–8.0)

## 2021-08-31 LAB — D-DIMER, QUANTITATIVE: D-Dimer, Quant: 0.71 ug/mL-FEU — ABNORMAL HIGH (ref 0.00–0.50)

## 2021-08-31 LAB — LIPASE, BLOOD: Lipase: <10 U/L — ABNORMAL LOW (ref 11–51)

## 2021-08-31 LAB — LACTIC ACID, PLASMA: Lactic Acid, Venous: 1.1 mmol/L (ref 0.5–1.9)

## 2021-08-31 LAB — CBC
HCT: 40.9 % (ref 36.0–46.0)
Hemoglobin: 13.5 g/dL (ref 12.0–15.0)
MCH: 29 pg (ref 26.0–34.0)
MCHC: 33 g/dL (ref 30.0–36.0)
MCV: 88 fL (ref 80.0–100.0)
Platelets: 313 10*3/uL (ref 150–400)
RBC: 4.65 MIL/uL (ref 3.87–5.11)
RDW: 12.6 % (ref 11.5–15.5)
WBC: 8.2 10*3/uL (ref 4.0–10.5)
nRBC: 0 % (ref 0.0–0.2)

## 2021-08-31 LAB — PREGNANCY, URINE: Preg Test, Ur: NEGATIVE

## 2021-08-31 LAB — TROPONIN I (HIGH SENSITIVITY)
Troponin I (High Sensitivity): <2 ng/L (ref <18)
Troponin I (High Sensitivity): <2 ng/L (ref <18)

## 2021-08-31 MED ORDER — SODIUM CHLORIDE 0.9 % IV BOLUS
1000.0000 mL | Freq: Once | INTRAVENOUS | Status: AC
  Administered 2021-08-31: 1000 mL via INTRAVENOUS

## 2021-08-31 MED ORDER — FENTANYL CITRATE PF 50 MCG/ML IJ SOSY
50.0000 ug | PREFILLED_SYRINGE | Freq: Once | INTRAMUSCULAR | Status: AC
  Administered 2021-08-31: 50 ug via INTRAVENOUS
  Filled 2021-08-31: qty 1

## 2021-08-31 MED ORDER — FAMOTIDINE 20 MG PO TABS: 20.0000 mg | ORAL_TABLET | Freq: Two times a day (BID) | ORAL | 0 refills | Status: DC

## 2021-08-31 MED ORDER — ONDANSETRON HCL 4 MG/2ML IJ SOLN
4.0000 mg | Freq: Once | INTRAMUSCULAR | Status: AC
  Administered 2021-08-31: 4 mg via INTRAVENOUS
  Filled 2021-08-31: qty 2

## 2021-08-31 MED ORDER — ALUM & MAG HYDROXIDE-SIMETH 200-200-20 MG/5ML PO SUSP
30.0000 mL | Freq: Once | ORAL | Status: AC
  Administered 2021-08-31: 30 mL via ORAL
  Filled 2021-08-31: qty 30

## 2021-08-31 MED ORDER — LIDOCAINE VISCOUS HCL 2 % MT SOLN
15.0000 mL | Freq: Once | OROMUCOSAL | Status: AC
  Administered 2021-08-31: 15 mL via ORAL
  Filled 2021-08-31: qty 15

## 2021-08-31 MED ORDER — PANTOPRAZOLE SODIUM 40 MG PO TBEC: 40.0000 mg | DELAYED_RELEASE_TABLET | Freq: Every day | ORAL | 0 refills | Status: DC

## 2021-08-31 MED ORDER — IOHEXOL 300 MG/ML  SOLN
100.0000 mL | Freq: Once | INTRAMUSCULAR | Status: AC | PRN
  Administered 2021-08-31: 100 mL via INTRAVENOUS

## 2021-08-31 MED ORDER — FAMOTIDINE IN NACL 20-0.9 MG/50ML-% IV SOLN: 20.0000 mg | Freq: Once | INTRAVENOUS | Status: DC

## 2021-08-31 MED ORDER — CALCIUM CARBONATE ANTACID 500 MG PO CHEW: 1.0000 | CHEWABLE_TABLET | Freq: Every day | ORAL | 0 refills | Status: AC

## 2021-08-31 MED ORDER — IOHEXOL 350 MG/ML SOLN
100.0000 mL | Freq: Once | INTRAVENOUS | Status: AC | PRN
  Administered 2021-08-31: 100 mL via INTRAVENOUS

## 2021-08-31 MED ORDER — SODIUM CHLORIDE 0.9 % IV SOLN: INTRAVENOUS | Status: DC

## 2021-08-31 NOTE — Discharge Instructions (Addendum)
You were evaluated in the Emergency Department and after careful evaluation, we did not find any emergent condition requiring admission or further testing in the hospital.  Your exam/testing today was overall reassuring.  Your chest x-ray, EKG and screening labs and CT of your abdomen pelvis did not reveal acute abnormalities.  Your symptoms could be consistent with a peptic ulcer versus gastroesophageal reflux.  Given your history of H. pylori infection, I strongly suspect an ulcer as the etiology of your presentation.  Recommend you STOP all NSAIDs as these can worsen your symptoms. Additionally, symptoms could be exacerbated by the fact that you are having nausea, vomiting, diarrhea which could be due to a viral gastroenteritis.  Recommend continued oral hydration.  We will treat with Zofran for nausea, start you on an acid blocking medication and provided outpatient referral to gastroenterology for further evaluation.  Please return to the Emergency Department if you experience any worsening of your condition.  Thank you for allowing [place] to be a part of your care.

## 2021-08-31 NOTE — ED Provider Notes (Signed)
[facility] EMERGENCY DEPT Provider Note   CSN: [id number] Arrival date & time: 08/31/21  7517     History  Chief Complaint  Patient presents with   Abdominal Pain    Rebecca Day is a 36 y.o. female.   Abdominal Pain Associated symptoms: diarrhea, nausea and vomiting    36 year old female with medical history significant for lupus, asthma, prior H. pylori infection, reported diagnosis of diabetes on metformin who presents emergency department with abdominal pain.  Patient states that she has had left upper quadrant pain that radiates to her left ribs for the last 4 days.  She also endorses epigastric pain.  She states that she has occasional reflux.  Symptoms are slightly worse when lying flat.  She denies any fevers or chills.  She has had associated nausea, vomiting, diarrhea for the past day.  She denies any blood in her vomit or stool.  She does have a history of PE after her first pregnancy in the setting of lupus and is not currently on anticoagulation.  She thinks the metformin may be causing nausea and diarrhea.  Home Medications Prior to Admission medications   Medication Sig Start Date End Date Taking? Authorizing Provider  calcium carbonate (TUMS) 500 MG chewable tablet Chew 1 tablet (200 mg of elemental calcium total) by mouth daily for 14 days. 08/31/21 09/14/21 Yes Regan Lemming, MD  famotidine (PEPCID) 20 MG tablet Take 1 tablet (20 mg total) by mouth 2 (two) times daily. 08/31/21  Yes Regan Lemming, MD  pantoprazole (PROTONIX) 40 MG tablet Take 1 tablet (40 mg total) by mouth daily. 08/31/21 09/30/21 Yes Regan Lemming, MD  celecoxib (CELEBREX) 200 MG capsule Take 1 capsule (200 mg total) by mouth 2 (two) times daily. 08/04/21   Leandrew Koyanagi, MD  gabapentin (NEURONTIN) 300 MG capsule Take 1 capsule (300 mg total) by mouth 2 (two) times daily. 07/30/21   Truett Mainland, DO  megestrol (MEGACE) 40 MG tablet Take 80 mg by mouth 2 (two) times daily. 05/14/21    [provider]  metFORMIN (GLUCOPHAGE) 500 MG tablet Take 1 tablet (500 mg total) by mouth 2 (two) times daily with a meal. 08/05/21 08/05/22  [name], FNP      Allergies    Penicillins    Review of Systems   Review of Systems  Gastrointestinal:  Positive for abdominal pain, diarrhea, nausea and vomiting.  All other systems reviewed and are negative.  Physical Exam Updated Vital Signs BP 127/73 (BP Location: Left Wrist)    Pulse 78    Temp 98.2 F (36.8 C) (Oral)    Resp 16    Wt (!) 140.6 kg    SpO2 100%    BMI 47.14 kg/m  Physical Exam Vitals and nursing note reviewed.  Constitutional:      General: She is not in acute distress.    Appearance: She is well-developed.  HENT:     Head: Normocephalic and atraumatic.  Eyes:     Conjunctiva/sclera: Conjunctivae normal.     Pupils: Pupils are equal, round, and reactive to light.  Cardiovascular:     Rate and Rhythm: Normal rate and regular rhythm.     Heart sounds: No murmur heard. Pulmonary:     Effort: Pulmonary effort is normal. No respiratory distress.     Breath sounds: Normal breath sounds.  Abdominal:     General: There is no distension.     Palpations: Abdomen is soft.     Tenderness: There  is abdominal tenderness in the epigastric area and left upper quadrant. There is no guarding or rebound.  Musculoskeletal:        General: No swelling, deformity or signs of injury.     Cervical back: Neck supple.  Skin:    General: Skin is warm and dry.     Capillary Refill: Capillary refill takes less than 2 seconds.     Findings: No lesion or rash.  Neurological:     General: No focal deficit present.     Mental Status: She is alert. Mental status is at baseline.  Psychiatric:        Mood and Affect: Mood normal.    ED Results / Procedures / Treatments   Labs (all labs ordered are listed, but only abnormal results are displayed) Labs Reviewed  LIPASE, BLOOD - Abnormal; Notable for the following components:       Result Value   Lipase <10 (*)    All other components within normal limits  COMPREHENSIVE METABOLIC PANEL - Abnormal; Notable for the following components:   AST 13 (*)    All other components within normal limits  URINALYSIS, ROUTINE W REFLEX MICROSCOPIC - Abnormal; Notable for the following components:   Specific Gravity, Urine >1.046 (*)    Hgb urine dipstick TRACE (*)    Protein, ur 30 (*)    All other components within normal limits  D-DIMER, QUANTITATIVE - Abnormal; Notable for the following components:   D-Dimer, Quant 0.71 (*)    All other components within normal limits  CBC  PREGNANCY, URINE  LACTIC ACID, PLASMA  TROPONIN I (HIGH SENSITIVITY)  TROPONIN I (HIGH SENSITIVITY)    EKG EKG Interpretation  Date/Time:  Monday August 31 2021 09:33:57 EST Ventricular Rate:  66 PR Interval:  147 QRS Duration: 89 QT Interval:  387 QTC Calculation: 406 R Axis:   82 Text Interpretation: Sinus rhythm Borderline abnrm T, anterolateral leads Confirmed by Regan Lemming ([id number]) on 08/31/2021 10:27:33 AM  Radiology DG Chest 2 View  Result Date: 08/31/2021 CLINICAL DATA:  Left upper quadrant pain. EXAM: CHEST - 2 VIEW COMPARISON:  06/16/2018 FINDINGS: The heart size and mediastinal contours are within normal limits. Both lungs are clear. The visualized skeletal structures are unremarkable. IMPRESSION: No active cardiopulmonary disease. Electronically Signed   By: Kerby Moors M.D.   On: 08/31/2021 08:41   CT Angio Chest PE W and/or Wo Contrast  Result Date: 08/31/2021 CLINICAL DATA:  Positive D-dimer, pain left upper quadrant of abdomen and left chest EXAM: CT ANGIOGRAPHY CHEST WITH CONTRAST TECHNIQUE: Multidetector CT imaging of the chest was performed using the standard protocol during bolus administration of intravenous contrast. Multiplanar CT image reconstructions and MIPs were obtained to evaluate the vascular anatomy. CONTRAST:  153mL OMNIPAQUE IOHEXOL 350 MG/ML SOLN  COMPARISON:  12/29/2015 FINDINGS: Cardiovascular: There is homogeneous enhancement in the thoracic aorta. There are no intraluminal filling defects in the central pulmonary artery branches. Contrast density in small peripheral pulmonary artery branches is less than optimal. There are no imaging signs of right ventricular strain. Mediastinum/Nodes: No significant lymphadenopathy seen. Lungs/Pleura: There is no focal pulmonary consolidation. Subtle increased density in the parahilar regions may be due to poor inspiration. There is no pleural effusion or pneumothorax. Upper Abdomen: Unremarkable. Musculoskeletal: Unremarkable. Review of the MIP images confirms the above findings. IMPRESSION: There is no evidence of central pulmonary artery embolism. Evaluation of small peripheral pulmonary artery branches is limited by less than optimal contrast enhancement. There are no  signs of right ventricular strain. There is no evidence of thoracic aortic dissection. There is no focal pulmonary consolidation. Electronically Signed   By: Elmer Picker M.D.   On: 08/31/2021 11:02   CT ABDOMEN PELVIS W CONTRAST  Result Date: 08/31/2021 CLINICAL DATA:  Abdominal pain, nausea, vomiting EXAM: CT ABDOMEN AND PELVIS WITH CONTRAST TECHNIQUE: Multidetector CT imaging of the abdomen and pelvis was performed using the standard protocol following bolus administration of intravenous contrast. CONTRAST:  137mL OMNIPAQUE IOHEXOL 300 MG/ML  SOLN COMPARISON:  06/14/2020 FINDINGS: Lower chest: Unremarkable. Hepatobiliary: Liver measures 18.5 cm in length. There is no dilation of bile ducts. Gallbladder is unremarkable. Pancreas: No focal abnormality is seen. Spleen: Unremarkable. Adrenals/Urinary Tract: Adrenals are not enlarged. There is no hydronephrosis. There are no renal or ureteral stones. There is possible 6 mm low-density structure in the renal cortex in the lower pole of right kidney, possibly a cyst. Evaluation of this finding is  limited due to the small size. There is no perinephric fluid collection. Urinary bladder is not distended. Stomach/Bowel: Stomach is unremarkable. Small bowel loops are not dilated. Appendix is not dilated. There is no significant wall thickening in colon. There is no pericolic stranding. Vascular/Lymphatic: Unremarkable.  Left renal vein is retroaortic. Reproductive: Unremarkable. Other: There is no ascites or pneumoperitoneum. Small umbilical hernia containing fat is seen. Musculoskeletal: Unremarkable. IMPRESSION: No significant abnormality is seen in the CT scan of abdomen and pelvis. Electronically Signed   By: Elmer Picker M.D.   On: 08/31/2021 08:20    Procedures Procedures    Medications Ordered in ED Medications  sodium chloride 0.9 % bolus 1,000 mL (0 mLs Intravenous Stopped 08/31/21 0928)  fentaNYL (SUBLIMAZE) injection 50 mcg (50 mcg Intravenous Given 08/31/21 0822)  ondansetron (ZOFRAN) injection 4 mg (4 mg Intravenous Given 08/31/21 0821)  alum & mag hydroxide-simeth (MAALOX/MYLANTA) 200-200-20 MG/5ML suspension 30 mL (30 mLs Oral Given 08/31/21 0823)    And  lidocaine (XYLOCAINE) 2 % viscous mouth solution 15 mL (15 mLs Oral Given 08/31/21 0824)  iohexol (OMNIPAQUE) 300 MG/ML solution 100 mL (100 mLs Intravenous Contrast Given 08/31/21 0749)  fentaNYL (SUBLIMAZE) injection 50 mcg (50 mcg Intravenous Given 08/31/21 1115)  iohexol (OMNIPAQUE) 350 MG/ML injection 100 mL (100 mLs Intravenous Contrast Given 08/31/21 1040)    ED Course/ Medical Decision Making/ A&P Clinical Course as of 08/31/21 2203  Mon Aug 31, 2021  1026 D-Dimer, Quant(!): 0.71 [JL]  1026 D-dimer elevated. CTA PE study ordered. [JL]    Clinical Course User Index [JL] Regan Lemming, MD                           Medical Decision Making Amount and/or Complexity of Data Reviewed Labs: ordered. Radiology: ordered.    Details: CT of the abdomen pelvis did not reveal an acute abnormality.  No evidence of bowel  obstruction or other acute abnormality. ECG/medicine tests: ordered.   36 year old female with medical history significant for lupus, asthma, prior H. pylori infection, reported diagnosis of diabetes on metformin who presents emergency department with abdominal pain.  Patient states that she has had left upper quadrant pain that radiates to her left ribs for the last 4 days.  She also endorses epigastric pain.  She states that she has occasional reflux.  Symptoms are slightly worse when lying flat.  She denies any fevers or chills.  She has had associated nausea, vomiting, diarrhea for the past day.  She denies  any blood in her vomit or stool.  She does have a history of PE after her first pregnancy in the setting of lupus and is not currently on anticoagulation.  She thinks the metformin may be causing nausea and diarrhea.  On arrival, the patient was afebrile, hemodynamically stable, hypertensive BP 170/97, subsequently improved to normotension status post pain control, saturating well on room air.  Patient presenting with multiple complaints to include epigastric pain, left upper quadrant pain, worse when lying flat.  Additional symptoms of nausea, vomiting, diarrhea for the past 24 hours.  On exam, the patient did have left upper quadrant and epigastric tenderness to palpation.  No rebound or guarding.  No concerning findings for peritonitis.  Differential diagnosis includes viral gastroenteritis, gastroesophageal reflux, peptic ulcer disease, hiatal hernia, DKA, PE, ACS, small bowel obstruction, cholelithiasis, pancreatitis, other intra-abdominal emergency.  Initial EKG revealed nonspecific T wave abnormalities in the anterior lateral leads, no ST segment changes.  Chest x-ray was performed which was negative for acute pulmonary or pulmonary disease.  A CT abdomen pelvis was performed which revealed no significant abnormality in the abdomen or pelvis.  The patient was administered IV fentanyl, IV  Zofran, IV fluids for volume resuscitation, pain control and antiemetics.  She was administered viscous Maalox and lidocaine.  On reassessment, the patient still endorsed pain and so an additional round of fentanyl was ordered.   Screening laboratory work-up was initiated and was significant for normal lipase, lactic acid normal 1.1, troponins x2 negative, CMP unremarkable, urinalysis without evidence of UTI, CBC unremarkable, urine pregnancy negative.  A D-dimer was found to be elevated to 0.71 and thus a CTA was obtained given the patient's location of pain.  Patient CTA was negative for central pulmonary artery embolism.  Evaluation of small peripheral pulmonary artery branches was limited by less than optimal contrast enhancement but there is no sign of right ventricular strain.  No evidence of aortic dissection no focal considered pulmonary consolidation.  The patient is not currently tachycardic, tachypneic or hypoxic.  Low suspicion for acute PE at this time as the etiology of this patient's presentation appears to be more intra-abdominal in origin.  The patient does have symptoms of a potential viral gastroenteritis with nausea, vomiting, diarrhea for the past day, however, given her history of H. pylori infection, I am also concerned with possible peptic ulcer disease versus GERD as etiology of the patient's presentation, possibly exacerbated by a gastroenteritis syndrome.  On further reassessment, the patient was feeling symptomatically improved and felt comfortable with continued plan for outpatient management.  I placed a referral to outpatient gastroenterology for close follow-up given her history of H. pylori infection.  She states that she was previously treated outpatient for this.  I recommended Protonix, Pepcid, Tums and close follow-up with gastroenterology.  Patient endorsed understanding.  Overall stable for discharge.    Final Clinical Impression(s) / ED Diagnoses Final diagnoses:   Epigastric pain  Dyspepsia    Rx / DC Orders ED Discharge Orders          Ordered    Ambulatory referral to Gastroenterology        08/31/21 0850    calcium carbonate (TUMS) 500 MG chewable tablet  Daily        08/31/21 1351    pantoprazole (PROTONIX) 40 MG tablet  Daily        08/31/21 1351    famotidine (PEPCID) 20 MG tablet  2 times daily  08/31/21 1351              Regan Lemming, MD 08/31/21 2203

## 2021-08-31 NOTE — ED Notes (Signed)
1st Trop collected at 0910 not 0710

## 2021-08-31 NOTE — ED Notes (Signed)
Attempted to get urine sample from pt, pt stated that she would like to wait 10 more minutes.

## 2021-08-31 NOTE — ED Triage Notes (Addendum)
C/o LUQ pain that radiates to L ribs x4days. Positive N/V/D. Denies fever, chills.  No relief with motrin or tylenol.  H/o lupus, c-section x2, previous PE (not currently taking a blood thinner)

## 2021-09-01 ENCOUNTER — Telehealth: Payer: Self-pay

## 2021-09-01 NOTE — Telephone Encounter (Signed)
Left pt vm to give the office a call back. Pt visited ED on 08/31/2021.

## 2021-09-02 ENCOUNTER — Ambulatory Visit: Payer: Medicaid Other | Admitting: Nurse Practitioner

## 2021-09-03 ENCOUNTER — Other Ambulatory Visit: Payer: Self-pay

## 2021-09-03 ENCOUNTER — Ambulatory Visit (INDEPENDENT_AMBULATORY_CARE_PROVIDER_SITE_OTHER): Payer: No Typology Code available for payment source | Admitting: Nurse Practitioner

## 2021-09-03 ENCOUNTER — Encounter: Payer: Self-pay | Admitting: Nurse Practitioner

## 2021-09-03 VITALS — BP 124/86 | HR 88 | Temp 98.5°F | Ht 68.0 in | Wt 314.0 lb

## 2021-09-03 DIAGNOSIS — R1013 Epigastric pain: Secondary | ICD-10-CM | POA: Diagnosis not present

## 2021-09-03 DIAGNOSIS — Z6841 Body Mass Index (BMI) 40.0 and over, adult: Secondary | ICD-10-CM | POA: Diagnosis not present

## 2021-09-03 DIAGNOSIS — R1012 Left upper quadrant pain: Secondary | ICD-10-CM | POA: Diagnosis not present

## 2021-09-03 MED ORDER — TRAMADOL HCL 50 MG PO TABS: 50.0000 mg | ORAL_TABLET | Freq: Four times a day (QID) | ORAL | 0 refills | Status: DC | PRN

## 2021-09-03 MED ORDER — PANTOPRAZOLE SODIUM 40 MG PO TBEC: 40.0000 mg | DELAYED_RELEASE_TABLET | Freq: Two times a day (BID) | ORAL | 1 refills | Status: DC

## 2021-09-03 NOTE — Progress Notes (Signed)
I,Yamilka J Llittleton,acting as a Education administrator for [employer], FNP.,have documented all relevant documentation on the behalf of [name], FNP,as directed by  [name], FNP while in the presence of [name], [place].   This visit occurred during the SARS-CoV-2 public health emergency. Safety protocols were in place, including screening questions prior to the visit, additional usage of staff PPE, and extensive cleaning of exam room while observing appropriate contact time as indicated for disinfecting solutions.  Subjective:     Patient ID: Rebecca Day , female    DOB: [birth year] , 36 y.o.   MRN: [id number]   Chief Complaint  Patient presents with   ER F/U    HPI  Patient presents today for a ER F/U. She reports she still has stomach pain and was told it may be an ulcer. She was given some acid reflux medicine and it is not helping. She has an appt with GI - Dr. Chester Holstein on 09/17/2021. Her pain is on the right side constant, sharp pain wrapping around her left lower back. She has a stabbing and constant feeling. Her "belly" feels full. She is unable to lay on her left side. She has pain after she eats. She had been taking ibuprofen prior to her having this stomach pain - for her leg and joint pain. Was taking tylenol.   She did go to the orthopedic for her leg and was told likely due to her weight.    Wt Readings from Last 3 Encounters: 09/03/21 : (!) 314 lb (142.4 kg) 08/31/21 : (!) 310 lb (140.6 kg) 07/30/21 : (!) 308 lb (139.7 kg)      Past Medical History:  Diagnosis Date   Allergy    Anxiety    Asthma    Blood transfusion without reported diagnosis    as child    Clotting disorder ([place])    blood clot after last pregnancy    Elevated blood sugar    Kidney stones    Lupus ([place])    questionable, no meds or treatment per pt   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[place])    Ovarian cyst    PMDD (premenstrual dysphoric disorder)      Family History  Problem Relation Age of Onset    Hypertension Mother    Colon polyps Mother    Cancer Father        dx'd in 46's   Colon cancer Father    Kidney disease Brother    Liver disease Brother    Cancer Brother    Hyperlipidemia Maternal Grandmother    Colon cancer Maternal Aunt    Colon cancer Other    Esophageal cancer Neg Hx    Rectal cancer Neg Hx    Stomach cancer Neg Hx      Current Outpatient Medications:    calcium carbonate (TUMS) 500 MG chewable tablet, Chew 1 tablet (200 mg of elemental calcium total) by mouth daily for 14 days., Disp: 14 tablet, Rfl: 0   famotidine (PEPCID) 20 MG tablet, Take 1 tablet (20 mg total) by mouth 2 (two) times daily., Disp: 30 tablet, Rfl: 0   gabapentin (NEURONTIN) 300 MG capsule, Take 1 capsule (300 mg total) by mouth 2 (two) times daily., Disp: 60 capsule, Rfl: 3   megestrol (MEGACE) 40 MG tablet, Take 80 mg by mouth 2 (two) times daily., Disp: , Rfl:    metFORMIN (GLUCOPHAGE) 500 MG tablet, Take 1 tablet (500 mg total) by mouth 2 (two) times daily with a meal., Disp:  60 tablet, Rfl: 2   traMADol (ULTRAM) 50 MG tablet, Take 1 tablet (50 mg total) by mouth every 6 (six) hours as needed., Disp: 20 tablet, Rfl: 0   celecoxib (CELEBREX) 200 MG capsule, Take 1 capsule (200 mg total) by mouth 2 (two) times daily. (Patient not taking: Reported on 09/03/2021), Disp: 30 capsule, Rfl: 3   pantoprazole (PROTONIX) 40 MG tablet, Take 1 tablet (40 mg total) by mouth 2 (two) times daily., Disp: 60 tablet, Rfl: 1   Allergies  Allergen Reactions   Penicillins Hives    Has patient had a PCN reaction causing immediate rash, facial/tongue/throat swelling, SOB or lightheadedness with hypotension: Yes Has patient had a PCN reaction causing severe rash involving mucus membranes or skin necrosis: No Has patient had a PCN reaction that required hospitalization: Yes Has patient had a PCN reaction occurring within the last 10 years: NO If all of the above answers are "NO", then may proceed with Cephalosporin  use.     Review of Systems  Constitutional: Negative.   Respiratory: Negative.    Cardiovascular: Negative.   Gastrointestinal:  Positive for abdominal pain (radiating around to her back - sharp, constant pain).  Psychiatric/Behavioral: Negative.      Today's Vitals   09/03/21 0858  BP: 124/86  Pulse: 88  Temp: 98.5 F (36.9 C)  Weight: (!) 314 lb (142.4 kg)  Height: 5\' 8"  (1.727 m)  PainSc: 0-No pain   Body mass index is 47.74 kg/m.   Objective:  Physical Exam Constitutional:      General: She is not in acute distress.    Appearance: Normal appearance. She is obese.  Cardiovascular:     Rate and Rhythm: Normal rate and regular rhythm.     Pulses: Normal pulses.     Heart sounds: Normal heart sounds. No murmur heard. Pulmonary:     Effort: Pulmonary effort is normal. No respiratory distress.     Breath sounds: Normal breath sounds.  Abdominal:     General: Bowel sounds are normal. There is no distension.     Palpations: Abdomen is soft. There is no mass.     Tenderness: There is abdominal tenderness (left upper quadrant).  Neurological:     General: No focal deficit present.     Mental Status: She is alert and oriented to person, place, and time.     Cranial Nerves: No cranial nerve deficit.     Motor: No weakness.  Psychiatric:        Mood and Affect: Mood normal.        Behavior: Behavior normal.        Thought Content: Thought content normal.        Judgment: Judgment normal.        Assessment And Plan:     1. Left upper quadrant abdominal pain Comments: Tenderness to left upper quadrant, will increase protonix to two times a day and provide limited amount of tramadol, if not better in 1 week return call to office.  Reviewed chart from ER. She is to follow up with GI already has an appt - pantoprazole (PROTONIX) 40 MG tablet; Take 1 tablet (40 mg total) by mouth 2 (two) times daily.  Dispense: 60 tablet; Refill: 1 - traMADol (ULTRAM) 50 MG tablet; Take 1  tablet (50 mg total) by mouth every 6 (six) hours as needed.  Dispense: 20 tablet; Refill: 0  2. Dyspepsia - pantoprazole (PROTONIX) 40 MG tablet; Take 1 tablet (40 mg total) by  mouth 2 (two) times daily.  Dispense: 60 tablet; Refill: 1 - traMADol (ULTRAM) 50 MG tablet; Take 1 tablet (50 mg total) by mouth every 6 (six) hours as needed.  Dispense: 20 tablet; Refill: 0  3. Class 3 severe obesity due to excess calories without serious comorbidity with body mass index (BMI) of 45.0 to 49.9 in adult [facility]) Chronic Discussed healthy diet and regular exercise options  Encouraged to exercise at least 150 minutes per week with 2 days of strength training when able    Patient was given opportunity to ask questions. Patient verbalized understanding of the plan and was able to repeat key elements of the plan. All questions were answered to their satisfaction.  [name], FNP   I, [name], FNP, have reviewed all documentation for this visit. The documentation on 09/03/21 for the exam, diagnosis, procedures, and orders are all accurate and complete.   IF YOU HAVE BEEN REFERRED TO A SPECIALIST, IT MAY TAKE 1-2 WEEKS TO SCHEDULE/PROCESS THE REFERRAL. IF YOU HAVE NOT HEARD FROM US/SPECIALIST IN TWO WEEKS, PLEASE GIVE [place] A CALL AT [phone] X [phone].   THE PATIENT IS ENCOURAGED TO PRACTICE SOCIAL DISTANCING DUE TO THE COVID-19 PANDEMIC.

## 2021-09-07 ENCOUNTER — Ambulatory Visit: Payer: Medicaid Other | Admitting: Nurse Practitioner

## 2021-09-17 ENCOUNTER — Encounter: Payer: Self-pay | Admitting: Nurse Practitioner

## 2021-09-17 ENCOUNTER — Ambulatory Visit (INDEPENDENT_AMBULATORY_CARE_PROVIDER_SITE_OTHER): Payer: No Typology Code available for payment source | Admitting: Nurse Practitioner

## 2021-09-17 VITALS — BP 112/70 | HR 88 | Ht 68.0 in | Wt 315.2 lb

## 2021-09-17 DIAGNOSIS — R1012 Left upper quadrant pain: Secondary | ICD-10-CM

## 2021-09-17 DIAGNOSIS — R1013 Epigastric pain: Secondary | ICD-10-CM

## 2021-09-17 MED ORDER — PANTOPRAZOLE SODIUM 40 MG PO TBEC: 40.0000 mg | DELAYED_RELEASE_TABLET | Freq: Every day | ORAL | 3 refills | Status: DC

## 2021-09-17 MED ORDER — SUCRALFATE 1 G PO TABS: 2.0000 g | ORAL_TABLET | Freq: Three times a day (TID) | ORAL | 1 refills | Status: DC

## 2021-09-17 MED ORDER — FAMOTIDINE 20 MG PO TABS: 20.0000 mg | ORAL_TABLET | Freq: Every day | ORAL | 3 refills | Status: DC

## 2021-09-17 NOTE — Patient Instructions (Signed)
We have sent the following medications to your pharmacy for you to pick up at your convenience: Carafate 2 gm 30-60 minutes before meals and at bedtime.  Pantoprazole 40 mg daily 30-60 minutes before breakfast.   Continue Pepcid 20 mg nightly.   You have been scheduled for an endoscopy. Please follow written instructions given to you at your visit today. If you use inhalers (even only as needed), please bring them with you on the day of your procedure.  If you are age 15 or older, your body mass index should be between 23-30. Your Body mass index is 47.93 kg/m. If this is out of the aforementioned range listed, please consider follow up with your Primary Care Provider.  If you are age 76 or younger, your body mass index should be between 19-25. Your Body mass index is 47.93 kg/m. If this is out of the aformentioned range listed, please consider follow up with your Primary Care Provider.   ________________________________________________________  The [facility] providers would like to encourage you to use [facility] to communicate with providers for non-urgent requests or questions.  Due to long hold times on the telephone, sending your provider a message by [facility] may be a faster and more efficient way to get a response.  Please allow 48 business hours for a response.  Please remember that this is for non-urgent requests.  _______________________________________________________

## 2021-09-17 NOTE — Progress Notes (Signed)
ASSESSMENT AND PLAN    # 36 yo female with worsening of chronic LUQ pain after two months of Celebrex. H.pylori gastritis on EGD in August 2020 ( done for upper abdominal pain). She recalls completing treatment but I do not see where she ever did H.pylori eradication testing. She is undergoing evaluation for bariatric surgery with [facility]. Surgeon Dr. Evorn Day requesting pre-op EGD --stop Celebrex. No NSAIDS --Pantoprazole 40 mg q am , Pepcid 20 mg Q HS --Continue Carafate. Can take it ac and HS. Separate from other medications by two hours. Watch for constipation with this medication. If develops constipation may use Miralax daily as needed.  --For further evaluation of worsening LUQ pain ( in setting of Celebrex) and also in preparation for bariatric surgery patient will be scheduled for EGD. The risks and benefits of EGD with possible biopsies were discussed with the patient who agrees to proceed.  -- Eventually need to hold Pepcid and pantoprazole to check status of H. Pylori ( unless this is done on biopsy at time of EGD)  # [facility] of colon cancer in father. Patient due for 5 yr interval colonoscopy in 2025.   HISTORY OF PRESENT ILLNESS    Chief Complaint : abdominal pain  Rebecca Day is a 35 y.o. female known to Dr. Tarri Day with a past medical history of IBS, [facility] of colon cancer, chronic abdominal pain, chronic diarrhea. Additional medical history as listed in [place].    Patient saw Dr. Tarri Day in April 2020 for LUQ pain and diarrhea.  CTAP was negative at that time. She was previously evaluated by Dr. Benson Day for same pain but never given a diagnosis.  Dr. Tarri Day gave her a trial of Dicyclomine but she cannot remember if it helped. She was scheduled for EGD and colonoscopy which were completed in August 2020. She had H pylori gastritis and a tiny non -adenomatous colon polyp.  She cannot remember if she had the requested H.pylori eradication testing done but results are not in Epic. Patient  doesn't really know if she improved with h.pylori treatment because she has continued to have intermittent LUQ pain. The pain has gotten worse since December which is around the time Orthopedics started her on Celebrex. The pain is worse with eating. Her abdomen feels "full"  08/31/21 ED for abdominal pain . LFTs, lipase , and CBC were normal. CTAP w/ contrast unrevealing.  D-dimer was elevated so she had a CT of chest which was unremarkable. Discharged from ED on pantoprazole and pepcid.   09/03/21 PCP visit. She says that PCP increased dose of Pepcid to BID ( though according to office note it was the pantoprazole that was increased to twice daily . She was given rx for Ultram.   Since seeing PCP she has been started on Carafate via Virtual visit ( [facility] through [facility]). She is taking it twice daily and high it seems to be helping.   Rebecca Day is undergoing evaluation for bariatric surgery. She says that Surgeon ( Dr. Evorn Day at [place] ) asks that we repeat her EGD prior to surgery.   Hepatic Function Latest Ref Rng & Units 08/31/2021 08/28/2021 06/13/2020  Total Protein 6.5 - 8.1 g/dL 7.4 8.0 8.5(H)  Albumin 3.5 - 5.0 g/dL 4.1 4.5 4.5  AST 15 - 41 U/L 13(L) 15 32  ALT 0 - 44 U/L 16 19 64(H)  Alk Phosphatase 38 - 126 U/L 87 83 72  Total Bilirubin 0.3 - 1.2 mg/dL 0.6 0.4  0.7    CBC Latest Ref Rng & Units 08/31/2021 08/28/2021 06/13/2020  WBC 4.0 - 10.5 K/uL 8.2 9.1 6.7  Hemoglobin 12.0 - 15.0 g/dL 13.5 14.5 13.8  Hematocrit 36.0 - 46.0 % 40.9 43.3 42.3  Platelets 150 - 400 K/uL 313 347 355    Lab Results  Component Value Date   LIPASE <10 (L) 08/31/2021      PREVIOUS ENDOSCOPIC EVALUATIONS / PERTINENT STUDIES:   August 2021 EGD   --Normal esophagus. Biopsied. - Erythematous mucosa in the antrum. Biopsied. - Normal examined duodenum. Biopsied. - Source of abdominal pain not identified on this study. Await biopsy results Diagnosis 1. Surgical [P], duodenal - BENIGN  DUODENAL MUCOSA WITH FOCALLY INCREASED INTRAEPITHELIAL LYMPHOCYTES - NO ACUTE INFLAMMATION OR VILLOUS BLUNTING IDENTIFIED 2. Surgical [P], gastric - CHRONIC ACTIVE GASTRITIS - H. PYLORI ORGANISMS PRESENT - NO INTESTINAL METAPLASIA IDENTIFIED - SEE COMMENT 3. Surgical [P], distal esophagus - REACTIVE SQUAMOUS MUCOSA WITH INCREASED INTRAEPITHELIAL LYMPHOCYTES - SEE COMMENT 4. Surgical [P], mid and proximal esophagus - REACTIVE SQUAMOUS MUCOSA WITH INCREASED INTRAEPITHELIAL LYMPHOCYTES - SEE COMMENT 5. Surgical [P], colon, cecum, polyp - POLYPOID FRAGMENTS OF COLONIC MUCOSA WITH LYMPHOID AGGREGATE(S)(1 OF 1 FRAGMENTS) - NO HIGH GRADE DYSPLASIA OR MALIGNANCY IDENTIFIED - SEE COMMENT   08/31/21 CT ABDOMEN AND PELVIS WITH CONTRAST   TECHNIQUE: Multidetector CT imaging of the abdomen and pelvis was performed using the standard protocol following bolus administration of intravenous contrast.   CONTRAST:  138m OMNIPAQUE IOHEXOL 300 MG/ML  SOLN   COMPARISON:  06/14/2020   FINDINGS: Lower chest: Unremarkable.   Hepatobiliary: Liver measures 18.5 cm in length. There is no dilation of bile ducts. Gallbladder is unremarkable.   Pancreas: No focal abnormality is seen.   Spleen: Unremarkable.   Adrenals/Urinary Tract: Adrenals are not enlarged. There is no hydronephrosis. There are no renal or ureteral stones. There is possible 6 mm low-density structure in the renal cortex in the lower pole of right kidney, possibly a cyst. Evaluation of this finding is limited due to the small size. There is no perinephric fluid collection. Urinary bladder is not distended.   Stomach/Bowel: Stomach is unremarkable. Small bowel loops are not dilated. Appendix is not dilated. There is no significant wall thickening in colon. There is no pericolic stranding.   Vascular/Lymphatic: Unremarkable.  Left renal vein is retroaortic.   Reproductive: Unremarkable.   Other: There is no ascites or  pneumoperitoneum. Small umbilical hernia containing fat is seen.   Musculoskeletal: Unremarkable.   IMPRESSION: No significant abnormality is seen in the CT scan of abdomen and pelvis.   Current Medications, Allergies, Past Medical History, Past Surgical History, Family History and Social History were reviewed in [employer].     Current Outpatient Medications  Medication Sig Dispense Refill   famotidine (PEPCID) 20 MG tablet Take 1 tablet (20 mg total) by mouth 2 (two) times daily. 30 tablet 0   gabapentin (NEURONTIN) 300 MG capsule Take 1 capsule (300 mg total) by mouth 2 (two) times daily. 60 capsule 3   megestrol (MEGACE) 40 MG tablet Take 80 mg by mouth 2 (two) times daily.     metFORMIN (GLUCOPHAGE) 500 MG tablet Take 1 tablet (500 mg total) by mouth 2 (two) times daily with a meal. 60 tablet 2   pantoprazole (PROTONIX) 40 MG tablet Take 1 tablet (40 mg total) by mouth 2 (two) times daily. 60 tablet 1   No current facility-administered medications for this visit.  Review of Systems: No chest pain. No shortness of breath. No urinary complaints.   PHYSICAL EXAM :    Wt Readings from Last 3 Encounters:  09/17/21 (!) 315 lb 3.2 oz (143 kg)  09/03/21 (!) 314 lb (142.4 kg)  08/31/21 (!) 310 lb (140.6 kg)    BP 112/70    Pulse 88    Ht '5\' 8"'  (1.727 m)    Wt (!) 315 lb 3.2 oz (143 kg)    BMI 47.93 kg/m  Constitutional:  Generally well appearing female in no acute distress. Psychiatric: Pleasant. Normal mood and affect. Behavior is normal. EENT: Pupils normal.  Conjunctivae are normal. No scleral icterus. Neck supple.  Cardiovascular: Normal rate, regular rhythm. No edema Pulmonary/chest: Effort normal and breath sounds normal. No wheezing, rales or rhonchi. Abdominal: Soft, nondistended, nontender. Bowel sounds active throughout. There are no masses palpable. No hepatomegaly. Neurological: Alert and oriented to person place and time. Skin:  Skin is warm and dry. No rashes noted.  Tye Savoy, NP  09/17/2021, 11:17 AM  Cc:  [name], FNP

## 2021-09-17 NOTE — Progress Notes (Signed)
Reviewed and agree with management plans. ? ?Hanah Moultry L. Dorena Dorfman, MD, MPH  ?

## 2021-09-18 ENCOUNTER — Ambulatory Visit (AMBULATORY_SURGERY_CENTER): Payer: No Typology Code available for payment source | Admitting: Gastroenterology

## 2021-09-18 ENCOUNTER — Encounter: Payer: Self-pay | Admitting: Gastroenterology

## 2021-09-18 ENCOUNTER — Other Ambulatory Visit: Payer: Self-pay

## 2021-09-18 VITALS — BP 107/77 | HR 81 | Temp 97.6°F | Resp 19 | Ht 68.0 in | Wt 315.0 lb

## 2021-09-18 DIAGNOSIS — K2950 Unspecified chronic gastritis without bleeding: Secondary | ICD-10-CM | POA: Diagnosis not present

## 2021-09-18 DIAGNOSIS — R1012 Left upper quadrant pain: Secondary | ICD-10-CM

## 2021-09-18 DIAGNOSIS — K2970 Gastritis, unspecified, without bleeding: Secondary | ICD-10-CM

## 2021-09-18 DIAGNOSIS — B9681 Helicobacter pylori [H. pylori] as the cause of diseases classified elsewhere: Secondary | ICD-10-CM

## 2021-09-18 MED ORDER — DEXTROSE 5 % IV SOLN: Freq: Once | INTRAVENOUS | Status: AC

## 2021-09-18 MED ORDER — SODIUM CHLORIDE 0.9 % IV SOLN: 500.0000 mL | Freq: Once | INTRAVENOUS | Status: DC

## 2021-09-18 NOTE — Op Note (Signed)
[facility] Patient Name: Rebecca Day Procedure Date: 09/18/2021 8:48 AM MRN: [id number] Endoscopist: Thornton Park MD, MD Age: 36 Referring MD:  Date of Birth: 1986/07/28 Gender: Female Account #: [id number] Procedure:                Upper GI endoscopy Indications:              Acute on chronic RUQ pain after 2 months of Celebrex                           H pylori gastritis on EGD 03/2019 - completed                            treatment but eradication testing not performed                           Undergoing evaluation for bariatric surgery with                            Dr. Evorn Gong at [place], requesting pre-op EGD Medicines:                Monitored Anesthesia Care Procedure:                Pre-Anesthesia Assessment:                           - Prior to the procedure, a History and Physical                            was performed, and patient medications and                            allergies were reviewed. The patient's tolerance of                            previous anesthesia was also reviewed. The risks                            and benefits of the procedure and the sedation                            options and risks were discussed with the patient.                            All questions were answered, and informed consent                            was obtained. Prior Anticoagulants: The patient has                            taken no previous anticoagulant or antiplatelet                            agents. ASA Grade Assessment: III - A patient with  severe systemic disease. After reviewing the risks                            and benefits, the patient was deemed in                            satisfactory condition to undergo the procedure.                           After obtaining informed consent, the endoscope was                            passed under direct vision. Throughout the                            procedure, the  patient's blood pressure, pulse, and                            oxygen saturations were monitored continuously. The                            Endoscope was introduced through the mouth, and                            advanced to the second part of duodenum. The upper                            GI endoscopy was accomplished without difficulty.                            The patient tolerated the procedure well. Scope In: Scope Out: Findings:                 The examined esophagus was normal. No ring, web,                            stricture, or esophagitis.                           Patchy mild inflammation characterized by erythema,                            friability and granularity was found in the cardia,                            in the gastric fundus and in the gastric antrum.                            Biopsies were taken from the antrum, body, and                            fundus with a cold forceps for histology. Estimated  blood loss was minimal.                           The examined duodenum was normal. Complications:            No immediate complications. Estimated blood loss:                            Minimal. Estimated Blood Loss:     Estimated blood loss was minimal. Impression:               - Normal esophagus.                           - Gastritis. Biopsied.                           - Normal examined duodenum. Recommendation:           - Patient has a contact number available for                            emergencies. The signs and symptoms of potential                            delayed complications were discussed with the                            patient. Return to normal activities tomorrow.                            Written discharge instructions were provided to the                            patient.                           - Resume previous diet.                           - Continue present medications.                            - Await pathology results.                           - No aspirin, ibuprofen, naproxen, or other                            non-steroidal anti-inflammatory drugs. Thornton Park MD, MD 09/18/2021 9:08:35 AM This report has been signed electronically.

## 2021-09-18 NOTE — Patient Instructions (Signed)
Handouts given for Gastritis.  No aspirin, ibuprofen, naproxen, or other non steroidal anti-inflammatories.  Continue present medications.  Await pathology results from Dr. Tarri Glenn  YOU HAD AN ENDOSCOPIC PROCEDURE TODAY AT THE [facility]:   Refer to the procedure report that was given to you for any specific questions about what was found during the examination.  If the procedure report does not answer your questions, please call your gastroenterologist to clarify.  If you requested that your care partner not be given the details of your procedure findings, then the procedure report has been included in a sealed envelope for you to review at your convenience later.  YOU SHOULD EXPECT: Some feelings of bloating in the abdomen. Passage of more gas than usual.  Walking can help get rid of the air that was put into your GI tract during the procedure and reduce the bloating. If you had a lower endoscopy (such as a colonoscopy or flexible sigmoidoscopy) you may notice spotting of blood in your stool or on the toilet paper. If you underwent a bowel prep for your procedure, you may not have a normal bowel movement for a few days.  Please Note:  You might notice some irritation and congestion in your nose or some drainage.  This is from the oxygen used during your procedure.  There is no need for concern and it should clear up in a day or so.  SYMPTOMS TO REPORT IMMEDIATELY:  Following upper endoscopy (EGD)  Vomiting of blood or coffee ground material  New chest pain or pain under the shoulder blades  Painful or persistently difficult swallowing  New shortness of breath  Fever of 100F or higher  Black, tarry-looking stools  For urgent or emergent issues, a gastroenterologist can be reached at any hour by calling [phone]. Do not use MyChart messaging for urgent concerns.    DIET:  We do recommend a small meal at first, but then you may proceed to your regular diet.  Drink plenty of  fluids but you should avoid alcoholic beverages for 24 hours.  ACTIVITY:  You should plan to take it easy for the rest of today and you should NOT DRIVE or use heavy machinery until tomorrow (because of the sedation medicines used during the test).    FOLLOW UP: Our staff will call the number listed on your records 48-72 hours following your procedure to check on you and address any questions or concerns that you may have regarding the information given to you following your procedure. If we do not reach you, we will leave a message.  We will attempt to reach you two times.  During this call, we will ask if you have developed any symptoms of COVID 19. If you develop any symptoms (ie: fever, flu-like symptoms, shortness of breath, cough etc.) before then, please call [phone].  If you test positive for Covid 19 in the 2 weeks post procedure, please call and report this information to [place].    If any biopsies were taken you will be contacted by phone or by letter within the next 1-3 weeks.  Please call us at [phone] if you have not heard about the biopsies in 3 weeks.    SIGNATURES/CONFIDENTIALITY: You and/or your care partner have signed paperwork which will be entered into your electronic medical record.  These signatures attest to the fact that that the information above on your After Visit Summary has been reviewed and is understood.  Full responsibility of the  confidentiality of this discharge information lies with you and/or your care-partner.

## 2021-09-18 NOTE — Progress Notes (Signed)
Indications for EGD: LUQ pain, history of H pylori gastritis  No change in history or PE since office visit with Tye Savoy 09/17/21. She remains an appropriate candidate for monitored anesthesia care in the [facility].

## 2021-09-18 NOTE — Progress Notes (Signed)
Vitals-CW  History reviewed.  Hard stick.

## 2021-09-18 NOTE — Progress Notes (Signed)
Report to PACU, RN, vss, BBS= Clear.  

## 2021-09-18 NOTE — Progress Notes (Signed)
Called to room to assist during endoscopic procedure.  Patient ID and intended procedure confirmed with present staff. Received instructions for my participation in the procedure from the performing physician.  

## 2021-09-22 ENCOUNTER — Telehealth: Payer: Self-pay

## 2021-09-22 NOTE — Telephone Encounter (Signed)
°  Follow up Call-  Call back number 09/18/2021 04/20/2019  Post procedure Call Back phone  # [phone]  Permission to leave phone message Yes Yes  Some recent data might be hidden    Second follow-up call attempt, LVM.

## 2021-09-22 NOTE — Telephone Encounter (Signed)
First follow up call attempt, LVM.

## 2021-09-24 ENCOUNTER — Encounter: Payer: Self-pay | Admitting: Nurse Practitioner

## 2021-09-24 ENCOUNTER — Other Ambulatory Visit: Payer: Self-pay

## 2021-09-24 ENCOUNTER — Telehealth: Payer: Self-pay | Admitting: Gastroenterology

## 2021-09-24 DIAGNOSIS — K2970 Gastritis, unspecified, without bleeding: Secondary | ICD-10-CM

## 2021-09-24 DIAGNOSIS — B9681 Helicobacter pylori [H. pylori] as the cause of diseases classified elsewhere: Secondary | ICD-10-CM

## 2021-09-24 MED ORDER — BISMUTH SUBSALICYLATE 262 MG PO CHEW: 262.0000 mg | CHEWABLE_TABLET | Freq: Four times a day (QID) | ORAL | 0 refills | Status: AC

## 2021-09-24 MED ORDER — TETRACYCLINE HCL 500 MG PO CAPS: 500.0000 mg | ORAL_CAPSULE | Freq: Four times a day (QID) | ORAL | 0 refills | Status: AC

## 2021-09-24 MED ORDER — PANTOPRAZOLE SODIUM 40 MG PO TBEC: 40.0000 mg | DELAYED_RELEASE_TABLET | Freq: Two times a day (BID) | ORAL | 0 refills | Status: DC

## 2021-09-24 MED ORDER — METRONIDAZOLE 500 MG PO TABS: 500.0000 mg | ORAL_TABLET | Freq: Two times a day (BID) | ORAL | 0 refills | Status: AC

## 2021-09-24 NOTE — Telephone Encounter (Signed)
Inbound call from patient asking if she could have FMLA paperwork filled out for a collective of days she have missed from work due to [employer]. Best contact number [phone]

## 2021-09-24 NOTE — Telephone Encounter (Signed)
Addressed in previously created encounter 

## 2021-10-01 ENCOUNTER — Other Ambulatory Visit: Payer: Self-pay

## 2021-10-01 ENCOUNTER — Encounter: Payer: Self-pay | Admitting: Family Medicine

## 2021-10-01 ENCOUNTER — Ambulatory Visit (INDEPENDENT_AMBULATORY_CARE_PROVIDER_SITE_OTHER): Payer: No Typology Code available for payment source | Admitting: Family Medicine

## 2021-10-01 VITALS — BP 110/86 | HR 106 | Wt 313.0 lb

## 2021-10-01 DIAGNOSIS — N6341 Unspecified lump in right breast, subareolar: Secondary | ICD-10-CM | POA: Diagnosis not present

## 2021-10-01 DIAGNOSIS — N644 Mastodynia: Secondary | ICD-10-CM

## 2021-10-01 MED ORDER — GABAPENTIN 300 MG PO CAPS: 300.0000 mg | ORAL_CAPSULE | ORAL | 6 refills | Status: DC

## 2021-10-01 NOTE — Progress Notes (Signed)
° °  Subjective:    Patient ID: Rebecca Day, female    DOB: Dec 24, 1985, 36 y.o.   MRN: [id number]  HPI Patient seen for follow-up of breast pain and a 4 millimeter breast nodule in the right breast approximately 8:00 region about 1 cm from the nipple.  Patient still feels the nodule there.  She does have some breast discomfort from time to time, worse at night.  She has been taking the gabapentin 300 mg twice daily, which has helped with the symptoms.  No side effects to the medication   Review of Systems     Objective:   Physical Exam Vitals reviewed.  Constitutional:      Appearance: Normal appearance.  Chest:    Neurological:     Mental Status: She is alert.       Assessment & Plan:   1. Subareolar mass of right breast   2. Breast pain    Appears relatively unchanged.  We will increase gabapentin at night to see if we can get better control at night.  Patient is otherwise doing well and will follow-up at next annual exam.  She does have a repeat ultrasound and mammography in March.

## 2021-10-01 NOTE — Progress Notes (Signed)
Patient states she feels the gabapentin has helped. Patient is still reporting itching on the right side. Kathrene Alu RN

## 2021-10-05 ENCOUNTER — Other Ambulatory Visit: Payer: Self-pay

## 2021-10-05 ENCOUNTER — Encounter: Payer: Self-pay | Admitting: Nurse Practitioner

## 2021-10-05 ENCOUNTER — Ambulatory Visit (INDEPENDENT_AMBULATORY_CARE_PROVIDER_SITE_OTHER): Payer: No Typology Code available for payment source | Admitting: Nurse Practitioner

## 2021-10-05 VITALS — BP 122/68 | HR 81 | Temp 98.7°F | Ht 68.0 in | Wt 312.4 lb

## 2021-10-05 DIAGNOSIS — Z6841 Body Mass Index (BMI) 40.0 and over, adult: Secondary | ICD-10-CM

## 2021-10-05 DIAGNOSIS — E7800 Pure hypercholesterolemia, unspecified: Secondary | ICD-10-CM

## 2021-10-05 DIAGNOSIS — M329 Systemic lupus erythematosus, unspecified: Secondary | ICD-10-CM

## 2021-10-05 DIAGNOSIS — K2900 Acute gastritis without bleeding: Secondary | ICD-10-CM

## 2021-10-05 DIAGNOSIS — Z0000 Encounter for general adult medical examination without abnormal findings: Secondary | ICD-10-CM | POA: Diagnosis not present

## 2021-10-05 DIAGNOSIS — E8881 Metabolic syndrome: Secondary | ICD-10-CM | POA: Diagnosis not present

## 2021-10-05 DIAGNOSIS — Z114 Encounter for screening for human immunodeficiency virus [HIV]: Secondary | ICD-10-CM

## 2021-10-05 DIAGNOSIS — Z79899 Other long term (current) drug therapy: Secondary | ICD-10-CM

## 2021-10-05 MED ORDER — METFORMIN HCL 500 MG PO TABS: 500.0000 mg | ORAL_TABLET | Freq: Two times a day (BID) | ORAL | 2 refills | Status: DC

## 2021-10-05 NOTE — Progress Notes (Signed)
I,Tianna Badgett,acting as a Education administrator for [employer], FNP.,have documented all relevant documentation on the behalf of [name], FNP,as directed by  [name], FNP while in the presence of [name], [place].   This visit occurred during the SARS-CoV-2 public health emergency.  Safety protocols were in place, including screening questions prior to the visit, additional usage of staff PPE, and extensive cleaning of exam room while observing appropriate contact time as indicated for disinfecting solutions.  Subjective:     Patient ID: Rebecca Day , female    DOB: [birth year] , 36 y.o.   MRN: [id number]   Chief Complaint  Patient presents with   Annual Exam    HPI  Here for HM. She is followed by Dr. Loma Boston OB/[facility]. She is scheduled to have her PAP done in March/April 2023. She has also been diagnosed with H. Pylori. She is on antibiotics for H. Pylori which has been causing GI upset. Metformin alone is not causing her GI upset. She has been to see Dr. Evorn Gong at [facility]. She has also seen a psychiatrist there as well. She feels like her visit there was effective. She seems to be ready to make a change. She has been using myfitnesspal to track her meals.   She is requesting an FMLA form to be completed from when she was out for her Gastritis, she is being followed by GI. January 2 - 9, 2023. Date of Endoscopy - 1/19-1/20/2023  Wt Readings from Last 3 Encounters: 10/05/21 : (!) 312 lb 6.4 oz (141.7 kg) 10/01/21 : (!) 313 lb (142 kg) 09/18/21 : (!) 315 lb (142.9 kg)  She has been working on her weight loss since 2017. She has done keto, mediterenanea, atkins and weight watchers. She has done HIIT, [place], Cardio with walking, resistance training and light weight training.    Past Medical History:  Diagnosis Date   Allergy    Anxiety    Asthma    Blood transfusion without reported diagnosis    as child    Clotting disorder ([place])    blood clot after  last pregnancy    Elevated blood sugar    Kidney stones    Lupus ([place])    questionable, no meds or treatment per pt   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[place])    Ovarian cyst    PMDD (premenstrual dysphoric disorder)      Family History  Problem Relation Age of Onset   Hypertension Mother    Colon polyps Mother    Cancer Father        dx'd in 30's   Colon cancer Father    Kidney disease Brother    Liver disease Brother    Cancer Brother    Hyperlipidemia Maternal Grandmother    Colon cancer Maternal Aunt    Colon cancer Other    Esophageal cancer Neg Hx    Rectal cancer Neg Hx    Stomach cancer Neg Hx      Current Outpatient Medications:    famotidine (PEPCID) 20 MG tablet, Take 1 tablet (20 mg total) by mouth at bedtime., Disp: 30 tablet, Rfl: 3   gabapentin (NEURONTIN) 300 MG capsule, Take 1 capsule (300 mg total) by mouth See admin instructions. 1 cap in AM and 2 at night., Disp: 180 capsule, Rfl: 6   ondansetron (ZOFRAN) 4 MG tablet, ondansetron, Disp: , Rfl:    pantoprazole (PROTONIX) 40 MG tablet, Take 1 tablet (40 mg total) by mouth 2 (two) times  daily for 14 days., Disp: 28 tablet, Rfl: 0   sucralfate (CARAFATE) 1 g tablet, Take 2 tablets (2 g total) by mouth 4 (four) times daily -  with meals and at bedtime., Disp: 240 tablet, Rfl: 1   metFORMIN (GLUCOPHAGE) 500 MG tablet, Take 1 tablet (500 mg total) by mouth 2 (two) times daily with a meal., Disp: 60 tablet, Rfl: 2  Current Facility-Administered Medications:    dextrose 5 % solution, , Intravenous, Once, [place], MD   Allergies  Allergen Reactions   Penicillins Hives    Has patient had a PCN reaction causing immediate rash, facial/tongue/throat swelling, SOB or lightheadedness with hypotension: Yes Has patient had a PCN reaction causing severe rash involving mucus membranes or skin necrosis: No Has patient had a PCN reaction that required hospitalization: Yes Has patient had a PCN reaction occurring within the last 10  years: NO If all of the above answers are "NO", then may proceed with Cephalosporin use.      The patient states she uses none for birth control. She is currently taking megace to decrease her bleeding.  No LMP recorded. (Menstrual status: Other).. Negative for Dysmenorrhea and Negative for Menorrhagia. Negative for: breast discharge, breast lump(s), breast pain and breast self exam. Associated symptoms include abnormal vaginal bleeding. Pertinent negatives include abnormal bleeding (hematology), anxiety, decreased libido, depression, difficulty falling sleep, dyspareunia, history of infertility, nocturia, sexual dysfunction, sleep disturbances, urinary incontinence, urinary urgency, vaginal discharge and vaginal itching. Diet regular; she is cutting out the carbs and sugars due to the prediabetes. The patient states her exercise level is minimal will walk a few times a week 30-45 minutes.   . The patient's tobacco use is:  Social History   Tobacco Use  Smoking Status Never  Smokeless Tobacco Never  . She has been exposed to passive smoke. The patient's alcohol use is:  Social History   Substance and Sexual Activity  Alcohol Use Yes   Comment: socially   Additional information: Last pap 2021, next one scheduled for 2024.    Review of Systems  Constitutional: Negative.  Negative for fatigue.  HENT: Negative.    Eyes: Negative.   Respiratory: Negative.    Cardiovascular: Negative.  Negative for chest pain, palpitations and leg swelling.  Gastrointestinal: Negative.   Endocrine: Negative.   Genitourinary: Negative.   Musculoskeletal: Negative.   Skin: Negative.   Allergic/Immunologic: Negative.   Neurological: Negative.   Hematological: Negative.   Psychiatric/Behavioral: Negative.      Today's Vitals   10/05/21 1010  BP: 122/68  Pulse: 81  Temp: 98.7 F (37.1 C)  TempSrc: Oral  Weight: (!) 312 lb 6.4 oz (141.7 kg)  Height: 5\' 8"  (1.727 m)   Body mass index is 47.5 kg/m.    Objective:  Physical Exam Constitutional:      General: She is not in acute distress.    Appearance: Normal appearance. She is well-developed. She is obese.  HENT:     Head: Normocephalic and atraumatic.     Right Ear: Hearing, tympanic membrane, ear canal and external ear normal. There is no impacted cerumen.     Left Ear: Hearing, tympanic membrane, ear canal and external ear normal. There is no impacted cerumen.     Nose:     Comments: Deferred - masked    Mouth/Throat:     Comments: Deferred - masked Eyes:     General: Lids are normal.     Extraocular Movements: Extraocular movements intact.  Conjunctiva/sclera: Conjunctivae normal.     Pupils: Pupils are equal, round, and reactive to light.     Funduscopic exam:    Right eye: No papilledema.        Left eye: No papilledema.  Neck:     Thyroid: No thyroid mass.     Vascular: No carotid bruit.  Cardiovascular:     Rate and Rhythm: Normal rate and regular rhythm.     Pulses: Normal pulses.     Heart sounds: Normal heart sounds. No murmur heard. Pulmonary:     Effort: Pulmonary effort is normal. No respiratory distress.     Breath sounds: Normal breath sounds. No wheezing.  Chest:     Chest wall: No mass.  Breasts:    Tanner Score is 5.     Right: Normal. No mass or tenderness.     Left: Normal. No mass or tenderness.  Abdominal:     General: Abdomen is flat. Bowel sounds are normal. There is no distension.     Palpations: Abdomen is soft.     Tenderness: There is no abdominal tenderness.  Genitourinary:    Comments: Deferred - followed by Gyn, breast exam deferred done by Dr. Nehemiah Settle and has had a mammogram due to lump to right breast Musculoskeletal:        General: No swelling. Normal range of motion.     Cervical back: Full passive range of motion without pain, normal range of motion and neck supple.     Right lower leg: No edema.     Left lower leg: No edema.  Lymphadenopathy:     Upper Body:     Right  upper body: No supraclavicular, axillary or pectoral adenopathy.     Left upper body: No supraclavicular, axillary or pectoral adenopathy.  Skin:    General: Skin is warm and dry.     Capillary Refill: Capillary refill takes less than 2 seconds.  Neurological:     General: No focal deficit present.     Mental Status: She is alert and oriented to person, place, and time.     Cranial Nerves: No cranial nerve deficit.     Sensory: No sensory deficit.     Motor: No weakness.  Psychiatric:        Mood and Affect: Mood normal.        Behavior: Behavior normal.        Thought Content: Thought content normal.        Judgment: Judgment normal.        Assessment And Plan:     1. Encounter for annual physical exam Behavior modifications discussed and diet history reviewed.   Pt will continue to exercise regularly and modify diet with low GI, plant based foods and decrease intake of processed foods.  Recommend intake of daily multivitamin, Vitamin D, and calcium.  Recommend for preventive screenings, as well as recommend immunizations that include influenza, TDAP  2. Class 3 severe obesity due to excess calories without serious comorbidity with body mass index (BMI) of 45.0 to 49.9 in adult [facility]) Chronic Discussed healthy diet and regular exercise options  Encouraged to exercise at least 150 minutes per week with 2 days of strength training She has left a form for medical necessity for weight loss surgery she has been unsuccessful in her weight loss journey.  - Amb Referral to Bariatric Surgery  3. Encounter for HIV (human immunodeficiency virus) test - HIV antibody (with reflex)  4. Insulin resistance Comments: She  is tolerating metformin at this time, encouraged to limit intake of sugary foods and drinks.  - Hemoglobin A1c - metFORMIN (GLUCOPHAGE) 500 MG tablet; Take 1 tablet (500 mg total) by mouth 2 (two) times daily with a meal.  Dispense: 60 tablet; Refill: 2 - Insulin,  random  5. Elevated cholesterol Comments: Diet controlled, eat low fat diet. - Lipid panel  6. Other long term (current) drug therapy - CBC  7. Acute gastritis without hemorrhage, unspecified gastritis type Comments: Continues to be persistent, she is also being treated again for H Pylori  8. Lupus ([place]) Continue follow up with Rheumatology    Patient was given opportunity to ask questions. Patient verbalized understanding of the plan and was able to repeat key elements of the plan. All questions were answered to their satisfaction.   [name], FNP   I, [name], FNP, have reviewed all documentation for this visit. The documentation on 10/15/21 for the exam, diagnosis, procedures, and orders are all accurate and complete.  THE PATIENT IS ENCOURAGED TO PRACTICE SOCIAL DISTANCING DUE TO THE COVID-19 PANDEMIC.

## 2021-10-05 NOTE — Patient Instructions (Signed)

## 2021-10-06 LAB — LIPID PANEL
Chol/HDL Ratio: 5.2 ratio — ABNORMAL HIGH (ref 0.0–4.4)
Cholesterol, Total: 224 mg/dL — ABNORMAL HIGH (ref 100–199)
HDL: 43 mg/dL (ref >39)
LDL Chol Calc (NIH): 167 mg/dL — ABNORMAL HIGH (ref 0–99)
Triglycerides: 79 mg/dL (ref 0–149)
VLDL Cholesterol Cal: 14 mg/dL (ref 5–40)

## 2021-10-06 LAB — CBC
Hematocrit: 42.9 % (ref 34.0–46.6)
Hemoglobin: 14.6 g/dL (ref 11.1–15.9)
MCH: 29.7 pg (ref 26.6–33.0)
MCHC: 34 g/dL (ref 31.5–35.7)
MCV: 87 fL (ref 79–97)
Platelets: 350 10*3/uL (ref 150–450)
RBC: 4.92 x10E6/uL (ref 3.77–5.28)
RDW: 13 % (ref 11.7–15.4)
WBC: 7.1 10*3/uL (ref 3.4–10.8)

## 2021-10-06 LAB — HEMOGLOBIN A1C
Est. average glucose Bld gHb Est-mCnc: 117 mg/dL
Hgb A1c MFr Bld: 5.7 % — ABNORMAL HIGH (ref 4.8–5.6)

## 2021-10-06 LAB — HIV ANTIBODY (ROUTINE TESTING W REFLEX): HIV Screen 4th Generation wRfx: NONREACTIVE

## 2021-10-06 LAB — INSULIN, RANDOM: INSULIN: 90.6 u[IU]/mL — ABNORMAL HIGH (ref 2.6–24.9)

## 2021-10-14 ENCOUNTER — Encounter: Payer: Self-pay | Admitting: Nurse Practitioner

## 2021-10-22 ENCOUNTER — Encounter: Payer: Self-pay | Admitting: Nurse Practitioner

## 2021-11-18 ENCOUNTER — Other Ambulatory Visit: Payer: Self-pay

## 2021-11-18 ENCOUNTER — Ambulatory Visit
Admission: RE | Admit: 2021-11-18 | Discharge: 2021-11-18 | Disposition: A | Discharge status: Home / Self Care | Payer: No Typology Code available for payment source | Source: Ambulatory Visit | Attending: Family Medicine | Admitting: Family Medicine

## 2021-11-18 DIAGNOSIS — N644 Mastodynia: Secondary | ICD-10-CM

## 2021-11-18 DIAGNOSIS — N63 Unspecified lump in unspecified breast: Secondary | ICD-10-CM

## 2021-12-02 ENCOUNTER — Encounter: Payer: Self-pay | Admitting: Family Medicine

## 2021-12-02 ENCOUNTER — Ambulatory Visit (INDEPENDENT_AMBULATORY_CARE_PROVIDER_SITE_OTHER): Payer: No Typology Code available for payment source | Admitting: Family Medicine

## 2021-12-02 ENCOUNTER — Other Ambulatory Visit (HOSPITAL_COMMUNITY)
Admission: RE | Admit: 2021-12-02 | Discharge: 2021-12-02 | Disposition: A | Discharge status: Home / Self Care | Payer: No Typology Code available for payment source | Source: Ambulatory Visit | Attending: Family Medicine | Admitting: Family Medicine

## 2021-12-02 VITALS — BP 109/70 | HR 95 | Ht 68.0 in | Wt 315.0 lb

## 2021-12-02 DIAGNOSIS — N6341 Unspecified lump in right breast, subareolar: Secondary | ICD-10-CM | POA: Diagnosis not present

## 2021-12-02 DIAGNOSIS — N644 Mastodynia: Secondary | ICD-10-CM

## 2021-12-02 DIAGNOSIS — N939 Abnormal uterine and vaginal bleeding, unspecified: Secondary | ICD-10-CM | POA: Diagnosis not present

## 2021-12-02 DIAGNOSIS — Z01419 Encounter for gynecological examination (general) (routine) without abnormal findings: Secondary | ICD-10-CM

## 2021-12-02 DIAGNOSIS — Z86718 Personal history of other venous thrombosis and embolism: Secondary | ICD-10-CM

## 2021-12-02 NOTE — Progress Notes (Signed)
? ? ?GYNECOLOGY ANNUAL PREVENTATIVE CARE ENCOUNTER NOTE ? ?Subjective:  ? Rebecca Day is a 36 y.o. [id number] female here for a routine annual gynecologic exam.  Current complaints: Having some uterine cramping and brown discharge. Her daughter has been in the hospital and missed several doses of her megace. Gabapentin is working well to reduce breast pain.   Denies abnormal vaginal bleeding, discharge, pelvic pain, problems with intercourse or other gynecologic concerns.  ?  ?Gynecologic History ?No LMP recorded. (Menstrual status: Other). ?Patient is sexually active  ?Contraception: condoms ?Last Pap: 04/2020. Results were: normal ?Last mammogram: 10/2021. Results were: birads 3 ? ? ?The pregnancy intention screening data noted above was reviewed. Potential methods of contraception were discussed. The patient elected to proceed with No data recorded. ? ? ?Obstetric History ?OB History  ?Gravida Para Term Preterm AB Living  ?'11 4 3 '$ 0 6 2  ?SAB IAB Ectopic Multiple Live Births  ?6 0 0 0 2  ?  ?# Outcome Date GA Lbr Len/2nd Weight Sex Delivery Anes PTL Lv  ?11 Para 2014 109w0d  F CS-LTranv Spinal N LIV  ?10 Term 2010 427w0d F CS-LTranv Spinal N LIV  ?9 SAB           ?8 SAB           ?7 SAB           ?6 SAB           ?5 Gravida           ?4 Term           ?3 SAB           ?2 Term           ?1 SAB           ? ? ?Past Medical History:  ?Diagnosis Date  ? Allergy   ? Anxiety   ? Asthma   ? Blood transfusion without reported diagnosis   ? as child   ? [place]  ? blood clot after last pregnancy   ? Elevated blood sugar   ? Kidney stones   ? [place]  ? questionable, no meds or treatment per pt  ? 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  ? Ovarian cyst   ? PMDD (premenstrual dysphoric disorder)   ? ? ?Past Surgical History:  ?Procedure Laterality Date  ? CESAREAN SECTION    ? C/S x 2  ? PILONIDAL CYST DRAINAGE    ? ? ?Current Outpatient Medications on File Prior to Visit  ?Medication Sig Dispense Refill  ? famotidine  (PEPCID) 20 MG tablet Take 1 tablet (20 mg total) by mouth at bedtime. 30 tablet 3  ? gabapentin (NEURONTIN) 300 MG capsule Take 1 capsule (300 mg total) by mouth See admin instructions. 1 cap in AM and 2 at night. 180 capsule 6  ? megestrol (MEGACE) 40 MG tablet Take 40 mg by mouth daily. Patient is taking 40 mg twice a day.    ? metFORMIN (GLUCOPHAGE) 500 MG tablet Take 1 tablet (500 mg total) by mouth 2 (two) times daily with a meal. 60 tablet 2  ? ondansetron (ZOFRAN) 4 MG tablet ondansetron    ? sucralfate (CARAFATE) 1 g tablet Take 2 tablets (2 g total) by mouth 4 (four) times daily -  with meals and at bedtime. 240 tablet 1  ? pantoprazole (PROTONIX) 40 MG tablet Take 1 tablet (40 mg total) by mouth 2 (  two) times daily for 14 days. 28 tablet 0  ? ?Current Facility-Administered Medications on File Prior to Visit  ?Medication Dose Route Frequency Provider Last Rate Last Admin  ? dextrose 5 % solution   Intravenous Once Thornton Park, MD      ? ? ?Allergies  ?Allergen Reactions  ? Penicillins Hives  ?  Has patient had a PCN reaction causing immediate rash, facial/tongue/throat swelling, SOB or lightheadedness with hypotension: Yes ?Has patient had a PCN reaction causing severe rash involving mucus membranes or skin necrosis: No ?Has patient had a PCN reaction that required hospitalization: Yes ?Has patient had a PCN reaction occurring within the last 10 years: NO ?If all of the above answers are "NO", then may proceed with Cephalosporin use.  ? ? ?Social History  ? ?Socioeconomic History  ? Marital status: Single  ?  Spouse name: Not on file  ? Number of children: Not on file  ? Years of education: Not on file  ? Highest education level: Not on file  ?Occupational History  ? Not on file  ?Tobacco Use  ? Smoking status: Never  ? Smokeless tobacco: Never  ?Vaping Use  ? Vaping Use: Never used  ?Substance and Sexual Activity  ? Alcohol use: Yes  ?  Comment: socially  ? Drug use: No  ? Sexual activity: Not on  file  ?Other Topics Concern  ? Not on file  ?Social History Narrative  ? Not on file  ? ?Social Determinants of Health  ? ?Financial Resource Strain: Not on file  ?Food Insecurity: Not on file  ?Transportation Needs: Not on file  ?Physical Activity: Not on file  ?Stress: Not on file  ?Social Connections: Not on file  ?Intimate Partner Violence: Not on file  ? ? ?Family History  ?Problem Relation Age of Onset  ? Hypertension Mother   ? Colon polyps Mother   ? Cancer Father   ?     dx'd in 81's  ? Colon cancer Father   ? Colon cancer Maternal Aunt   ? Hyperlipidemia Maternal Grandmother   ? Kidney disease Brother   ? Liver disease Brother   ? Cancer Brother   ? Colon cancer Other   ? Esophageal cancer Neg Hx   ? Rectal cancer Neg Hx   ? Stomach cancer Neg Hx   ? Breast cancer Neg Hx   ? ? ?The following portions of the patient's history were reviewed and updated as appropriate: allergies, current medications, past family history, past medical history, past social history, past surgical history and problem list. ? ?Review of Systems ?Pertinent items are noted in HPI. ?  ?Objective:  ?BP 109/70   Pulse 95   Ht '5\' 8"'$  (1.727 m)   Wt (!) 315 lb (142.9 kg)   BMI 47.90 kg/m?  ?Wt Readings from Last 3 Encounters:  ?12/02/21 (!) 315 lb (142.9 kg)  ?10/05/21 (!) 312 lb 6.4 oz (141.7 kg)  ?10/01/21 (!) 313 lb (142 kg)  ?  ? ?Chaperone present during exam ? ?CONSTITUTIONAL: Well-developed, well-nourished female in no acute distress.  ?HENT:  Normocephalic, atraumatic, External right and left ear normal. Oropharynx is clear and moist ?EYES: Conjunctivae and EOM are normal. Pupils are equal, round, and reactive to light. No scleral icterus.  ?NECK: Normal range of motion, supple, no masses.  Normal thyroid.  ? ?CARDIOVASCULAR: Normal heart rate noted, regular rhythm ?RESPIRATORY: Clear to auscultation bilaterally. Effort and breath sounds normal, no problems with respiration noted. ?BREASTS: Symmetric  in size. No skin changes,  nipple drainage, or lymphadenopathy. 18m nodule about 1cm from the nipple at 8 o'clock. Mildly tender ?ABDOMEN: Soft, normal bowel sounds, no distention noted.  No tenderness, rebound or guarding.  ?PELVIC: Normal appearing external genitalia; normal appearing vaginal mucosa and cervix.  No abnormal discharge noted.  Normal uterine size, no other palpable masses, no uterine or adnexal tenderness. ?MUSCULOSKELETAL: Normal range of motion. No tenderness.  No cyanosis, clubbing, or edema.  2+ distal pulses. ?SKIN: Skin is warm and dry. No rash noted. Not diaphoretic. No erythema. No pallor. ?NEUROLOGIC: Alert and oriented to person, place, and time. Normal reflexes, muscle tone coordination. No cranial nerve deficit noted. ?PSYCHIATRIC: Normal mood and affect. Normal behavior. Normal judgment and thought content. ? ?Assessment:  ?Annual gynecologic examination with pap smear ?  ?Plan:  ?1. Well Woman Exam ?Will follow up results of pap smear and manage accordingly. ?STD testing discussed. Patient requested testing ? ?2. Breast pain ?Improved with gabapentin ? ?3. Subareolar mass of right breast ?Stable. Has rpt mammogram in 1 year ? ?4. Abnormal uterine bleeding (AUB) ?Improved with megace. Having some increased from missing doses. ? ?5. History of blood clots ?No estrogen containing products ? ? ?Routine preventative health maintenance measures emphasized. ?Please refer to After Visit Summary for other counseling recommendations.  ? ? ?JLoma Boston DO ?[facility]?

## 2021-12-03 LAB — CYTOLOGY - PAP
Adequacy: ABSENT
Chlamydia: NEGATIVE
Comment: NEGATIVE
Comment: NEGATIVE
Comment: NORMAL
Diagnosis: NEGATIVE
High risk HPV: NEGATIVE
Neisseria Gonorrhea: NEGATIVE

## 2021-12-04 ENCOUNTER — Encounter (HOSPITAL_BASED_OUTPATIENT_CLINIC_OR_DEPARTMENT_OTHER): Payer: Self-pay | Admitting: Emergency Medicine

## 2021-12-04 ENCOUNTER — Other Ambulatory Visit: Payer: Self-pay

## 2021-12-04 ENCOUNTER — Emergency Department (HOSPITAL_BASED_OUTPATIENT_CLINIC_OR_DEPARTMENT_OTHER): Payer: No Typology Code available for payment source | Admitting: Radiology

## 2021-12-04 ENCOUNTER — Emergency Department (HOSPITAL_BASED_OUTPATIENT_CLINIC_OR_DEPARTMENT_OTHER): Payer: No Typology Code available for payment source

## 2021-12-04 ENCOUNTER — Emergency Department (HOSPITAL_BASED_OUTPATIENT_CLINIC_OR_DEPARTMENT_OTHER)
Admission: EM | Admit: 2021-12-04 | Discharge: 2021-12-04 | Disposition: A | Discharge status: Home / Self Care | Payer: No Typology Code available for payment source | Attending: Emergency Medicine | Admitting: Emergency Medicine

## 2021-12-04 DIAGNOSIS — Y9389 Activity, other specified: Secondary | ICD-10-CM | POA: Insufficient documentation

## 2021-12-04 DIAGNOSIS — Y9289 Other specified places as the place of occurrence of the external cause: Secondary | ICD-10-CM | POA: Diagnosis not present

## 2021-12-04 DIAGNOSIS — M546 Pain in thoracic spine: Secondary | ICD-10-CM | POA: Diagnosis present

## 2021-12-04 DIAGNOSIS — W010XXA Fall on same level from slipping, tripping and stumbling without subsequent striking against object, initial encounter: Secondary | ICD-10-CM | POA: Diagnosis not present

## 2021-12-04 MED ORDER — TRAMADOL HCL 50 MG PO TABS: 50.0000 mg | ORAL_TABLET | Freq: Four times a day (QID) | ORAL | 0 refills | Status: DC | PRN

## 2021-12-04 MED ORDER — LIDOCAINE 5 % EX PTCH: 1.0000 | MEDICATED_PATCH | CUTANEOUS | 0 refills | Status: DC

## 2021-12-04 MED ORDER — KETOROLAC TROMETHAMINE 60 MG/2ML IM SOLN
60.0000 mg | Freq: Once | INTRAMUSCULAR | Status: AC
  Administered 2021-12-04: 60 mg via INTRAMUSCULAR
  Filled 2021-12-04: qty 2

## 2021-12-04 NOTE — ED Provider Notes (Signed)
?[facility] ?Provider Note ? ? ?CSN: [id number] ?Arrival date & time: 12/04/21  0818 ? ?  ? ?History ? ?Chief Complaint  ?Patient presents with  ? Back Pain  ? ? ?Rebecca Day is a 36 y.o. female. ? ?Patient is a 36 year old female who presents with back pain.  She says been worsening over the last 4 days.  It started when she was trying to change a light bulb in the laundry room and slipped and fell, landing on her back.  She thinks she may have hit the area on open dryer door.  She says the pain is worse with movement.  She denies any associated dental pain.  No fevers.  No nausea or vomiting.  No associated numbness or weakness to her legs.  It radiates down her back but no radiation down her legs.  No associate abdominal pain.  No shortness of breath.  She has been using ibuprofen and Tylenol without symptomatic relief. ? ? ?  ? ?Home Medications ?Prior to Admission medications   ?Medication Sig Start Date End Date Taking? Authorizing Provider  ?traMADol (ULTRAM) 50 MG tablet Take 1 tablet (50 mg total) by mouth every 6 (six) hours as needed. 12/04/21  Yes Malvin Johns, MD  ?famotidine (PEPCID) 20 MG tablet Take 1 tablet (20 mg total) by mouth at bedtime. 09/17/21   Willia Craze, NP  ?gabapentin (NEURONTIN) 300 MG capsule Take 1 capsule (300 mg total) by mouth See admin instructions. 1 cap in AM and 2 at night. 10/01/21   Truett Mainland, DO  ?megestrol (MEGACE) 40 MG tablet Take 40 mg by mouth daily. Patient is taking 40 mg twice a day.    [provider]  ?metFORMIN (GLUCOPHAGE) 500 MG tablet Take 1 tablet (500 mg total) by mouth 2 (two) times daily with a meal. 10/05/21 10/05/22  [name], [place]  ?ondansetron (ZOFRAN) 4 MG tablet ondansetron    [provider]  ?pantoprazole (PROTONIX) 40 MG tablet Take 1 tablet (40 mg total) by mouth 2 (two) times daily for 14 days. 09/24/21 10/08/21  Thornton Park, MD  ?sucralfate (CARAFATE) 1 g tablet Take 2 tablets (2 g  total) by mouth 4 (four) times daily -  with meals and at bedtime. 09/17/21   Willia Craze, NP  ?   ? ?Allergies    ?Penicillins   ? ?Review of Systems   ?Review of Systems  ?Constitutional:  Negative for chills, diaphoresis, fatigue and fever.  ?HENT:  Negative for congestion, rhinorrhea and sneezing.   ?Eyes: Negative.   ?Respiratory:  Negative for cough, chest tightness and shortness of breath.   ?Cardiovascular:  Negative for chest pain and leg swelling.  ?Gastrointestinal:  Negative for abdominal pain, blood in stool, diarrhea, nausea and vomiting.  ?Genitourinary:  Negative for difficulty urinating, flank pain, frequency and hematuria.  ?Musculoskeletal:  Positive for back pain. Negative for arthralgias.  ?Skin:  Negative for rash.  ?Neurological:  Negative for dizziness, speech difficulty, weakness, numbness and headaches.  ? ?Physical Exam ?Updated Vital Signs ?BP 121/73 (BP Location: Right Arm)   Pulse 93   Temp 97.9 ?F (36.6 ?C) (Oral)   Resp 16   Ht '5\' 9"'$  (1.753 m)   Wt (!) 158.8 kg   SpO2 99%   BMI 51.69 kg/m?  ?Physical Exam ?Constitutional:   ?   Appearance: She is well-developed. She is obese.  ?HENT:  ?   Head: Normocephalic and atraumatic.  ?Eyes:  ?  Pupils: Pupils are equal, round, and reactive to light.  ?Cardiovascular:  ?   Rate and Rhythm: Normal rate and regular rhythm.  ?   Heart sounds: Normal heart sounds.  ?Pulmonary:  ?   Effort: Pulmonary effort is normal. No respiratory distress.  ?   Breath sounds: Normal breath sounds. No wheezing or rales.  ?Chest:  ?   Chest wall: No tenderness.  ?Abdominal:  ?   General: Bowel sounds are normal.  ?   Palpations: Abdomen is soft.  ?   Tenderness: There is no abdominal tenderness. There is no guarding or rebound.  ?Musculoskeletal:     ?   General: Normal range of motion.  ?   Cervical back: Normal range of motion and neck supple.  ?   Comments: Positive tenderness in the mid thoracic spine.  There is also some tenderness over the left  paraspinal area.  No visible signs of external trauma.  No crepitus or deformity.  ?Lymphadenopathy:  ?   Cervical: No cervical adenopathy.  ?Skin: ?   General: Skin is warm and dry.  ?   Findings: No rash.  ?Neurological:  ?   Mental Status: She is alert and oriented to person, place, and time.  ? ? ?ED Results / Procedures / Treatments   ?Labs ?(all labs ordered are listed, but only abnormal results are displayed) ?Labs Reviewed  ?PREGNANCY, URINE  ? ? ?EKG ?None ? ?Radiology ?DG Ribs Unilateral W/Chest Left ? ?Result Date: 12/04/2021 ?CLINICAL DATA:  Fall, rib pain EXAM: LEFT RIBS AND CHEST - 3+ VIEW COMPARISON:  None. FINDINGS: No fracture or other bone lesions are seen involving the ribs. There is no evidence of pneumothorax or pleural effusion. Both lungs are clear. Heart size and mediastinal contours are within normal limits. IMPRESSION: Negative. Electronically Signed   By: Macy Mis M.D.   On: 12/04/2021 09:41  ? ?CT Thoracic Spine Wo Contrast ? ?Result Date: 12/04/2021 ?CLINICAL DATA:  Back trauma, patient fell 3 days ago. EXAM: CT THORACIC SPINE WITHOUT CONTRAST TECHNIQUE: Multidetector CT images of the thoracic were obtained using the standard protocol without intravenous contrast. RADIATION DOSE REDUCTION: This exam was performed according to the departmental dose-optimization program which includes automated exposure control, adjustment of the mA and/or kV according to patient size and/or use of iterative reconstruction technique. COMPARISON:  None. FINDINGS: Alignment: Normal. Vertebrae: No acute fracture or focal pathologic process. Paraspinal and other soft tissues: Negative. Disc levels: Disc spaces are maintained. No significant disc bulge spinal canal or neural foraminal stenosis. IMPRESSION: 1.  No evidence of fracture or subluxation. 2.  No significant paraspinal soft tissue injury. Electronically Signed   By: Keane Police D.O.   On: 12/04/2021 09:38   ? ?Procedures ?Procedures   ? ? ?Medications Ordered in ED ?Medications  ?ketorolac (TORADOL) injection 60 mg (60 mg Intramuscular Given 12/04/21 0911)  ? ? ?ED Course/ Medical Decision Making/ A&P ?  ?                        ?Medical Decision Making ?Amount and/or Complexity of Data Reviewed ?Labs: ordered. ?Radiology: ordered. ? ?Risk ?Prescription drug management. ? ? ?Patient is a 36 year old female who presents with back pain.  She has tenderness to her left mid thoracic area.  This happened after a fall.  She had x-rays of her left ribs which were interpreted by me to show no acute abnormalities.  No pneumothorax.  No rib fracture.  Given her increased size, I did not feel that x-rays of her thoracic spine would be helpful.  He did a CT scan of her thoracic spine which showed no acute bony abnormality.  She was given dose of Toradol and feels better after this.  She was requesting something stronger for pain and she cannot take a lot of NSAIDs because of gastritis.  She was given a small prescription for tramadol and Lidoderm patches.  She was encouraged to follow-up with her PCP if her symptoms are not improving.  Return precautions were given. ? ?Final Clinical Impression(s) / ED Diagnoses ?Final diagnoses:  ?Acute left-sided thoracic back pain  ? ? ?Rx / DC Orders ?ED Discharge Orders   ? ?      Ordered  ?  traMADol (ULTRAM) 50 MG tablet  Every 6 hours PRN       ? 12/04/21 1033  ? ?  ?  ? ?  ? ? ?  ?Malvin Johns, MD ?12/04/21 1035 ? ?

## 2021-12-04 NOTE — ED Notes (Signed)
Patient transported to X-ray 

## 2021-12-04 NOTE — ED Triage Notes (Signed)
Pt started experiencing back pain since Tuesday and it has been getting progressively worse.  ?

## 2021-12-07 ENCOUNTER — Telehealth: Payer: Self-pay

## 2021-12-07 NOTE — Telephone Encounter (Signed)
Transition Care Management Unsuccessful Follow-up Telephone Call ? ?Date of discharge and from where:  12/04/2021 [facility]  ? ?Attempts:  1st Attempt ? ?Reason for unsuccessful TCM follow-up call:  Left voice message ? ?  ?

## 2021-12-17 ENCOUNTER — Encounter: Payer: Self-pay | Admitting: Gastroenterology

## 2021-12-25 DIAGNOSIS — E559 Vitamin D deficiency, unspecified: Secondary | ICD-10-CM | POA: Insufficient documentation

## 2021-12-29 ENCOUNTER — Other Ambulatory Visit: Payer: Self-pay | Admitting: Nurse Practitioner

## 2021-12-29 DIAGNOSIS — B9681 Helicobacter pylori [H. pylori] as the cause of diseases classified elsewhere: Secondary | ICD-10-CM

## 2022-01-07 DIAGNOSIS — R7303 Prediabetes: Secondary | ICD-10-CM | POA: Insufficient documentation

## 2022-01-20 ENCOUNTER — Other Ambulatory Visit: Payer: Self-pay | Admitting: Nurse Practitioner

## 2022-01-28 HISTORY — PX: LAPAROSCOPIC GASTRIC SLEEVE RESECTION: SHX5895

## 2022-02-08 ENCOUNTER — Telehealth: Payer: Self-pay

## 2022-02-08 NOTE — Telephone Encounter (Signed)
Lvm to check on patient.

## 2022-02-16 ENCOUNTER — Emergency Department (HOSPITAL_BASED_OUTPATIENT_CLINIC_OR_DEPARTMENT_OTHER)
Admission: EM | Admit: 2022-02-16 | Discharge: 2022-02-17 | Disposition: A | Discharge status: Home / Self Care | Payer: No Typology Code available for payment source | Attending: Emergency Medicine | Admitting: Emergency Medicine

## 2022-02-16 ENCOUNTER — Encounter (HOSPITAL_BASED_OUTPATIENT_CLINIC_OR_DEPARTMENT_OTHER): Payer: Self-pay

## 2022-02-16 DIAGNOSIS — R110 Nausea: Secondary | ICD-10-CM | POA: Diagnosis not present

## 2022-02-16 DIAGNOSIS — E860 Dehydration: Secondary | ICD-10-CM | POA: Insufficient documentation

## 2022-02-16 DIAGNOSIS — J45909 Unspecified asthma, uncomplicated: Secondary | ICD-10-CM | POA: Diagnosis not present

## 2022-02-16 DIAGNOSIS — R55 Syncope and collapse: Secondary | ICD-10-CM | POA: Diagnosis present

## 2022-02-16 DIAGNOSIS — Z87442 Personal history of urinary calculi: Secondary | ICD-10-CM | POA: Insufficient documentation

## 2022-02-16 LAB — CBC
HCT: 40.3 % (ref 36.0–46.0)
Hemoglobin: 13.3 g/dL (ref 12.0–15.0)
MCH: 29 pg (ref 26.0–34.0)
MCHC: 33 g/dL (ref 30.0–36.0)
MCV: 88 fL (ref 80.0–100.0)
Platelets: 285 10*3/uL (ref 150–400)
RBC: 4.58 MIL/uL (ref 3.87–5.11)
RDW: 13.3 % (ref 11.5–15.5)
WBC: 5.9 10*3/uL (ref 4.0–10.5)
nRBC: 0 % (ref 0.0–0.2)

## 2022-02-16 LAB — URINALYSIS, ROUTINE W REFLEX MICROSCOPIC
Glucose, UA: NEGATIVE mg/dL
Ketones, ur: >80 mg/dL — AB
Nitrite: NEGATIVE
Protein, ur: 100 mg/dL — AB
Specific Gravity, Urine: 1.032 — ABNORMAL HIGH (ref 1.005–1.030)
pH: 6 (ref 5.0–8.0)

## 2022-02-16 LAB — CBG MONITORING, ED: Glucose-Capillary: 100 mg/dL — ABNORMAL HIGH (ref 70–99)

## 2022-02-16 LAB — PREGNANCY, URINE: Preg Test, Ur: NEGATIVE

## 2022-02-16 LAB — BASIC METABOLIC PANEL
Anion gap: 12 (ref 5–15)
BUN: <5 mg/dL — ABNORMAL LOW (ref 6–20)
CO2: 20 mmol/L — ABNORMAL LOW (ref 22–32)
Calcium: 10.1 mg/dL (ref 8.9–10.3)
Chloride: 106 mmol/L (ref 98–111)
Creatinine, Ser: 0.77 mg/dL (ref 0.44–1.00)
GFR, Estimated: >60 mL/min (ref >60)
Glucose, Bld: 102 mg/dL — ABNORMAL HIGH (ref 70–99)
Potassium: 3.6 mmol/L (ref 3.5–5.1)
Sodium: 138 mmol/L (ref 135–145)

## 2022-02-16 LAB — TROPONIN I (HIGH SENSITIVITY)
Troponin I (High Sensitivity): 5 ng/L (ref <18)
Troponin I (High Sensitivity): 5 ng/L (ref <18)

## 2022-02-16 MED ORDER — SODIUM CHLORIDE 0.9 % IV BOLUS
1000.0000 mL | Freq: Once | INTRAVENOUS | Status: AC
Start: 2022-02-16 — End: 2022-02-16
  Administered 2022-02-16: 1000 mL via INTRAVENOUS

## 2022-02-16 MED ORDER — DIPHENHYDRAMINE HCL 50 MG/ML IJ SOLN
50.0000 mg | Freq: Once | INTRAMUSCULAR | Status: AC
  Administered 2022-02-16: 50 mg via INTRAVENOUS
  Filled 2022-02-16: qty 1

## 2022-02-16 MED ORDER — PROCHLORPERAZINE EDISYLATE 10 MG/2ML IJ SOLN
10.0000 mg | Freq: Once | INTRAMUSCULAR | Status: AC
  Administered 2022-02-16: 10 mg via INTRAVENOUS
  Filled 2022-02-16: qty 2

## 2022-02-16 NOTE — ED Triage Notes (Signed)
Pt states she LOC walking from bathroom to bedroom landing on her bed.  Similar episode Saturday and was seen in ED @ [facility] Recently had gastric bypass sx 01/28/22 + nausea and dizzy

## 2022-02-16 NOTE — ED Provider Notes (Signed)
[facility] Provider Note   CSN: [id number] Arrival date & time: 02/16/22  1959     History {Add pertinent medical, surgical, social history, OB history to HPI:1} Chief Complaint  Patient presents with   Loss of Consciousness    Rebecca Day is a 36 y.o. female.   Loss of Consciousness      Home Medications Prior to Admission medications   Medication Sig Start Date End Date Taking? Authorizing Provider  famotidine (PEPCID) 20 MG tablet TAKE 1 TABLET BY MOUTH EVERYDAY AT BEDTIME 01/20/22   Willia Craze, NP  gabapentin (NEURONTIN) 300 MG capsule Take 1 capsule (300 mg total) by mouth See admin instructions. 1 cap in AM and 2 at night. 10/01/21   Truett Mainland, DO  lidocaine (LIDODERM) 5 % Place 1 patch onto the skin daily. Remove & Discard patch within 12 hours or as directed by MD 12/04/21   Malvin Johns, MD  megestrol (MEGACE) 40 MG tablet Take 40 mg by mouth daily. Patient is taking 40 mg twice a day.    [provider]  metFORMIN (GLUCOPHAGE) 500 MG tablet Take 1 tablet (500 mg total) by mouth 2 (two) times daily with a meal. 10/05/21 10/05/22  [name], FNP  ondansetron (ZOFRAN) 4 MG tablet ondansetron    [provider]  pantoprazole (PROTONIX) 40 MG tablet Take 1 tablet (40 mg total) by mouth 2 (two) times daily for 14 days. 09/24/21 10/08/21  Thornton Park, MD  sucralfate (CARAFATE) 1 g tablet Take 2 tablets (2 g total) by mouth 4 (four) times daily -  with meals and at bedtime. 09/17/21   Willia Craze, NP  traMADol (ULTRAM) 50 MG tablet Take 1 tablet (50 mg total) by mouth every 6 (six) hours as needed. 12/04/21   Malvin Johns, MD      Allergies    Penicillins    Review of Systems   Review of Systems  Cardiovascular:  Positive for syncope.    Physical Exam Updated Vital Signs BP 126/66   Pulse 99   Temp 98.2 F (36.8 C)   Resp 15   Ht '5\' 8"'$  (1.727 m)   Wt 127.5 kg   SpO2 99%   BMI 42.73 kg/m   Physical Exam  ED Results / Procedures / Treatments   Labs (all labs ordered are listed, but only abnormal results are displayed) Labs Reviewed  URINALYSIS, ROUTINE W REFLEX MICROSCOPIC - Abnormal; Notable for the following components:      Result Value   APPearance CLOUDY (*)    Specific Gravity, Urine 1.032 (*)    Hgb urine dipstick MODERATE (*)    Bilirubin Urine SMALL (*)    Ketones, ur >80 (*)    Protein, ur 100 (*)    Leukocytes,Ua MODERATE (*)    Bacteria, UA MANY (*)    All other components within normal limits  CBC  PREGNANCY, URINE  BASIC METABOLIC PANEL  CBG MONITORING, ED  TROPONIN I (HIGH SENSITIVITY)    EKG None  Radiology No results found.  Procedures Procedures  {Document cardiac monitor, telemetry assessment procedure when appropriate:1}  Medications Ordered in ED Medications - No data to display  ED Course/ Medical Decision Making/ A&P                           Medical Decision Making Amount and/or Complexity of Data Reviewed Labs: ordered.  Risk Prescription drug management.  Rebecca Day is a 36 y.o. 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  Patient reports that she has had 2 syncopal episodes feeling dehydrated over the last few days.  She reports that she went to the emergency department at another facility several days ago and was given some fluids.  She had a reassuring CT of her abdomen and pelvis and reports her ankle x-ray did not show acute fracture.  She was told that she had a sprain of her right ankle after the syncopal fall and needs to stay hydrated.  Patient reports that she is still getting dehydrated and feeling lightheaded.  She reports had a syncopal episode today that was brief but did not have any injuries from it.  She currently denies any chest pain, shortness of breath, or  palpitations.  She reports her lightheadedness when she sits up or stands up or starts walking around.  She denies any current vomiting but has some mild nausea.  She reports she had some mild abdominal soreness related to advancing her diet but is denying significant abdominal pain at this time and does not feel she needs any CT imaging of her abdomen or pelvis.  She is denying any palpitations.  Denies any neck pain or neck stiffness.  She reports a mild headache which she attributes to her dehydration.  She reports she is here to get some fluids and a headache cocktail.  she denies any pelvic pain and denies any urinary symptoms.  On exam, lungs clear and chest nontender.  Abdomen nontender on my exam.  Surgical scars are well-appearing with no erythema, crepitance, or drainage.  Flanks and back nontender.  Normal bowel sounds.  Patient moving all extremities.  Mucous membranes are very dry.  Heart rate is just over 100 during my initial evaluation.  Clinically suspect she is dehydrated.  Patient thinks this is orthostatic syncope because it was happening after she sat up stood up and started walking and got lightheaded.  She was told that she needs to come to get fluids.  Patient had some labs in triage that did not show any critical abnormalities.  Patient did have significant ketones in her urine but otherwise with her lack of any urinary symptoms have low suspicion for UTI with no nitrites.  Patient requested 2 L of fluid and some Compazine and Benadryl to help with nausea and headache.  She agrees to hold on any CT imaging of her chest or abdomen and suspect her symptoms are simply due to dehydration.  Anticipate reassessment after her fluids and headache cocktail and if she is feeling better, dissipate discharge home and she agrees.  Patient will call her surgeon tomorrow for outpatient follow-up if she is able to go home.     {Document critical care time when appropriate:1} {Document review  of labs and clinical decision tools ie heart score, Chads2Vasc2 etc:1}  {Document your independent review of radiology images, and any outside records:1} {Document your discussion with family members, caretakers, and with consultants:1} {Document social determinants of health affecting pt's care:1} {Document your decision making why or why not admission, treatments were needed:1} Final Clinical Impression(s) / ED Diagnoses Final diagnoses:  None    Rx / DC Orders ED Discharge Orders     None

## 2022-02-16 NOTE — Discharge Instructions (Signed)
Your history, exam, work-up today are consistent with dehydration likely causing a syncopal episode.  Given your improvement in headache and symptoms after rehydration and medications we feel you are safe for discharge home to follow-up with your general surgery team.  If any symptoms change or worsen acutely, please return to the nearest emergency department.

## 2022-02-16 NOTE — ED Provider Notes (Incomplete)
[facility] Provider Note   CSN: [id number] Arrival date & time: 02/16/22  1959     History {Add pertinent medical, surgical, social history, OB history to HPI:1} Chief Complaint  Patient presents with  . Loss of Consciousness    Rebecca Day is a 36 y.o. female.   Loss of Consciousness      Home Medications Prior to Admission medications   Medication Sig Start Date End Date Taking? Authorizing Provider  famotidine (PEPCID) 20 MG tablet TAKE 1 TABLET BY MOUTH EVERYDAY AT BEDTIME 01/20/22   Willia Craze, NP  gabapentin (NEURONTIN) 300 MG capsule Take 1 capsule (300 mg total) by mouth See admin instructions. 1 cap in AM and 2 at night. 10/01/21   Truett Mainland, DO  lidocaine (LIDODERM) 5 % Place 1 patch onto the skin daily. Remove & Discard patch within 12 hours or as directed by MD 12/04/21   Malvin Johns, MD  megestrol (MEGACE) 40 MG tablet Take 40 mg by mouth daily. Patient is taking 40 mg twice a day.    [provider]  metFORMIN (GLUCOPHAGE) 500 MG tablet Take 1 tablet (500 mg total) by mouth 2 (two) times daily with a meal. 10/05/21 10/05/22  [name], FNP  ondansetron (ZOFRAN) 4 MG tablet ondansetron    [provider]  pantoprazole (PROTONIX) 40 MG tablet Take 1 tablet (40 mg total) by mouth 2 (two) times daily for 14 days. 09/24/21 10/08/21  Thornton Park, MD  sucralfate (CARAFATE) 1 g tablet Take 2 tablets (2 g total) by mouth 4 (four) times daily -  with meals and at bedtime. 09/17/21   Willia Craze, NP  traMADol (ULTRAM) 50 MG tablet Take 1 tablet (50 mg total) by mouth every 6 (six) hours as needed. 12/04/21   Malvin Johns, MD      Allergies    Penicillins    Review of Systems   Review of Systems  Cardiovascular:  Positive for syncope.    Physical Exam Updated Vital Signs BP 126/66   Pulse 99   Temp 98.2 F (36.8 C)   Resp 15   Ht '5\' 8"'$  (1.727 m)   Wt 127.5 kg   SpO2 99%   BMI 42.73 kg/m   Physical Exam  ED Results / Procedures / Treatments   Labs (all labs ordered are listed, but only abnormal results are displayed) Labs Reviewed  URINALYSIS, ROUTINE W REFLEX MICROSCOPIC - Abnormal; Notable for the following components:      Result Value   APPearance CLOUDY (*)    Specific Gravity, Urine 1.032 (*)    Hgb urine dipstick MODERATE (*)    Bilirubin Urine SMALL (*)    Ketones, ur >80 (*)    Protein, ur 100 (*)    Leukocytes,Ua MODERATE (*)    Bacteria, UA MANY (*)    All other components within normal limits  CBC  PREGNANCY, URINE  BASIC METABOLIC PANEL  CBG MONITORING, ED  TROPONIN I (HIGH SENSITIVITY)    EKG None  Radiology No results found.  Procedures Procedures  {Document cardiac monitor, telemetry assessment procedure when appropriate:1}  Medications Ordered in ED Medications - No data to display  ED Course/ Medical Decision Making/ A&P                           Medical Decision Making Amount and/or Complexity of Data Reviewed Labs: ordered.  Risk Prescription drug management.  Rebecca Day is a 36 y.o. 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  Patient reports that she has had 2 syncopal episodes feeling dehydrated over the last few days.  She reports that she went to the emergency department at another facility several days ago and was given some fluids.  She had a reassuring CT of her abdomen and pelvis and reports her ankle x-ray did not show acute fracture.  She was told that she had a sprain of her right ankle after the syncopal fall and needs to stay hydrated.  Patient reports that she is still getting dehydrated and feeling lightheaded.  She reports had a syncopal episode today that was brief but did not have any injuries from it.  She currently denies any chest pain, shortness of breath, or  palpitations.  She reports her lightheadedness when she sits up or stands up or starts walking around.  She denies any current vomiting but has some mild nausea.  She reports she had some mild abdominal soreness related to advancing her diet but is denying significant abdominal pain at this time and does not feel she needs any CT imaging of her abdomen or pelvis.  She is denying any palpitations.  Denies any neck pain or neck stiffness.  She reports a mild headache which she attributes to her dehydration.  She reports she is here to get some fluids and a headache cocktail.  she denies any pelvic pain and denies any urinary symptoms.  On exam, lungs clear and chest nontender.  Abdomen nontender on my exam.  Surgical scars are well-appearing with no erythema, crepitance, or drainage.  Flanks and back nontender.  Normal bowel sounds.  Patient moving all extremities.  Mucous membranes are very dry.  Heart rate is just over 100 during my initial evaluation.  Clinically suspect she is dehydrated.  Patient thinks this is orthostatic syncope because it was happening after she sat up stood up and started walking and got lightheaded.  She was told that she needs to come to get fluids.  Patient had some labs in triage that did not show any critical abnormalities.  Patient did have significant ketones in her urine but otherwise with her lack of any urinary symptoms have low suspicion for UTI with no nitrites.  Patient requested 2 L of fluid and some Compazine and Benadryl to help with nausea and headache.  She agrees to hold on any CT imaging of her chest or abdomen and suspect her symptoms are simply due to dehydration.  Anticipate reassessment after her fluids and headache cocktail and if she is feeling better, dissipate discharge home and she agrees.  Patient will call her surgeon tomorrow for outpatient follow-up if she is able to go home.  Patient reports headache is improved and she is feeling better.  Vital  signs reassuring.  Troponin negative x2.  Other labs also similar to prior reassuring, she adamantly denies urinary symptoms so will not treat for any UTI.  Culture was sent.  Patient thinks he is dehydrated and lightheaded with this and caused her to have orthostatic syncope.  She would like to go home now.  Patient be discharged to follow-up with her general surgery team and follow-up.  She no other questions or concerns and was discharged in good condition.  Patient was able to sit up without difficulty and is not feeling lightheaded.  She agrees with going home.  Patient discharged in good condition.   {Document critical care time when appropriate:1} {Document review of labs and clinical decision tools ie heart score, Chads2Vasc2 etc:1}  {Document your independent review of radiology images, and any outside records:1} {Document your discussion with family members, caretakers, and with consultants:1} {Document social determinants of health affecting pt's care:1} {Document your decision making why or why not admission, treatments were needed:1} Final Clinical Impression(s) / ED Diagnoses Final diagnoses:  None    Rx / DC Orders ED Discharge Orders     None

## 2022-02-17 NOTE — ED Notes (Addendum)
Pt verbalizes understanding of discharge instructions. Opportunity for questioning and answers were provided. Pt discharged from ED to home in a cab. Pt states that daughter is at home to help her get inside

## 2022-02-18 ENCOUNTER — Ambulatory Visit: Payer: No Typology Code available for payment source | Admitting: Nurse Practitioner

## 2022-03-11 ENCOUNTER — Encounter (HOSPITAL_BASED_OUTPATIENT_CLINIC_OR_DEPARTMENT_OTHER): Payer: Self-pay | Admitting: Emergency Medicine

## 2022-03-11 ENCOUNTER — Emergency Department (HOSPITAL_BASED_OUTPATIENT_CLINIC_OR_DEPARTMENT_OTHER)
Admission: EM | Admit: 2022-03-11 | Discharge: 2022-03-12 | Disposition: A | Discharge status: Home / Self Care | Payer: No Typology Code available for payment source | Attending: Emergency Medicine | Admitting: Emergency Medicine

## 2022-03-11 ENCOUNTER — Other Ambulatory Visit: Payer: Self-pay

## 2022-03-11 DIAGNOSIS — R112 Nausea with vomiting, unspecified: Secondary | ICD-10-CM

## 2022-03-11 DIAGNOSIS — R21 Rash and other nonspecific skin eruption: Secondary | ICD-10-CM | POA: Diagnosis present

## 2022-03-11 DIAGNOSIS — T7840XA Allergy, unspecified, initial encounter: Secondary | ICD-10-CM | POA: Diagnosis not present

## 2022-03-11 HISTORY — DX: Other specified health status: Z78.9

## 2022-03-11 MED ORDER — METHYLPREDNISOLONE SODIUM SUCC 125 MG IJ SOLR
125.0000 mg | Freq: Once | INTRAMUSCULAR | Status: AC
  Administered 2022-03-12: 125 mg via INTRAVENOUS
  Filled 2022-03-11: qty 2

## 2022-03-11 MED ORDER — FAMOTIDINE IN NACL 20-0.9 MG/50ML-% IV SOLN
20.0000 mg | Freq: Once | INTRAVENOUS | Status: AC
  Administered 2022-03-12: 20 mg via INTRAVENOUS
  Filled 2022-03-11: qty 50

## 2022-03-11 MED ORDER — DIPHENHYDRAMINE HCL 50 MG/ML IJ SOLN
25.0000 mg | Freq: Once | INTRAMUSCULAR | Status: AC
  Administered 2022-03-12: 25 mg via INTRAVENOUS
  Filled 2022-03-11: qty 1

## 2022-03-11 MED ORDER — ONDANSETRON HCL 4 MG/2ML IJ SOLN
4.0000 mg | Freq: Once | INTRAMUSCULAR | Status: AC
  Administered 2022-03-12: 4 mg via INTRAVENOUS
  Filled 2022-03-11: qty 2

## 2022-03-11 NOTE — ED Provider Notes (Signed)
[facility] EMERGENCY DEPT Provider Note   CSN: [id number] Arrival date & time: 03/11/22  2112     History  Chief Complaint  Patient presents with   Allergic Reaction    Rebecca Day is a 36 y.o. female.  The history is provided by the patient.  Allergic Reaction She has history of lupus and had recent bariatric surgery and comes in because of itchy rash.  This evening she noted a rash that started across her abdomen and has spread to involve her back, legs, arms.  She also has noted some itching in her mouth.  She denies any difficulty breathing or swallowing.  She had eaten crab legs as the only new exposure that she has had.  She did develop nausea and vomiting and was not able to take any medication at home because of that (she does have a prescription for hydroxyzine).   Home Medications Prior to Admission medications   Medication Sig Start Date End Date Taking? Authorizing Provider  famotidine (PEPCID) 20 MG tablet TAKE 1 TABLET BY MOUTH EVERYDAY AT BEDTIME 01/20/22   Willia Craze, NP  gabapentin (NEURONTIN) 300 MG capsule Take 1 capsule (300 mg total) by mouth See admin instructions. 1 cap in AM and 2 at night. 10/01/21   Truett Mainland, DO  lidocaine (LIDODERM) 5 % Place 1 patch onto the skin daily. Remove & Discard patch within 12 hours or as directed by MD 12/04/21   Malvin Johns, MD  megestrol (MEGACE) 40 MG tablet Take 40 mg by mouth daily. Patient is taking 40 mg twice a Day.    [provider]  metFORMIN (GLUCOPHAGE) 500 MG tablet Take 1 tablet (500 mg total) by mouth 2 (two) times daily with a meal. 10/05/21 10/05/22  [name], FNP  ondansetron (ZOFRAN) 4 MG tablet ondansetron    [provider]  pantoprazole (PROTONIX) 40 MG tablet Take 1 tablet (40 mg total) by mouth 2 (two) times daily for 14 days. 09/24/21 10/08/21  Thornton Park, MD  sucralfate (CARAFATE) 1 g tablet Take 2 tablets (2 g total) by mouth 4 (four) times daily -   with meals and at bedtime. 09/17/21   Willia Craze, NP  traMADol (ULTRAM) 50 MG tablet Take 1 tablet (50 mg total) by mouth every 6 (six) hours as needed. 12/04/21   Malvin Johns, MD      Allergies    Penicillins    Review of Systems   Review of Systems  All other systems reviewed and are negative.   Physical Exam Updated Vital Signs BP (!) 113/91 (BP Location: Left Arm)   Pulse 97   Temp 98.2 F (36.8 C)   Resp 19   Ht '5\' 8"'$  (1.727 m)   Wt 117.9 kg   SpO2 100%   BMI 39.53 kg/m  Physical Exam Vitals and nursing note reviewed.   36 year old female, resting comfortably and in no acute distress. Vital signs are significant for borderline elevated diastolic blood pressure. Oxygen saturation is 100%, which is normal. Head is normocephalic and atraumatic. PERRLA, EOMI. Oropharynx is clear, no intraoral lesions seen. Neck is nontender and supple without adenopathy or JVD. Back is nontender and there is no CVA tenderness. Lungs are clear without rales, wheezes, or rhonchi. Chest is nontender. Heart has regular rate and rhythm without murmur. Abdomen is soft, flat, nontender. Extremities have no cyanosis or edema, full range of motion is present.  PICC line present in the right upper arm.  Skin is warm and dry.  Faint papular rashes present on arms and legs and trunk, nonspecific in appearance. Neurologic: Mental status is normal, cranial nerves are intact, moves all extremities equally.  ED Results / Procedures / Treatments    Procedures Procedures    Medications Ordered in ED Medications  ondansetron (ZOFRAN) injection 4 mg (has no administration in time range)  diphenhydrAMINE (BENADRYL) injection 25 mg (has no administration in time range)  methylPREDNISolone sodium succinate (SOLU-MEDROL) 125 mg/2 mL injection 125 mg (has no administration in time range)  famotidine (PEPCID) IVPB 20 mg premix (has no administration in time range)    ED Course/ Medical Decision  Making/ A&P                           Medical Decision Making Risk Prescription drug management.   Pruritic rash, possible shellfish allergy.  No evidence of respiratory compromise.  Old records are reviewed, and I see no prior visits for rashes.  I have ordered intravenous diphenhydramine, famotidine, methylprednisolone, ondansetron.  She had good relief of symptoms with above-noted treatment.  I feel she is safe for discharge at this point.  I am giving her prescriptions for ondansetron oral dissolving tablet, and a 5-Day course of prednisone.  She is referred back to her primary care provider to discuss possible allergist referral.  I have advised her to use over-the-counter diphenhydramine as needed for itching.  Final Clinical Impression(s) / ED Diagnoses Final diagnoses:  Allergic reaction, initial encounter  Nausea and vomiting, unspecified vomiting type    Rx / DC Orders ED Discharge Orders          Ordered    ondansetron (ZOFRAN-ODT) 8 MG disintegrating tablet  Every 8 hours PRN        03/12/22 0355    predniSONE (DELTASONE) 50 MG tablet  Daily        03/12/22 9811              Delora Fuel, MD 91/47/82 [phone]

## 2022-03-11 NOTE — ED Provider Notes (Incomplete)
[facility] EMERGENC[facility] DEPT Provider Note   CSN: [id number] Arrival date & time: 03/11/22  2112     History {Add pertinent medical, surgical, social history, OB history to HPI:1} Chief Complaint  Patient presents with  . Allergic Reaction    Rebecca Day is a 36 y.o. female.  The history is provided by the patient.  Allergic Reaction She has history of lupus and had recent bariatric surgery and comes in because of itchy rash.  This evening she noted a rash that started across her abdomen and has spread to involve her back, legs, arms.  She also has noted some itching in her mouth.  She denies any difficulty breathing or swallowing.  She had eaten crab legs as the only new exposure that she has had.  She did develop nausea and vomiting and was not able to take any medication at home because of that.   Home Medications Prior to Admission medications   Medication Sig Start Date End Date Taking? Authorizing Provider  famotidine (PEPCID) 20 MG tablet TAKE 1 TABLET BY MOUTH EVERYDAY AT BEDTIME 01/20/22   Willia Craze, NP  gabapentin (NEURONTIN) 300 MG capsule Take 1 capsule (300 mg total) by mouth See admin instructions. 1 cap in AM and 2 at night. 10/01/21   Truett Mainland, DO  lidocaine (LIDODERM) 5 % Place 1 patch onto the skin daily. Remove & Discard patch within 12 hours or as directed by MD 12/04/21   Malvin Johns, MD  megestrol (MEGACE) 40 MG tablet Take 40 mg by mouth daily. Patient is taking 40 mg twice a day.    [provider]  metFORMIN (GLUCOPHAGE) 500 MG tablet Take 1 tablet (500 mg total) by mouth 2 (two) times daily with a meal. 10/05/21 10/05/22  [name], FNP  ondansetron (ZOFRAN) 4 MG tablet ondansetron    [provider]  pantoprazole (PROTONIX) 40 MG tablet Take 1 tablet (40 mg total) by mouth 2 (two) times daily for 14 days. 09/24/21 10/08/21  Thornton Park, MD  sucralfate (CARAFATE) 1 g tablet Take 2 tablets (2 g total) by mouth 4  (four) times daily -  with meals and at bedtime. 09/17/21   Willia Craze, NP  traMADol (ULTRAM) 50 MG tablet Take 1 tablet (50 mg total) by mouth every 6 (six) hours as needed. 12/04/21   Malvin Johns, MD      Allergies    Penicillins    Review of Systems   Review of Systems  All other systems reviewed and are negative.   Physical Exam Updated Vital Signs BP (!) 113/91 (BP Location: Left Arm)   Pulse 97   Temp 98.2 F (36.8 C)   Resp 19   Ht '5\' 8"'$  (1.727 m)   Wt 117.9 kg   SpO2 100%   BMI 39.53 kg/m  Physical Exam Vitals and nursing note reviewed.   36 year old female, resting comfortably and in no acute distress. Vital signs are ***. Oxygen saturation is ***%, which is normal. Head is normocephalic and atraumatic. PERRLA, EOMI. Oropharynx is clear. Neck is nontender and supple without adenopathy or JVD. Back is nontender and there is no CVA tenderness. Lungs are clear without rales, wheezes, or rhonchi. Chest is nontender. Heart has regular rate and rhythm without murmur. Abdomen is soft, flat, nontender without masses or hepatosplenomegaly and peristalsis is normoactive. Extremities have no cyanosis or edema, full range of motion is present. Skin is warm and dry without rash. Neurologic: Mental  status is normal, cranial nerves are intact, there are no motor or sensory deficits.  ED Results / Procedures / Treatments   Labs (all labs ordered are listed, but only abnormal results are displayed) Labs Reviewed - No data to display  EKG None  Radiology No results found.  Procedures Procedures  {Document cardiac monitor, telemetry assessment procedure when appropriate:1}  Medications Ordered in ED Medications  ondansetron (ZOFRAN) injection 4 mg (has no administration in time range)  diphenhydrAMINE (BENADRYL) injection 25 mg (has no administration in time range)  methylPREDNISolone sodium succinate (SOLU-MEDROL) 125 mg/2 mL injection 125 mg (has no  administration in time range)  famotidine (PEPCID) IVPB 20 mg premix (has no administration in time range)    ED Course/ Medical Decision Making/ A&P                           Medical Decision Making Risk Prescription drug management.   ***  {Document critical care time when appropriate:1} {Document review of labs and clinical decision tools ie heart score, Chads2Vasc2 etc:1}  {Document your independent review of radiology images, and any outside records:1} {Document your discussion with family members, caretakers, and with consultants:1} {Document social determinants of health affecting pt's care:1} {Document your decision making why or why not admission, treatments were needed:1} Final Clinical Impression(s) / ED Diagnoses Final diagnoses:  None    Rx / DC Orders ED Discharge Orders     None

## 2022-03-11 NOTE — ED Triage Notes (Signed)
STARTED AROUND 8PM Itchy, bumpy rash started on abdomen then legs and back (spreading) , feel some itchy ness in mouth. Ate crab legs for the second time today around 7pm. (First time was yesterday)  Did not take any OTC meds. No SOB or swelling in mouth.

## 2022-03-12 MED ORDER — PREDNISONE 50 MG PO TABS: 50.0000 mg | ORAL_TABLET | Freq: Every day | ORAL | 0 refills | Status: DC

## 2022-03-12 MED ORDER — ONDANSETRON 8 MG PO TBDP: 8.0000 mg | ORAL_TABLET | Freq: Three times a day (TID) | ORAL | 0 refills | Status: DC | PRN

## 2022-03-12 NOTE — ED Notes (Signed)
Per MD use pt PICC line vs inserting INT

## 2022-03-12 NOTE — Discharge Instructions (Addendum)
You may take diphenhydramine (Benadryl) as needed for itching.  Talk with your doctor about possible referral to an allergist to see which you may have reacted to.  Return if you develop any difficulty breathing or swallowing, or if you have any new or concerning symptoms.

## 2022-03-20 ENCOUNTER — Encounter (HOSPITAL_BASED_OUTPATIENT_CLINIC_OR_DEPARTMENT_OTHER): Payer: Self-pay | Admitting: Emergency Medicine

## 2022-03-20 ENCOUNTER — Other Ambulatory Visit: Payer: Self-pay

## 2022-03-20 ENCOUNTER — Emergency Department (HOSPITAL_BASED_OUTPATIENT_CLINIC_OR_DEPARTMENT_OTHER)
Admission: EM | Admit: 2022-03-20 | Discharge: 2022-03-20 | Disposition: A | Discharge status: Home / Self Care | Payer: No Typology Code available for payment source | Attending: Emergency Medicine | Admitting: Emergency Medicine

## 2022-03-20 DIAGNOSIS — M79601 Pain in right arm: Secondary | ICD-10-CM | POA: Insufficient documentation

## 2022-03-20 DIAGNOSIS — T82598A Other mechanical complication of other cardiac and vascular devices and implants, initial encounter: Secondary | ICD-10-CM | POA: Diagnosis not present

## 2022-03-20 DIAGNOSIS — Z7984 Long term (current) use of oral hypoglycemic drugs: Secondary | ICD-10-CM | POA: Insufficient documentation

## 2022-03-20 LAB — COMPREHENSIVE METABOLIC PANEL
ALT: 57 U/L — ABNORMAL HIGH (ref 0–44)
AST: 38 U/L (ref 15–41)
Albumin: 4.1 g/dL (ref 3.5–5.0)
Alkaline Phosphatase: 98 U/L (ref 38–126)
Anion gap: 9 (ref 5–15)
BUN: 11 mg/dL (ref 6–20)
CO2: 23 mmol/L (ref 22–32)
Calcium: 9.5 mg/dL (ref 8.9–10.3)
Chloride: 104 mmol/L (ref 98–111)
Creatinine, Ser: 0.73 mg/dL (ref 0.44–1.00)
GFR, Estimated: >60 mL/min (ref >60)
Glucose, Bld: 75 mg/dL (ref 70–99)
Potassium: 3.8 mmol/L (ref 3.5–5.1)
Sodium: 136 mmol/L (ref 135–145)
Total Bilirubin: 0.7 mg/dL (ref 0.3–1.2)
Total Protein: 7.2 g/dL (ref 6.5–8.1)

## 2022-03-20 LAB — CBC
HCT: 40 % (ref 36.0–46.0)
Hemoglobin: 13.2 g/dL (ref 12.0–15.0)
MCH: 29.8 pg (ref 26.0–34.0)
MCHC: 33 g/dL (ref 30.0–36.0)
MCV: 90.3 fL (ref 80.0–100.0)
Platelets: 242 10*3/uL (ref 150–400)
RBC: 4.43 MIL/uL (ref 3.87–5.11)
RDW: 14.3 % (ref 11.5–15.5)
WBC: 8.4 10*3/uL (ref 4.0–10.5)
nRBC: 0 % (ref 0.0–0.2)

## 2022-03-20 NOTE — ED Notes (Signed)
Right upper arm PICC assessed.  Small crusting noted around insertion site with no redness or drainage.  Palpated for no cyst or swelling around insertion site.  PICC redressing with bio patch

## 2022-03-20 NOTE — ED Triage Notes (Signed)
Pt here from home wanting her PICC line checked to make its not infected , pt is receiving TPN through it , no fevers

## 2022-03-20 NOTE — ED Provider Notes (Signed)
[facility] EMERGENC[facility] DEPT Provider Note   CSN: [id number] Arrival date & time: 03/20/22  1706     History  Chief Complaint  Patient presents with   Vascular Access Problem    Rebecca Day is a 36 y.o. female.  Patient concerned that PICC line in right arm could be infected.  Patient reports when home health nurse cleaned the area today she thought she saw some drainage around site.  Patient denies any other complaints she has not had any fever she has not had any chills she denies any nausea or vomiting.  Patient has some soreness at the site.  She denies seeing any redness.  Patient is receiving TPN through PICC line.  She reports the line has been functioning normally.  The history is provided by the patient. No language interpreter was used.       Home Medications Prior to Admission medications   Medication Sig Start Date End Date Taking? Authorizing Provider  famotidine (PEPCID) 20 MG tablet TAKE 1 TABLET BY MOUTH EVERYDAY AT BEDTIME 01/20/22   Willia Craze, NP  gabapentin (NEURONTIN) 300 MG capsule Take 1 capsule (300 mg total) by mouth See admin instructions. 1 cap in AM and 2 at night. 10/01/21   Truett Mainland, DO  lidocaine (LIDODERM) 5 % Place 1 patch onto the skin daily. Remove & Discard patch within 12 hours or as directed by MD 12/04/21   Malvin Johns, MD  megestrol (MEGACE) 40 MG tablet Take 40 mg by mouth daily. Patient is taking 40 mg twice a day.    [provider]  metFORMIN (GLUCOPHAGE) 500 MG tablet Take 1 tablet (500 mg total) by mouth 2 (two) times daily with a meal. 10/05/21 10/05/22  [name], FNP  ondansetron (ZOFRAN-ODT) 8 MG disintegrating tablet Take 1 tablet (8 mg total) by mouth every 8 (eight) hours as needed for nausea or vomiting. 02/17/29   Delora Fuel, MD  pantoprazole (PROTONIX) 40 MG tablet Take 1 tablet (40 mg total) by mouth 2 (two) times daily for 14 days. 09/24/21 10/08/21  Thornton Park, MD  predniSONE  (DELTASONE) 50 MG tablet Take 1 tablet (50 mg total) by mouth daily. 8/65/78   Delora Fuel, MD  sucralfate (CARAFATE) 1 g tablet Take 2 tablets (2 g total) by mouth 4 (four) times daily -  with meals and at bedtime. 09/17/21   Willia Craze, NP  traMADol (ULTRAM) 50 MG tablet Take 1 tablet (50 mg total) by mouth every 6 (six) hours as needed. 12/04/21   Malvin Johns, MD      Allergies    Penicillins    Review of Systems   Review of Systems  Constitutional:  Negative for chills and fever.  Gastrointestinal:  Negative for nausea.  All other systems reviewed and are negative.   Physical Exam Updated Vital Signs BP 115/86 (BP Location: Left Arm)   Pulse (!) 101   Temp 98.1 F (36.7 C)   Resp 16   Ht '5\' 8"'$  (1.727 m)   SpO2 100%   BMI 39.53 kg/m  Physical Exam Vitals and nursing note reviewed.  Constitutional:      Appearance: She is well-developed.  HENT:     Head: Normocephalic.  Cardiovascular:     Rate and Rhythm: Normal rate.  Pulmonary:     Effort: Pulmonary effort is normal.  Abdominal:     General: There is no distension.  Musculoskeletal:        General: Normal range  of motion.  Skin:    General: Skin is warm.  Neurological:     Mental Status: She is alert and oriented to person, place, and time.     ED Results / Procedures / Treatments   Labs (all labs ordered are listed, but only abnormal results are displayed) Labs Reviewed  COMPREHENSIVE METABOLIC PANEL - Abnormal; Notable for the following components:      Result Value   ALT 57 (*)    All other components within normal limits  CBC    EKG None  Radiology No results found.  Procedures Procedures    Medications Ordered in ED Medications - No data to display  ED Course/ Medical Decision Making/ A&P                           Medical Decision Making Amount and/or Complexity of Data Reviewed External Data Reviewed: notes.    Details: Patient had a gastric bypass and is currently  receiving TPN through PICC line Labs: ordered. Decision-making details documented in ED Course.    Details: Labs ordered reviewed and interpreted.  Patient has a normal white blood cell count  Risk Risk Details: RN Colin Ina evaluated IV site cleaned and rebandaged area.  He reports no sign of infection.  He reports line flushed normally.  Patient has normal laboratory evaluations.  Patient is given precautions on signs of infection that would indicate the need to return for reevaluation. RN is IV team nurse from [facility].            Final Clinical Impression(s) / ED Diagnoses Final diagnoses:  Arm pain, right    Rx / DC Orders ED Discharge Orders     None     An After Visit Summary was printed and given to the patient.     Fransico Meadow, [place] 03/20/22 1954    Malvin Johns, MD 03/20/22 [phone]

## 2022-03-22 ENCOUNTER — Ambulatory Visit: Payer: No Typology Code available for payment source | Admitting: Nurse Practitioner

## 2022-03-24 ENCOUNTER — Telehealth: Payer: Self-pay

## 2022-03-24 NOTE — Telephone Encounter (Signed)
Transition Care Management Unsuccessful Follow-up Telephone Call  Date of discharge and from where:  03/20/2022  Attempts:  1st Attempt  Reason for unsuccessful TCM follow-up call:  Left voice message

## 2022-04-02 ENCOUNTER — Encounter: Payer: Self-pay | Admitting: Nurse Practitioner

## 2022-04-02 ENCOUNTER — Ambulatory Visit (INDEPENDENT_AMBULATORY_CARE_PROVIDER_SITE_OTHER): Payer: No Typology Code available for payment source | Admitting: Nurse Practitioner

## 2022-04-02 VITALS — BP 110/68 | HR 111 | Temp 98.9°F | Ht 68.0 in | Wt 274.4 lb

## 2022-04-02 DIAGNOSIS — Z789 Other specified health status: Secondary | ICD-10-CM

## 2022-04-02 DIAGNOSIS — M329 Systemic lupus erythematosus, unspecified: Secondary | ICD-10-CM

## 2022-04-02 DIAGNOSIS — Z9884 Bariatric surgery status: Secondary | ICD-10-CM

## 2022-04-02 DIAGNOSIS — E8881 Metabolic syndrome: Secondary | ICD-10-CM | POA: Diagnosis not present

## 2022-04-02 DIAGNOSIS — E782 Mixed hyperlipidemia: Secondary | ICD-10-CM

## 2022-04-02 NOTE — Progress Notes (Signed)
Rebecca Day,acting as a Education administrator for [name], FNP.,have documented all relevant documentation on the behalf of [name], FNP,as directed by  [name], FNP while in the presence of [name], Rebecca Day.    Subjective:     Patient ID: Rebecca Day , female    DOB: Apr 04, 1986 , 36 y.o.   MRN: [id number]   Chief Complaint  Patient presents with   Follow-up    ED    HPI  Patient presents today for a ED follow up and gastric bypass surgery. Patient states she is complaint with all medications. Patient went to the hospital due to having discharge from her TPN, patient was admitted 03/20/22 on and discharged on 03/20/22. She had gastric bypass on June first, patient states she was feeling good the first 2 weeks then she randomly started passing out, patient has a "TPN" that she states makes her feel better. Patient states her nurse stated that she may be allergic to her patch or the dressing on her TPN. Patient states her energy level is not where she wants it but overall she's feeling better. She was also seen in the ER for a possible allergic reaction had been eating for one week crab meat. She had been itching and welping areas. She will take a Benadryl when this occurs. Not sure of what the trigger is.   She is due to see Dr. Heron Day [facility] next Monday.    BP Readings from Last 3 Encounters: 04/02/22 : 110/68 03/20/22 : 132/76 03/12/22 : 108/63   Wt Readings from Last 3 Encounters: 04/02/22 : 274 lb 6.4 oz (124.5 kg) 03/11/22 : 260 lb (117.9 kg) 02/16/22 : 281 lb (127.5 kg)  Weight initially was 315 lbs prior to her surgery. She did go through a period where she could not hold anything down in June/July was started on a PICC line with TPN. She is eating every couple of hours, not doing well with chicken, has done okay with tuna, apple sauce and protein drinks. She has been back at work 6/28. She is trying to get in more fluid intake.  She works from home. Some  foods are making her nauseated.  She does feel like she has pushed too much. She had been on blood thinners for 3 weeks post op due to previous history of blood clots after child birth. She is having a CMP and CBC every 2 weeks.   She is also seeing a therapist Rebecca Day) virtual visits availability weekly.      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[facility])    On total parenteral nutrition (TPN)    Ovarian cyst    PMDD (premenstrual dysphoric disorder)      Family History  Problem Relation Age of Onset   Hypertension Mother    Colon polyps Mother    Cancer Father        dx'd in 39's   Colon cancer Father    Colon cancer Maternal Aunt    Hyperlipidemia Maternal Grandmother    Kidney disease Brother    Liver disease Brother  Cancer Brother    Colon cancer Other    Esophageal cancer Neg Hx    Rectal cancer Neg Hx    Stomach cancer Neg Hx    Breast cancer Neg Hx      Current Outpatient Medications:    sterile water SOLN with amino acids 10 % SOLN 1.3 g/kg, dextrose 70 % SOLN 20 %, Inject into the vein every 12 (twelve) hours. 12 hours on - 12 hours off., Disp: , Rfl:   Current Facility-Administered Medications:    dextrose 5 % solution, , Intravenous, Once, Thornton Park, MD   Allergies  Allergen Reactions   Penicillins Hives    Has patient had a PCN reaction causing immediate rash, facial/tongue/throat swelling, SOB or lightheadedness with hypotension: Yes Has patient had a PCN reaction causing severe rash involving mucus membranes or skin necrosis: No Has patient had a PCN reaction that required hospitalization: Yes Has patient had a PCN reaction occurring within the last 10 years:  NO If all of the above answers are "NO", then may proceed with Cephalosporin use.     Review of Systems  Constitutional: Negative.   HENT: Negative.    Eyes: Negative.   Respiratory: Negative.    Cardiovascular: Negative.   Gastrointestinal: Negative.   Psychiatric/Behavioral: Negative.       Today's Vitals   04/02/22 0830  BP: 110/68  Pulse: (!) 111  Temp: 98.9 F (37.2 C)  TempSrc: Oral  Weight: 274 lb 6.4 oz (124.5 kg)  Height: '5\' 8"'$  (1.727 m)  PainSc: 0-No pain   Body mass index is 41.72 kg/m.  Wt Readings from Last 3 Encounters:  04/15/22 270 lb 8.1 oz (122.7 kg)  04/02/22 274 lb 6.4 oz (124.5 kg)  03/11/22 260 lb (117.9 kg)     Objective:  Physical Exam Vitals reviewed.  Constitutional:      General: She is not in acute distress.    Appearance: Normal appearance. She is obese.  Cardiovascular:     Rate and Rhythm: Normal rate and regular rhythm.     Pulses: Normal pulses.     Heart sounds: Normal heart sounds. No murmur heard. Skin:    General: Skin is warm and dry.     Capillary Refill: Capillary refill takes less than 2 seconds.  Neurological:     General: No focal deficit present.     Mental Status: She is alert and oriented to person, place, and time.     Cranial Nerves: No cranial nerve deficit.     Motor: No weakness.  Psychiatric:        Mood and Affect: Mood normal.        Behavior: Behavior normal.        Thought Content: Thought content normal.        Judgment: Judgment normal.         Assessment And Plan:     1. Mixed hyperlipidemia Comments: Stable, continue low fat diet.  - Lipid panel  2. Insulin resistance Comments: She is doing better since having her gastric bypass however is on TPN - Hemoglobin A1c  3. Lupus ([place]) Comments: Continue follow up with Rheumatology  4. S/P gastric bypass Comments: Continue f/u with Bariatric clinic  5. On total parenteral nutrition (TPN)     Patient was given opportunity to ask  questions. Patient verbalized understanding of the plan and was able to repeat key elements of the plan. All questions were answered to their satisfaction.  [name], FNP  Teola Bradley, FNP, have reviewed all documentation for this visit. The documentation on 04/02/22 for the exam, diagnosis, procedures, and orders are all accurate and complete.   IF YOU HAVE BEEN REFERRED TO A SPECIALIST, IT MAY TAKE 1-2 WEEKS TO SCHEDULE/PROCESS THE REFERRAL. IF YOU HAVE NOT HEARD FROM US/SPECIALIST IN TWO WEEKS, PLEASE GIVE [place] A CALL AT [phone] X [phone].   THE PATIENT IS ENCOURAGED TO PRACTICE SOCIAL DISTANCING DUE TO THE COVID-19 PANDEMIC.

## 2022-04-02 NOTE — Patient Instructions (Signed)

## 2022-04-04 ENCOUNTER — Other Ambulatory Visit: Payer: Self-pay

## 2022-04-04 ENCOUNTER — Encounter (HOSPITAL_COMMUNITY): Payer: Self-pay | Admitting: *Deleted

## 2022-04-04 ENCOUNTER — Encounter: Payer: Self-pay | Admitting: Nurse Practitioner

## 2022-04-04 ENCOUNTER — Emergency Department (HOSPITAL_COMMUNITY): Payer: No Typology Code available for payment source

## 2022-04-04 ENCOUNTER — Inpatient Hospital Stay (HOSPITAL_COMMUNITY)
Admission: EM | Admit: 2022-04-04 | Discharge: 2022-04-15 | DRG: 314 | Disposition: A | Discharge status: Home Health | Payer: No Typology Code available for payment source | Attending: Internal Medicine | Admitting: Internal Medicine

## 2022-04-04 DIAGNOSIS — L299 Pruritus, unspecified: Secondary | ICD-10-CM | POA: Diagnosis not present

## 2022-04-04 DIAGNOSIS — Z6841 Body Mass Index (BMI) 40.0 and over, adult: Secondary | ICD-10-CM | POA: Diagnosis not present

## 2022-04-04 DIAGNOSIS — M329 Systemic lupus erythematosus, unspecified: Secondary | ICD-10-CM | POA: Diagnosis present

## 2022-04-04 DIAGNOSIS — Z9884 Bariatric surgery status: Secondary | ICD-10-CM

## 2022-04-04 DIAGNOSIS — R579 Shock, unspecified: Secondary | ICD-10-CM | POA: Diagnosis present

## 2022-04-04 DIAGNOSIS — Z20822 Contact with and (suspected) exposure to covid-19: Secondary | ICD-10-CM | POA: Diagnosis present

## 2022-04-04 DIAGNOSIS — R6521 Severe sepsis with septic shock: Secondary | ICD-10-CM | POA: Diagnosis not present

## 2022-04-04 DIAGNOSIS — J45909 Unspecified asthma, uncomplicated: Secondary | ICD-10-CM | POA: Diagnosis present

## 2022-04-04 DIAGNOSIS — L270 Generalized skin eruption due to drugs and medicaments taken internally: Secondary | ICD-10-CM | POA: Diagnosis not present

## 2022-04-04 DIAGNOSIS — E872 Acidosis, unspecified: Secondary | ICD-10-CM | POA: Diagnosis present

## 2022-04-04 DIAGNOSIS — A4189 Other specified sepsis: Secondary | ICD-10-CM | POA: Diagnosis present

## 2022-04-04 DIAGNOSIS — Y848 Other medical procedures as the cause of abnormal reaction of the patient, or of later complication, without mention of misadventure at the time of the procedure: Secondary | ICD-10-CM | POA: Diagnosis present

## 2022-04-04 DIAGNOSIS — R7881 Bacteremia: Secondary | ICD-10-CM | POA: Diagnosis not present

## 2022-04-04 DIAGNOSIS — T80219A Unspecified infection due to central venous catheter, initial encounter: Secondary | ICD-10-CM | POA: Diagnosis present

## 2022-04-04 DIAGNOSIS — E669 Obesity, unspecified: Secondary | ICD-10-CM | POA: Diagnosis present

## 2022-04-04 DIAGNOSIS — B9789 Other viral agents as the cause of diseases classified elsewhere: Secondary | ICD-10-CM | POA: Diagnosis present

## 2022-04-04 DIAGNOSIS — R519 Headache, unspecified: Secondary | ICD-10-CM | POA: Diagnosis not present

## 2022-04-04 DIAGNOSIS — R7401 Elevation of levels of liver transaminase levels: Secondary | ICD-10-CM | POA: Diagnosis present

## 2022-04-04 DIAGNOSIS — R739 Hyperglycemia, unspecified: Secondary | ICD-10-CM | POA: Diagnosis present

## 2022-04-04 DIAGNOSIS — R609 Edema, unspecified: Secondary | ICD-10-CM | POA: Diagnosis not present

## 2022-04-04 DIAGNOSIS — A419 Sepsis, unspecified organism: Secondary | ICD-10-CM

## 2022-04-04 DIAGNOSIS — Z880 Allergy status to penicillin: Secondary | ICD-10-CM | POA: Diagnosis not present

## 2022-04-04 DIAGNOSIS — B349 Viral infection, unspecified: Secondary | ICD-10-CM

## 2022-04-04 DIAGNOSIS — R509 Fever, unspecified: Secondary | ICD-10-CM | POA: Diagnosis not present

## 2022-04-04 DIAGNOSIS — T368X5A Adverse effect of other systemic antibiotics, initial encounter: Secondary | ICD-10-CM | POA: Diagnosis not present

## 2022-04-04 DIAGNOSIS — B957 Other staphylococcus as the cause of diseases classified elsewhere: Secondary | ICD-10-CM | POA: Diagnosis present

## 2022-04-04 DIAGNOSIS — Z79899 Other long term (current) drug therapy: Secondary | ICD-10-CM | POA: Diagnosis not present

## 2022-04-04 DIAGNOSIS — Z7984 Long term (current) use of oral hypoglycemic drugs: Secondary | ICD-10-CM | POA: Diagnosis not present

## 2022-04-04 DIAGNOSIS — R748 Abnormal levels of other serum enzymes: Secondary | ICD-10-CM

## 2022-04-04 HISTORY — DX: Sepsis, unspecified organism: A41.9

## 2022-04-04 LAB — URINALYSIS, ROUTINE W REFLEX MICROSCOPIC
Bacteria, UA: NONE SEEN
Bilirubin Urine: NEGATIVE
Glucose, UA: NEGATIVE mg/dL
Ketones, ur: NEGATIVE mg/dL
Leukocytes,Ua: NEGATIVE
Nitrite: NEGATIVE
Protein, ur: NEGATIVE mg/dL
Specific Gravity, Urine: 1.012 (ref 1.005–1.030)
pH: 5 (ref 5.0–8.0)

## 2022-04-04 LAB — LACTIC ACID, PLASMA
Lactic Acid, Venous: 1.7 mmol/L (ref 0.5–1.9)
Lactic Acid, Venous: 0.9 mmol/L (ref 0.5–1.9)

## 2022-04-04 LAB — COMPREHENSIVE METABOLIC PANEL
ALT: 54 U/L — ABNORMAL HIGH (ref 0–44)
AST: 37 U/L (ref 15–41)
Albumin: 3.2 g/dL — ABNORMAL LOW (ref 3.5–5.0)
Alkaline Phosphatase: 92 U/L (ref 38–126)
Anion gap: 9 (ref 5–15)
BUN: 10 mg/dL (ref 6–20)
CO2: 21 mmol/L — ABNORMAL LOW (ref 22–32)
Calcium: 9.1 mg/dL (ref 8.9–10.3)
Chloride: 105 mmol/L (ref 98–111)
Creatinine, Ser: 0.94 mg/dL (ref 0.44–1.00)
GFR, Estimated: >60 mL/min (ref >60)
Glucose, Bld: 131 mg/dL — ABNORMAL HIGH (ref 70–99)
Potassium: 4 mmol/L (ref 3.5–5.1)
Sodium: 135 mmol/L (ref 135–145)
Total Bilirubin: 0.9 mg/dL (ref 0.3–1.2)
Total Protein: 6.9 g/dL (ref 6.5–8.1)

## 2022-04-04 LAB — PROCALCITONIN: Procalcitonin: 0.66 ng/mL

## 2022-04-04 LAB — I-STAT BETA HCG BLOOD, ED (MC, WL, AP ONLY): I-stat hCG, quantitative: <5 m[IU]/mL (ref <5)

## 2022-04-04 LAB — CBC WITH DIFFERENTIAL/PLATELET
Abs Immature Granulocytes: 0.02 10*3/uL (ref 0.00–0.07)
Basophils Absolute: 0 10*3/uL (ref 0.0–0.1)
Basophils Relative: 0 %
Eosinophils Absolute: 0 10*3/uL (ref 0.0–0.5)
Eosinophils Relative: 0 %
HCT: 39.3 % (ref 36.0–46.0)
Hemoglobin: 13.3 g/dL (ref 12.0–15.0)
Immature Granulocytes: 0 %
Lymphocytes Relative: 13 %
Lymphs Abs: 0.8 10*3/uL (ref 0.7–4.0)
MCH: 29.8 pg (ref 26.0–34.0)
MCHC: 33.8 g/dL (ref 30.0–36.0)
MCV: 88.1 fL (ref 80.0–100.0)
Monocytes Absolute: 0.4 10*3/uL (ref 0.1–1.0)
Monocytes Relative: 6 %
Neutro Abs: 5.2 10*3/uL (ref 1.7–7.7)
Neutrophils Relative %: 81 %
Platelets: 223 10*3/uL (ref 150–400)
RBC: 4.46 MIL/uL (ref 3.87–5.11)
RDW: 13.2 % (ref 11.5–15.5)
WBC: 6.5 10*3/uL (ref 4.0–10.5)
nRBC: 0 % (ref 0.0–0.2)

## 2022-04-04 LAB — RESP PANEL BY RT-PCR (FLU A&B, COVID) ARPGX2
Influenza A by PCR: NEGATIVE
Influenza B by PCR: NEGATIVE
SARS Coronavirus 2 by RT PCR: NEGATIVE

## 2022-04-04 LAB — I-STAT CHEM 8, ED
BUN: 10 mg/dL (ref 6–20)
Calcium, Ion: 1.19 mmol/L (ref 1.15–1.40)
Chloride: 103 mmol/L (ref 98–111)
Creatinine, Ser: 0.8 mg/dL (ref 0.44–1.00)
Glucose, Bld: 124 mg/dL — ABNORMAL HIGH (ref 70–99)
HCT: 41 % (ref 36.0–46.0)
Hemoglobin: 13.9 g/dL (ref 12.0–15.0)
Potassium: 3.9 mmol/L (ref 3.5–5.1)
Sodium: 136 mmol/L (ref 135–145)
TCO2: 20 mmol/L — ABNORMAL LOW (ref 22–32)

## 2022-04-04 LAB — GLUCOSE, CAPILLARY: Glucose-Capillary: 111 mg/dL — ABNORMAL HIGH (ref 70–99)

## 2022-04-04 LAB — MRSA NEXT GEN BY PCR, NASAL: MRSA by PCR Next Gen: NOT DETECTED

## 2022-04-04 MED ORDER — IBUPROFEN 800 MG PO TABS
800.0000 mg | ORAL_TABLET | Freq: Once | ORAL | Status: AC
  Administered 2022-04-04: 800 mg via ORAL
  Filled 2022-04-04: qty 1

## 2022-04-04 MED ORDER — LACTATED RINGERS IV BOLUS
1000.0000 mL | Freq: Once | INTRAVENOUS | Status: AC
  Administered 2022-04-04: 1000 mL via INTRAVENOUS

## 2022-04-04 MED ORDER — CHLORHEXIDINE GLUCONATE CLOTH 2 % EX PADS
6.0000 | MEDICATED_PAD | Freq: Every day | CUTANEOUS | Status: DC
  Administered 2022-04-04 – 2022-04-13 (×10): 6 via TOPICAL

## 2022-04-04 MED ORDER — IOHEXOL 350 MG/ML SOLN
100.0000 mL | Freq: Once | INTRAVENOUS | Status: AC | PRN
Start: 2022-04-04 — End: 2022-04-04
  Administered 2022-04-04: 100 mL via INTRAVENOUS

## 2022-04-04 MED ORDER — NOREPINEPHRINE 4 MG/250ML-% IV SOLN
INTRAVENOUS | Status: AC
  Administered 2022-04-04: 10 ug/min via INTRAVENOUS
  Filled 2022-04-04: qty 250

## 2022-04-04 MED ORDER — DOCUSATE SODIUM 100 MG PO CAPS: 100.0000 mg | ORAL_CAPSULE | Freq: Two times a day (BID) | ORAL | Status: DC | PRN

## 2022-04-04 MED ORDER — VANCOMYCIN HCL IN DEXTROSE 1-5 GM/200ML-% IV SOLN: 1000.0000 mg | Freq: Once | INTRAVENOUS | Status: DC

## 2022-04-04 MED ORDER — SODIUM CHLORIDE 0.9 % IV SOLN
2.0000 g | Freq: Once | INTRAVENOUS | Status: AC
  Administered 2022-04-04: 2 g via INTRAVENOUS
  Filled 2022-04-04: qty 12.5

## 2022-04-04 MED ORDER — SODIUM CHLORIDE 0.9 % IV SOLN
2.0000 g | Freq: Three times a day (TID) | INTRAVENOUS | Status: DC
  Administered 2022-04-04 – 2022-04-05 (×2): 2 g via INTRAVENOUS
  Filled 2022-04-04 (×2): qty 12.5

## 2022-04-04 MED ORDER — VANCOMYCIN HCL 1250 MG/250ML IV SOLN
1250.0000 mg | Freq: Two times a day (BID) | INTRAVENOUS | Status: DC
  Administered 2022-04-05: 1250 mg via INTRAVENOUS
  Filled 2022-04-04: qty 250

## 2022-04-04 MED ORDER — VANCOMYCIN HCL 2000 MG/400ML IV SOLN
2000.0000 mg | Freq: Once | INTRAVENOUS | Status: AC
  Administered 2022-04-04: 2000 mg via INTRAVENOUS
  Filled 2022-04-04: qty 400

## 2022-04-04 MED ORDER — ENOXAPARIN SODIUM 40 MG/0.4ML IJ SOSY
40.0000 mg | PREFILLED_SYRINGE | INTRAMUSCULAR | Status: DC
  Administered 2022-04-04: 40 mg via SUBCUTANEOUS
  Filled 2022-04-04: qty 0.4

## 2022-04-04 MED ORDER — ACETAMINOPHEN 500 MG PO TABS
500.0000 mg | ORAL_TABLET | Freq: Once | ORAL | Status: AC
  Administered 2022-04-04: 500 mg via ORAL
  Filled 2022-04-04: qty 1

## 2022-04-04 MED ORDER — SODIUM CHLORIDE 0.9% FLUSH
10.0000 mL | Freq: Two times a day (BID) | INTRAVENOUS | Status: DC
  Administered 2022-04-05 – 2022-04-14 (×13): 10 mL

## 2022-04-04 MED ORDER — SODIUM CHLORIDE 0.9% FLUSH: 10.0000 mL | INTRAVENOUS | Status: DC | PRN

## 2022-04-04 MED ORDER — SODIUM CHLORIDE 0.9 % IV BOLUS
1000.0000 mL | Freq: Once | INTRAVENOUS | Status: AC
  Administered 2022-04-04: 1000 mL via INTRAVENOUS

## 2022-04-04 MED ORDER — METRONIDAZOLE 500 MG/100ML IV SOLN
500.0000 mg | Freq: Once | INTRAVENOUS | Status: AC
Start: 2022-04-04 — End: 2022-04-04
  Administered 2022-04-04: 500 mg via INTRAVENOUS
  Filled 2022-04-04: qty 100

## 2022-04-04 MED ORDER — POLYETHYLENE GLYCOL 3350 17 G PO PACK: 17.0000 g | PACK | Freq: Every day | ORAL | Status: DC | PRN

## 2022-04-04 MED ORDER — ORAL CARE MOUTH RINSE: 15.0000 mL | OROMUCOSAL | Status: DC | PRN

## 2022-04-04 MED ORDER — CHLORHEXIDINE GLUCONATE CLOTH 2 % EX PADS: 6.0000 | MEDICATED_PAD | Freq: Every day | CUTANEOUS | Status: DC

## 2022-04-04 MED ORDER — LACTATED RINGERS IV SOLN: INTRAVENOUS | Status: AC

## 2022-04-04 MED ORDER — SODIUM CHLORIDE 0.9 % IV BOLUS
2000.0000 mL | Freq: Once | INTRAVENOUS | Status: AC
  Administered 2022-04-04: 2000 mL via INTRAVENOUS

## 2022-04-04 MED ORDER — NOREPINEPHRINE 4 MG/250ML-% IV SOLN
0.0000 ug/min | INTRAVENOUS | Status: DC
  Administered 2022-04-05: 6 ug/min via INTRAVENOUS
  Filled 2022-04-04 (×2): qty 250

## 2022-04-04 NOTE — ED Notes (Signed)
Pt alert, NAD, calm, interactive, resps e/u, speaking in clear complete sentences, VSS, BP soft. Pt has been lying on left and right side.

## 2022-04-04 NOTE — Progress Notes (Addendum)
eLink Physician-Brief Progress Note Patient Name: Rebecca Day DOB: 18-Sep-1985 MRN: [id number]   Date of Service  04/04/2022  HPI/Events of Note  Brief New admit note:  36 yo f, HX of gastric bypass in June on home TPN with PICC, lupus no on meds. N&V on Friday. Presented with fever of 104. No leukocytosis or lactate. Hypotensive on levophed. Got 4L IVF, cultures drawn, on vanc/cefe.   Data: reviewed LA x 2 normal CTH, CT chest and abdomen: un remarkable overall.   Camera evaluation done: VS stable now.  Resting  - continue current care. - ECHO - labs in AM - follow blood culture for any bacteremia , has a PICC line   MAU:[id number] SQ.  CBG goals < 180.   [name]. Neomia Glass, MD, FCCP. [phone]  eICU Interventions       Intervention Category Major Interventions: Infection - evaluation and management;Sepsis - evaluation and management Evaluation Type: New Patient Evaluation  [name] 04/04/2022, 10:12 PM  5:09 c/o generalized itching. No rash. States its been intermittent since PTA. Benadryl? - Benadrul ordered once.   5:36 AM C/O head ache.  Discussed with RN. BP is ok, on one of levo.  - Tylenol once ordered

## 2022-04-04 NOTE — ED Provider Notes (Signed)
[facility] EMERGENCY DEPARTMENT Provider Note   CSN: [id number] Arrival date & time: 04/04/22  1459     History  Chief Complaint  Patient presents with   Fever    Rebecca Day is a 36 y.o. female hx of gastric bypass surgery about a month ago, on TPN via picc line, here with fever. Patient has fever for the last 3-4 days. Patient states that her temperature has been between 102-103.  She states that she just generally do not feel well.  She denies any abdominal pain but is nauseated all the time.  She has some nonproductive cough.  Denies any urinary symptoms.  Denies any neck pain or stiffness.  She does have some headache however  The history is provided by the patient.       Home Medications Prior to Admission medications   Medication Sig Start Date End Date Taking? Authorizing Provider  famotidine (PEPCID) 20 MG tablet TAKE 1 TABLET BY MOUTH EVERYDAY AT BEDTIME 01/20/22   Willia Craze, NP  gabapentin (NEURONTIN) 300 MG capsule Take 1 capsule (300 mg total) by mouth See admin instructions. 1 cap in AM and 2 at night. 10/01/21   Truett Mainland, DO  lidocaine (LIDODERM) 5 % Place 1 patch onto the skin daily. Remove & Discard patch within 12 hours or as directed by MD 12/04/21   Malvin Johns, MD  megestrol (MEGACE) 40 MG tablet Take 40 mg by mouth daily. Patient is taking 40 mg twice a day.    [provider]  metFORMIN (GLUCOPHAGE) 500 MG tablet Take 1 tablet (500 mg total) by mouth 2 (two) times daily with a meal. Patient not taking: Reported on 04/02/2022 10/05/21 10/05/22  [name], FNP  ondansetron (ZOFRAN-ODT) 8 MG disintegrating tablet Take 1 tablet (8 mg total) by mouth every 8 (eight) hours as needed for nausea or vomiting. 5/88/50   Delora Fuel, MD  pantoprazole (PROTONIX) 40 MG tablet Take 1 tablet (40 mg total) by mouth 2 (two) times daily for 14 days. 09/24/21 10/08/21  Thornton Park, MD  predniSONE (DELTASONE) 50 MG tablet Take 1 tablet  (50 mg total) by mouth daily. Patient not taking: Reported on 10/06/7410 8/78/67   Delora Fuel, MD  sucralfate (CARAFATE) 1 g tablet Take 2 tablets (2 g total) by mouth 4 (four) times daily -  with meals and at bedtime. 09/17/21   Willia Craze, NP  traMADol (ULTRAM) 50 MG tablet Take 1 tablet (50 mg total) by mouth every 6 (six) hours as needed. 12/04/21   Malvin Johns, MD      Allergies    Penicillins    Review of Systems   Review of Systems  Constitutional:  Positive for fever.  Respiratory:  Positive for cough.   Neurological:  Positive for headaches.  All other systems reviewed and are negative.   Physical Exam Updated Vital Signs BP (!) 87/53 (BP Location: Left Wrist)   Pulse 94   Temp 99.3 F (37.4 C) (Oral)   Resp 19   Ht '5\' 5"'$  (1.651 m)   Wt 136.1 kg   LMP  (LMP Unknown)   SpO2 99%   BMI 49.92 kg/m  Physical Exam Vitals and nursing note reviewed.  Constitutional:      Comments: Uncomfortable  HENT:     Head: Normocephalic.     Nose: Nose normal.     Mouth/Throat:     Mouth: Mucous membranes are dry.  Eyes:  Extraocular Movements: Extraocular movements intact.     Pupils: Pupils are equal, round, and reactive to light.  Neck:     Comments: No meningeal signs Cardiovascular:     Rate and Rhythm: Regular rhythm. Tachycardia present.  Pulmonary:     Effort: Pulmonary effort is normal.     Comments: Diminished bilaterally Abdominal:     General: Abdomen is flat.     Palpations: Abdomen is soft.     Comments: Mild epigastric tenderness  Musculoskeletal:        General: Normal range of motion.     Cervical back: Normal range of motion and neck supple.  Skin:    General: Skin is warm.     Capillary Refill: Capillary refill takes less than 2 seconds.     Comments: Right arm PICC line with no obvious erythema  Neurological:     General: No focal deficit present.     Mental Status: She is alert and oriented to person, place, and time.     Comments: No  meningeal signs and normal strength bilateral arms and legs  Psychiatric:        Mood and Affect: Mood normal.        Behavior: Behavior normal.     ED Results / Procedures / Treatments   Labs (all labs ordered are listed, but only abnormal results are displayed) Labs Reviewed  COMPREHENSIVE METABOLIC PANEL - Abnormal; Notable for the following components:      Result Value   CO2 21 (*)    Glucose, Bld 131 (*)    Albumin 3.2 (*)    ALT 54 (*)    All other components within normal limits  URINALYSIS, ROUTINE W REFLEX MICROSCOPIC - Abnormal; Notable for the following components:   APPearance HAZY (*)    Hgb urine dipstick SMALL (*)    All other components within normal limits  I-STAT CHEM 8, ED - Abnormal; Notable for the following components:   Glucose, Bld 124 (*)    TCO2 20 (*)    All other components within normal limits  CULTURE, BLOOD (ROUTINE X 2)  CULTURE, BLOOD (ROUTINE X 2)  URINE CULTURE  SARS CORONAVIRUS 2 BY RT PCR  RESP PANEL BY RT-PCR (FLU A&B, COVID) [id number]  CBC WITH DIFFERENTIAL/PLATELET  LACTIC ACID, PLASMA  LACTIC ACID, PLASMA  I-STAT BETA HCG BLOOD, ED ([facility], [facility], [facility] ONLY)    EKG EKG Interpretation  Date/Time:  Sunday April 04 2022 15:17:11 EDT Ventricular Rate:  149 PR Interval:  98 QRS Duration: 75 QT Interval:  290 QTC Calculation: 457 R Axis:   75 Text Interpretation: Sinus tachycardia Ventricular premature complex Aberrant conduction of SV complex(es) RSR' in V1 or V2, probably normal variant Nonspecific T abnormalities, lateral leads Since last tracing rate faster Confirmed by Wandra Arthurs [phone]) on 04/04/2022 4:11:48 PM  Radiology CT ABDOMEN PELVIS W CONTRAST  Result Date: 04/04/2022 CLINICAL DATA:  Abdominal pain, post-op; Pulmonary embolism (PE) suspected, high prob EXAM: CT ANGIOGRAPHY CHEST CT ABDOMEN AND PELVIS WITH CONTRAST TECHNIQUE: Multidetector CT imaging of the chest was performed using the standard protocol during bolus  administration of intravenous contrast. Multiplanar CT image reconstructions and MIPs were obtained to evaluate the vascular anatomy. Multidetector CT imaging of the abdomen and pelvis was performed using the standard protocol during bolus administration of intravenous contrast. RADIATION DOSE REDUCTION: This exam was performed according to the departmental dose-optimization program which includes automated exposure control, adjustment of the mA and/or kV according to  patient size and/or use of iterative reconstruction technique. CONTRAST:  139m OMNIPAQUE IOHEXOL 350 MG/ML SOLN COMPARISON:  August 31, 2021 FINDINGS: CTA CHEST FINDINGS Cardiovascular: Limited assessment of the pulmonary arteries secondary to suboptimal contrast timing. No pulmonary embolism through the proximal segmental pulmonary arteries. Heart is normal in size. No pericardial effusion. Aorta is normal in course and caliber. Mediastinum/Nodes: No axillary or mediastinal adenopathy. Thyroid is unremarkable. Lungs/Pleura: No pleural effusion or pneumothorax. Mild bibasilar atelectasis. There are a few scattered ground-glass opacities in bilateral lung bases and in LEFT upper lobe. Musculoskeletal: No chest wall abnormality. No acute or significant osseous findings. Review of the MIP images confirms the above findings. CT ABDOMEN and PELVIS FINDINGS Hepatobiliary: No focal hepatic lesion. Gallbladder is distended without ancillary evidence of acute cholecystitis. No intrahepatic or extrahepatic biliary ductal dilation. Pancreas: Unremarkable. No pancreatic ductal dilatation or surrounding inflammatory changes. Spleen: Normal in size without focal abnormality. Adrenals/Urinary Tract: Adrenal glands are unremarkable. There is mild bilateral hydronephrosis with eventual excretion on delayed images. The bladder is distended and measures over 12 cm in craniocaudal extent. No obstructing nephrolithiasis. No suspicious renal lesions. Stomach/Bowel: Status  post gastric bypass. No evidence of bowel obstruction. No focal drainable fluid collection adjacent to the postsurgical margins. Appendix is normal. Vascular/Lymphatic: Aorta is normal in course and caliber. No suspicious lymphadenopathy. Retroaortic LEFT renal vein. Reproductive: Uterus and bilateral adnexa are unremarkable. Other: No free air or free fluid. Musculoskeletal: No acute or significant osseous findings. Review of the MIP images confirms the above findings. IMPRESSION: 1. The bladder is distended with mild upstream hydronephrosis bilaterally. This is likely due to urinary retention. 2. No acute pulmonary embolism. 3. Scattered bilateral ground-glass opacities, likely infectious or inflammatory. 4. No evidence of bowel obstruction. Electronically Signed   By: SValentino SaxonM.D.   On: 04/04/2022 19:06   CT Angio Chest PE W and/or Wo Contrast  Result Date: 04/04/2022 CLINICAL DATA:  Abdominal pain, post-op; Pulmonary embolism (PE) suspected, high prob EXAM: CT ANGIOGRAPHY CHEST CT ABDOMEN AND PELVIS WITH CONTRAST TECHNIQUE: Multidetector CT imaging of the chest was performed using the standard protocol during bolus administration of intravenous contrast. Multiplanar CT image reconstructions and MIPs were obtained to evaluate the vascular anatomy. Multidetector CT imaging of the abdomen and pelvis was performed using the standard protocol during bolus administration of intravenous contrast. RADIATION DOSE REDUCTION: This exam was performed according to the departmental dose-optimization program which includes automated exposure control, adjustment of the mA and/or kV according to patient size and/or use of iterative reconstruction technique. CONTRAST:  1056mOMNIPAQUE IOHEXOL 350 MG/ML SOLN COMPARISON:  August 31, 2021 FINDINGS: CTA CHEST FINDINGS Cardiovascular: Limited assessment of the pulmonary arteries secondary to suboptimal contrast timing. No pulmonary embolism through the proximal segmental  pulmonary arteries. Heart is normal in size. No pericardial effusion. Aorta is normal in course and caliber. Mediastinum/Nodes: No axillary or mediastinal adenopathy. Thyroid is unremarkable. Lungs/Pleura: No pleural effusion or pneumothorax. Mild bibasilar atelectasis. There are a few scattered ground-glass opacities in bilateral lung bases and in LEFT upper lobe. Musculoskeletal: No chest wall abnormality. No acute or significant osseous findings. Review of the MIP images confirms the above findings. CT ABDOMEN and PELVIS FINDINGS Hepatobiliary: No focal hepatic lesion. Gallbladder is distended without ancillary evidence of acute cholecystitis. No intrahepatic or extrahepatic biliary ductal dilation. Pancreas: Unremarkable. No pancreatic ductal dilatation or surrounding inflammatory changes. Spleen: Normal in size without focal abnormality. Adrenals/Urinary Tract: Adrenal glands are unremarkable. There is mild bilateral hydronephrosis with  eventual excretion on delayed images. The bladder is distended and measures over 12 cm in craniocaudal extent. No obstructing nephrolithiasis. No suspicious renal lesions. Stomach/Bowel: Status post gastric bypass. No evidence of bowel obstruction. No focal drainable fluid collection adjacent to the postsurgical margins. Appendix is normal. Vascular/Lymphatic: Aorta is normal in course and caliber. No suspicious lymphadenopathy. Retroaortic LEFT renal vein. Reproductive: Uterus and bilateral adnexa are unremarkable. Other: No free air or free fluid. Musculoskeletal: No acute or significant osseous findings. Review of the MIP images confirms the above findings. IMPRESSION: 1. The bladder is distended with mild upstream hydronephrosis bilaterally. This is likely due to urinary retention. 2. No acute pulmonary embolism. 3. Scattered bilateral ground-glass opacities, likely infectious or inflammatory. 4. No evidence of bowel obstruction. Electronically Signed   By: Valentino Saxon M.D.   On: 04/04/2022 19:06   CT HEAD WO CONTRAST (5MM)  Result Date: 04/04/2022 CLINICAL DATA:  New or worsening headache. EXAM: CT HEAD WITHOUT CONTRAST TECHNIQUE: Contiguous axial images were obtained from the base of the skull through the vertex without intravenous contrast. RADIATION DOSE REDUCTION: This exam was performed according to the departmental dose-optimization program which includes automated exposure control, adjustment of the mA and/or kV according to patient size and/or use of iterative reconstruction technique. COMPARISON:  September 28, 2014 FINDINGS: Brain: No evidence of acute infarction, hemorrhage, hydrocephalus, extra-axial collection or mass lesion/mass effect. Vascular: No hyperdense vessel or unexpected calcification. Skull: Normal. Negative for fracture or focal lesion. Sinuses/Orbits: No acute finding. Other: None. IMPRESSION: No acute intracranial abnormality. Electronically Signed   By: Fidela Salisbury M.D.   On: 04/04/2022 18:54   DG Chest Port 1 View  Result Date: 04/04/2022 CLINICAL DATA:  Fever EXAM: PORTABLE CHEST 1 VIEW COMPARISON:  12/04/2021 FINDINGS: 1537 hours. The lungs are clear without focal pneumonia, edema, pneumothorax or pleural effusion. The cardiopericardial silhouette is within normal limits for size. Right PICC line tip overlies the mid SVC level. Telemetry leads overlie the chest. IMPRESSION: No active disease. Electronically Signed   By: Misty Stanley M.D.   On: 04/04/2022 16:06    Procedures Procedures    CRITICAL CARE Performed by: Wandra Arthurs   Total critical care time: 40 minutes  Critical care time was exclusive of separately billable procedures and treating other patients.  Critical care was necessary to treat or prevent imminent or life-threatening deterioration.  Critical care was time spent personally by me on the following activities: development of treatment plan with patient and/or surrogate as well as nursing,  discussions with consultants, evaluation of patient's response to treatment, examination of patient, obtaining history from patient or surrogate, ordering and performing treatments and interventions, ordering and review of laboratory studies, ordering and review of radiographic studies, pulse oximetry and re-evaluation of patient's condition.   Medications Ordered in ED Medications  ceFEPIme (MAXIPIME) 2 g in sodium chloride 0.9 % 100 mL IVPB (has no administration in time range)  vancomycin (VANCOREADY) IVPB 1250 mg/250 mL (has no administration in time range)  norepinephrine (LEVOPHED) '4mg'$  in 249m (0.016 mg/mL) premix infusion (5 mcg/min Intravenous Rate/Dose Change 04/04/22 1930)  sodium chloride 0.9 % bolus 2,000 mL (0 mLs Intravenous Stopped 04/04/22 1707)  acetaminophen (TYLENOL) tablet 500 mg (500 mg Oral Given 04/04/22 1608)  ibuprofen (ADVIL) tablet 800 mg (800 mg Oral Given 04/04/22 1608)  ceFEPIme (MAXIPIME) 2 g in sodium chloride 0.9 % 100 mL IVPB (0 g Intravenous Stopped 04/04/22 1701)  metroNIDAZOLE (FLAGYL) IVPB 500 mg (0 mg Intravenous  Stopped 04/04/22 1725)  vancomycin (VANCOREADY) IVPB 2000 mg/400 mL (0 mg Intravenous Stopped 04/04/22 1918)  sodium chloride 0.9 % bolus 1,000 mL (0 mLs Intravenous Stopped 04/04/22 1725)  lactated ringers bolus 1,000 mL (0 mLs Intravenous Stopped 04/04/22 1805)  iohexol (OMNIPAQUE) 350 MG/ML injection 100 mL (100 mLs Intravenous Contrast Given 04/04/22 1847)    ED Course/ Medical Decision Making/ A&P                           Medical Decision Making Veryl Abril is a 36 y.o. female here presenting with fever and cough and epigastric pain.  Patient had gastric bypass about a month ago.  Patient is currently on TPN.  Patient is febrile and tachycardic and hypotensive.  Code sepsis initiated.  I think likely source is either UTI versus pneumonia.  Given her recent surgery I also consider PE.  Patient has no meningeal signs to suggest meningitis.  I discussed risks  and benefits of LP and she wants to hold off at this point.  Plan to get CBC and CMP and lactate and cultures and CTA chest and CT abdomen pelvis  7:35 PM Patient's white blood cell count is normal and lactate is normal.  Patient was given 4 L Bolus (30 cc/kg) as well as broad-spectrum antibiotics.  Patient's chest x-ray showed possible pneumonia.  CT abdomen pelvis showed distended bladder but then she was able to urinate afterwards. Patient has no meningeal signs.  Her temperature is down to 99.  However her blood pressure is down to the 70s-80s.  I am concerned for possible septic shock from pneumonia.  Patient is started on levophed.  ICU to admit.   Problems Addressed: Septic shock [facility]): acute illness or injury  Amount and/or Complexity of Data Reviewed Labs: ordered. Decision-making details documented in ED Course. Radiology: ordered and independent interpretation performed. Decision-making details documented in ED Course. ECG/medicine tests: ordered and independent interpretation performed. Decision-making details documented in ED Course.  Risk OTC drugs. Prescription drug management.    Final Clinical Impression(s) / ED Diagnoses Final diagnoses:  None    Rx / DC Orders ED Discharge Orders     None         Drenda Freeze, MD 04/04/22 [phone]

## 2022-04-04 NOTE — ED Triage Notes (Signed)
BIB [facility] from home for fever, onset yesterday, spiked higher today 105, APAP '500mg'$   today, no known source. Denies other sx. H/o gastric bypass and lupus. HR '150mg'$ , BP 90 palpated, no IV. Prednisone 2 weeks ago for allergic reaction.

## 2022-04-04 NOTE — ED Notes (Signed)
Critical care at bedside to assess pt. This RN expressed concerns about inconsistent blood pressure readings. Per CCM suggestion, blood pressure cuff rotated from left wrist to left upper arm.

## 2022-04-04 NOTE — Progress Notes (Signed)
[facility] following for sepsis protocol. 

## 2022-04-04 NOTE — ED Notes (Signed)
Pending CT. Pt aware of need for urine sample. Alert, NAD, calm, interactive, cooperative.

## 2022-04-04 NOTE — Progress Notes (Signed)
Pharmacy Antibiotic Note  Rebecca Day is a 36 y.o. female admitted on 04/04/2022 presenting with fever, concern for sepsis.  Pharmacy has been consulted for cefepime and vancomycin dosing.  Plan: Vancomycin 2000 mg IV x 1, then 1250 mg IV q 12h (eAUC 464, SCr 0.8) Cefepime 2g IV every 8 hours Monitor renal function, Cx and clinical progression to narrow Vancomycin levels as needed  Height: '5\' 5"'$  (165.1 cm) Weight: 136.1 kg (300 lb) IBW/kg (Calculated) : 57  Temp (24hrs), Avg:104.4 F (40.2 C), Min:104.4 F (40.2 C), Max:104.4 F (40.2 C)  Recent Labs  Lab 04/04/22 1530 04/04/22 1553  WBC 6.5  --   CREATININE  --  0.80    Estimated Creatinine Clearance: 139.9 mL/min (by C-G formula based on SCr of 0.8 mg/dL).    Allergies  Allergen Reactions   Penicillins Hives    Has patient had a PCN reaction causing immediate rash, facial/tongue/throat swelling, SOB or lightheadedness with hypotension: Yes Has patient had a PCN reaction causing severe rash involving mucus membranes or skin necrosis: No Has patient had a PCN reaction that required hospitalization: Yes Has patient had a PCN reaction occurring within the last 10 years: NO If all of the above answers are "NO", then may proceed with Cephalosporin use.    Bertis Ruddy, PharmD Clinical Pharmacist ED Pharmacist Phone # [phone] 04/04/2022 4:02 PM

## 2022-04-04 NOTE — H&P (Signed)
NAMEJolita Day, MRN:  [id number], DOB:  04/15/1986, LOS: 0 ADMISSION DATE:  04/04/2022, CONSULTATION DATE:  8/6 REFERRING MD:  Darl Householder, CHIEF COMPLAINT: Fever  History of Present Illness:   Rebecca Day, is a 36 y.o. female, who presented to the [facility] ED with a chief complaint of fever  They have a pertinent past medical history of gastric bypass surgery (01/28/2022, gastric sleeve), on TPN post procedure, lupus not currently on medications.  Prior to arrival, patient denies sick contacts.  Recently returned to work at office job.  Developed nausea and vomiting on Friday 8/4, developed generalized weakness, family having to help at home with TPN.  Patient developed a fever over 103 at home and EMS was called.  ED course was notable for temperature of 104.4 Fahrenheit, no leukocytosis, no lactic acid elevation.  CTA chest negative for PE, bilateral lower lobe suspicious for infection.  Patient presenting with PICC line for home TPN.  She became hypotensive with a blood pressure of 79/33.  Levophed was started.  She was given 4 L of IV fluid.  Blood cultures and urine cultures were collected.  She was started on vancomycin and cefepime.  Endorses headache.  Denies neck stiffness or pain, no pain on the elevation, no abdominal pain, no light sensitivity.  GCS 15.  On room air.  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  Blood cultures >, vancomycin/cefepime >, CTh negative, CT PE negative for PE, CT abdomen negative for obstruction  Interim History / Subjective:  See above  Levophed 5 mics  Subjective: Endorses headache, denies neck pain, denies pain with lifting knees, denies light sensitivity, denies  shortness of breath, denies chest pain, denies abdominal pain  Objective   Blood pressure (!) 87/53, pulse 94, temperature 99.3 F (37.4 C), temperature source Oral, resp. rate 19, height '5\' 5"'$  (1.651 m), weight 136.1 kg, SpO2 99 %.        Intake/Output Summary (Last 24 hours) at 04/04/2022 1933 Last data filed at 04/04/2022 1918 Gross per 24 hour  Intake 4600 ml  Output 600 ml  Net 4000 ml   Filed Weights   04/04/22 1515  Weight: 136.1 kg    Examination: General: In bed, NAD, appears comfortable, generalized weakness, obese HEENT: MM pink/dry, anicteric, atraumatic Neuro: RASS -1, PERRL 1m, GCS 15 CV: S1S2, NSR, no m/r/g appreciated PULM:  clear in the upper lobes, clear in the lower lobes, trachea midline, chest expansion symmetric GI: soft, bsx4 active, non-tender   Extremities: warm/dry, no pretibial edema, capillary refill less than 3 seconds  Skin: RT PICC, no erythema noted, no rashes or lesions noted, abdominal surgical site clean dry and intact     Labs/imaging Blood culture pending CBC reviewed-no leukocytosis CO2 21 ALT 54 Lactic 1.7 UA negative nitrate, negative leukocytes, no bacteria seen Respiratory culture pending  CT head: No acute intracranial abnormality CT abdomen pelvis with contrast: Bladder mildly distended with upstream hydronephrosis.  Likely urinary retention per radiology.  No evidence of bowel obstruction. CTA PE: Negative for PE, bilateral groundglass opacities, question pneumonia Twelve-lead: Sinus tach, no significant ST changes noted  Resolved Hospital Problem list     Assessment & Plan:  Shock, undifferentiated Fever ? septic shock versus hypovolemia  in setting of nausea and vomiting on TPN.  Fever of 104.4, however no leukocytosis or lactate elevation.  Patient denies sick contacts.  Respiratory panel pending.  Not tachycardic.  Patient with PICC for TPN> question possible BSI.  Patient endorses headache, however no other neurologic  findings (not altered, no neck pain, no light sensitivity, no pain in back when lifting knees).  UA appears negative.  CTa chest, CT abdomen pelvis: Negative for PE, bilateral lower lobe spots that could be possible infection.  Patient could have possibly aspirated with vomiting on Friday.  No abdominal source per radiology. -Goal MAP 65 or greater. Levophed ordered. Titrate medication to goal -S/P 4000 ml fluid resusitation.  Start LR at 116m/hr for 8 hours -[facility] -Continue Cefepime and Vancomycin. Narrow as cultures result -Trend lactate -Obtain procalcitonin, MRSA PCR -Obtain ECHO  Headache CT head negative. GCS 15. no neck pain, no light sensitivity, no pain in back when lifting knees -Monitor neuro exam -APAP for pain control  NAGMA CO2 21, anion gap 9.  Question secondary nausea and vomiting. -Recheck in a.m. status post fluid resuscitation  History gastric bypass surgery (01/28/2022, gastric sleeve) Current home TPN use -Start regular diet -Consider a.m. TPN consult.  History of lupus Patient states currently not taking meds  Best Practice (right click and "Reselect all SmartList Selections" daily)   Diet/type: Regular consistency (see orders) DVT prophylaxis: LMWH GI prophylaxis: N/A Lines: N/A Foley:  N/A Code Status:  full code Last date of multidisciplinary goals of care discussion [Full scope]  Labs   CBC: Recent Labs  Lab 04/04/22 1530 04/04/22 1553  WBC 6.5  --   NEUTROABS 5.2  --   HGB 13.3 13.9  HCT 39.3 41.0  MCV 88.1  --   PLT 223  --     Basic Metabolic Panel: Recent Labs  Lab 04/04/22 1530 04/04/22 1553  NA 135 136  K 4.0 3.9  CL 105 103  CO2 21*  --   GLUCOSE 131* 124*  BUN 10 10  CREATININE 0.94 0.80  CALCIUM 9.1  --    GFR: Estimated Creatinine Clearance: 139.9 mL/min (by C-G formula based on SCr of 0.8 mg/dL). Recent Labs  Lab 04/04/22 1530  WBC 6.5  LATICACIDVEN 1.7    Liver Function Tests: Recent  Labs  Lab 04/04/22 1530  AST 37  ALT 54*  ALKPHOS 92  BILITOT 0.9  PROT 6.9  ALBUMIN 3.2*   No results for input(s): "LIPASE", "AMYLASE" in the last 168 hours. No results for input(s): "AMMONIA" in the last 168 hours.  ABG    Component Value Date/Time   TCO2 20 (L) 04/04/2022 1553     Coagulation Profile: No results for input(s): "INR", "PROTIME" in the last 168 hours.  Cardiac Enzymes: No results for input(s): "CKTOTAL", "CKMB", "CKMBINDEX", "TROPONINI" in the last 168 hours.  HbA1C: Hgb A1c MFr Bld  Date/Time Value Ref Range Status  10/05/2021 11:20 AM 5.7 (H) 4.8 - 5.6 % Final    Comment:             Prediabetes: 5.7 - 6.4          Diabetes: >6.4          Glycemic control for adults with diabetes: <7.0   07/28/2021 09:01 AM 5.9 (H) 4.8 - 5.6 % Final    Comment:             Prediabetes: 5.7 - 6.4  Diabetes: >6.4          Glycemic control for adults with diabetes: <7.0     CBG: No results for input(s): "GLUCAP" in the last 168 hours.  Review of Systems:   Positives in bold  Gen: fever, chills, weight change, fatigue, night sweats HEENT:  blurred vision, double vision, hearing loss, tinnitus, sinus congestion, rhinorrhea, sore throat, neck stiffness, dysphagia PULM:  shortness of breath, cough, sputum production, hemoptysis, wheezing CV: chest pain, edema, orthopnea, paroxysmal nocturnal dyspnea, palpitations GI:  abdominal pain, nausea, vomiting, diarrhea, hematochezia, melena, constipation, change in bowel habits GU: dysuria, hematuria, polyuria, oliguria, urethral discharge Endocrine: hot or cold intolerance, polyuria, polyphagia or appetite change Derm: rash, dry skin, scaling or peeling skin change Heme: easy bruising, bleeding, bleeding gums Neuro: headache, numbness, weakness, slurred speech, loss of memory or consciousness   Past Medical History:  She,  has a past medical history of Allergy, Anxiety, Asthma, Blood transfusion without  reported diagnosis, Clotting disorder ([place]), Elevated blood sugar, Kidney stones, Lupus ([place]), 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[place]), On total parenteral nutrition (TPN), Ovarian cyst, and PMDD (premenstrual dysphoric disorder).   Surgical History:   Past Surgical History:  Procedure Laterality Date   CESAREAN SECTION     C/S x 2   LAPAROSCOPIC GASTRIC SLEEVE RESECTION N/A 01/28/2022   PILONIDAL CYST DRAINAGE       Social History:   reports that she has never smoked. She has never used smokeless tobacco. She reports current alcohol use. She reports that she does not use drugs.   Family History:  Her family history includes Cancer in her brother and father; Colon cancer in her father, maternal aunt, and another family member; Colon polyps in her mother; Hyperlipidemia in her maternal grandmother; Hypertension in her mother; Kidney disease in her brother; Liver disease in her brother. There is no history of Esophageal cancer, Rectal cancer, Stomach cancer, or Breast cancer.   Allergies Allergies  Allergen Reactions   Penicillins Hives    Has patient had a PCN reaction causing immediate rash, facial/tongue/throat swelling, SOB or lightheadedness with hypotension: Yes Has patient had a PCN reaction causing severe rash involving mucus membranes or skin necrosis: No Has patient had a PCN reaction that required hospitalization: Yes Has patient had a PCN reaction occurring within the last 10 years: NO If all of the above answers are "NO", then may proceed with Cephalosporin use.     Home Medications  Prior to Admission medications   Medication Sig Start Date End Date Taking? Authorizing Provider  famotidine (PEPCID) 20 MG tablet TAKE 1 TABLET BY MOUTH EVERYDAY AT BEDTIME 01/20/22   Willia Craze, NP  gabapentin (NEURONTIN) 300 MG capsule Take 1 capsule (300 mg total) by mouth See admin instructions. 1 cap in AM and 2 at night. 10/01/21   Truett Mainland, DO  lidocaine (LIDODERM) 5 % Place 1 patch onto  the skin daily. Remove & Discard patch within 12 hours or as directed by MD 12/04/21   Malvin Johns, MD  megestrol (MEGACE) 40 MG tablet Take 40 mg by mouth daily. Patient is taking 40 mg twice a day.    [provider]  metFORMIN (GLUCOPHAGE) 500 MG tablet Take 1 tablet (500 mg total) by mouth 2 (two) times daily with a meal. Patient not taking: Reported on 04/02/2022 10/05/21 10/05/22  [name], FNP  ondansetron (ZOFRAN-ODT) 8 MG disintegrating tablet Take 1 tablet (8 mg total) by mouth every 8 (eight) hours as needed for nausea  or vomiting. 3/56/86   Delora Fuel, MD  pantoprazole (PROTONIX) 40 MG tablet Take 1 tablet (40 mg total) by mouth 2 (two) times daily for 14 days. 09/24/21 10/08/21  Thornton Park, MD  predniSONE (DELTASONE) 50 MG tablet Take 1 tablet (50 mg total) by mouth daily. Patient not taking: Reported on 09/04/8370 05/01/10   Delora Fuel, MD  sucralfate (CARAFATE) 1 g tablet Take 2 tablets (2 g total) by mouth 4 (four) times daily -  with meals and at bedtime. 09/17/21   Willia Craze, NP  traMADol (ULTRAM) 50 MG tablet Take 1 tablet (50 mg total) by mouth every 6 (six) hours as needed. 12/04/21   Malvin Johns, MD     Critical care time: 27 minutes    [employer]., MSN, APRN, AGACNP-BC  Pulmonary & Critical Care  04/04/2022 , 8:42 PM  Please see [email or web address] for pager details  If no response, please call [phone] After hours, please call Elink at [phone]

## 2022-04-04 NOTE — ED Notes (Signed)
Dr. Darl Householder over to see

## 2022-04-05 ENCOUNTER — Inpatient Hospital Stay (HOSPITAL_COMMUNITY): Payer: No Typology Code available for payment source

## 2022-04-05 DIAGNOSIS — R609 Edema, unspecified: Secondary | ICD-10-CM

## 2022-04-05 DIAGNOSIS — R509 Fever, unspecified: Secondary | ICD-10-CM

## 2022-04-05 DIAGNOSIS — R579 Shock, unspecified: Secondary | ICD-10-CM | POA: Diagnosis not present

## 2022-04-05 DIAGNOSIS — R6521 Severe sepsis with septic shock: Secondary | ICD-10-CM | POA: Diagnosis not present

## 2022-04-05 LAB — BLOOD CULTURE ID PANEL (REFLEXED) - BCID2

## 2022-04-05 LAB — PROCALCITONIN: Procalcitonin: 1.28 ng/mL

## 2022-04-05 LAB — ECHOCARDIOGRAM COMPLETE
Area-P 1/2: 3.95 cm2
Calc EF: 56.6 %
Height: 68 in
MV VTI: 4.27 cm2
S' Lateral: 3.1 cm
Single Plane A2C EF: 60.5 %
Single Plane A4C EF: 55.1 %
Weight: 4504.44 oz

## 2022-04-05 LAB — CBC
HCT: 32.5 % — ABNORMAL LOW (ref 36.0–46.0)
Hemoglobin: 10.7 g/dL — ABNORMAL LOW (ref 12.0–15.0)
MCH: 29.8 pg (ref 26.0–34.0)
MCHC: 32.9 g/dL (ref 30.0–36.0)
MCV: 90.5 fL (ref 80.0–100.0)
Platelets: 194 10*3/uL (ref 150–400)
RBC: 3.59 MIL/uL — ABNORMAL LOW (ref 3.87–5.11)
RDW: 13.4 % (ref 11.5–15.5)
WBC: 5.7 10*3/uL (ref 4.0–10.5)
nRBC: 0 % (ref 0.0–0.2)

## 2022-04-05 LAB — CORTISOL-AM, BLOOD: Cortisol - AM: 9.2 ug/dL (ref 6.7–22.6)

## 2022-04-05 LAB — BASIC METABOLIC PANEL
Anion gap: 9 (ref 5–15)
BUN: 8 mg/dL (ref 6–20)
CO2: 20 mmol/L — ABNORMAL LOW (ref 22–32)
Calcium: 8.1 mg/dL — ABNORMAL LOW (ref 8.9–10.3)
Chloride: 110 mmol/L (ref 98–111)
Creatinine, Ser: 0.64 mg/dL (ref 0.44–1.00)
GFR, Estimated: >60 mL/min (ref >60)
Glucose, Bld: 93 mg/dL (ref 70–99)
Potassium: 3.6 mmol/L (ref 3.5–5.1)
Sodium: 139 mmol/L (ref 135–145)

## 2022-04-05 LAB — GLUCOSE, CAPILLARY
Glucose-Capillary: 85 mg/dL (ref 70–99)
Glucose-Capillary: 93 mg/dL (ref 70–99)
Glucose-Capillary: 79 mg/dL (ref 70–99)
Glucose-Capillary: 117 mg/dL — ABNORMAL HIGH (ref 70–99)
Glucose-Capillary: 103 mg/dL — ABNORMAL HIGH (ref 70–99)
Glucose-Capillary: 157 mg/dL — ABNORMAL HIGH (ref 70–99)

## 2022-04-05 LAB — PHOSPHORUS: Phosphorus: 3.9 mg/dL (ref 2.5–4.6)

## 2022-04-05 LAB — MAGNESIUM: Magnesium: 1.7 mg/dL (ref 1.7–2.4)

## 2022-04-05 MED ORDER — SODIUM CHLORIDE 0.9 % IV SOLN
2.0000 g | INTRAVENOUS | Status: AC
  Administered 2022-04-05 – 2022-04-06 (×2): 2 g via INTRAVENOUS
  Filled 2022-04-05 (×2): qty 20

## 2022-04-05 MED ORDER — DIPHENHYDRAMINE HCL 25 MG PO CAPS
25.0000 mg | ORAL_CAPSULE | Freq: Once | ORAL | Status: AC
Start: 2022-04-05 — End: 2022-04-05
  Administered 2022-04-05: 25 mg via ORAL
  Filled 2022-04-05: qty 1

## 2022-04-05 MED ORDER — LACTATED RINGERS IV BOLUS
500.0000 mL | Freq: Once | INTRAVENOUS | Status: AC
  Administered 2022-04-05: 500 mL via INTRAVENOUS

## 2022-04-05 MED ORDER — POTASSIUM CHLORIDE CRYS ER 20 MEQ PO TBCR
40.0000 meq | EXTENDED_RELEASE_TABLET | Freq: Once | ORAL | Status: AC
  Administered 2022-04-05: 40 meq via ORAL
  Filled 2022-04-05: qty 2

## 2022-04-05 MED ORDER — ENOXAPARIN SODIUM 60 MG/0.6ML IJ SOSY
60.0000 mg | PREFILLED_SYRINGE | INTRAMUSCULAR | Status: DC
  Administered 2022-04-05 – 2022-04-09 (×5): 60 mg via SUBCUTANEOUS
  Filled 2022-04-05 (×5): qty 0.6

## 2022-04-05 MED ORDER — TRAVASOL 10 % IV SOLN
INTRAVENOUS | Status: AC
  Filled 2022-04-05: qty 912

## 2022-04-05 MED ORDER — ACETAMINOPHEN 500 MG PO TABS
500.0000 mg | ORAL_TABLET | ORAL | Status: DC | PRN
  Administered 2022-04-05 – 2022-04-10 (×6): 500 mg via ORAL
  Filled 2022-04-05 (×6): qty 1

## 2022-04-05 MED ORDER — LACTATED RINGERS IV SOLN: INTRAVENOUS | Status: AC

## 2022-04-05 MED ORDER — KETOROLAC TROMETHAMINE 15 MG/ML IJ SOLN
15.0000 mg | Freq: Once | INTRAMUSCULAR | Status: AC
  Administered 2022-04-05: 15 mg via INTRAVENOUS
  Filled 2022-04-05: qty 1

## 2022-04-05 MED ORDER — MAGNESIUM SULFATE 2 GM/50ML IV SOLN
2.0000 g | Freq: Once | INTRAVENOUS | Status: AC
  Administered 2022-04-05: 2 g via INTRAVENOUS
  Filled 2022-04-05: qty 50

## 2022-04-05 MED ORDER — INSULIN ASPART 100 UNIT/ML IJ SOLN
0.0000 [IU] | INTRAMUSCULAR | Status: DC
  Administered 2022-04-05 – 2022-04-06 (×4): 1 [IU] via SUBCUTANEOUS

## 2022-04-05 MED ORDER — IBUPROFEN 200 MG PO TABS
400.0000 mg | ORAL_TABLET | Freq: Four times a day (QID) | ORAL | Status: DC | PRN
Start: 2022-04-05 — End: 2022-04-15
  Administered 2022-04-05 – 2022-04-14 (×8): 400 mg via ORAL
  Filled 2022-04-05 (×8): qty 2

## 2022-04-05 MED ORDER — ACETAMINOPHEN 500 MG PO TABS
500.0000 mg | ORAL_TABLET | Freq: Once | ORAL | Status: AC
Start: 2022-04-05 — End: 2022-04-05
  Administered 2022-04-05: 500 mg via ORAL
  Filled 2022-04-05: qty 1

## 2022-04-05 NOTE — Progress Notes (Signed)
PHARMACY - TOTAL PARENTERAL NUTRITION CONSULT NOTE   Indication:  continuation of home therapy, patient is on TPN for nausea and vomiting following gastric bypass surgery   Patient Measurements: Height: '5\' 8"'$  (172.7 cm) Weight: 127.7 kg (281 lb 8.4 oz) IBW/kg (Calculated) : 63.9 TPN AdjBW (KG): 79.9 Body mass index is 42.81 kg/m. Usual Weight: 127kg   Assessment: Patient is continuing TPN therapy from home. All current electrolytes are within normal range. Home TPN order from [facility] reviewed.   Glucose / Insulin: Blood sugars within goal range ('180mg'$ /dL)  Electrolytes: Within normal range  Renal: Renal function appropriate, CrCl 141.34m/min  Hepatic: Normal hepatic function  Intake / Output; MIVF:  no MIVF, patient has tolerated limited oral intake today  GI Imaging:  8/6 CT abdomen pelvis: normal  GI Surgeries / Procedures:  Gastric bypass procedure in June 2023   Central access: One Lumen PICC line  TPN start date: 04/05/2022, inpatient   Nutritional Goals: Goal TPN rate is 100 mL/hr (provides 91.2 g of protein and 2025 kcals per day)  RD Assessment: RD has been consulted, recommendations pending   Current Nutrition:  TPN and regular diet   Plan:  Start TPN at 1061mhr at 1800 Electrolytes in TPN: Na 5040mL, K 39m60m, Ca 5mEq72m Mg 5mEq/30mand Phos 15mmol89mCl:Ac 1:1 Add standard MVI and trace elements to TPN Initiate Sensitive q4h SSI and adjust as needed  Monitor TPN labs on Mon/Thurs, daily CMP ordered for 8/8 to assess TPN   HeatherVentura Sellers23,11:33 AM

## 2022-04-05 NOTE — Progress Notes (Signed)
eLink Physician-Brief Progress Note Patient Name: Rebecca Day DOB: 1986-06-15 MRN: [id number]   Date of Service  04/05/2022  HPI/Events of Note  Headache (not new, has history of migraine, nothing focal). Got her tylenol and ibuprofen but not much relief.   eICU Interventions  Toradol x 1     Intervention Category Intermediate Interventions: Pain - evaluation and management  Margaretmary Lombard 04/05/2022, 8:58 PM

## 2022-04-05 NOTE — Progress Notes (Signed)
NAMEJeananne Day, MRN:  [id number], DOB:  Feb 05, 1986, LOS: 1 ADMISSION DATE:  04/04/2022, CONSULTATION DATE:  8/6 REFERRING MD:  Rebecca Day, CHIEF COMPLAINT: Fever  History of Present Illness:   Rebecca Day, is a 36 y.o. 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  Prior to arrival, patient denies sick contacts.  Recently returned to work at office job.  Developed nausea and vomiting on Friday 8/4, developed generalized weakness, family having to help at home with TPN.  Patient developed a fever over 103 at home and EMS was called.  ED course was notable for temperature of 104.4 Fahrenheit, no leukocytosis, no lactic acid elevation.  CTA chest negative for PE, bilateral lower lobe suspicious for infection.  Patient presenting with PICC line for home TPN.  She became hypotensive with a blood pressure of 79/33.  Levophed was started.  She was given 4 L of IV fluid.  Blood cultures and urine cultures were collected.  She was started on vancomycin and cefepime.  Endorses headache.  Denies neck stiffness or pain, no pain on the elevation, no abdominal pain, no light sensitivity.  GCS 15.  On room air.  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  Blood cultures >, vancomycin/cefepime >, CTh negative, CT PE negative for PE, CT abdomen negative for obstruction  Interim History / Subjective:  Off Levophed Awake and alert Feeling overall better  Objective   Blood pressure 94/60, pulse 73, temperature 98.3 F (36.8 C), temperature  source Oral, resp. rate (!) 25, height '5\' 8"'$  (1.727 m), weight 127.7 kg, SpO2 95 %.        Intake/Output Summary (Last 24 hours) at 04/05/2022 6606 Last data filed at 04/05/2022 0600 Gross per 24 hour  Intake 6035.12 ml  Output 1500 ml  Net 4535.12 ml   Filed Weights   04/04/22 1515 04/04/22 2140 04/05/22 0500  Weight: 136.1 kg 127.7 kg 127.7 kg    Examination: General: Middle-aged, obese, does not appear to be in distress HEENT: Moist oral mucosa Neuro: No neuro focality, moving all extremities CV: S1-S2 appreciated with no murmur PULM: Clear breath sounds bilaterally  GI: Bowel sounds appreciated Extremities: Skin is warm and dry, no edema Skin: PICC site-no erythema, nontender     Labs/imaging Blood culture -no growth so far CBC reviewed-no leukocytosis CO2 21 ALT 54 Lactic 1.7 UA negative nitrate, negative leukocytes, no bacteria seen Respiratory culture pending SARS negative  CT head: No acute intracranial abnormality CT abdomen pelvis with contrast: Bladder mildly distended with upstream hydronephrosis.  Likely urinary retention per radiology.  No evidence of bowel obstruction. CTA PE: Negative for PE, bilateral groundglass opacities, question pneumonia Twelve-lead: Sinus tach, no significant ST changes noted  Resolved Hospital Problem list     Assessment & Plan:   Septic shock -Source is unclear at present -No focality -Was able to wean off Levophed -4 L fluid resuscitation, remains on IV fluids at present -Cultures obtained, no growth so far -No significant lactate elevation  Headache -CT head negative -No neurological focality  Fever May  be related to viral syndrome Groundglass changes on CT scan of the chest-was not more short of breath, no significant cough, was not really bringing up anything, no diarrhea, no urinary symptoms -No specific source of present -Continue to follow cultures -Narrow antibiotics -Discontinue vancomycin -Switch to  Rocephin  Follow-up on echocardiogram  History of gastric bypass surgery 01/28/2022, gastric sleeve On TPN  History of lupus -Was not on any medications prior to admission  Did receive cefepime, vancomycin and Flagyl  Best Practice (right click and "Reselect all SmartList Selections" daily)   Diet/type: Regular consistency (see orders) DVT prophylaxis: LMWH GI prophylaxis: N/A Lines: N/A Foley:  N/A Code Status:  full code Last date of multidisciplinary goals of care discussion [Full scope]  Labs   CBC: Recent Labs  Lab 04/04/22 1530 04/04/22 1553 04/05/22 0115  WBC 6.5  --  5.7  NEUTROABS 5.2  --   --   HGB 13.3 13.9 10.7*  HCT 39.3 41.0 32.5*  MCV 88.1  --  90.5  PLT 223  --  161    Basic Metabolic Panel: Recent Labs  Lab 04/04/22 1530 04/04/22 1553 04/05/22 0115  NA 135 136 139  K 4.0 3.9 3.6  CL 105 103 110  CO2 21*  --  20*  GLUCOSE 131* 124* 93  BUN '10 10 8  '$ CREATININE 0.94 0.80 0.64  CALCIUM 9.1  --  8.1*  MG  --   --  1.7  PHOS  --   --  3.9   GFR: Estimated Creatinine Clearance: 141.2 mL/min (by C-G formula based on SCr of 0.64 mg/dL). Recent Labs  Lab 04/04/22 1530 04/04/22 1930 04/05/22 0115  PROCALCITON 0.66  --  1.28  WBC 6.5  --  5.7  LATICACIDVEN 1.7 0.9  --     Liver Function Tests: Recent Labs  Lab 04/04/22 1530  AST 37  ALT 54*  ALKPHOS 92  BILITOT 0.9  PROT 6.9  ALBUMIN 3.2*   No results for input(s): "LIPASE", "AMYLASE" in the last 168 hours. No results for input(s): "AMMONIA" in the last 168 hours.  ABG    Component Value Date/Time   TCO2 20 (L) 04/04/2022 1553     Coagulation Profile: No results for input(s): "INR", "PROTIME" in the last 168 hours.  Cardiac Enzymes: No results for input(s): "CKTOTAL", "CKMB", "CKMBINDEX", "TROPONINI" in the last 168 hours.  HbA1C: Hgb A1c MFr Bld  Date/Time Value Ref Range Status  10/05/2021 11:20 AM 5.7 (H) 4.8 - 5.6 % Final    Comment:             Prediabetes: 5.7 -  6.4          Diabetes: >6.4          Glycemic control for adults with diabetes: <7.0   07/28/2021 09:01 AM 5.9 (H) 4.8 - 5.6 % Final    Comment:             Prediabetes: 5.7 - 6.4          Diabetes: >6.4          Glycemic control for adults with diabetes: <7.0     CBG: Recent Labs  Lab 04/04/22 2141 04/05/22 0311 04/05/22 0824  GLUCAP 111* 85 93    Review of Systems:   Positives in bold  Gen: fever, chills, weight change, fatigue, night sweats HEENT:  blurred vision, double vision, hearing loss, tinnitus, sinus congestion, rhinorrhea, sore throat, neck stiffness, dysphagia PULM:  shortness of  breath, cough, sputum production, hemoptysis, wheezing CV: chest pain, edema, orthopnea, paroxysmal nocturnal dyspnea, palpitations GI:  abdominal pain, nausea, vomiting, diarrhea, hematochezia, melena, constipation, change in bowel habits GU: dysuria, hematuria, polyuria, oliguria, urethral discharge Endocrine: hot or cold intolerance, polyuria, polyphagia or appetite change Derm: rash, dry skin, scaling or peeling skin change Heme: easy bruising, bleeding, bleeding gums Neuro: headache, numbness, weakness, slurred speech, loss of memory or consciousness   Past Medical History:  She,  has a past medical history of Allergy, Anxiety, Asthma, Blood transfusion without reported diagnosis, Clotting disorder ([place]), Elevated blood sugar, Kidney stones, Lupus ([place]), 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[place]), On total parenteral nutrition (TPN), Ovarian cyst, and PMDD (premenstrual dysphoric disorder).   Surgical History:   Past Surgical History:  Procedure Laterality Date   CESAREAN SECTION     C/S x 2   LAPAROSCOPIC GASTRIC SLEEVE RESECTION N/A 01/28/2022   PILONIDAL CYST DRAINAGE       Social History:   reports that she has never smoked. She has never used smokeless tobacco. She reports current alcohol use. She reports that she does not use drugs.   Family History:  Her family history includes Cancer  in her brother and father; Colon cancer in her father, maternal aunt, and another family member; Colon polyps in her mother; Hyperlipidemia in her maternal grandmother; Hypertension in her mother; Kidney disease in her brother; Liver disease in her brother. There is no history of Esophageal cancer, Rectal cancer, Stomach cancer, or Breast cancer.   Allergies Allergies  Allergen Reactions   Penicillins Hives    Has patient had a PCN reaction causing immediate rash, facial/tongue/throat swelling, SOB or lightheadedness with hypotension: Yes Has patient had a PCN reaction causing severe rash involving mucus membranes or skin necrosis: No Has patient had a PCN reaction that required hospitalization: Yes Has patient had a PCN reaction occurring within the last 10 years: NO If all of the above answers are "NO", then may proceed with Cephalosporin use.     Home Medications  Prior to Admission medications   Medication Sig Start Date End Date Taking? Authorizing Provider  famotidine (PEPCID) 20 MG tablet TAKE 1 TABLET BY MOUTH EVERYDAY AT BEDTIME 01/20/22   Willia Craze, NP  gabapentin (NEURONTIN) 300 MG capsule Take 1 capsule (300 mg total) by mouth See admin instructions. 1 cap in AM and 2 at night. 10/01/21   Truett Mainland, DO  lidocaine (LIDODERM) 5 % Place 1 patch onto the skin daily. Remove & Discard patch within 12 hours or as directed by MD 12/04/21   Malvin Johns, MD  megestrol (MEGACE) 40 MG tablet Take 40 mg by mouth daily. Patient is taking 40 mg twice a day.    [provider]  metFORMIN (GLUCOPHAGE) 500 MG tablet Take 1 tablet (500 mg total) by mouth 2 (two) times daily with a meal. Patient not taking: Reported on 04/02/2022 10/05/21 10/05/22  [name], FNP  ondansetron (ZOFRAN-ODT) 8 MG disintegrating tablet Take 1 tablet (8 mg total) by mouth every 8 (eight) hours as needed for nausea or vomiting. 4/40/10   Delora Fuel, MD  pantoprazole (PROTONIX) 40 MG tablet Take 1  tablet (40 mg total) by mouth 2 (two) times daily for 14 days. 09/24/21 10/08/21  Thornton Park, MD  predniSONE (DELTASONE) 50 MG tablet Take 1 tablet (50 mg total) by mouth daily. Patient not taking: Reported on 10/06/2534 6/44/03   Delora Fuel, MD  sucralfate (CARAFATE) 1 g tablet Take 2 tablets (2  g total) by mouth 4 (four) times daily -  with meals and at bedtime. 09/17/21   Willia Craze, NP  traMADol (ULTRAM) 50 MG tablet Take 1 tablet (50 mg total) by mouth every 6 (six) hours as needed. 12/04/21   Malvin Johns, MD    The patient is critically ill with multiple organ systems failure and requires high complexity decision making for assessment and support, frequent evaluation and titration of therapies, application of advanced monitoring technologies and extensive interpretation of multiple databases. Critical Care Time devoted to patient care services described in this note independent of APP/resident time (if applicable)  is 31 minutes.   Sherrilyn Rist MD [place] Pulmonary Critical Care Personal pager: See Amion If unanswered, please page CCM On-call: [phone]

## 2022-04-05 NOTE — Progress Notes (Addendum)
Patient arrived from [facility] ED with purse, laptop, headphones, tablet, cell phone, cell phone charger and pajamas. All belongings at bedside with patient.   Antonieta Pert, RN  04/04/2022 2200

## 2022-04-05 NOTE — Progress Notes (Signed)
Lower extremity venous bilateral study completed.   Please see CV Proc for preliminary results.   Airelle Everding, RDMS, RVT  

## 2022-04-05 NOTE — Progress Notes (Signed)
[facility] ADULT ICU REPLACEMENT PROTOCOL   The patient does apply for the [facility] Adult ICU Electrolyte Replacment Protocol based on the criteria listed below:   1.Exclusion criteria: TCTS patients, ECMO patients, and Dialysis patients 2. Is GFR >/= 30 ml/min? Yes.    Patient's GFR today is >60 3. Is SCr </= 2? Yes.   Patient's SCr is 0.64 mg/dL 4. Did SCr increase >/= 0.5 in 24 hours? No. 5.Pt's weight >40kg  Yes.   6. Abnormal electrolyte(s): K, Mag  7. Electrolytes replaced per protocol 8.  Call MD STAT for K+ </= 2.5, Phos </= 1, or Mag </= 1 Physician:  Luci Bank Sherline Eberwein 04/05/2022 2:02 AM

## 2022-04-05 NOTE — Progress Notes (Signed)
An USGPIV (ultrasound guided PIV) has been placed for short-term vasopressor infusion. A correctly placed ivWatch must be used when administering Vasopressors. Should this treatment be needed beyond 72 hours, central line access should be obtained.  It will be the responsibility of the bedside nurse to follow best practice to prevent extravasations.    Discussed POC with BS nurse regarding IV vasopressor infusion. Instructed to discuss with MD regarding changing PICC line out for a double lumen PICC line in order to accommodate the TPN and vasopressor. VU. Fran Lowes, RN VAST

## 2022-04-05 NOTE — Progress Notes (Signed)
  Echocardiogram 2D Echocardiogram has been performed.  Rebecca Day 04/05/2022, 12:52 PM

## 2022-04-05 NOTE — Progress Notes (Signed)
PHARMACY - PHYSICIAN COMMUNICATION CRITICAL VALUE ALERT - BLOOD CULTURE IDENTIFICATION (BCID)  Assessment:  36 y.o. female admitted on 04/04/2022 presenting with fever, concern for sepsis and pneumonia. 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 CXR showed scattered ground-glass opacities that are likely either infectious or inflammatory. Patient on TPN PTA.Tm 104.4, WBC WNL.   Patient is growing 1/4 bottles GPCs in clusters (anaerobic bottle) identified as Staphylococcus epidermidis on BCID, with no resistance mechanisms detected- Likely contaminant.   Name of physician (or Provider) Contacted: Collier Bullock, MD   Current antibiotics: Ceftriaxone 2g IV Q24H   Changes to prescribed antibiotics recommended: No changes recommended at this time  Patient is on recommended antibiotics - No changes needed  Results for orders placed or performed during the hospital encounter of 04/04/22  Blood Culture ID Panel (Reflexed) (Collected: 04/04/2022  3:18 PM)  Result Value Ref Range   Enterococcus faecalis NOT DETECTED NOT DETECTED   Enterococcus Faecium NOT DETECTED NOT DETECTED   Listeria monocytogenes NOT DETECTED NOT DETECTED   Staphylococcus species DETECTED (A) NOT DETECTED   Staphylococcus aureus (BCID) NOT DETECTED NOT DETECTED   Staphylococcus epidermidis DETECTED (A) NOT DETECTED   Staphylococcus lugdunensis NOT DETECTED NOT DETECTED   Streptococcus species NOT DETECTED NOT DETECTED   Streptococcus agalactiae NOT DETECTED NOT DETECTED   Streptococcus pneumoniae NOT DETECTED NOT DETECTED   Streptococcus pyogenes NOT DETECTED NOT DETECTED   A.calcoaceticus-baumannii NOT DETECTED NOT DETECTED   Bacteroides fragilis NOT DETECTED NOT DETECTED   Enterobacterales NOT DETECTED NOT DETECTED   Enterobacter cloacae complex NOT DETECTED NOT DETECTED   Escherichia coli NOT DETECTED NOT DETECTED   Klebsiella aerogenes  NOT DETECTED NOT DETECTED   Klebsiella oxytoca NOT DETECTED NOT DETECTED   Klebsiella pneumoniae NOT DETECTED NOT DETECTED   Proteus species NOT DETECTED NOT DETECTED   Salmonella species NOT DETECTED NOT DETECTED   Serratia marcescens NOT DETECTED NOT DETECTED   Haemophilus influenzae NOT DETECTED NOT DETECTED   Neisseria meningitidis NOT DETECTED NOT DETECTED   Pseudomonas aeruginosa NOT DETECTED NOT DETECTED   Stenotrophomonas maltophilia NOT DETECTED NOT DETECTED   Candida albicans NOT DETECTED NOT DETECTED   Candida auris NOT DETECTED NOT DETECTED   Candida glabrata NOT DETECTED NOT DETECTED   Candida krusei NOT DETECTED NOT DETECTED   Candida parapsilosis NOT DETECTED NOT DETECTED   Candida tropicalis NOT DETECTED NOT DETECTED   Cryptococcus neoformans/gattii NOT DETECTED NOT DETECTED   Methicillin resistance mecA/C NOT DETECTED NOT DETECTED    Adria Dill, PharmD PGY-2 Infectious Diseases Resident  04/05/2022 1:56 PM

## 2022-04-05 NOTE — Progress Notes (Signed)
Pharmacy Antibiotic Note  Rebecca Day is a 36 y.o. female admitted on 04/04/2022 presenting with fever, concern for sepsis and pneumonia. 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 CXR showed opacities that are likely either infectious or inflammatory. Patient on TPN PTA. Pharmacy has been consulted for ceftriaxone dosing.   Blood cultures show NGTD <24 hrs and negative MRSA nares. Patient has PICC PTA for TPN with no s/sx infection. She is afebrile with WBC 5.7 and has showed response to vancomycin and cefepime. No longer on levophed gtt. De-escalation appropriate.   Plan: Start ceftriaxone 2 g IV q24h Discontinue vancomycin and cefepime  Monitor renal function, culture results, and clinical status  Narrow abx as able and f/u with appropriate duration   Height: '5\' 8"'$  (172.7 cm) Weight: 127.7 kg (281 lb 8.4 oz) IBW/kg (Calculated) : 63.9  Temp (24hrs), Avg:99.7 F (37.6 C), Min:98 F (36.7 C), Max:104.4 F (40.2 C)  Recent Labs  Lab 04/04/22 1530 04/04/22 1553 04/04/22 1930 04/05/22 0115  WBC 6.5  --   --  5.7  CREATININE 0.94 0.80  --  0.64  LATICACIDVEN 1.7  --  0.9  --     Estimated Creatinine Clearance: 141.2 mL/min (by C-G formula based on SCr of 0.64 mg/dL).    Allergies  Allergen Reactions   Penicillins Hives    Has patient had a PCN reaction causing immediate rash, facial/tongue/throat swelling, SOB or lightheadedness with hypotension: Yes Has patient had a PCN reaction causing severe rash involving mucus membranes or skin necrosis: No Has patient had a PCN reaction that required hospitalization: Yes Has patient had a PCN reaction occurring within the last 10 years: NO If all of the above answers are "NO", then may proceed with Cephalosporin use.     Gena Fray, PharmD PGY1 Pharmacy Resident   04/05/2022 11:04 AM

## 2022-04-05 NOTE — Progress Notes (Signed)
Initial Nutrition Assessment  DOCUMENTATION CODES:   Not applicable  INTERVENTION:   TPN to meet 100% of estimated needs.  Continue Regular diet.   NUTRITION DIAGNOSIS:   Inadequate oral intake related to altered GI function as evidenced by meal completion < 50%.  GOAL:   Patient will meet greater than or equal to 90% of their needs  MONITOR:   I & O's, Labs, PO intake  REASON FOR ASSESSMENT:   Consult New TPN/TNA  ASSESSMENT:   36 yo female admitted with fever. 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   Discussed patient in ICU rounds and with RN today. Patient with septic shock, unclear source at this time. Patient has been receiving TPN at home d/t nausea and vomiting following gastric bypass surgery. Unable to speak with patient or complete NFPE at this time as she was receiving 2D echo at the time of RD visit. She is currently on a regular diet, but intake is minimal.   Labs reviewed.  CBG: [phone]  Medications reviewed and include Novolog, Levophed.   Weight history reviewed. Patient lost 18% of her usual weight from 4/5 (142.9 kg) to 7/13 (117.9 kg) after gastric sleeve surgery. She is currently 127.7 kg  NUTRITION - FOCUSED PHYSICAL EXAM:  Unable to complete at this time  Diet Order:   Diet Order             Diet regular Room service appropriate? Yes; Fluid consistency: Thin  Diet effective now                   EDUCATION NEEDS:   Not appropriate for education at this time  Skin:  Skin Assessment: Reviewed RN Assessment  Last BM:  no BM documented  Height:   Ht Readings from Last 1 Encounters:  04/04/22 '5\' 8"'$  (1.727 m)    Weight:   Wt Readings from Last 1 Encounters:  04/05/22 127.7 kg     BMI:  Body mass index is 42.81 kg/m.  Estimated Nutritional Needs:   Kcal:  2000-2200  Protein:  120-140 gm  Fluid:  2-2.2 L   Lucas Mallow RD, LDN,  CNSC Please refer to Amion for contact information.

## 2022-04-06 DIAGNOSIS — R579 Shock, unspecified: Secondary | ICD-10-CM | POA: Diagnosis not present

## 2022-04-06 LAB — COMPREHENSIVE METABOLIC PANEL
ALT: 45 U/L — ABNORMAL HIGH (ref 0–44)
AST: 32 U/L (ref 15–41)
Albumin: 2.5 g/dL — ABNORMAL LOW (ref 3.5–5.0)
Alkaline Phosphatase: 71 U/L (ref 38–126)
Anion gap: 8 (ref 5–15)
BUN: 7 mg/dL (ref 6–20)
CO2: 22 mmol/L (ref 22–32)
Calcium: 8.6 mg/dL — ABNORMAL LOW (ref 8.9–10.3)
Chloride: 110 mmol/L (ref 98–111)
Creatinine, Ser: 0.67 mg/dL (ref 0.44–1.00)
GFR, Estimated: >60 mL/min (ref >60)
Glucose, Bld: 122 mg/dL — ABNORMAL HIGH (ref 70–99)
Potassium: 4.1 mmol/L (ref 3.5–5.1)
Sodium: 140 mmol/L (ref 135–145)
Total Bilirubin: 0.3 mg/dL (ref 0.3–1.2)
Total Protein: 5.8 g/dL — ABNORMAL LOW (ref 6.5–8.1)

## 2022-04-06 LAB — TRIGLYCERIDES: Triglycerides: 55 mg/dL (ref <150)

## 2022-04-06 LAB — GLUCOSE, CAPILLARY
Glucose-Capillary: 103 mg/dL — ABNORMAL HIGH (ref 70–99)
Glucose-Capillary: 124 mg/dL — ABNORMAL HIGH (ref 70–99)
Glucose-Capillary: 133 mg/dL — ABNORMAL HIGH (ref 70–99)
Glucose-Capillary: 145 mg/dL — ABNORMAL HIGH (ref 70–99)
Glucose-Capillary: 88 mg/dL (ref 70–99)
Glucose-Capillary: 91 mg/dL (ref 70–99)

## 2022-04-06 LAB — PROCALCITONIN: Procalcitonin: 0.55 ng/mL

## 2022-04-06 LAB — MAGNESIUM: Magnesium: 1.8 mg/dL (ref 1.7–2.4)

## 2022-04-06 LAB — URINE CULTURE: Culture: NO GROWTH

## 2022-04-06 LAB — PHOSPHORUS: Phosphorus: 3.4 mg/dL (ref 2.5–4.6)

## 2022-04-06 MED ORDER — ALBUMIN HUMAN 5 % IV SOLN
12.5000 g | Freq: Once | INTRAVENOUS | Status: AC
Start: 2022-04-07 — End: 2022-04-07
  Administered 2022-04-06: 12.5 g via INTRAVENOUS
  Filled 2022-04-06: qty 250

## 2022-04-06 MED ORDER — NOREPINEPHRINE 4 MG/250ML-% IV SOLN
2.0000 ug/min | INTRAVENOUS | Status: DC
  Administered 2022-04-07: 2 ug/min via INTRAVENOUS

## 2022-04-06 MED ORDER — TRAVASOL 10 % IV SOLN
INTRAVENOUS | Status: AC
  Filled 2022-04-06: qty 1320

## 2022-04-06 MED ORDER — POTASSIUM CHLORIDE 10 MEQ/100ML IV SOLN
10.0000 meq | Freq: Once | INTRAVENOUS | Status: AC
  Administered 2022-04-06: 10 meq via INTRAVENOUS
  Filled 2022-04-06: qty 100

## 2022-04-06 MED ORDER — MIDODRINE HCL 5 MG PO TABS: 5.0000 mg | ORAL_TABLET | Freq: Three times a day (TID) | ORAL | Status: DC

## 2022-04-06 MED ORDER — SODIUM CHLORIDE 0.9 % IV SOLN: 250.0000 mL | INTRAVENOUS | Status: DC

## 2022-04-06 MED ORDER — METOCLOPRAMIDE HCL 5 MG/5ML PO SOLN
10.0000 mg | Freq: Three times a day (TID) | ORAL | Status: DC
  Administered 2022-04-06 – 2022-04-15 (×29): 10 mg via ORAL
  Filled 2022-04-06 (×30): qty 10

## 2022-04-06 MED ORDER — KETOROLAC TROMETHAMINE 15 MG/ML IJ SOLN
15.0000 mg | Freq: Once | INTRAMUSCULAR | Status: AC
  Administered 2022-04-06: 15 mg via INTRAVENOUS
  Filled 2022-04-06: qty 1

## 2022-04-06 MED ORDER — MIDODRINE HCL 5 MG PO TABS
10.0000 mg | ORAL_TABLET | Freq: Three times a day (TID) | ORAL | Status: DC
  Administered 2022-04-06: 10 mg via ORAL
  Filled 2022-04-06 (×2): qty 2

## 2022-04-06 MED ORDER — MAGNESIUM SULFATE 2 GM/50ML IV SOLN
2.0000 g | Freq: Once | INTRAVENOUS | Status: AC
Start: 2022-04-06 — End: 2022-04-06
  Administered 2022-04-06: 2 g via INTRAVENOUS
  Filled 2022-04-06: qty 50

## 2022-04-06 NOTE — Plan of Care (Signed)

## 2022-04-06 NOTE — Progress Notes (Addendum)
PHARMACY - TOTAL PARENTERAL NUTRITION CONSULT NOTE   Indication:  continuation of home therapy, patient is on TPN for nausea and vomiting following gastric bypass surgery   Patient Measurements: Height: '5\' 8"'$  (172.7 cm) Weight: 131.2 kg (289 lb 3.9 oz) IBW/kg (Calculated) : 63.9 TPN AdjBW (KG): 79.9 Body mass index is 43.98 kg/m. Usual Weight: 127kg   Assessment: Patient is continuing TPN therapy from home. All current electrolytes are within normal range. Home TPN order from [facility] reviewed. Macro-nutrients per home TPN are as follows:   Volume: 2300 mL, patient was utilizing a cyclic feed schedule over 12 hours  Dextrose: 230 grams  Amino Acids: 92 grams  Lipids: 46 grams  Sodium chloride: 81mq Sodium acetate: 936m Sodium phosphate: 1534mKcl: 60m44mMagnesium sulfate: 10mE13malcium glucose: 10mEq80mace element: 1mL/da76m Of note patient take does enteral nutrition for comfort. However does not meet nutritional requirements requiring TPN therapy.   Glucose / Insulin: Blood sugars within goal range ('180mg'$ /dL), has required 4 units SSI in the past 24 hours  Electrolytes: Within normal range  Renal: Renal function appropriate, CrCl 143.4mL/min43mepatic: Normal hepatic function  Intake / Output; MIVF:  no MIVF  GI Imaging:  8/6 CT abdomen pelvis: normal  GI Surgeries / Procedures:  Gastric bypass procedure in June 2023   Central access: One Lumen PICC line  TPN start date: 04/05/2022, inpatient   Nutritional Goals: Goal TPN rate is 100 mL/hr    RD Assessment: Kcal:  2000-2200 Protein:  120-140 gm Fluid:  2-2.2 L  Current Nutrition:  TPN and regular diet   Plan:  Continue TPN at 100mL/hr 61m800, meeting 100% of needs with total kcal content of 2188 and 132g of protein.  Consider changing patient to cyclic cycle after stabilization and off pressors.  Electrolytes in TPN: Na 50mEq/L, 33mmEq/L, C90mEq/L, Mg 25mq/L, and 26ms 15mmol/L. Cl:41m:1 Add  standard MVI and trace elements to TPN Continue Sensitive q4h SSI and adjust as needed  Monitor TPN labs on Mon/Thurs, daily CMP ordered for 8/8 to assess TPN   Arhum Peeples W WilsVentura Sellers5 PM

## 2022-04-06 NOTE — Progress Notes (Signed)
NAMEEliot Day, MRN:  [id number], DOB:  1986/08/19, LOS: 2 ADMISSION DATE:  04/04/2022, CONSULTATION DATE:  8/6 REFERRING MD:  Darl Householder, CHIEF COMPLAINT: Fever  History of Present Illness:   Rebecca Day, is a 36 y.o. female, who presented to the [facility] ED with a chief complaint of fever  They have a pertinent past medical history of gastric bypass surgery (01/28/2022, gastric sleeve), on TPN post procedure, lupus not currently on medications.  Prior to arrival, patient denies sick contacts.  Recently returned to work at office job.  Developed nausea and vomiting on Friday 8/4, developed generalized weakness, family having to help at home with TPN.  Patient developed a fever over 103 at home and EMS was called.  ED course was notable for temperature of 104.4 Fahrenheit, no leukocytosis, no lactic acid elevation.  CTA chest negative for PE, bilateral lower lobe suspicious for infection.  Patient presenting with PICC line for home TPN.  She became hypotensive with a blood pressure of 79/33.  Levophed was started.  She was given 4 L of IV fluid.  Blood cultures and urine cultures were collected.  She was started on vancomycin and cefepime.  Endorses headache.  Denies neck stiffness or pain, no pain on the elevation, no abdominal pain, no light sensitivity.  GCS 15.  On room air.  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  Blood cultures >, vancomycin/cefepime >, CTh negative, CT PE negative for PE, CT abdomen negative for obstruction 8/8  Interim History / Subjective:  Remains on pressors Awake and alert Still has nausea but better  Objective   Blood pressure 130/66, pulse 62, temperature 97.7 F (36.5  C), temperature source Axillary, resp. rate 19, height '5\' 8"'$  (1.727 m), weight 131.2 kg, SpO2 96 %.        Intake/Output Summary (Last 24 hours) at 04/06/2022 0814 Last data filed at 04/06/2022 0500 Gross per 24 hour  Intake 3192.24 ml  Output 1400 ml  Net 1792.24 ml   Filed Weights   04/04/22 2140 04/05/22 0500 04/06/22 0500  Weight: 127.7 kg 127.7 kg 131.2 kg    Examination: General: Middle-age, does not appear to be in distress  HEENT: Moist oral mucosa Neuro: No focal findings CV: S1-S2 appreciated with no murmur PULM: Clear breath sounds bilaterally GI: Bowel sounds appreciated Extremities: Skin is warm and dry, no edema Skin: PICC site-no erythema, nontender     Labs/imaging Blood culture -1 out of 2 bottles with Staph epidermidis, doubt of significant consequence CBC reviewed-no leukocytosis CO2 21 ALT 54 Lactic 1.7 UA negative nitrate, negative leukocytes, no bacteria seen Respiratory culture pending-no samples SARS negative  CT head: No acute intracranial abnormality CT abdomen pelvis with contrast: Bladder mildly distended with upstream hydronephrosis.  Likely urinary retention per radiology.  No evidence of bowel obstruction. CTA PE: Negative for PE, bilateral groundglass opacities, question pneumonia Twelve-lead: Sinus tach, no significant ST changes noted  Resolved Hospital Problem list     Assessment & Plan:   Septic shock -No identifiable source -No focality noted -Still requiring Levophed, will wean -About 6 L positive -No growth on cultures -Will discontinue antibiotics after today's dose  Headache -No neurological focality -CT head negative -Chronic problems   Headache -  CT head negative -No neurological focality  Fever -May be related to viral syndrome -Continue to monitor -Discontinue Rocephin after today's dose  History of gastric bypass surgery 01/28/2022, gastric sleeve -On TPN  History of lupus -Was not on any medications  prior to admission  Start on midodrine Start on Reglan for nausea  If able to come off intravenous pressors May transfer to medical floor in a.m.  Best Practice (right click and "Reselect all SmartList Selections" daily)   Diet/type: Regular consistency (see orders) DVT prophylaxis: LMWH GI prophylaxis: N/A: Lines: N/A Foley:  N/A Code Status:  full code Last date of multidisciplinary goals of care discussion [Full scope]  Labs   CBC: Recent Labs  Lab 04/04/22 1530 04/04/22 1553 04/05/22 0115  WBC 6.5  --  5.7  NEUTROABS 5.2  --   --   HGB 13.3 13.9 10.7*  HCT 39.3 41.0 32.5*  MCV 88.1  --  90.5  PLT 223  --  527    Basic Metabolic Panel: Recent Labs  Lab 04/04/22 1530 04/04/22 1553 04/05/22 0115  NA 135 136 139  K 4.0 3.9 3.6  CL 105 103 110  CO2 21*  --  20*  GLUCOSE 131* 124* 93  BUN '10 10 8  '$ CREATININE 0.94 0.80 0.64  CALCIUM 9.1  --  8.1*  MG  --   --  1.7  PHOS  --   --  3.9   GFR: Estimated Creatinine Clearance: 143.4 mL/min (by C-G formula based on SCr of 0.64 mg/dL). Recent Labs  Lab 04/04/22 1530 04/04/22 1930 04/05/22 0115  PROCALCITON 0.66  --  1.28  WBC 6.5  --  5.7  LATICACIDVEN 1.7 0.9  --     Liver Function Tests: Recent Labs  Lab 04/04/22 1530  AST 37  ALT 54*  ALKPHOS 92  BILITOT 0.9  PROT 6.9  ALBUMIN 3.2*   No results for input(s): "LIPASE", "AMYLASE" in the last 168 hours. No results for input(s): "AMMONIA" in the last 168 hours.  ABG    Component Value Date/Time   TCO2 20 (L) 04/04/2022 1553     Coagulation Profile: No results for input(s): "INR", "PROTIME" in the last 168 hours.  Cardiac Enzymes: No results for input(s): "CKTOTAL", "CKMB", "CKMBINDEX", "TROPONINI" in the last 168 hours.  HbA1C: Hgb A1c MFr Bld  Date/Time Value Ref Range Status  10/05/2021 11:20 AM 5.7 (H) 4.8 - 5.6 % Final    Comment:             Prediabetes: 5.7 - 6.4          Diabetes: >6.4          Glycemic control for adults with  diabetes: <7.0   07/28/2021 09:01 AM 5.9 (H) 4.8 - 5.6 % Final    Comment:             Prediabetes: 5.7 - 6.4          Diabetes: >6.4          Glycemic control for adults with diabetes: <7.0     CBG: Recent Labs  Lab 04/05/22 1528 04/05/22 2000 04/06/22 0000 04/06/22 0351 04/06/22 0742  GLUCAP 103* 157* 145* 124* 133*    Review of Systems:   Positives in bold  Gen: fever, chills, weight change, fatigue, night sweats HEENT:  blurred vision, double vision, hearing loss, tinnitus, sinus congestion, rhinorrhea, sore throat, neck stiffness, dysphagia PULM:  shortness of breath, cough, sputum production, hemoptysis, wheezing CV:  chest pain, edema, orthopnea, paroxysmal nocturnal dyspnea, palpitations GI:  abdominal pain, nausea, vomiting, diarrhea, hematochezia, melena, constipation, change in bowel habits GU: dysuria, hematuria, polyuria, oliguria, urethral discharge Endocrine: hot or cold intolerance, polyuria, polyphagia or appetite change Derm: rash, dry skin, scaling or peeling skin change Heme: easy bruising, bleeding, bleeding gums Neuro: headache, numbness, weakness, slurred speech, loss of memory or consciousness   Past Medical History:  She,  has a past medical history of Allergy, Anxiety, Asthma, Blood transfusion without reported diagnosis, Clotting disorder ([place]), Elevated blood sugar, Kidney stones, Lupus ([place]), 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[place]), On total parenteral nutrition (TPN), Ovarian cyst, and PMDD (premenstrual dysphoric disorder).   Surgical History:   Past Surgical History:  Procedure Laterality Date   CESAREAN SECTION     C/S x 2   LAPAROSCOPIC GASTRIC SLEEVE RESECTION N/A 01/28/2022   PILONIDAL CYST DRAINAGE       Social History:   reports that she has never smoked. She has never used smokeless tobacco. She reports current alcohol use. She reports that she does not use drugs.   Family History:  Her family history includes Cancer in her brother and father;  Colon cancer in her father, maternal aunt, and another family member; Colon polyps in her mother; Hyperlipidemia in her maternal grandmother; Hypertension in her mother; Kidney disease in her brother; Liver disease in her brother. There is no history of Esophageal cancer, Rectal cancer, Stomach cancer, or Breast cancer.   Allergies Allergies  Allergen Reactions   Penicillins Hives    Has patient had a PCN reaction causing immediate rash, facial/tongue/throat swelling, SOB or lightheadedness with hypotension: Yes Has patient had a PCN reaction causing severe rash involving mucus membranes or skin necrosis: No Has patient had a PCN reaction that required hospitalization: Yes Has patient had a PCN reaction occurring within the last 10 years: NO If all of the above answers are "NO", then may proceed with Cephalosporin use.     Home Medications  Prior to Admission medications   Medication Sig Start Date End Date Taking? Authorizing Provider  famotidine (PEPCID) 20 MG tablet TAKE 1 TABLET BY MOUTH EVERYDAY AT BEDTIME 01/20/22   Willia Craze, NP  gabapentin (NEURONTIN) 300 MG capsule Take 1 capsule (300 mg total) by mouth See admin instructions. 1 cap in AM and 2 at night. 10/01/21   Truett Mainland, DO  lidocaine (LIDODERM) 5 % Place 1 patch onto the skin daily. Remove & Discard patch within 12 hours or as directed by MD 12/04/21   Malvin Johns, MD  megestrol (MEGACE) 40 MG tablet Take 40 mg by mouth daily. Patient is taking 40 mg twice a day.    [provider]  metFORMIN (GLUCOPHAGE) 500 MG tablet Take 1 tablet (500 mg total) by mouth 2 (two) times daily with a meal. Patient not taking: Reported on 04/02/2022 10/05/21 10/05/22  [name], FNP  ondansetron (ZOFRAN-ODT) 8 MG disintegrating tablet Take 1 tablet (8 mg total) by mouth every 8 (eight) hours as needed for nausea or vomiting. 2/72/53   Delora Fuel, MD  pantoprazole (PROTONIX) 40 MG tablet Take 1 tablet (40 mg total) by mouth 2  (two) times daily for 14 days. 09/24/21 10/08/21  Thornton Park, MD  predniSONE (DELTASONE) 50 MG tablet Take 1 tablet (50 mg total) by mouth daily. Patient not taking: Reported on 02/02/4402 4/74/25   Delora Fuel, MD  sucralfate (CARAFATE) 1 g tablet Take 2 tablets (2 g total) by mouth 4 (four) times  daily -  with meals and at bedtime. 09/17/21   Willia Craze, NP  traMADol (ULTRAM) 50 MG tablet Take 1 tablet (50 mg total) by mouth every 6 (six) hours as needed. 12/04/21   Malvin Johns, MD    The patient is critically ill with multiple organ systems failure and requires high complexity decision making for assessment and support, frequent evaluation and titration of therapies, application of advanced monitoring technologies and extensive interpretation of multiple databases. Critical Care Time devoted to patient care services described in this note independent of APP/resident time (if applicable)  is 31 minutes.   Sherrilyn Rist MD [place] [facility] Personal pager: See Amion If unanswered, please page CCM On-call: [phone]

## 2022-04-07 DIAGNOSIS — R579 Shock, unspecified: Secondary | ICD-10-CM | POA: Diagnosis not present

## 2022-04-07 LAB — GLUCOSE, CAPILLARY
Glucose-Capillary: 102 mg/dL — ABNORMAL HIGH (ref 70–99)
Glucose-Capillary: 115 mg/dL — ABNORMAL HIGH (ref 70–99)
Glucose-Capillary: 89 mg/dL (ref 70–99)
Glucose-Capillary: 93 mg/dL (ref 70–99)
Glucose-Capillary: 98 mg/dL (ref 70–99)

## 2022-04-07 LAB — CULTURE, BLOOD (ROUTINE X 2)

## 2022-04-07 LAB — LACTIC ACID, PLASMA: Lactic Acid, Venous: 0.9 mmol/L (ref 0.5–1.9)

## 2022-04-07 MED ORDER — INSULIN ASPART 100 UNIT/ML IJ SOLN: 0.0000 [IU] | Freq: Three times a day (TID) | INTRAMUSCULAR | Status: DC

## 2022-04-07 MED ORDER — MIDODRINE HCL 5 MG PO TABS
10.0000 mg | ORAL_TABLET | Freq: Three times a day (TID) | ORAL | Status: DC
Start: 2022-04-07 — End: 2022-04-15
  Administered 2022-04-07 – 2022-04-15 (×26): 10 mg via ORAL
  Filled 2022-04-07 (×26): qty 2

## 2022-04-07 MED ORDER — TRAVASOL 10 % IV SOLN
INTRAVENOUS | Status: AC
  Filled 2022-04-07: qty 1320

## 2022-04-07 NOTE — TOC Initial Note (Signed)
Transition of Care [facility]) - Initial/Assessment Note    Patient Details  Name: Rebecca Day MRN: [id number] Date of Birth: 1986-04-17  Transition of Care [facility]) CM/SW Contact:    Tom-Johnson, Renea Ee, RN Phone Number: 04/07/2022, 5:34 PM  Clinical Narrative:                  CM spoke with patient at bedside about needs for post hospital transition. Admitted for Shock. Has Hx of Lupus. Had a recent Gastric Bypass Surgery on 01/28/22. Patient states she started developing nausea and vomiting. On TPN at home post surgery, gets supplies from [facility]. Has a PICC line, states she has an Therapist, sports from [facility] that does line care. Requests to change RN as she questions her Sterile technique. CM called and left a secure message for someone to return call from [facility].  Patient states she lives at home with her two children. Has thirteen siblings and mother is supportive of care. Currently employed at [employer].  PCP is [name], FNP [facility] [address]	[place].  Sister to transport at discharge. No TOC recommendations noted at this time. CM will continue to follow with needs as patient progresses with care.    Barriers to Discharge: Continued Medical Work up   Patient Goals and CMS Choice Patient states their goals for this hospitalization and ongoing recovery are:: To return home CMS Medicare.gov Compare Post Acute Care list provided to:: Patient    Expected Discharge Plan and Services   In-house Referral: Chaplain Discharge Planning Services: CM Consult   Living arrangements for the past 2 months: Apartment                                      Prior Living Arrangements/Services Living arrangements for the past 2 months: Apartment Lives with:: Minor Children Patient language and need for interpreter reviewed:: Yes Do you feel safe going back to the place where you live?: Yes          Current home services: DME (TPN  pump.) Criminal Activity/Legal Involvement Pertinent to Current Situation/Hospitalization: No - Comment as needed  Activities of Daily Living      Permission Sought/Granted Permission sought to share information with : Case Manager, Family Supports Permission granted to share information with : Yes, Verbal Permission Granted              Emotional Assessment Appearance:: Appears stated age Attitude/Demeanor/Rapport: Engaged, Gracious Affect (typically observed): Accepting, Appropriate, Calm, Hopeful, Pleasant Orientation: : Oriented to Self, Oriented to Place, Oriented to  Time, Oriented to Situation Alcohol / Substance Use: Not Applicable Psych Involvement: No (comment)  Admission diagnosis:  Shock ([place]) [R57.9] Septic shock ([place]) [A41.9, R65.21] Patient Active Problem List   Diagnosis Date Noted   Shock ([place]) 04/04/2022   Fever    Dyspepsia 09/17/2021   Insulin resistance 05/05/2020   Class 3 severe obesity due to excess calories without serious comorbidity with body mass index (BMI) of 45.0 to 49.9 in adult ([place]) 05/05/2020   Left lower quadrant abdominal pain 12/21/2018   Nausea 12/21/2018   History of blood clots 12/05/2018   Muscle ache 12/05/2018   Abnormal CT of liver 12/05/2018   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

## 2022-04-07 NOTE — Progress Notes (Signed)
Nutrition Follow-up  DOCUMENTATION CODES:   Not applicable  INTERVENTION:   TPN to meet 100% of estimated needs. Continue MVI in TPN. When able to tolerate POs without vomiting, add PO vitamins: 2 chewable MVI and 3 chewable calcium carbonate tablets (total 1200-1500 mg calcium) per day.  Continue carbohydrate modified diet.   Snacks TID between meals.  Ensure Max po TID, each supplement provides 150 kcal and 30 grams of protein.   NUTRITION DIAGNOSIS:   Inadequate oral intake related to altered GI function as evidenced by meal completion < 50%.  Ongoing.  GOAL:   Patient will meet greater than or equal to 90% of their needs  Met with TPN.  MONITOR:   I & O's, Labs, PO intake  REASON FOR ASSESSMENT:   Consult New TPN/TNA  ASSESSMENT:   36 yo female admitted with fever. 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   Discussed patient in ICU rounds and with RN today. Patient reports that she was doing well for the first 3 weeks after surgery, but then was unable to take any POs and had multiple episodes of low blood sugar and passing out. She has been on TPN for a few weeks at home. She was injecting a MVI in her PICC with TPN nightly at home. Diet advanced to carbohydrate modified 8/8. Patient is tolerating small amounts PO. Reglan is helping with her nausea. She agreed to receive snacks between meals and protein supplement (Ensure Max Protein) between meals. Discussed with patient vitamin/mineral recommendations for post gastric sleeve bypass. She has a list of supplements she needs to be taking at home and plans to resume them at home when she is tolerating POs. She is followed by a dietitian outpatient.   Labs reviewed. Corrected calcium 9.8 WNL CBG: [id number]  Medications reviewed and include Novolog, Reglan, midodrine.   NUTRITION - FOCUSED PHYSICAL EXAM:  Unable to complete at this  time  Diet Order:   Diet Order             Diet Carb Modified Fluid consistency: Thin; Room service appropriate? Yes  Diet effective now                   EDUCATION NEEDS:   Not appropriate for education at this time  Skin:  Skin Assessment: Reviewed RN Assessment  Last BM:  8/8 type 6  Height:   Ht Readings from Last 1 Encounters:  04/04/22 '5\' 8"'  (1.727 m)    Weight:   Wt Readings from Last 1 Encounters:  04/07/22 127.9 kg     BMI:  Body mass index is 42.87 kg/m.  Estimated Nutritional Needs:   Kcal:  2000-2200  Protein:  120-140 gm  Fluid:  2-2.2 L   Lucas Mallow RD, LDN, CNSC Please refer to Amion for contact information.

## 2022-04-07 NOTE — Progress Notes (Addendum)
PHARMACY - TOTAL PARENTERAL NUTRITION CONSULT NOTE  Indication:  Nausea/vomiting post gastric bypass surgery  Patient Measurements: Height: '5\' 8"'$  (172.7 cm) Weight: 127.9 kg (281 lb 15.5 oz) IBW/kg (Calculated) : 63.9 TPN AdjBW (KG): 79.9 Body mass index is 42.87 kg/m. Usual Weight: 127kg   Assessment:  29 YOF with history of gastric sleeve bypass in June of 2023 and presented with fever, nausea and vomiting.  Patient has been on TPN and a regular diet with minimal intake.  Pharmacy consulted to continue TPN from PTA.  Glucose / Insulin: no hx DM - CBGs < 180.  Used 3 units SSI in the past 24 hrs Electrolytes: 8/8 labs - all WNL (Mag 1.8 and received 2gm, K 4.1 and received 1 run) Renal: SCr < 1, BUN WNL Hepatic: all WNL except mildly elevated ALT at 45, tbili / TG WNL Intake / Output; MIVF:  UOP 0.4 ml/kg/hr, LBM 8/8 GI Imaging:  8/6 CT - normal  GI Surgeries / Procedures:  01/2022 - gastric bypass   Central access: single lumen PICC place 03/12/22 PTA TPN start date: con't from home (started 04/05/2022, inpatient)  Nutritional Goals:  RD Assessment: 2000-2200 kCal, 120-140g protein, 2-2.2L per day  Home TPN per [facility]: 2368m cycled over 12 hrs:  230g CHO, 92g AA, 46g ILE  Sodium chloride: 424m Sodium acetate: 9574mSodium phosphate: 57m26mCL: 70mE34mag sulfate: 10mEq42mlcium gluc: 10mEq 35mrrent Nutrition:  TPN and regular diet   Plan:  Continue TPN at 100mL/hr41mprovide 132g AA and 2189 kCal, meeting 100% of needs Electrolytes in TPN: Na 50mEq/L,53m0mEq/L, 55mmEq/L, in85mase Mg 6mEq/L on 852m Phos 57mmol/L. Cl40m1:1 Add standard MVI and trace elements to TPN Reduce sensitive SSI to TIDAC [facility] on Mon/Thurs F/U transitioning to cyclic TPN once out of the ICU, PO intake/weight  Monitor BP on midodrine  Madgie Dhaliwal D. Faiga Stones, PharmD,[facility] [place]S, BCCCP 8/9/202[place]31 AM

## 2022-04-07 NOTE — Progress Notes (Addendum)
NAMEShenica Day, MRN:  [id number], DOB:  1986/06/29, LOS: 3 ADMISSION DATE:  04/04/2022, CONSULTATION DATE:  8/6 REFERRING MD:  Darl Householder, CHIEF COMPLAINT: Fever  History of Present Illness:   Rebecca Day, is a 36 y.o. female, who presented to the [facility] ED with a chief complaint of fever  They have a pertinent past medical history of gastric bypass surgery (01/28/2022, gastric sleeve), on TPN post procedure, lupus not currently on medications.  Prior to arrival, patient denies sick contacts.  Recently returned to work at office job.  Developed nausea and vomiting on Friday 8/4, developed generalized weakness, family having to help at home with TPN.  Patient developed a fever over 103 at home and EMS was called.  ED course was notable for temperature of 104.4 Fahrenheit, no leukocytosis, no lactic acid elevation.  CTA chest negative for PE, bilateral lower lobe suspicious for infection.  Patient presenting with PICC line for home TPN.  Rebecca Day became hypotensive with a blood pressure of 79/33.  Levophed was started.  Rebecca Day was given 4 L of IV fluid.  Blood cultures and urine cultures were collected.  Rebecca Day was started on vancomycin and cefepime.  Endorses headache.  Denies neck stiffness or pain, no pain on the elevation, no abdominal pain, no light sensitivity.  GCS 15.  On room air.  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  Blood cultures >, vancomycin/cefepime >, CTh negative, CT PE negative for PE, CT abdomen negative for obstruction 8/8 had to go back on Levophed later on in the day 8/9 off Levophed since 2 AM Interim History / Subjective:  Off pressors this morning Awake and alert Still has nausea but  better Headache is stable Soft stools, tolerating some food  Objective   Blood pressure 131/66, pulse 61, temperature 98.1 F (36.7 C), temperature source Oral, resp. rate (!) 22, height '5\' 8"'$  (1.727 m), weight 127.9 kg, SpO2 97 %.        Intake/Output Summary (Last 24 hours) at 04/07/2022 0848 Last data filed at 04/07/2022 0600 Gross per 24 hour  Intake 2779.4 ml  Output 1150 ml  Net 1629.4 ml   Filed Weights   04/05/22 0500 04/06/22 0500 04/07/22 0500  Weight: 127.7 kg 131.2 kg 127.9 kg    Examination: General: Middle-age, not in distress  HEENT: S1-S2 appreciated with no murmur Neuro: No focal findings CV: S1-S2 appreciated with no murmur PULM: Clear breath sounds bilaterally GI: Bowel sounds appreciated Extremities: Skin is warm and dry, no edema Skin: PICC site-no erythema, nontender     Labs/imaging Blood culture -1 out of 2 bottles with Staph epidermidis, doubt of significant consequence CBC reviewed-no leukocytosis No lactate elevation UA negative nitrate, negative leukocytes, no bacteria seen Respiratory culture pending-no samples SARS negative  CT head: No acute intracranial abnormality CT abdomen pelvis with contrast: Bladder mildly distended with upstream hydronephrosis.  Likely urinary retention per radiology.  No evidence of bowel obstruction. CTA PE: Negative for PE, bilateral groundglass opacities, question pneumonia Twelve-lead: Sinus tach, no significant ST changes noted  Resolved Hospital Problem list     Assessment & Plan:   Septic shock -Still requiring IV pressors intermittently -No identified source of infection -No growth on cultures -1 out of  2 bottles did reveal staff epidermidis, felt to be clinically insignificant -Off antibiotics  Headache -Chronic headaches -CT head negative  Fever -This may have been related to viral syndrome -Off antibiotics for over 24 hours with no new fevers  History of gastric bypass surgery 01/28/2022,  gastric sleeve -Remains on TPN -Attempting to take in orally  History of lupus -Was not on any medications prior to hospitalization  Continue midodrine -Continue Reglan for nausea  Will try to transfer to medical floor today Follow a.m. labs  Best Practice (right click and "Reselect all SmartList Selections" daily)   Diet/type: Regular consistency (see orders) DVT prophylaxis: LMWH GI prophylaxis: N/A: Lines: N/A Foley:  N/A Code Status:  full code Last date of multidisciplinary goals of care discussion [Full scope]  Labs   CBC: Recent Labs  Lab 04/04/22 1530 04/04/22 1553 04/05/22 0115  WBC 6.5  --  5.7  NEUTROABS 5.2  --   --   HGB 13.3 13.9 10.7*  HCT 39.3 41.0 32.5*  MCV 88.1  --  90.5  PLT 223  --  025    Basic Metabolic Panel: Recent Labs  Lab 04/04/22 1530 04/04/22 1553 04/05/22 0115 04/06/22 0826  NA 135 136 139 140  K 4.0 3.9 3.6 4.1  CL 105 103 110 110  CO2 21*  --  20* 22  GLUCOSE 131* 124* 93 122*  BUN '10 10 8 7  '$ CREATININE 0.94 0.80 0.64 0.67  CALCIUM 9.1  --  8.1* 8.6*  MG  --   --  1.7 1.8  PHOS  --   --  3.9 3.4   GFR: Estimated Creatinine Clearance: 141.3 mL/min (by C-G formula based on SCr of 0.67 mg/dL). Recent Labs  Lab 04/04/22 1530 04/04/22 1930 04/05/22 0115 04/06/22 0826 04/07/22 0116  PROCALCITON 0.66  --  1.28 0.55  --   WBC 6.5  --  5.7  --   --   LATICACIDVEN 1.7 0.9  --   --  0.9    Liver Function Tests: Recent Labs  Lab 04/04/22 1530 04/06/22 0826  AST 37 32  ALT 54* 45*  ALKPHOS 92 71  BILITOT 0.9 0.3  PROT 6.9 5.8*  ALBUMIN 3.2* 2.5*   No results for input(s): "LIPASE", "AMYLASE" in the last 168 hours. No results for input(s): "AMMONIA" in the last 168 hours.  ABG    Component Value Date/Time   TCO2 20 (L) 04/04/2022 1553     Coagulation Profile: No results for input(s): "INR", "PROTIME" in the last 168 hours.  Cardiac Enzymes: No results for input(s): "CKTOTAL", "CKMB", "CKMBINDEX",  "TROPONINI" in the last 168 hours.  HbA1C: Hgb A1c MFr Bld  Date/Time Value Ref Range Status  10/05/2021 11:20 AM 5.7 (H) 4.8 - 5.6 % Final    Comment:             Prediabetes: 5.7 - 6.4          Diabetes: >6.4          Glycemic control for adults with diabetes: <7.0   07/28/2021 09:01 AM 5.9 (H) 4.8 - 5.6 % Final    Comment:             Prediabetes: 5.7 - 6.4          Diabetes: >6.4          Glycemic control for adults with diabetes: <7.0     CBG: Recent Labs  Lab 04/06/22 1517 04/06/22 1926 04/07/22 0002 04/07/22 8527  04/07/22 0732  GLUCAP 88 91 102* 93 115*    Review of Systems:   Positives in bold  Gen: fever, chills, weight change, fatigue, night sweats HEENT:  blurred vision, double vision, hearing loss, tinnitus, sinus congestion, rhinorrhea, sore throat, neck stiffness, dysphagia PULM:  shortness of breath, cough, sputum production, hemoptysis, wheezing CV: chest pain, edema, orthopnea, paroxysmal nocturnal dyspnea, palpitations GI:  abdominal pain, nausea, vomiting, diarrhea, hematochezia, melena, constipation, change in bowel habits GU: dysuria, hematuria, polyuria, oliguria, urethral discharge Endocrine: hot or cold intolerance, polyuria, polyphagia or appetite change Derm: rash, dry skin, scaling or peeling skin change Heme: easy bruising, bleeding, bleeding gums Neuro: headache, numbness, weakness, slurred speech, loss of memory or consciousness   Past Medical History:  Rebecca Day,  has a past medical history of Allergy, Anxiety, Asthma, Blood transfusion without reported diagnosis, Clotting disorder ([place]), Elevated blood sugar, Kidney stones, Lupus ([place]), 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[place]), On total parenteral nutrition (TPN), Ovarian cyst, and PMDD (premenstrual dysphoric disorder).   Surgical History:   Past Surgical History:  Procedure Laterality Date   CESAREAN SECTION     C/S x 2   LAPAROSCOPIC GASTRIC SLEEVE RESECTION N/A 01/28/2022   PILONIDAL CYST DRAINAGE        Social History:   reports that Rebecca Day has never smoked. Rebecca Day has never used smokeless tobacco. Rebecca Day reports current alcohol use. Rebecca Day reports that Rebecca Day does not use drugs.   Family History:  Rebecca Day family history includes Cancer in Rebecca Day brother and father; Colon cancer in Rebecca Day father, maternal aunt, and another family member; Colon polyps in Rebecca Day mother; Hyperlipidemia in Rebecca Day maternal grandmother; Hypertension in Rebecca Day mother; Kidney disease in Rebecca Day brother; Liver disease in Rebecca Day brother. There is no history of Esophageal cancer, Rectal cancer, Stomach cancer, or Breast cancer.   Allergies Allergies  Allergen Reactions   Penicillins Hives    Has patient had a PCN reaction causing immediate rash, facial/tongue/throat swelling, SOB or lightheadedness with hypotension: Yes Has patient had a PCN reaction causing severe rash involving mucus membranes or skin necrosis: No Has patient had a PCN reaction that required hospitalization: Yes Has patient had a PCN reaction occurring within the last 10 years: NO If all of the above answers are "NO", then may proceed with Cephalosporin use.     Home Medications  Prior to Admission medications   Medication Sig Start Date End Date Taking? Authorizing Provider  famotidine (PEPCID) 20 MG tablet TAKE 1 TABLET BY MOUTH EVERYDAY AT BEDTIME 01/20/22   Willia Craze, NP  gabapentin (NEURONTIN) 300 MG capsule Take 1 capsule (300 mg total) by mouth See admin instructions. 1 cap in AM and 2 at night. 10/01/21   Truett Mainland, DO  lidocaine (LIDODERM) 5 % Place 1 patch onto the skin daily. Remove & Discard patch within 12 hours or as directed by MD 12/04/21   Malvin Johns, MD  megestrol (MEGACE) 40 MG tablet Take 40 mg by mouth daily. Patient is taking 40 mg twice a day.    [provider]  metFORMIN (GLUCOPHAGE) 500 MG tablet Take 1 tablet (500 mg total) by mouth 2 (two) times daily with a meal. Patient not taking: Reported on 04/02/2022 10/05/21 10/05/22  [name], FNP  ondansetron (ZOFRAN-ODT) 8 MG disintegrating tablet Take 1 tablet (8 mg total) by mouth every 8 (eight) hours as needed for nausea or vomiting. 02/17/29   Delora Fuel, MD  pantoprazole (PROTONIX) 40 MG tablet Take 1 tablet (40 mg total) by mouth  2 (two) times daily for 14 days. 09/24/21 10/08/21  Thornton Park, MD  predniSONE (DELTASONE) 50 MG tablet Take 1 tablet (50 mg total) by mouth daily. Patient not taking: Reported on 04/06/6772 7/36/68   Delora Fuel, MD  sucralfate (CARAFATE) 1 g tablet Take 2 tablets (2 g total) by mouth 4 (four) times daily -  with meals and at bedtime. 09/17/21   Willia Craze, NP  traMADol (ULTRAM) 50 MG tablet Take 1 tablet (50 mg total) by mouth every 6 (six) hours as needed. 12/04/21   Malvin Johns, MD    Sherrilyn Rist, MD [place] [facility] Pager: See Shea Evans

## 2022-04-07 NOTE — Progress Notes (Addendum)
eLink Physician-Brief Progress Note Patient Name: Rebecca Day DOB: 02/12/1986 MRN: [id number]   Date of Service  04/07/2022  HPI/Events of Note  Patient with hypotension.  eICU Interventions  Albumin 5 % 250 ml iv bolus x 1 infusing, Norepinephrine gtt ordered and being set up. Will monitor BP response and give another 250 ml of Albumin if MAP remains < 65 mmHg. Will check lactic acid.        Kerry Kass Juliane Guest 04/07/2022, 12:48 AM

## 2022-04-08 ENCOUNTER — Inpatient Hospital Stay (HOSPITAL_COMMUNITY): Payer: No Typology Code available for payment source

## 2022-04-08 DIAGNOSIS — R748 Abnormal levels of other serum enzymes: Secondary | ICD-10-CM

## 2022-04-08 DIAGNOSIS — R519 Headache, unspecified: Secondary | ICD-10-CM

## 2022-04-08 DIAGNOSIS — B349 Viral infection, unspecified: Secondary | ICD-10-CM

## 2022-04-08 DIAGNOSIS — R579 Shock, unspecified: Secondary | ICD-10-CM | POA: Diagnosis not present

## 2022-04-08 DIAGNOSIS — Z9884 Bariatric surgery status: Secondary | ICD-10-CM

## 2022-04-08 LAB — COMPREHENSIVE METABOLIC PANEL
ALT: 125 U/L — ABNORMAL HIGH (ref 0–44)
AST: 129 U/L — ABNORMAL HIGH (ref 15–41)
Albumin: 2.7 g/dL — ABNORMAL LOW (ref 3.5–5.0)
Alkaline Phosphatase: 74 U/L (ref 38–126)
Anion gap: 5 (ref 5–15)
BUN: 9 mg/dL (ref 6–20)
CO2: 23 mmol/L (ref 22–32)
Calcium: 9 mg/dL (ref 8.9–10.3)
Chloride: 107 mmol/L (ref 98–111)
Creatinine, Ser: 0.68 mg/dL (ref 0.44–1.00)
GFR, Estimated: >60 mL/min (ref >60)
Glucose, Bld: 99 mg/dL (ref 70–99)
Potassium: 4.3 mmol/L (ref 3.5–5.1)
Sodium: 135 mmol/L (ref 135–145)
Total Bilirubin: 0.2 mg/dL — ABNORMAL LOW (ref 0.3–1.2)
Total Protein: 5.9 g/dL — ABNORMAL LOW (ref 6.5–8.1)

## 2022-04-08 LAB — CBC WITH DIFFERENTIAL/PLATELET
Abs Immature Granulocytes: 0.06 10*3/uL (ref 0.00–0.07)
Basophils Absolute: 0 10*3/uL (ref 0.0–0.1)
Basophils Relative: 1 %
Eosinophils Absolute: 0.1 10*3/uL (ref 0.0–0.5)
Eosinophils Relative: 2 %
HCT: 34.3 % — ABNORMAL LOW (ref 36.0–46.0)
Hemoglobin: 11.4 g/dL — ABNORMAL LOW (ref 12.0–15.0)
Immature Granulocytes: 1 %
Lymphocytes Relative: 41 %
Lymphs Abs: 2.6 10*3/uL (ref 0.7–4.0)
MCH: 30.2 pg (ref 26.0–34.0)
MCHC: 33.2 g/dL (ref 30.0–36.0)
MCV: 90.7 fL (ref 80.0–100.0)
Monocytes Absolute: 0.5 10*3/uL (ref 0.1–1.0)
Monocytes Relative: 8 %
Neutro Abs: 3.1 10*3/uL (ref 1.7–7.7)
Neutrophils Relative %: 47 %
Platelets: 233 10*3/uL (ref 150–400)
RBC: 3.78 MIL/uL — ABNORMAL LOW (ref 3.87–5.11)
RDW: 13.7 % (ref 11.5–15.5)
WBC: 6.4 10*3/uL (ref 4.0–10.5)
nRBC: 0 % (ref 0.0–0.2)

## 2022-04-08 LAB — TRIGLYCERIDES: Triglycerides: 89 mg/dL (ref <150)

## 2022-04-08 LAB — PHOSPHORUS: Phosphorus: 4.5 mg/dL (ref 2.5–4.6)

## 2022-04-08 LAB — MAGNESIUM: Magnesium: 1.8 mg/dL (ref 1.7–2.4)

## 2022-04-08 LAB — GLUCOSE, CAPILLARY
Glucose-Capillary: 115 mg/dL — ABNORMAL HIGH (ref 70–99)
Glucose-Capillary: 99 mg/dL (ref 70–99)
Glucose-Capillary: 93 mg/dL (ref 70–99)

## 2022-04-08 MED ORDER — TRAVASOL 10 % IV SOLN
INTRAVENOUS | Status: AC
  Filled 2022-04-08: qty 1320

## 2022-04-08 MED ORDER — ONDANSETRON HCL 4 MG/2ML IJ SOLN
4.0000 mg | Freq: Four times a day (QID) | INTRAMUSCULAR | Status: DC | PRN
Start: 2022-04-08 — End: 2022-04-15
  Administered 2022-04-08 – 2022-04-12 (×3): 4 mg via INTRAVENOUS
  Filled 2022-04-08 (×3): qty 2

## 2022-04-08 MED ORDER — MAGNESIUM SULFATE 2 GM/50ML IV SOLN
2.0000 g | Freq: Once | INTRAVENOUS | Status: AC
  Administered 2022-04-08: 2 g via INTRAVENOUS
  Filled 2022-04-08: qty 50

## 2022-04-08 MED ORDER — INSULIN ASPART 100 UNIT/ML IJ SOLN
0.0000 [IU] | INTRAMUSCULAR | Status: DC
  Administered 2022-04-09: 1 [IU] via SUBCUTANEOUS

## 2022-04-08 NOTE — Progress Notes (Signed)
PROGRESS NOTE    Rebecca Day  TIW:[id number] DOB: 10/24/1985 DOA: 04/04/2022 PCP: [name], FNP    Brief Narrative:  Rebecca Day, is a 36 y.o. 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   Prior to arrival, patient denies sick contacts.  Recently returned to work at office job.  Developed nausea and vomiting on Friday 8/4, developed generalized weakness, family having to help at home with TPN.  Patient developed a fever over 103 at home and EMS was called.   ED course was notable for temperature of 104.4 Fahrenheit, no leukocytosis, no lactic acid elevation.  CTA chest negative for PE, bilateral lower lobe suspicious for infection.  Patient presenting with PICC line for home TPN.  She became hypotensive with a blood pressure of 79/33.  Levophed was started.  She was given 4 L of IV fluid.  Blood cultures and urine cultures were collected.  She was started on vancomycin and cefepime.  Endorses headache.  Denies neck stiffness or pain, no pain on the elevation, no abdominal pain, no light sensitivity.  GCS 15.  On room air.   PCCM was consulted for admission  8/6 presented to [name].  Blood cultures >, vancomycin/cefepime >, CTh negative, CT PE negative for PE, CT abdomen negative for obstruction 8/8 had to go back on Levophed later on in the day 8/9 off Levophed since 2 AM  Was diagnosed with sepsis due to virus   8/10 TRH pickup       Consultants:  pccm  Procedures:   Antimicrobials:     Subjective: Feels much better than when she first came in. Appetite not so great. Reports urinating a lot but has to sit on the commode for it to come. No dysuria.   Objective: Vitals:   04/08/22 1400 04/08/22 1500 04/08/22 1600 04/08/22 1700  BP: 106/65 103/63 106/64 (!) 104/54  Pulse: 72  75 84 81  Resp: (!) 23 (!) 22 (!) 30 (!) 27  Temp:  98.1 F (36.7 C)    TempSrc:  Oral    SpO2: 96% 100% 94% 94%  Weight:      Height:        Intake/Output Summary (Last 24 hours) at 04/08/2022 1823 Last data filed at 04/08/2022 1600 Gross per 24 hour  Intake 2836.2 ml  Output 3296 ml  Net -459.8 ml   Filed Weights   04/06/22 0500 04/07/22 0500 04/08/22 0500  Weight: 131.2 kg 127.9 kg 127.1 kg    Examination:  Calm, NAD Cta no w/r Reg s1/s2 no gallop Soft benign +bs No edema Aaoxox3  Mood and affect appropriate in current setting     Data Reviewed: I have personally reviewed following labs and imaging studies  CBC: Recent Labs  Lab 04/04/22 1530 04/04/22 1553 04/05/22 0115 04/08/22 0500  WBC 6.5  --  5.7 6.4  NEUTROABS 5.2  --   --  3.1  HGB 13.3 13.9 10.7* 11.4*  HCT 39.3 41.0 32.5* 34.3*  MCV 88.1  --  90.5 90.7  PLT 223  --  194 250   Basic Metabolic Panel: Recent Labs  Lab 04/04/22 1530 04/04/22 1553 04/05/22 0115 04/06/22 0826 04/08/22 0500  NA 135 136 139 140 135  K 4.0 3.9 3.6 4.1 4.3  CL 105 103 110 110 107  CO2 21*  --  20* 22 23  GLUCOSE 131* 124* 93 122* 99  BUN '10 10 8 7 9  '$ CREATININE 0.94 0.80 0.64 0.67 0.68  CALCIUM 9.1  --  8.1* 8.6* 9.0  MG  --   --  1.7 1.8 1.8  PHOS  --   --  3.9 3.4 4.5   GFR: Estimated Creatinine Clearance: 140.8 mL/min (by C-G formula based on SCr of 0.68 mg/dL). Liver Function Tests: Recent Labs  Lab 04/04/22 1530 04/06/22 0826 04/08/22 0500  AST 37 32 129*  ALT 54* 45* 125*  ALKPHOS 92 71 74  BILITOT 0.9 0.3 0.2*  PROT 6.9 5.8* 5.9*  ALBUMIN 3.2* 2.5* 2.7*   No results for input(s): "LIPASE", "AMYLASE" in the last 168 hours. No results for input(s): "AMMONIA" in the last 168 hours. Coagulation Profile: No results for input(s): "INR", "PROTIME" in the last 168 hours. Cardiac Enzymes: No results for input(s): "CKTOTAL", "CKMB", "CKMBINDEX", "TROPONINI" in the last 168 hours. BNP (last 3  results) No results for input(s): "PROBNP" in the last 8760 hours. HbA1C: No results for input(s): "HGBA1C" in the last 72 hours. CBG: Recent Labs  Lab 04/07/22 0732 04/07/22 1202 04/07/22 1534 04/08/22 0723 04/08/22 1141  GLUCAP 115* 89 98 115* 99   Lipid Profile: Recent Labs    04/06/22 0826 04/08/22 0500  TRIG 55 89   Thyroid Function Tests: No results for input(s): "TSH", "T4TOTAL", "FREET4", "T3FREE", "THYROIDAB" in the last 72 hours. Anemia Panel: No results for input(s): "VITAMINB12", "FOLATE", "FERRITIN", "TIBC", "IRON", "RETICCTPCT" in the last 72 hours. Sepsis Labs: Recent Labs  Lab 04/04/22 1530 04/04/22 1930 04/05/22 0115 04/06/22 0826 04/07/22 0116  PROCALCITON 0.66  --  1.28 0.55  --   LATICACIDVEN 1.7 0.9  --   --  0.9    Recent Results (from the past 240 hour(s))  Blood culture (routine x 2)     Status: Abnormal   Collection Time: 04/04/22  3:18 PM   Specimen: BLOOD  Result Value Ref Range Status   Specimen Description BLOOD LEFT ANTECUBITAL  Final   Special Requests   Final    BOTTLES DRAWN AEROBIC AND ANAEROBIC Blood Culture results may not be optimal due to an inadequate volume of blood received in culture bottles   Culture  Setup Time   Final    GRAM POSITIVE COCCI IN CLUSTERS ANAEROBIC BOTTLE ONLY PHARMD A. PAYTES [id number] '@1352'$  FH    Culture (A)  Final    STAPHYLOCOCCUS EPIDERMIDIS THE SIGNIFICANCE OF ISOLATING THIS ORGANISM FROM A SINGLE SET OF BLOOD CULTURES WHEN MULTIPLE SETS ARE DRAWN IS UNCERTAIN. PLEASE NOTIFY THE MICROBIOLOGY DEPARTMENT WITHIN ONE WEEK IF SPECIATION AND SENSITIVITIES ARE REQUIRED. Performed at [facility], [place] [address], [place], [place] [address]    Report Status 04/07/2022 FINAL  Final  Blood Culture ID Panel (Reflexed)     Status: Abnormal   Collection Time: 04/04/22  3:18 PM  Result Value Ref Range Status   Enterococcus faecalis NOT DETECTED NOT DETECTED Final   Enterococcus Faecium NOT DETECTED NOT  DETECTED Final   Listeria monocytogenes NOT DETECTED NOT DETECTED Final   Staphylococcus species DETECTED (A) NOT DETECTED Final    Comment: CRITICAL RESULT CALLED TO, READ BACK BY AND VERIFIED WITH: PHARMD A. PAYTES [id number] '@1352'$  FH    Staphylococcus aureus (BCID) NOT DETECTED NOT DETECTED Final   Staphylococcus epidermidis DETECTED (A) NOT DETECTED Final    Comment: CRITICAL RESULT CALLED TO, READ BACK BY  AND VERIFIED WITH: PHARMD A. PAYTES [id number] '@1352'$  FH    Staphylococcus lugdunensis NOT DETECTED NOT DETECTED Final   Streptococcus species NOT DETECTED NOT DETECTED Final   Streptococcus agalactiae NOT DETECTED NOT DETECTED Final   Streptococcus pneumoniae NOT DETECTED NOT DETECTED Final   Streptococcus pyogenes NOT DETECTED NOT DETECTED Final   A.calcoaceticus-baumannii NOT DETECTED NOT DETECTED Final   Bacteroides fragilis NOT DETECTED NOT DETECTED Final   Enterobacterales NOT DETECTED NOT DETECTED Final   Enterobacter cloacae complex NOT DETECTED NOT DETECTED Final   Escherichia coli NOT DETECTED NOT DETECTED Final   Klebsiella aerogenes NOT DETECTED NOT DETECTED Final   Klebsiella oxytoca NOT DETECTED NOT DETECTED Final   Klebsiella pneumoniae NOT DETECTED NOT DETECTED Final   Proteus species NOT DETECTED NOT DETECTED Final   Salmonella species NOT DETECTED NOT DETECTED Final   Serratia marcescens NOT DETECTED NOT DETECTED Final   Haemophilus influenzae NOT DETECTED NOT DETECTED Final   Neisseria meningitidis NOT DETECTED NOT DETECTED Final   Pseudomonas aeruginosa NOT DETECTED NOT DETECTED Final   Stenotrophomonas maltophilia NOT DETECTED NOT DETECTED Final   Candida albicans NOT DETECTED NOT DETECTED Final   Candida auris NOT DETECTED NOT DETECTED Final   Candida glabrata NOT DETECTED NOT DETECTED Final   Candida krusei NOT DETECTED NOT DETECTED Final   Candida parapsilosis NOT DETECTED NOT DETECTED Final   Candida tropicalis NOT DETECTED NOT DETECTED Final   Cryptococcus  neoformans/gattii NOT DETECTED NOT DETECTED Final   Methicillin resistance mecA/C NOT DETECTED NOT DETECTED Final    Comment: Performed at [facility], [address], [place], [place] [address]  Blood culture (routine x 2)     Status: None (Preliminary result)   Collection Time: 04/04/22  3:23 PM   Specimen: BLOOD  Result Value Ref Range Status   Specimen Description BLOOD LEFT ANTECUBITAL  Final   Special Requests   Final    BOTTLES DRAWN AEROBIC AND ANAEROBIC Blood Culture adequate volume   Culture   Final    NO GROWTH 4 DAYS Performed at [facility], [place] [address] [place], [place] [address]    Report Status PENDING  Incomplete  Resp Panel by RT-PCR (Flu A&B, Covid) Anterior Nasal Swab     Status: None   Collection Time: 04/04/22  3:23 PM   Specimen: Anterior Nasal Swab  Result Value Ref Range Status   SARS Coronavirus 2 by RT PCR NEGATIVE NEGATIVE Final    Comment: (NOTE) SARS-CoV-2 target nucleic acids are NOT DETECTED.  The SARS-CoV-2 RNA is generally detectable in upper respiratory specimens during the acute phase of infection. The lowest concentration of SARS-CoV-2 viral copies this assay can detect is 138 copies/mL. A negative result does not preclude SARS-Cov-2 infection and should not be used as the sole basis for treatment or other patient management decisions. A negative result may occur with  improper specimen collection/handling, submission of specimen other than nasopharyngeal swab, presence of viral mutation(s) within the areas targeted by this assay, and inadequate number of viral copies(<138 copies/mL). A negative result must be combined with clinical observations, patient history, and epidemiological information. The expected result is Negative.  Fact Sheet for Patients:  EntrepreneurPulse.com.au  Fact Sheet for Healthcare Providers:  IncredibleEmployment.be  This test is no t yet approved or cleared by  the [place] FDA and  has been authorized for detection and/or diagnosis of SARS-CoV-2 by FDA under an Emergency Use Authorization (EUA). This EUA will remain  in effect (  meaning this test can be used) for the duration of the COVID-19 declaration under Section 564(b)(1) of the Act, 21 U.S.C.section 360bbb-3(b)(1), unless the authorization is terminated  or revoked sooner.       Influenza A by PCR NEGATIVE NEGATIVE Final   Influenza B by PCR NEGATIVE NEGATIVE Final    Comment: (NOTE) The Xpert Xpress SARS-CoV-2/FLU/RSV plus assay is intended as an aid in the diagnosis of influenza from Nasopharyngeal swab specimens and should not be used as a sole basis for treatment. Nasal washings and aspirates are unacceptable for Xpert Xpress SARS-CoV-2/FLU/RSV testing.  Fact Sheet for Patients: EntrepreneurPulse.com.au  Fact Sheet for Healthcare Providers: IncredibleEmployment.be  This test is not yet approved or cleared by the [place] FDA and has been authorized for detection and/or diagnosis of SARS-CoV-2 by FDA under an Emergency Use Authorization (EUA). This EUA will remain in effect (meaning this test can be used) for the duration of the COVID-19 declaration under Section 564(b)(1) of the Act, 21 U.S.C. section 360bbb-3(b)(1), unless the authorization is terminated or revoked.  Performed at [facility], [place] [address] [place], [place] [address]   Urine Culture     Status: None   Collection Time: 04/04/22  6:47 PM   Specimen: Urine, Clean Catch  Result Value Ref Range Status   Specimen Description URINE, CLEAN CATCH  Final   Special Requests NONE  Final   Culture   Final    NO GROWTH Performed at [facility], [place] [address] [place], [place] [address]    Report Status 04/06/2022 FINAL  Final  MRSA Next Gen by PCR, Nasal     Status: None   Collection Time: 04/04/22  9:45 PM   Specimen: Nasal Mucosa; Nasal Swab   Result Value Ref Range Status   MRSA by PCR Next Gen NOT DETECTED NOT DETECTED Final    Comment: (NOTE) The GeneXpert MRSA Assay (FDA approved for NASAL specimens only), is one component of a comprehensive MRSA colonization surveillance program. It is not intended to diagnose MRSA infection nor to guide or monitor treatment for MRSA infections. Test performance is not FDA approved in patients less than 83 years old. Performed at [facility], [place] [address] [place], [place] [address]          Radiology Studies: US Abdomen Limited RUQ (LIVER/GB)  Result Date: 04/08/2022 CLINICAL DATA:  Elevated liver function tests EXAM: ULTRASOUND ABDOMEN LIMITED RIGHT UPPER QUADRANT COMPARISON:  04/04/2022 FINDINGS: Gallbladder: Echogenic material within the gallbladder consistent with tumefactive sludge. No evidence of cholelithiasis. No gallbladder wall thickening or pericholecystic fluid. Negative sonographic Murphy sign. Common bile duct: Diameter: 3 mm Liver: No focal lesion identified. Within normal limits in parenchymal echogenicity. Portal vein is patent on color Doppler imaging with normal direction of blood flow towards the liver. Other: None. IMPRESSION: 1. Gallbladder sludge. No evidence of cholelithiasis or cholecystitis. 2. Otherwise unremarkable right upper quadrant ultrasound. Electronically Signed   By: Randa Ngo M.D.   On: 04/08/2022 15:48        Scheduled Meds:  Chlorhexidine Gluconate Cloth  6 each Topical Daily   enoxaparin (LOVENOX) injection  60 mg Subcutaneous Q24H   insulin aspart  0-9 Units Subcutaneous 4 times per day   metoCLOPramide  10 mg Oral TID   midodrine  10 mg Oral Q8H   sodium chloride flush  10-40 mL Intracatheter Q12H   Continuous Infusions:  sodium chloride Stopped (02/22/93 8546)   TPN CYCLIC-ADULT (ION)  80 mL/hr at 04/08/22 1735    Assessment & Plan:   Principal Problem:   Shock ([place]) Active Problems:   Lupus ([place])   Fever   Viral  infection   Headache   History of gastric bypass   Elevated liver enzymes   Septic shock Fever required IV pressors intermittently -No identified source of infection -No growth on cultures -1 out of 2 bottles did reveal staff epidermidis, felt to be clinically insignificant -Off antibiotics 8/10Pccm felt sepsis from viral infection/syndrome On midodrine  Elevated Enzymes Likely from above Will ck RUQ monitor  Headache -Chronic headaches -8/10 CT head negative CT head negative    History of gastric bypass surgery 01/28/2022, gastric sleeve -Remains on TPN -Attempting to take in orally, but still poor intake  History of lupus -Was not on any medications prior to hospitalization     DVT prophylaxis: lovenox Code Status:full Family Communication: none at bedside Disposition Plan: home Status is: Inpatient Remains inpatient appropriate because: Iv treatment.        LOS: 4 days   Time spent: 35 min    Nolberto Hanlon, MD [facility] Pager [phone]  If 7PM-7AM, please contact night-coverage 04/08/2022, 6:23 PM

## 2022-04-08 NOTE — Progress Notes (Signed)
[facility] ADULT ICU REPLACEMENT PROTOCOL   The patient does apply for the [facility] Adult ICU Electrolyte Replacment Protocol based on the criteria listed below:   1.Exclusion criteria: TCTS patients, ECMO patients, and Dialysis patients 2. Is GFR >/= 30 ml/min? Yes.    Patient's GFR today is >60 3. Is SCr </= 2? Yes.   Patient's SCr is 0.68 mg/dL 4. Did SCr increase >/= 0.5 in 24 hours? No. 5.Pt's weight >40kg  Yes.   6. Abnormal electrolyte(s): mag 1.8  7. Electrolytes replaced per protocol 8.  Call MD STAT for K+ </= 2.5, Phos </= 1, or Mag </= 1 Physician:  n/a  Darlys Gales 04/08/2022 6:01 AM

## 2022-04-08 NOTE — Progress Notes (Signed)
PHARMACY - TOTAL PARENTERAL NUTRITION CONSULT NOTE  Indication:  Nausea/vomiting post gastric bypass surgery  Patient Measurements: Height: '5\' 8"'  (172.7 cm) Weight: 127.1 kg (280 lb 3.3 oz) IBW/kg (Calculated) : 63.9 TPN AdjBW (KG): 79.9 Body mass index is 42.6 kg/m. Usual Weight: 127kg   Assessment:  55 YOF with history of gastric sleeve bypass in June of 2023 and presented with fever, nausea and vomiting.  Patient has been on TPN and a regular diet with minimal intake.  Pharmacy consulted to continue TPN from PTA.  Glucose / Insulin: no hx DM - CBGs < 180.  Used 0 unit SSI in the past 24 hrs Electrolytes: all WNL (Mag 1.8 post 2gm on 8/8) Renal: SCr < 1, BUN WNL Hepatic: AST/ALT trended up to 129/125, alk phos / tbili / TG WNL Intake / Output; MIVF:  UOP 1.4 ml/kg/hr, LBM 8/8 GI Imaging:  8/6 CT - normal  GI Surgeries / Procedures:  01/2022 - gastric bypass   Central access: single lumen PICC place 03/12/22 PTA TPN start date: con't from home (started 04/05/2022, inpatient)  Nutritional Goals:  RD Assessment: 2000-2200 kCal, 120-140g protein, 2-2.2L per day  Home TPN per [facility]: 2354m cycled over 12 hrs:  230g CHO, 92g AA, 46g ILE (volume recently increase outpt d/t dehydration) Sodium chloride: 428m Sodium acetate: 9517mSodium phosphate: 28m73mCL: 70mE2mag sulfate: 10mEq70mlcium gluc: 10mEq 65mrrent Nutrition:  TPN  CMD - PO intake improving, RD following  Plan:  Begin cyclic TPN, infuse 2400mL 0903OB6 hrs (80-160 ml/hr, GIR 1.36-2.73 mg/kg/min) TPN provides 132g AA and 2189 kCal, meeting 100% of needs Electrolytes in TPN: increase Na to 85mEq/L56m50mEq/L,31m5mEq/L, i10mease Mg 8mEq/L, re48me Phos to 10mmol/L, C28m 1:1 Add standard MVI and trace elements to TPN Adjust sensitive SSI to correspond with cyclic TPN hours (0500, 1100, 4996, 2000) Mag sulfate 2gm IV already given Monitor TPN labs on Mon/Thurs, repeat labs on Sun F/u tolerance to cyclic  TPN to reduce cycle further (was on 12-hr cycle PTA) Monitor Na and the need to reduce free water in TPN  Monitor BP on midodrine  Kahla Risdon D. Uziel Covault, PharmDMina MarblePS, BCCCP 8/10/2[place]7:33 AM

## 2022-04-08 NOTE — Progress Notes (Signed)
Nutrition Brief Note  Per discussion in ICU rounds and with RN, patient is eating a little better today. She was NPO this morning for ultrasound at 3pm. Patient is receiving a carb modified diet with snacks and supplements TID between meals. TPN is changing to cyclic regimen today to provide 2189 kcal and 132 gm protein. This will meet 100% of patient's estimated needs. May be able to decrease calorie and protein provision in TPN as oral intake improves. Will begin calorie count this afternoon to assess adequacy of oral intake.  Lucas Mallow RD, LDN, CNSC Please refer to Amion for contact information.

## 2022-04-08 NOTE — Plan of Care (Signed)

## 2022-04-08 NOTE — Progress Notes (Signed)
  Progress Note   Date: 04/07/2022  Patient Name: Rebecca Day        MRN#: [id number]   Clarification of diagnosis:   sepsis due to virus

## 2022-04-09 DIAGNOSIS — R7881 Bacteremia: Secondary | ICD-10-CM | POA: Diagnosis not present

## 2022-04-09 DIAGNOSIS — R579 Shock, unspecified: Secondary | ICD-10-CM | POA: Diagnosis not present

## 2022-04-09 DIAGNOSIS — R7401 Elevation of levels of liver transaminase levels: Secondary | ICD-10-CM | POA: Diagnosis not present

## 2022-04-09 DIAGNOSIS — B957 Other staphylococcus as the cause of diseases classified elsewhere: Secondary | ICD-10-CM | POA: Diagnosis not present

## 2022-04-09 LAB — COMPREHENSIVE METABOLIC PANEL
ALT: 188 U/L — ABNORMAL HIGH (ref 0–44)
AST: 143 U/L — ABNORMAL HIGH (ref 15–41)
Albumin: 3.1 g/dL — ABNORMAL LOW (ref 3.5–5.0)
Alkaline Phosphatase: 89 U/L (ref 38–126)
Anion gap: 6 (ref 5–15)
BUN: 16 mg/dL (ref 6–20)
CO2: 23 mmol/L (ref 22–32)
Calcium: 9.5 mg/dL (ref 8.9–10.3)
Chloride: 105 mmol/L (ref 98–111)
Creatinine, Ser: 0.64 mg/dL (ref 0.44–1.00)
GFR, Estimated: >60 mL/min (ref >60)
Glucose, Bld: 115 mg/dL — ABNORMAL HIGH (ref 70–99)
Potassium: 4.6 mmol/L (ref 3.5–5.1)
Sodium: 134 mmol/L — ABNORMAL LOW (ref 135–145)
Total Bilirubin: 0.3 mg/dL (ref 0.3–1.2)
Total Protein: 6.4 g/dL — ABNORMAL LOW (ref 6.5–8.1)

## 2022-04-09 LAB — CULTURE, BLOOD (ROUTINE X 2)
Culture: NO GROWTH
Special Requests: ADEQUATE

## 2022-04-09 LAB — GLUCOSE, CAPILLARY
Glucose-Capillary: 124 mg/dL — ABNORMAL HIGH (ref 70–99)
Glucose-Capillary: 111 mg/dL — ABNORMAL HIGH (ref 70–99)
Glucose-Capillary: 83 mg/dL (ref 70–99)

## 2022-04-09 LAB — HEPATITIS B SURFACE ANTIGEN: Hepatitis B Surface Ag: NONREACTIVE

## 2022-04-09 LAB — PHOSPHORUS: Phosphorus: 4.1 mg/dL (ref 2.5–4.6)

## 2022-04-09 LAB — MAGNESIUM: Magnesium: 1.9 mg/dL (ref 1.7–2.4)

## 2022-04-09 MED ORDER — INSULIN ASPART 100 UNIT/ML IJ SOLN: 0.0000 [IU] | Freq: Two times a day (BID) | INTRAMUSCULAR | Status: DC

## 2022-04-09 MED ORDER — TRAVASOL 10 % IV SOLN
INTRAVENOUS | Status: DC
  Filled 2022-04-09: qty 1320

## 2022-04-09 MED ORDER — ENSURE MAX PROTEIN PO LIQD
11.0000 [oz_av] | Freq: Three times a day (TID) | ORAL | Status: DC
Start: 2022-04-09 — End: 2022-04-12
  Administered 2022-04-09 (×2): 11 [oz_av] via ORAL
  Administered 2022-04-10: 237 mL via ORAL
  Administered 2022-04-11 – 2022-04-12 (×3): 11 [oz_av] via ORAL
  Filled 2022-04-09 (×2): qty 330

## 2022-04-09 NOTE — Progress Notes (Signed)
PHARMACY - TOTAL PARENTERAL NUTRITION CONSULT NOTE  Indication:  Nausea/vomiting post gastric bypass surgery  Patient Measurements: Height: '5\' 8"'  (172.7 cm) Weight: 124.9 kg (275 lb 5.7 oz) IBW/kg (Calculated) : 63.9 TPN AdjBW (KG): 79.9 Body mass index is 41.87 kg/m. Usual Weight: 127kg   Assessment:  10 YOF with history of gastric sleeve bypass in June of 2023 on TPN outpatient. Patient admitted with fever, nausea and vomiting found to have sepsis from viral infection.  Patient has been on TPN and a regular diet with minimal intake.  Pharmacy consulted to continue TPN from PTA.  Glucose / Insulin: no hx DM - CBGs < 180.  Used 1 unit SSI in the past 24 hrs Electrolytes: Na 134, Mg 1.9 (s/p 2g IV), CoCa 10.2, others WNL   Renal: SCr 0.64, BUN WNL Hepatic: AST/ALT trended up to 143/188, alk phos / tbili / TG WNL Intake / Output; MIVF:  UOP 1.2 ml/kg/hr, LBM 8/8 GI Imaging:  8/6 CT - normal  8/10 KUB: No evidence of cholelithiasis or cholecystitis GI Surgeries / Procedures:  01/2022 - gastric bypass   Central access: single lumen PICC place 03/12/22 PTA TPN start date: con't from home (started 04/05/2022, inpatient)  Nutritional Goals:  RD Assessment: 2000-2200 kCal, 120-140g protein, 2-2.2L per day  Home TPN per [facility]: 235m cycled over 12 hrs:  230g CHO, 92g AA, 46g ILE (volume recently increase outpt d/t dehydration) Sodium chloride: 432m Sodium acetate: 9532mSodium phosphate: 38m34mCL: 70mE62mag sulfate: 10mEq19mlcium gluc: 10mEq 30mrrent Nutrition:  TPN  8/10 CMD - PO intake improving, RD conducting calorie count 8/11 CMD- ate half a sausage, bites of egg, half a banana for breakfast  Plan:  Cycle goal TPN to 12 hrs (109- 218 ml/hr, GIR 1.9- 3.8 mg/kg/min), provides 132 g AA and 2189 kCal, meeting 100% of needs Electrolytes in TPN: Increase Na to 125 mEq/L, Mg 10 mEq/L; Decrease K 35 mEq/L, Ca 2 mEq/L; Continue Phos 10 mmol/L, Cl:Ac 1:1 Add standard MVI  and trace elements to TPN Decrease sensitive SSI and BG check to q12 hr   Monitor TPN labs 8/12 then on Mon/Thurs  F/u calorie count/PO intake and wean TPN as able  Neela Zecca CBenetta SparD, BCPS, BCCP [facility] Pharmacist  Please check AMION for all [facility] PharLos Chavesnumbers After 10:00 PM, call Main PhRoca

## 2022-04-09 NOTE — Plan of Care (Signed)

## 2022-04-09 NOTE — Progress Notes (Signed)
PROGRESS NOTE    Rebecca Day  DTO:[id number] DOB: September 05, 1985 DOA: 04/04/2022 PCP: [name], FNP    Brief Narrative:  Rebecca Day, is a 36 y.o. female, who presented to the [facility] ED with a chief complaint of fever.  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   Prior to arrival, patient denies sick contacts.  Recently returned to work at office job.  Developed nausea and vomiting on Friday 8/4, developed generalized weakness, family having to help at home with TPN.  Patient developed a fever over 103 at home and EMS was called.   ED course was notable for temperature of 104.4 Fahrenheit, no leukocytosis, no lactic acid elevation.  CTA chest negative for PE, bilateral lower lobe suspicious for infection.  Patient presenting with PICC line for home TPN.  She became hypotensive with a blood pressure of 79/33.  Levophed was started.  She was given 4 L of IV fluid.  Blood cultures and urine cultures were collected.  She was started on vancomycin and cefepime.  Endorses headache.  Denies neck stiffness or pain, no pain on the elevation, no abdominal pain, no light sensitivity.  GCS 15.  On room air.   PCCM was consulted for admission   8/6 [facility] to [name].  Blood cultures >, vancomycin/cefepime >, CTh negative, CT PE negative for PE, CT abdomen negative for obstruction 8/8 had to go back on Levophed later on in the day 8/9 off Levophed since 2 AM   Initially on antibiotics then discontinued. Blood cultures with Staph epidermidis.  Suspected contamination.  Her episode was suspected to be viral syndrome.       Assessment & Plan:   Septic shock present on admission. Hemodynamically improving.  No localizing source of infection. Blood cultures 1 out of 2 bottles with Staph epidermidis, currently off antibiotics.  On midodrine to sustain blood pressures. Repeat blood cultures today.  Due to her  significant symptoms on presentation, still likely her PICC line is infected.  Will discuss with infectious disease team today. Mobilize.  Check orthostatic blood pressures.  Gastric bypass, intolerance to oral intake on TPN support. Followed by dietitian.  Gradually introducing oral intake.  Pharmacy adjusting TPN.  Will continue TPN today. Hyperglycemia covered by insulin per TPN feeding.  Elevated LFTs: Transaminitis probably related to hypotension.  Will discuss with pharmacy about TPN.  Right upper quadrant ultrasound was normal.    DVT prophylaxis:   Lovenox subcu   Code Status: Full code Family Communication: None at the bedside Disposition Plan: Status is: Inpatient Remains inpatient appropriate because: TPN, IV fluid, source of infection identification.     Consultants:  Critical care Infectious disease  Procedures:  None  Antimicrobials:  None   Subjective: Patient seen and examined.  No overnight events.  She started having some dizziness and headache when she walked to the bathroom.  Denies any shortness of breath cough or fever. Has some traction pain on the PICC line site but no other obvious pain.  Objective: Vitals:   04/09/22 1053 04/09/22 1056 04/09/22 1059 04/09/22 1100  BP: (!) 93/59 107/71 103/79 96/70  Pulse:   (!) 105   Resp:   20   Temp:      TempSrc:      SpO2:   99%   Weight:      Height:        Intake/Output Summary (Last 24 hours) at 04/09/2022 1118 Last  data filed at 04/09/2022 1000 Gross per 24 hour  Intake 3723.6 ml  Output 3489 ml  Net 234.6 ml   Filed Weights   04/07/22 0500 04/08/22 0500 04/09/22 0409  Weight: 127.9 kg 127.1 kg 124.9 kg    Examination:  General exam: Appears calm and comfortable  Respiratory system: Clear to auscultation. Respiratory effort normal.  On room air. Cardiovascular system: S1 & S2 heard, RRR. No pedal edema. Gastrointestinal system: Abdomen is nondistended, soft and nontender. No  organomegaly or masses felt. Normal bowel sounds heard.  His scars are nontender and healthy. Central nervous system: Alert and oriented. No focal neurological deficits. Extremities: Symmetric 5 x 5 power. Skin: No rashes, lesions or ulcers PICC line nontender, dressing intact. Psychiatry: Judgement and insight appear normal. Mood & affect appropriate.     Data Reviewed: I have personally reviewed following labs and imaging studies  CBC: Recent Labs  Lab 04/04/22 1530 04/04/22 1553 04/05/22 0115 04/08/22 0500  WBC 6.5  --  5.7 6.4  NEUTROABS 5.2  --   --  3.1  HGB 13.3 13.9 10.7* 11.4*  HCT 39.3 41.0 32.5* 34.3*  MCV 88.1  --  90.5 90.7  PLT 223  --  194 329   Basic Metabolic Panel: Recent Labs  Lab 04/04/22 1530 04/04/22 1553 04/05/22 0115 04/06/22 0826 04/08/22 0500 04/09/22 0733  NA 135 136 139 140 135 134*  K 4.0 3.9 3.6 4.1 4.3 4.6  CL 105 103 110 110 107 105  CO2 21*  --  20* '22 23 23  '$ GLUCOSE 131* 124* 93 122* 99 115*  BUN '10 10 8 7 9 16  '$ CREATININE 0.94 0.80 0.64 0.67 0.68 0.64  CALCIUM 9.1  --  8.1* 8.6* 9.0 9.5  MG  --   --  1.7 1.8 1.8 1.9  PHOS  --   --  3.9 3.4 4.5 4.1   GFR: Estimated Creatinine Clearance: 139.4 mL/min (by C-G formula based on SCr of 0.64 mg/dL). Liver Function Tests: Recent Labs  Lab 04/04/22 1530 04/06/22 0826 04/08/22 0500 04/09/22 0733  AST 37 32 129* 143*  ALT 54* 45* 125* 188*  ALKPHOS 92 71 74 89  BILITOT 0.9 0.3 0.2* 0.3  PROT 6.9 5.8* 5.9* 6.4*  ALBUMIN 3.2* 2.5* 2.7* 3.1*   No results for input(s): "LIPASE", "AMYLASE" in the last 168 hours. No results for input(s): "AMMONIA" in the last 168 hours. Coagulation Profile: No results for input(s): "INR", "PROTIME" in the last 168 hours. Cardiac Enzymes: No results for input(s): "CKTOTAL", "CKMB", "CKMBINDEX", "TROPONINI" in the last 168 hours. BNP (last 3 results) No results for input(s): "PROBNP" in the last 8760 hours. HbA1C: No results for input(s): "HGBA1C"  in the last 72 hours. CBG: Recent Labs  Lab 04/08/22 0723 04/08/22 1141 04/08/22 1939 04/09/22 0401 04/09/22 0951  GLUCAP 115* 99 93 124* 111*   Lipid Profile: Recent Labs    04/08/22 0500  TRIG 89   Thyroid Function Tests: No results for input(s): "TSH", "T4TOTAL", "FREET4", "T3FREE", "THYROIDAB" in the last 72 hours. Anemia Panel: No results for input(s): "VITAMINB12", "FOLATE", "FERRITIN", "TIBC", "IRON", "RETICCTPCT" in the last 72 hours. Sepsis Labs: Recent Labs  Lab 04/04/22 1530 04/04/22 1930 04/05/22 0115 04/06/22 0826 04/07/22 0116  PROCALCITON 0.66  --  1.28 0.55  --   LATICACIDVEN 1.7 0.9  --   --  0.9    Recent Results (from the past 240 hour(s))  Blood culture (routine x 2)  Status: Abnormal   Collection Time: 04/04/22  3:18 PM   Specimen: BLOOD  Result Value Ref Range Status   Specimen Description BLOOD LEFT ANTECUBITAL  Final   Special Requests   Final    BOTTLES DRAWN AEROBIC AND ANAEROBIC Blood Culture results may not be optimal due to an inadequate volume of blood received in culture bottles   Culture  Setup Time   Final    GRAM POSITIVE COCCI IN CLUSTERS ANAEROBIC BOTTLE ONLY PHARMD A. PAYTES [id number] '@1352'$  FH    Culture (A)  Final    STAPHYLOCOCCUS EPIDERMIDIS THE SIGNIFICANCE OF ISOLATING THIS ORGANISM FROM A SINGLE SET OF BLOOD CULTURES WHEN MULTIPLE SETS ARE DRAWN IS UNCERTAIN. PLEASE NOTIFY THE MICROBIOLOGY DEPARTMENT WITHIN ONE WEEK IF SPECIATION AND SENSITIVITIES ARE REQUIRED. Performed at [facility], [place] [address], [place], [place] [address]    Report Status 04/07/2022 FINAL  Final  Blood Culture ID Panel (Reflexed)     Status: Abnormal   Collection Time: 04/04/22  3:18 PM  Result Value Ref Range Status   Enterococcus faecalis NOT DETECTED NOT DETECTED Final   Enterococcus Faecium NOT DETECTED NOT DETECTED Final   Listeria monocytogenes NOT DETECTED NOT DETECTED Final   Staphylococcus species DETECTED (A) NOT DETECTED  Final    Comment: CRITICAL RESULT CALLED TO, READ BACK BY AND VERIFIED WITH: PHARMD A. PAYTES [id number] '@1352'$  FH    Staphylococcus aureus (BCID) NOT DETECTED NOT DETECTED Final   Staphylococcus epidermidis DETECTED (A) NOT DETECTED Final    Comment: CRITICAL RESULT CALLED TO, READ BACK BY AND VERIFIED WITH: PHARMD A. PAYTES [id number] '@1352'$  FH    Staphylococcus lugdunensis NOT DETECTED NOT DETECTED Final   Streptococcus species NOT DETECTED NOT DETECTED Final   Streptococcus agalactiae NOT DETECTED NOT DETECTED Final   Streptococcus pneumoniae NOT DETECTED NOT DETECTED Final   Streptococcus pyogenes NOT DETECTED NOT DETECTED Final   A.calcoaceticus-baumannii NOT DETECTED NOT DETECTED Final   Bacteroides fragilis NOT DETECTED NOT DETECTED Final   Enterobacterales NOT DETECTED NOT DETECTED Final   Enterobacter cloacae complex NOT DETECTED NOT DETECTED Final   Escherichia coli NOT DETECTED NOT DETECTED Final   Klebsiella aerogenes NOT DETECTED NOT DETECTED Final   Klebsiella oxytoca NOT DETECTED NOT DETECTED Final   Klebsiella pneumoniae NOT DETECTED NOT DETECTED Final   Proteus species NOT DETECTED NOT DETECTED Final   Salmonella species NOT DETECTED NOT DETECTED Final   Serratia marcescens NOT DETECTED NOT DETECTED Final   Haemophilus influenzae NOT DETECTED NOT DETECTED Final   Neisseria meningitidis NOT DETECTED NOT DETECTED Final   Pseudomonas aeruginosa NOT DETECTED NOT DETECTED Final   Stenotrophomonas maltophilia NOT DETECTED NOT DETECTED Final   Candida albicans NOT DETECTED NOT DETECTED Final   Candida auris NOT DETECTED NOT DETECTED Final   Candida glabrata NOT DETECTED NOT DETECTED Final   Candida krusei NOT DETECTED NOT DETECTED Final   Candida parapsilosis NOT DETECTED NOT DETECTED Final   Candida tropicalis NOT DETECTED NOT DETECTED Final   Cryptococcus neoformans/gattii NOT DETECTED NOT DETECTED Final   Methicillin resistance mecA/C NOT DETECTED NOT DETECTED Final     Comment: Performed at [facility], [address], [place], [place] [address]  Blood culture (routine x 2)     Status: None   Collection Time: 04/04/22  3:23 PM   Specimen: BLOOD  Result Value Ref Range Status   Specimen Description BLOOD LEFT ANTECUBITAL  Final   Special Requests   Final    BOTTLES  DRAWN AEROBIC AND ANAEROBIC Blood Culture adequate volume   Culture   Final    NO GROWTH 5 DAYS Performed at [facility], [place] [address] [place], [place] [address]    Report Status 04/09/2022 FINAL  Final  Resp Panel by RT-PCR (Flu A&B, Covid) Anterior Nasal Swab     Status: None   Collection Time: 04/04/22  3:23 PM   Specimen: Anterior Nasal Swab  Result Value Ref Range Status   SARS Coronavirus 2 by RT PCR NEGATIVE NEGATIVE Final    Comment: (NOTE) SARS-CoV-2 target nucleic acids are NOT DETECTED.  The SARS-CoV-2 RNA is generally detectable in upper respiratory specimens during the acute phase of infection. The lowest concentration of SARS-CoV-2 viral copies this assay can detect is 138 copies/mL. A negative result does not preclude SARS-Cov-2 infection and should not be used as the sole basis for treatment or other patient management decisions. A negative result may occur with  improper specimen collection/handling, submission of specimen other than nasopharyngeal swab, presence of viral mutation(s) within the areas targeted by this assay, and inadequate number of viral copies(<138 copies/mL). A negative result must be combined with clinical observations, patient history, and epidemiological information. The expected result is Negative.  Fact Sheet for Patients:  EntrepreneurPulse.com.au  Fact Sheet for Healthcare Providers:  IncredibleEmployment.be  This test is no t yet approved or cleared by the [place] FDA and  has been authorized for detection and/or diagnosis of SARS-CoV-2 by FDA under an Emergency Use Authorization  (EUA). This EUA will remain  in effect (meaning this test can be used) for the duration of the COVID-19 declaration under Section 564(b)(1) of the Act, 21 U.S.C.section 360bbb-3(b)(1), unless the authorization is terminated  or revoked sooner.       Influenza A by PCR NEGATIVE NEGATIVE Final   Influenza B by PCR NEGATIVE NEGATIVE Final    Comment: (NOTE) The Xpert Xpress SARS-CoV-2/FLU/RSV plus assay is intended as an aid in the diagnosis of influenza from Nasopharyngeal swab specimens and should not be used as a sole basis for treatment. Nasal washings and aspirates are unacceptable for Xpert Xpress SARS-CoV-2/FLU/RSV testing.  Fact Sheet for Patients: EntrepreneurPulse.com.au  Fact Sheet for Healthcare Providers: IncredibleEmployment.be  This test is not yet approved or cleared by the [place] FDA and has been authorized for detection and/or diagnosis of SARS-CoV-2 by FDA under an Emergency Use Authorization (EUA). This EUA will remain in effect (meaning this test can be used) for the duration of the COVID-19 declaration under Section 564(b)(1) of the Act, 21 U.S.C. section 360bbb-3(b)(1), unless the authorization is terminated or revoked.  Performed at [facility], [place] [address] [place], [place] [address]   Urine Culture     Status: None   Collection Time: 04/04/22  6:47 PM   Specimen: Urine, Clean Catch  Result Value Ref Range Status   Specimen Description URINE, CLEAN CATCH  Final   Special Requests NONE  Final   Culture   Final    NO GROWTH Performed at [facility], [place] [address] [place], [place] [address]    Report Status 04/06/2022 FINAL  Final  MRSA Next Gen by PCR, Nasal     Status: None   Collection Time: 04/04/22  9:45 PM   Specimen: Nasal Mucosa; Nasal Swab  Result Value Ref Range Status   MRSA by PCR Next Gen NOT DETECTED NOT DETECTED Final    Comment: (NOTE) The GeneXpert MRSA Assay (FDA  approved for NASAL specimens only), is one component of a comprehensive MRSA colonization surveillance program. It is not intended to diagnose MRSA infection nor to guide or monitor treatment for MRSA infections. Test performance is not FDA approved in patients less than 41 years old. Performed at [facility], [place] [address], [place], [place] [address]          Radiology Studies: US Abdomen Limited RUQ (LIVER/GB)  Result Date: 04/08/2022 CLINICAL DATA:  Elevated liver function tests EXAM: ULTRASOUND ABDOMEN LIMITED RIGHT UPPER QUADRANT COMPARISON:  04/04/2022 FINDINGS: Gallbladder: Echogenic material within the gallbladder consistent with tumefactive sludge. No evidence of cholelithiasis. No gallbladder wall thickening or pericholecystic fluid. Negative sonographic Murphy sign. Common bile duct: Diameter: 3 mm Liver: No focal lesion identified. Within normal limits in parenchymal echogenicity. Portal vein is patent on color Doppler imaging with normal direction of blood flow towards the liver. Other: None. IMPRESSION: 1. Gallbladder sludge. No evidence of cholelithiasis or cholecystitis. 2. Otherwise unremarkable right upper quadrant ultrasound. Electronically Signed   By: Randa Ngo M.D.   On: 04/08/2022 15:48        Scheduled Meds:  Chlorhexidine Gluconate Cloth  6 each Topical Daily   enoxaparin (LOVENOX) injection  60 mg Subcutaneous Q24H   insulin aspart  0-9 Units Subcutaneous Q12H   metoCLOPramide  10 mg Oral TID   midodrine  10 mg Oral Q8H   sodium chloride flush  10-40 mL Intracatheter Q12H   Continuous Infusions:  sodium chloride Stopped (16/55/37 4827)   TPN CYCLIC-ADULT (ION)       LOS: 5 days    Time spent: 35 minutes    Barb Merino, MD [facility] Pager [phone]

## 2022-04-09 NOTE — Progress Notes (Signed)
PICC Removal Note:  PICC line removed from R upper arm after sterile site prepped per protocol. PICC catheter tip visualized and intact at 37cm. Vaseline gauze pressure dressing applied with transparent film. No redness, ecchymosis, edema, swelling, or drainage noted at insertion site. Suture site appeared irritated/ stretched. Pt reports it had been caught/ tugged a few times. Instructions provided on post PICC removal care, including followup notification instructions.

## 2022-04-09 NOTE — Progress Notes (Signed)
Calorie Count Note  Diet: Carbohydrate modified with snacks TID between meals Supplements: Ensure Max po TID, each supplement provides 150 kcal and 30 grams of protein.  Cyclic TPN x 12 hours provides 2189 kcal and 132 gm protein.  Breakfast 8/10: 148 kcal, 7.5 gm protein Lunch 8/10: NPO for ultrasound 3pm snack: 40 kcal, 3.5 gm protein Dinner 8/10: 210 kcal, 9 gm protein 2am snack: 130 kcal, 5 gm protein Breakfast 8/11: 115 kcal, 6 gm protein Supplements: none  Total intake (3 meals and 2 snacks): 643 kcal (32% of minimum estimated needs)  31 gm protein (26% of minimum estimated needs)  Intervention:  Continue meals, snacks, and supplements as ordered. Continue calorie count over the weekend. Continue cyclic TPN, can decrease by ~25% over the weekend.  Lucas Mallow RD, LDN, CNSC Please refer to Amion for contact information.

## 2022-04-09 NOTE — Consult Note (Signed)
[facility] for Infectious Disease  Total days of antibiotics 6/ last 3 days without               Reason for Consult: staph epi bacteremia  Referring Physician: ghimire  Principal Problem:   Shock ([place]) Active Problems:   Lupus ([place])   Fever   Viral infection   Headache   History of gastric bypass   Elevated liver enzymes    HPI: Rebecca Day is a 36 y.o. female who has hx of lupus, recently underwent gastric bypass surgery but requiring TPN via picc line as part of recovery. In the 3 days prior to admission, she started to have low grade fevers but denies having any other sick contacts. She did covid ag test at home which was negative.She didn't notice any difficulty to her picc line or surrounding erythema. Denies significant cough. No dysuria, urgency, abdominal pain. diarrhea, rash, or what she could identify that would cause fever. Occ had discomfort with sutures of picc line. On day of admit had fever at home up to 104F, with lethargy, but had some productive cough. She was started on empiric abtx but infectious work up rule out intra-abdominal infection, pneumonia, uti. Her blood cx had 1 of 4 bottles with staph epi in the setting of having indwelling picc line x 4 wk. Since it was thought to be contaminant, she was discontinued off of abtx 3 days ago, remains afebrile. ID asked to weigh in to decide if this is viral prodrome vs possible picc line infection. She does not have leukocytosis but having LFT increasing to 150-180s  Past Medical History:  Diagnosis Date   Allergy    Anxiety    Asthma    Blood transfusion without reported diagnosis    as child    Clotting disorder ([place])    blood clot after last pregnancy    Elevated blood sugar    Kidney stones    Lupus ([place])    questionable, no meds or treatment per pt   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[place])    On total parenteral nutrition (TPN)    Ovarian cyst    PMDD (premenstrual dysphoric disorder)     Allergies:  Allergies   Allergen Reactions   Penicillins Hives    Has patient had a PCN reaction causing immediate rash, facial/tongue/throat swelling, SOB or lightheadedness with hypotension: Yes Has patient had a PCN reaction causing severe rash involving mucus membranes or skin necrosis: No Has patient had a PCN reaction that required hospitalization: Yes Has patient had a PCN reaction occurring within the last 10 years: NO If all of the above answers are "NO", then may proceed with Cephalosporin use.     MEDICATIONS:  Chlorhexidine Gluconate Cloth  6 each Topical Daily   enoxaparin (LOVENOX) injection  60 mg Subcutaneous Q24H   insulin aspart  0-9 Units Subcutaneous Q12H   metoCLOPramide  10 mg Oral TID   midodrine  10 mg Oral Q8H   Ensure Max Protein  11 oz Oral TID   sodium chloride flush  10-40 mL Intracatheter Q12H    Social History   Tobacco Use   Smoking status: Never   Smokeless tobacco: Never  Vaping Use   Vaping Use: Never used  Substance Use Topics   Alcohol use: Yes    Comment: socially   Drug use: No    Family History  Problem Relation Age of Onset   Hypertension Mother    Colon polyps Mother  Cancer Father        dx'd in 11's   Colon cancer Father    Colon cancer Maternal Aunt    Hyperlipidemia Maternal Grandmother    Kidney disease Brother    Liver disease Brother    Cancer Brother    Colon cancer Other    Esophageal cancer Neg Hx    Rectal cancer Neg Hx    Stomach cancer Neg Hx    Breast cancer Neg Hx     Review of Systems -  12 point ros is negative except what is mentioned in hpi  OBJECTIVE: Temp:  [97.6 F (36.4 C)-98.4 F (36.9 C)] 97.9 F (36.6 C) (08/11 1552) Pulse Rate:  [62-111] 89 (08/11 1400) Resp:  [10-31] 20 (08/11 1059) BP: (82-112)/(49-79) 103/65 (08/11 1400) SpO2:  [90 %-100 %] 98 % (08/11 1400) Weight:  [124.9 kg] 124.9 kg (08/11 0409) Physical Exam  Constitutional:  oriented to person, place, and time. appears well-developed and  well-nourished. No distress.  HENT: [place]/AT, PERRLA, no scleral icterus Mouth/Throat: Oropharynx is clear and moist. No oropharyngeal exudate.  Cardiovascular: Normal rate, regular rhythm and normal heart sounds. Exam reveals no gallop and no friction rub.  No murmur heard.  Pulmonary/Chest: Effort normal and breath sounds normal. No respiratory distress.  has no wheezes.  Neck = supple, no nuchal rigidity HUT:MLYYT arm picc line slight tenderness Abdominal: Soft. Bowel sounds are normal.  exhibits no distension. There is no tenderness.  Lymphadenopathy: no cervical adenopathy. No axillary adenopathy Neurological: alert and oriented to person, place, and time.  Skin: Skin is warm and dry. No rash noted. No erythema.  Psychiatric: a normal mood and affect.  behavior is normal.    LABS: Results for orders placed or performed during the hospital encounter of 04/04/22 (from the past 48 hour(s))  Comprehensive metabolic panel     Status: Abnormal   Collection Time: 04/08/22  5:00 AM  Result Value Ref Range   Sodium 135 135 - 145 mmol/L   Potassium 4.3 3.5 - 5.1 mmol/L   Chloride 107 98 - 111 mmol/L   CO2 23 22 - 32 mmol/L   Glucose, Bld 99 70 - 99 mg/dL    Comment: Glucose reference range applies only to samples taken after fasting for at least 8 hours.   BUN 9 6 - 20 mg/dL   Creatinine, Ser 0.68 0.44 - 1.00 mg/dL   Calcium 9.0 8.9 - 10.3 mg/dL   Total Protein 5.9 (L) 6.5 - 8.1 g/dL   Albumin 2.7 (L) 3.5 - 5.0 g/dL   AST 129 (H) 15 - 41 U/L   ALT 125 (H) 0 - 44 U/L   Alkaline Phosphatase 74 38 - 126 U/L   Total Bilirubin 0.2 (L) 0.3 - 1.2 mg/dL   GFR, Estimated >60 >60 mL/min    Comment: (NOTE) Calculated using the CKD-EPI Creatinine Equation (2021)    Anion gap 5 5 - 15    Comment: Performed at [facility], [place] [address], [place], [place] [address]  Magnesium     Status: None   Collection Time: 04/08/22  5:00 AM  Result Value Ref Range   Magnesium 1.8 1.7 - 2.4 mg/dL     Comment: Performed at [facility] [address] [place], [place] [address]  Phosphorus     Status: None   Collection Time: 04/08/22  5:00 AM  Result Value Ref Range   Phosphorus 4.5 2.5 - 4.6 mg/dL  Comment: Performed at [facility], [place] [address] [place], [place] [address]  Triglycerides     Status: None   Collection Time: 04/08/22  5:00 AM  Result Value Ref Range   Triglycerides 89 <150 mg/dL    Comment: Performed at [facility] [address], [place], [place] [address]  CBC with Differential/Platelet     Status: Abnormal   Collection Time: 04/08/22  5:00 AM  Result Value Ref Range   WBC 6.4 4.0 - 10.5 K/uL   RBC 3.78 (L) 3.87 - 5.11 MIL/uL   Hemoglobin 11.4 (L) 12.0 - 15.0 g/dL   HCT 34.3 (L) 36.0 - 46.0 %   MCV 90.7 80.0 - 100.0 fL   MCH 30.2 26.0 - 34.0 pg   MCHC 33.2 30.0 - 36.0 g/dL   RDW 13.7 11.5 - 15.5 %   Platelets 233 150 - 400 K/uL   nRBC 0.0 0.0 - 0.2 %   Neutrophils Relative % 47 %   Neutro Abs 3.1 1.7 - 7.7 K/uL   Lymphocytes Relative 41 %   Lymphs Abs 2.6 0.7 - 4.0 K/uL   Monocytes Relative 8 %   Monocytes Absolute 0.5 0.1 - 1.0 K/uL   Eosinophils Relative 2 %   Eosinophils Absolute 0.1 0.0 - 0.5 K/uL   Basophils Relative 1 %   Basophils Absolute 0.0 0.0 - 0.1 K/uL   Immature Granulocytes 1 %   Abs Immature Granulocytes 0.06 0.00 - 0.07 K/uL    Comment: Performed at [facility] [address] [place], [place] [address]  Glucose, capillary     Status: Abnormal   Collection Time: 04/08/22  7:23 AM  Result Value Ref Range   Glucose-Capillary 115 (H) 70 - 99 mg/dL    Comment: Glucose reference range applies only to samples taken after fasting for at least 8 hours.  Glucose, capillary     Status: None   Collection Time: 04/08/22 11:41 AM  Result Value Ref Range   Glucose-Capillary 99 70 - 99 mg/dL    Comment: Glucose reference range applies only to samples taken after fasting for at least 8 hours.  Glucose, capillary      Status: None   Collection Time: 04/08/22  7:39 PM  Result Value Ref Range   Glucose-Capillary 93 70 - 99 mg/dL    Comment: Glucose reference range applies only to samples taken after fasting for at least 8 hours.  Glucose, capillary     Status: Abnormal   Collection Time: 04/09/22  4:01 AM  Result Value Ref Range   Glucose-Capillary 124 (H) 70 - 99 mg/dL    Comment: Glucose reference range applies only to samples taken after fasting for at least 8 hours.  Comprehensive metabolic panel     Status: Abnormal   Collection Time: 04/09/22  7:33 AM  Result Value Ref Range   Sodium 134 (L) 135 - 145 mmol/L   Potassium 4.6 3.5 - 5.1 mmol/L   Chloride 105 98 - 111 mmol/L   CO2 23 22 - 32 mmol/L   Glucose, Bld 115 (H) 70 - 99 mg/dL    Comment: Glucose reference range applies only to samples taken after fasting for at least 8 hours.   BUN 16 6 - 20 mg/dL   Creatinine, Ser 0.64 0.44 - 1.00 mg/dL   Calcium 9.5 8.9 - 10.3 mg/dL   Total Protein 6.4 (L) 6.5 - 8.1 g/dL   Albumin 3.1 (L) 3.5 - 5.0 g/dL  AST 143 (H) 15 - 41 U/L   ALT 188 (H) 0 - 44 U/L   Alkaline Phosphatase 89 38 - 126 U/L   Total Bilirubin 0.3 0.3 - 1.2 mg/dL   GFR, Estimated >60 >60 mL/min    Comment: (NOTE) Calculated using the CKD-EPI Creatinine Equation (2021)    Anion gap 6 5 - 15    Comment: Performed at [facility] [address] [place], [place] [address]  Phosphorus     Status: None   Collection Time: 04/09/22  7:33 AM  Result Value Ref Range   Phosphorus 4.1 2.5 - 4.6 mg/dL    Comment: Performed at [facility] [address] [place], [place] [address]  Magnesium     Status: None   Collection Time: 04/09/22  7:33 AM  Result Value Ref Range   Magnesium 1.9 1.7 - 2.4 mg/dL    Comment: Performed at [facility] [address] [place], [place] [address]  Glucose, capillary     Status: Abnormal   Collection Time: 04/09/22  9:51 AM  Result Value Ref Range   Glucose-Capillary 111 (H) 70 - 99  mg/dL    Comment: Glucose reference range applies only to samples taken after fasting for at least 8 hours.    MICRO: reviewed IMAGING: US Abdomen Limited RUQ (LIVER/GB)  Result Date: 04/08/2022 CLINICAL DATA:  Elevated liver function tests EXAM: ULTRASOUND ABDOMEN LIMITED RIGHT UPPER QUADRANT COMPARISON:  04/04/2022 FINDINGS: Gallbladder: Echogenic material within the gallbladder consistent with tumefactive sludge. No evidence of cholelithiasis. No gallbladder wall thickening or pericholecystic fluid. Negative sonographic Murphy sign. Common bile duct: Diameter: 3 mm Liver: No focal lesion identified. Within normal limits in parenchymal echogenicity. Portal vein is patent on color Doppler imaging with normal direction of blood flow towards the liver. Other: None. IMPRESSION: 1. Gallbladder sludge. No evidence of cholelithiasis or cholecystitis. 2. Otherwise unremarkable right upper quadrant ultrasound. Electronically Signed   By: Randa Ngo M.D.   On: 04/08/2022 15:48     Assessment/Plan:   Staph epi bacteremia = unclear if it is the cause of what brought her into the hospital, however, having some discomfort at site. Presently has partial treatment since she has received iv abtx. Recommend to remove picc line. Give line holiday before new line placed. Repeat blood cx are pending  Transaminitis = recommend to check Hepatitis panel but suspect it could be elevated in the setting of tpn use. Continue to monitor to see that it trends down.  Dr [place] Bali available for questions

## 2022-04-10 DIAGNOSIS — R579 Shock, unspecified: Secondary | ICD-10-CM | POA: Diagnosis not present

## 2022-04-10 LAB — HEPATIC FUNCTION PANEL
ALT: 187 U/L — ABNORMAL HIGH (ref 0–44)
AST: 126 U/L — ABNORMAL HIGH (ref 15–41)
Albumin: 3.1 g/dL — ABNORMAL LOW (ref 3.5–5.0)
Alkaline Phosphatase: 87 U/L (ref 38–126)
Bilirubin, Direct: <0.1 mg/dL (ref 0.0–0.2)
Total Bilirubin: 0.4 mg/dL (ref 0.3–1.2)
Total Protein: 6.4 g/dL — ABNORMAL LOW (ref 6.5–8.1)

## 2022-04-10 LAB — MAGNESIUM: Magnesium: 1.9 mg/dL (ref 1.7–2.4)

## 2022-04-10 LAB — PHOSPHORUS: Phosphorus: 4.4 mg/dL (ref 2.5–4.6)

## 2022-04-10 LAB — GLUCOSE, CAPILLARY
Glucose-Capillary: 91 mg/dL (ref 70–99)
Glucose-Capillary: 77 mg/dL (ref 70–99)

## 2022-04-10 LAB — BASIC METABOLIC PANEL
Anion gap: 9 (ref 5–15)
BUN: 15 mg/dL (ref 6–20)
CO2: 22 mmol/L (ref 22–32)
Calcium: 9.3 mg/dL (ref 8.9–10.3)
Chloride: 106 mmol/L (ref 98–111)
Creatinine, Ser: 0.62 mg/dL (ref 0.44–1.00)
GFR, Estimated: >60 mL/min (ref >60)
Glucose, Bld: 86 mg/dL (ref 70–99)
Potassium: 4.2 mmol/L (ref 3.5–5.1)
Sodium: 137 mmol/L (ref 135–145)

## 2022-04-10 MED ORDER — VANCOMYCIN HCL 1500 MG/300ML IV SOLN
1500.0000 mg | Freq: Two times a day (BID) | INTRAVENOUS | Status: DC
  Administered 2022-04-10: 1500 mg via INTRAVENOUS
  Filled 2022-04-10: qty 300

## 2022-04-10 MED ORDER — DEXTROSE-NACL 5-0.9 % IV SOLN: INTRAVENOUS | Status: DC

## 2022-04-10 MED ORDER — ENOXAPARIN SODIUM 60 MG/0.6ML IJ SOSY
60.0000 mg | PREFILLED_SYRINGE | Freq: Every day | INTRAMUSCULAR | Status: DC
Start: 2022-04-10 — End: 2022-04-15
  Administered 2022-04-10 – 2022-04-15 (×6): 60 mg via SUBCUTANEOUS
  Filled 2022-04-10 (×6): qty 0.6

## 2022-04-10 MED ORDER — VANCOMYCIN HCL 1750 MG/350ML IV SOLN
1750.0000 mg | Freq: Two times a day (BID) | INTRAVENOUS | Status: DC
Start: 2022-04-10 — End: 2022-04-10

## 2022-04-10 MED ORDER — VANCOMYCIN HCL 10 G IV SOLR
2500.0000 mg | Freq: Once | INTRAVENOUS | Status: AC
  Administered 2022-04-10: 2500 mg via INTRAVENOUS
  Filled 2022-04-10: qty 2500

## 2022-04-10 MED ORDER — DIPHENHYDRAMINE HCL 25 MG PO CAPS
25.0000 mg | ORAL_CAPSULE | Freq: Four times a day (QID) | ORAL | Status: DC | PRN
Start: 2022-04-10 — End: 2022-04-15
  Administered 2022-04-10 – 2022-04-11 (×3): 25 mg via ORAL
  Filled 2022-04-10 (×3): qty 1

## 2022-04-10 MED ORDER — TRAVASOL 10 % IV SOLN
INTRAVENOUS | Status: DC
  Filled 2022-04-10: qty 1320

## 2022-04-10 NOTE — Plan of Care (Signed)
  Problem: Education: Goal: Knowledge of General Education information will improve Description: Including pain rating scale, medication(s)/side effects and non-pharmacologic comfort measures Outcome: Progressing   Problem: Health Behavior/Discharge Planning: Goal: Ability to manage health-related needs will improve Outcome: Progressing   Problem: Clinical Measurements: Goal: Will remain free from infection Outcome: Progressing   

## 2022-04-10 NOTE — Progress Notes (Signed)
PHARMACY - TOTAL PARENTERAL NUTRITION CONSULT NOTE  Indication:  Nausea/vomiting post gastric bypass surgery  Patient Measurements: Height: '5\' 8"'  (172.7 cm) Weight: 124.9 kg (275 lb 5.7 oz) IBW/kg (Calculated) : 63.9 TPN AdjBW (KG): 79.9 Body mass index is 41.87 kg/m. Usual Weight: 127kg   Assessment:  11 YOF with history of gastric sleeve bypass in June of 2023 on TPN outpatient. Patient admitted with fever, nausea and vomiting found to have sepsis from viral infection.  Patient has been on TPN and a regular diet with minimal intake.  Pharmacy consulted to continue TPN from PTA.  Glucose / Insulin: no hx DM - CBGs < 180 Electrolytes: all WNL Renal: SCr 0.64, BUN WNL Hepatic: AST/ALT trended up to 143/188, alk phos / tbili / TG WNL Intake / Output; MIVF:  UOP 1.2 ml/kg/hr, LBM 8/8 GI Imaging:  8/6 CT - normal  8/10 KUB: No evidence of cholelithiasis or cholecystitis GI Surgeries / Procedures:  01/2022 - gastric bypass   Central access: single lumen PICC place 03/12/22 PTA TPN start date: con't from home (started 04/05/2022, inpatient)  Nutritional Goals:  RD Assessment: 2000-2200 kCal, 120-140g protein, 2-2.2L per day  Home TPN per [facility]: 2369m cycled over 12 hrs:  230g CHO, 92g AA, 46g ILE (volume recently increase outpt d/t dehydration) Sodium chloride: 479m Sodium acetate: 9555mSodium phosphate: 91m62mCL: 70mE88mag sulfate: 10mEq64mlcium gluc: 10mEq 70mrrent Nutrition:  TPN - off 8/12 8/10 CMD - PO intake improving, RD conducting calorie count  Plan:  PICC removed due to bacteremia; therefore, patient is off TPN Please re-consult Pharmacy if TPN is to be resumed D/C TPN labs and nursing care orders  Previous TPN formula:  55g/L AA, 13% CHO and 25g/L ILE Cycle TPN, 2400mL ov43m2 hrs (109- 218 ml/hr, GIR 1.9- 3.8 mg/kg/min), provides 132g AA and 2189 kCal, meeting 100% of needs Electrolytes in TPN: Na 125 mEq/L, Mg 10 mEq/L, K 35 mEq/L, Ca 2 mEq/L, Phos  10 mmol/L, Cl:Ac 1:1  Shaniece Bussa D. Shalik Sanfilippo, PhMina Marble, BCPS, BCCCP 8/[place]23, 8:35 AM

## 2022-04-10 NOTE — Progress Notes (Signed)
PROGRESS NOTE    Rebecca Day  XFG:[id number] DOB: [birth year] DOA: 04/04/2022 PCP: [name], FNP    Brief Narrative:  Rebecca Day, is a 36 y.o. female, who presented to the [facility] ED with a chief complaint of fever.  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   Prior to arrival, patient denies sick contacts.  Recently returned to work at office job.  Developed nausea and vomiting on Friday 8/4, developed generalized weakness, family having to help at home with TPN.  Patient developed a fever over 103 at home and EMS was called.   ED course was notable for temperature of 104.4 Fahrenheit, no leukocytosis, no lactic acid elevation.  CTA chest negative for PE, bilateral lower lobe suspicious for infection.  Patient presenting with PICC line for home TPN.  She became hypotensive with a blood pressure of 79/33.  Levophed was started.  She was given 4 L of IV fluid.  Blood cultures and urine cultures were collected.  She was started on vancomycin and cefepime.  Endorses headache.  Denies neck stiffness or pain, no pain on the elevation, no abdominal pain, no light sensitivity.  GCS 15.  On room air.   PCCM was consulted for admission   8/6 presented to [name].  Blood cultures >, vancomycin/cefepime >, CTh negative, CT PE negative for PE, CT abdomen negative for obstruction 8/8 had to go back on Levophed later on in the day 8/9 off Levophed since 2 AM   Initially on antibiotics then discontinued. Blood cultures with Staph epidermidis.  Suspected contamination.  Her episode was suspected to be viral syndrome. 8/11, reevaluated source of infection after receiving transfer from ICU.  PICC line removed.  Repeat blood cultures drawn and is still growing Staph epidermidis. 8/12, ID consulted.  Started on vancomycin.  Repeat blood cultures 8/13.     Assessment & Plan:   Septic shock present on  admission. Hemodynamically improving.  No localizing source of infection. Blood cultures on admission 1 out of 2 Staph epidermidis.  Kept off antibiotics. PICC line removed 8/11 Blood cultures 8/11, growing staph.  Started on vancomycin.  Echocardiogram on admission was without any vegetations.  Repeat blood cultures tomorrow. Suspected partially treated/low-grade infection of PICC line. Blood pressure improving.  Continue midodrine.  Maintenance IV fluids.  Gastric bypass, intolerance to oral intake on TPN support. Followed by dietitian.  Gradually introducing oral intake.  TPN on hold as PICC line is taken off.   Given maintenance fluid.  Elevated LFTs: Transaminitis probably related to hypotension.  Right upper quadrant ultrasound was normal.  Transaminases elevated but is stable.    DVT prophylaxis:   Lovenox subcu   Code Status: Full code Family Communication: None at the bedside Disposition Plan: Status is: Inpatient Remains inpatient appropriate because: Bacteremia.  On antibiotics.     Consultants:  Critical care Infectious disease  Procedures:  None  Antimicrobials:  None   Subjective: Patient seen and examined.  Still gets some headache on standing but no drop in orthostatic blood pressures.  Afebrile.  Denies any nausea or vomiting.  Trying to eat some meal.  PICC line out.  Objective: Vitals:   04/10/22 0006 04/10/22 0205 04/10/22 0422 04/10/22 0750  BP: (!) 97/53 (!) 93/56 96/66 101/63  Pulse: 79 72 85 87  Resp: (!) 25 (!) '23 20 15  '$ Temp: 97.9 F (36.6 C) 98.1 F (36.7 C) 98 F (36.7 C) 97.7  F (36.5 C)  TempSrc: Oral Oral Oral Oral  SpO2:  95% 99% 98%  Weight:      Height:        Intake/Output Summary (Last 24 hours) at 04/10/2022 1343 Last data filed at 04/10/2022 0948 Gross per 24 hour  Intake 60 ml  Output --  Net 60 ml   Filed Weights   04/07/22 0500 04/08/22 0500 04/09/22 0409  Weight: 127.9 kg 127.1 kg 124.9 kg     Examination:  General exam: Appears calm and comfortable  Respiratory system: Clear to auscultation. Respiratory effort normal.  On room air. Cardiovascular system: S1 & S2 heard, RRR. No pedal edema. Gastrointestinal system: Abdomen is nondistended, soft and nontender. No organomegaly or masses felt. Normal bowel sounds heard.  His scars are nontender and healthy. Central nervous system: Alert and oriented. No focal neurological deficits. Extremities: Symmetric 5 x 5 power. Skin: No rashes, lesions or ulcers Psychiatry: Judgement and insight appear normal. Mood & affect appropriate.     Data Reviewed: I have personally reviewed following labs and imaging studies  CBC: Recent Labs  Lab 04/04/22 1530 04/04/22 1553 04/05/22 0115 04/08/22 0500  WBC 6.5  --  5.7 6.4  NEUTROABS 5.2  --   --  3.1  HGB 13.3 13.9 10.7* 11.4*  HCT 39.3 41.0 32.5* 34.3*  MCV 88.1  --  90.5 90.7  PLT 223  --  194 235   Basic Metabolic Panel: Recent Labs  Lab 04/05/22 0115 04/06/22 0826 04/08/22 0500 04/09/22 0733 04/10/22 0122  NA 139 140 135 134* 137  K 3.6 4.1 4.3 4.6 4.2  CL 110 110 107 105 106  CO2 20* '22 23 23 22  '$ GLUCOSE 93 122* 99 115* 86  BUN '8 7 9 16 15  '$ CREATININE 0.64 0.67 0.68 0.64 0.62  CALCIUM 8.1* 8.6* 9.0 9.5 9.3  MG 1.7 1.8 1.8 1.9 1.9  PHOS 3.9 3.4 4.5 4.1 4.4   GFR: Estimated Creatinine Clearance: 139.4 mL/min (by C-G formula based on SCr of 0.62 mg/dL). Liver Function Tests: Recent Labs  Lab 04/04/22 1530 04/06/22 0826 04/08/22 0500 04/09/22 0733 04/10/22 0122  AST 37 32 129* 143* 126*  ALT 54* 45* 125* 188* 187*  ALKPHOS 92 71 74 89 87  BILITOT 0.9 0.3 0.2* 0.3 0.4  PROT 6.9 5.8* 5.9* 6.4* 6.4*  ALBUMIN 3.2* 2.5* 2.7* 3.1* 3.1*   No results for input(s): "LIPASE", "AMYLASE" in the last 168 hours. No results for input(s): "AMMONIA" in the last 168 hours. Coagulation Profile: No results for input(s): "INR", "PROTIME" in the last 168 hours. Cardiac  Enzymes: No results for input(s): "CKTOTAL", "CKMB", "CKMBINDEX", "TROPONINI" in the last 168 hours. BNP (last 3 results) No results for input(s): "PROBNP" in the last 8760 hours. HbA1C: No results for input(s): "HGBA1C" in the last 72 hours. CBG: Recent Labs  Lab 04/08/22 1939 04/09/22 0401 04/09/22 0951 04/09/22 2050 04/10/22 0748  GLUCAP 93 124* 111* 83 91   Lipid Profile: Recent Labs    04/08/22 0500  TRIG 89   Thyroid Function Tests: No results for input(s): "TSH", "T4TOTAL", "FREET4", "T3FREE", "THYROIDAB" in the last 72 hours. Anemia Panel: No results for input(s): "VITAMINB12", "FOLATE", "FERRITIN", "TIBC", "IRON", "RETICCTPCT" in the last 72 hours. Sepsis Labs: Recent Labs  Lab 04/04/22 1530 04/04/22 1930 04/05/22 0115 04/06/22 0826 04/07/22 0116  PROCALCITON 0.66  --  1.28 0.55  --   LATICACIDVEN 1.7 0.9  --   --  0.9  Recent Results (from the past 240 hour(s))  Blood culture (routine x 2)     Status: Abnormal   Collection Time: 04/04/22  3:18 PM   Specimen: BLOOD  Result Value Ref Range Status   Specimen Description BLOOD LEFT ANTECUBITAL  Final   Special Requests   Final    BOTTLES DRAWN AEROBIC AND ANAEROBIC Blood Culture results may not be optimal due to an inadequate volume of blood received in culture bottles   Culture  Setup Time   Final    GRAM POSITIVE COCCI IN CLUSTERS ANAEROBIC BOTTLE ONLY PHARMD A. PAYTES [id number] '@1352'$  FH    Culture (A)  Final    STAPHYLOCOCCUS EPIDERMIDIS THE SIGNIFICANCE OF ISOLATING THIS ORGANISM FROM A SINGLE SET OF BLOOD CULTURES WHEN MULTIPLE SETS ARE DRAWN IS UNCERTAIN. PLEASE NOTIFY THE MICROBIOLOGY DEPARTMENT WITHIN ONE WEEK IF SPECIATION AND SENSITIVITIES ARE REQUIRED. Performed at [facility], [place] [address] [place], [place] [address]    Report Status 04/07/2022 FINAL  Final  Blood Culture ID Panel (Reflexed)     Status: Abnormal   Collection Time: 04/04/22  3:18 PM  Result Value Ref Range Status    Enterococcus faecalis NOT DETECTED NOT DETECTED Final   Enterococcus Faecium NOT DETECTED NOT DETECTED Final   Listeria monocytogenes NOT DETECTED NOT DETECTED Final   Staphylococcus species DETECTED (A) NOT DETECTED Final    Comment: CRITICAL RESULT CALLED TO, READ BACK BY AND VERIFIED WITH: PHARMD A. PAYTES [id number] '@1352'$  FH    Staphylococcus aureus (BCID) NOT DETECTED NOT DETECTED Final   Staphylococcus epidermidis DETECTED (A) NOT DETECTED Final    Comment: CRITICAL RESULT CALLED TO, READ BACK BY AND VERIFIED WITH: PHARMD A. PAYTES [id number] '@1352'$  FH    Staphylococcus lugdunensis NOT DETECTED NOT DETECTED Final   Streptococcus species NOT DETECTED NOT DETECTED Final   Streptococcus agalactiae NOT DETECTED NOT DETECTED Final   Streptococcus pneumoniae NOT DETECTED NOT DETECTED Final   Streptococcus pyogenes NOT DETECTED NOT DETECTED Final   A.calcoaceticus-baumannii NOT DETECTED NOT DETECTED Final   Bacteroides fragilis NOT DETECTED NOT DETECTED Final   Enterobacterales NOT DETECTED NOT DETECTED Final   Enterobacter cloacae complex NOT DETECTED NOT DETECTED Final   Escherichia coli NOT DETECTED NOT DETECTED Final   Klebsiella aerogenes NOT DETECTED NOT DETECTED Final   Klebsiella oxytoca NOT DETECTED NOT DETECTED Final   Klebsiella pneumoniae NOT DETECTED NOT DETECTED Final   Proteus species NOT DETECTED NOT DETECTED Final   Salmonella species NOT DETECTED NOT DETECTED Final   Serratia marcescens NOT DETECTED NOT DETECTED Final   Haemophilus influenzae NOT DETECTED NOT DETECTED Final   Neisseria meningitidis NOT DETECTED NOT DETECTED Final   Pseudomonas aeruginosa NOT DETECTED NOT DETECTED Final   Stenotrophomonas maltophilia NOT DETECTED NOT DETECTED Final   Candida albicans NOT DETECTED NOT DETECTED Final   Candida auris NOT DETECTED NOT DETECTED Final   Candida glabrata NOT DETECTED NOT DETECTED Final   Candida krusei NOT DETECTED NOT DETECTED Final   Candida parapsilosis NOT  DETECTED NOT DETECTED Final   Candida tropicalis NOT DETECTED NOT DETECTED Final   Cryptococcus neoformans/gattii NOT DETECTED NOT DETECTED Final   Methicillin resistance mecA/C NOT DETECTED NOT DETECTED Final    Comment: Performed at [facility], [address], [place], [place] [address]  Blood culture (routine x 2)     Status: None   Collection Time: 04/04/22  3:23 PM   Specimen: BLOOD  Result Value Ref Range Status   Specimen  Description BLOOD LEFT ANTECUBITAL  Final   Special Requests   Final    BOTTLES DRAWN AEROBIC AND ANAEROBIC Blood Culture adequate volume   Culture   Final    NO GROWTH 5 DAYS Performed at [facility], [address], [place], [place] [address]    Report Status 04/09/2022 FINAL  Final  Resp Panel by RT-PCR (Flu A&B, Covid) Anterior Nasal Swab     Status: None   Collection Time: 04/04/22  3:23 PM   Specimen: Anterior Nasal Swab  Result Value Ref Range Status   SARS Coronavirus 2 by RT PCR NEGATIVE NEGATIVE Final    Comment: (NOTE) SARS-CoV-2 target nucleic acids are NOT DETECTED.  The SARS-CoV-2 RNA is generally detectable in upper respiratory specimens during the acute phase of infection. The lowest concentration of SARS-CoV-2 viral copies this assay can detect is 138 copies/mL. A negative result does not preclude SARS-Cov-2 infection and should not be used as the sole basis for treatment or other patient management decisions. A negative result may occur with  improper specimen collection/handling, submission of specimen other than nasopharyngeal swab, presence of viral mutation(s) within the areas targeted by this assay, and inadequate number of viral copies(<138 copies/mL). A negative result must be combined with clinical observations, patient history, and epidemiological information. The expected result is Negative.  Fact Sheet for Patients:  EntrepreneurPulse.com.au  Fact Sheet for Healthcare Providers:   IncredibleEmployment.be  This test is no t yet approved or cleared by the [place] FDA and  has been authorized for detection and/or diagnosis of SARS-CoV-2 by FDA under an Emergency Use Authorization (EUA). This EUA will remain  in effect (meaning this test can be used) for the duration of the COVID-19 declaration under Section 564(b)(1) of the Act, 21 U.S.C.section 360bbb-3(b)(1), unless the authorization is terminated  or revoked sooner.       Influenza A by PCR NEGATIVE NEGATIVE Final   Influenza B by PCR NEGATIVE NEGATIVE Final    Comment: (NOTE) The Xpert Xpress SARS-CoV-2/FLU/RSV plus assay is intended as an aid in the diagnosis of influenza from Nasopharyngeal swab specimens and should not be used as a sole basis for treatment. Nasal washings and aspirates are unacceptable for Xpert Xpress SARS-CoV-2/FLU/RSV testing.  Fact Sheet for Patients: EntrepreneurPulse.com.au  Fact Sheet for Healthcare Providers: IncredibleEmployment.be  This test is not yet approved or cleared by the [place] FDA and has been authorized for detection and/or diagnosis of SARS-CoV-2 by FDA under an Emergency Use Authorization (EUA). This EUA will remain in effect (meaning this test can be used) for the duration of the COVID-19 declaration under Section 564(b)(1) of the Act, 21 U.S.C. section 360bbb-3(b)(1), unless the authorization is terminated or revoked.  Performed at [facility], [place] [address], [place], [place] [address]   Urine Culture     Status: None   Collection Time: 04/04/22  6:47 PM   Specimen: Urine, Clean Catch  Result Value Ref Range Status   Specimen Description URINE, CLEAN CATCH  Final   Special Requests NONE  Final   Culture   Final    NO GROWTH Performed at [facility], [place] [address], [place], [place] [address]    Report Status 04/06/2022 FINAL  Final  MRSA Next Gen by PCR, Nasal      Status: None   Collection Time: 04/04/22  9:45 PM   Specimen: Nasal Mucosa; Nasal Swab  Result Value Ref Range Status   MRSA by PCR  Next Gen NOT DETECTED NOT DETECTED Final    Comment: (NOTE) The GeneXpert MRSA Assay (FDA approved for NASAL specimens only), is one component of a comprehensive MRSA colonization surveillance program. It is not intended to diagnose MRSA infection nor to guide or monitor treatment for MRSA infections. Test performance is not FDA approved in patients less than 68 years old. Performed at [facility], [place] [address], [place], [place] [address]   Culture, blood (Routine X 2) w Reflex to ID Panel     Status: None (Preliminary result)   Collection Time: 04/09/22 11:25 AM   Specimen: BLOOD  Result Value Ref Range Status   Specimen Description BLOOD BLOOD LEFT WRIST  Final   Special Requests   Final    BOTTLES DRAWN AEROBIC AND ANAEROBIC Blood Culture adequate volume   Culture  Setup Time   Final    GRAM POSITIVE COCCI IN CLUSTERS IN BOTH AEROBIC AND ANAEROBIC BOTTLES CRITICAL VALUE NOTED.  VALUE IS CONSISTENT WITH PREVIOUSLY REPORTED AND CALLED VALUE. Performed at [facility], [place] [address], [place], [place] [address]    Culture GRAM POSITIVE COCCI  Final   Report Status PENDING  Incomplete  Culture, blood (Routine X 2) w Reflex to ID Panel     Status: None (Preliminary result)   Collection Time: 04/09/22 11:46 AM   Specimen: BLOOD  Result Value Ref Range Status   Specimen Description BLOOD BLOOD LEFT HAND  Final   Special Requests   Final    BOTTLES DRAWN AEROBIC AND ANAEROBIC Blood Culture results may not be optimal due to an inadequate volume of blood received in culture bottles   Culture  Setup Time   Final    GRAM POSITIVE COCCI IN CLUSTERS IN BOTH AEROBIC AND ANAEROBIC BOTTLES CRITICAL RESULT CALLED TO, READ BACK BY AND VERIFIED WITH:  C/ PHARMD JAMES L. 04/10/22 0650 A. LAFRANCE Performed at [facility], [place] [address]  [place], [place] [address]    Culture GRAM POSITIVE COCCI  Final   Report Status PENDING  Incomplete         Radiology Studies: US Abdomen Limited RUQ (LIVER/GB)  Result Date: 04/08/2022 CLINICAL DATA:  Elevated liver function tests EXAM: ULTRASOUND ABDOMEN LIMITED RIGHT UPPER QUADRANT COMPARISON:  04/04/2022 FINDINGS: Gallbladder: Echogenic material within the gallbladder consistent with tumefactive sludge. No evidence of cholelithiasis. No gallbladder wall thickening or pericholecystic fluid. Negative sonographic Murphy sign. Common bile duct: Diameter: 3 mm Liver: No focal lesion identified. Within normal limits in parenchymal echogenicity. Portal vein is patent on color Doppler imaging with normal direction of blood flow towards the liver. Other: None. IMPRESSION: 1. Gallbladder sludge. No evidence of cholelithiasis or cholecystitis. 2. Otherwise unremarkable right upper quadrant ultrasound. Electronically Signed   By: Randa Ngo M.D.   On: 04/08/2022 15:48        Scheduled Meds:  Chlorhexidine Gluconate Cloth  6 each Topical Daily   enoxaparin (LOVENOX) injection  60 mg Subcutaneous Daily   insulin aspart  0-9 Units Subcutaneous Q12H   metoCLOPramide  10 mg Oral TID   midodrine  10 mg Oral Q8H   Ensure Max Protein  11 oz Oral TID   sodium chloride flush  10-40 mL Intracatheter Q12H   Continuous Infusions:  sodium chloride Stopped (04/06/22 1005)   dextrose 5 % and 0.9% NaCl 75 mL/hr at 70/17/79 3903   TPN CYCLIC-ADULT (ION)     vancomycin 2,500 mg (04/10/22 1146)   vancomycin  LOS: 6 days    Time spent: 35 minutes    Barb Merino, MD [facility] Pager [phone]

## 2022-04-10 NOTE — Progress Notes (Addendum)
Pharmacy Antibiotic Note  Rebecca Day is a 36 y.o. female admitted on 04/04/2022 with staph epidermidis bacteremia. Infection was partially treated with IV abx, however BCx remain positive for GPC in clusters.  Pharmacy has been consulted for vancomycin dosing.  Plan: Administer Vancomycin '2500mg'$  IV once Then initiate Vancomycin '1500mg'$  Q12h (eAUC 545; Vd 0.5; IBW; Scr 0.8), goal AUC 450-550 Monitor Renal function and s/s of infection daily  Height: '5\' 8"'$  (172.7 cm) Weight: 124.9 kg (275 lb 5.7 oz) IBW/kg (Calculated) : 63.9  Temp (24hrs), Avg:97.9 F (36.6 C), Min:97.6 F (36.4 C), Max:98.1 F (36.7 C)  Recent Labs  Lab 04/04/22 1530 04/04/22 1553 04/04/22 1930 04/05/22 0115 04/06/22 0826 04/07/22 0116 04/08/22 0500 04/09/22 0733 04/10/22 0122  WBC 6.5  --   --  5.7  --   --  6.4  --   --   CREATININE 0.94   < >  --  0.64 0.67  --  0.68 0.64 0.62  LATICACIDVEN 1.7  --  0.9  --   --  0.9  --   --   --    < > = values in this interval not displayed.    Estimated Creatinine Clearance: 139.4 mL/min (by C-G formula based on SCr of 0.62 mg/dL).    Allergies  Allergen Reactions   Penicillins Hives    Has patient had a PCN reaction causing immediate rash, facial/tongue/throat swelling, SOB or lightheadedness with hypotension: Yes Has patient had a PCN reaction causing severe rash involving mucus membranes or skin necrosis: No Has patient had a PCN reaction that required hospitalization: Yes Has patient had a PCN reaction occurring within the last 10 years: NO If all of the above answers are "NO", then may proceed with Cephalosporin use.    Antimicrobials this admission:  Vancomycin 8/6>>8/7, 8/12 >> Ceftriaxone 8/7>>8/8 Cefepime 8/6>>8/7 Metronidazole 8/6 x1  Dose adjustments this admission: n/a  Microbiology results: 8/6 MRSA PCR - negative 8/6 BCx - 1/2 Staph epi 8/6 UCx - negative 8/11 BCx - GPC clusters  Thank you for allowing pharmacy to be a part of this  patient's care.  Titus Dubin, PharmD PGY1 Pharmacy Resident 04/10/2022 9:04 AM

## 2022-04-10 NOTE — Progress Notes (Signed)
ID Brief Note    Blood cx 8/8 1/2 sets staph epidermidis  PICC for TPN removed 8/11 Blood cx 8/11 both sets GPC in clusters, consistent with previously reported value ( drawn off abtx )  8/11 Hepatitis B surface ag NR  TTE 04/05/22 unremarkable    Concerns for partially treated staph epi bacteremia in the setting of PICC  Repeat 2 sets of blood cx on 8/13 Added on hepatic function panel  Vancomycin started, pharmacy to dose  Rosiland Oz, MD Infectious Disease Physician [facility] [address] [place], [place] [address] Phone: [phone]  Fax: [phone]

## 2022-04-10 NOTE — Plan of Care (Signed)

## 2022-04-10 NOTE — Plan of Care (Signed)

## 2022-04-11 DIAGNOSIS — R7881 Bacteremia: Secondary | ICD-10-CM

## 2022-04-11 DIAGNOSIS — R579 Shock, unspecified: Secondary | ICD-10-CM | POA: Diagnosis not present

## 2022-04-11 HISTORY — DX: Bacteremia: R78.81

## 2022-04-11 LAB — CBC WITH DIFFERENTIAL/PLATELET
Abs Immature Granulocytes: 0.05 10*3/uL (ref 0.00–0.07)
Basophils Absolute: 0.1 10*3/uL (ref 0.0–0.1)
Basophils Relative: 1 %
Eosinophils Absolute: 0.1 10*3/uL (ref 0.0–0.5)
Eosinophils Relative: 1 %
HCT: 34.3 % — ABNORMAL LOW (ref 36.0–46.0)
Hemoglobin: 11.5 g/dL — ABNORMAL LOW (ref 12.0–15.0)
Immature Granulocytes: 1 %
Lymphocytes Relative: 34 %
Lymphs Abs: 3 10*3/uL (ref 0.7–4.0)
MCH: 29.9 pg (ref 26.0–34.0)
MCHC: 33.5 g/dL (ref 30.0–36.0)
MCV: 89.3 fL (ref 80.0–100.0)
Monocytes Absolute: 0.7 10*3/uL (ref 0.1–1.0)
Monocytes Relative: 9 %
Neutro Abs: 4.7 10*3/uL (ref 1.7–7.7)
Neutrophils Relative %: 54 %
Platelets: 270 10*3/uL (ref 150–400)
RBC: 3.84 MIL/uL — ABNORMAL LOW (ref 3.87–5.11)
RDW: 13.3 % (ref 11.5–15.5)
WBC: 8.7 10*3/uL (ref 4.0–10.5)
nRBC: 0 % (ref 0.0–0.2)

## 2022-04-11 LAB — COMPREHENSIVE METABOLIC PANEL
ALT: 145 U/L — ABNORMAL HIGH (ref 0–44)
AST: 78 U/L — ABNORMAL HIGH (ref 15–41)
Albumin: 2.9 g/dL — ABNORMAL LOW (ref 3.5–5.0)
Alkaline Phosphatase: 83 U/L (ref 38–126)
Anion gap: 8 (ref 5–15)
BUN: 10 mg/dL (ref 6–20)
CO2: 20 mmol/L — ABNORMAL LOW (ref 22–32)
Calcium: 8.9 mg/dL (ref 8.9–10.3)
Chloride: 108 mmol/L (ref 98–111)
Creatinine, Ser: 0.75 mg/dL (ref 0.44–1.00)
GFR, Estimated: >60 mL/min (ref >60)
Glucose, Bld: 87 mg/dL (ref 70–99)
Potassium: 3.9 mmol/L (ref 3.5–5.1)
Sodium: 136 mmol/L (ref 135–145)
Total Bilirubin: 0.4 mg/dL (ref 0.3–1.2)
Total Protein: 6.1 g/dL — ABNORMAL LOW (ref 6.5–8.1)

## 2022-04-11 LAB — GLUCOSE, CAPILLARY
Glucose-Capillary: 72 mg/dL (ref 70–99)
Glucose-Capillary: 73 mg/dL (ref 70–99)
Glucose-Capillary: 89 mg/dL (ref 70–99)

## 2022-04-11 LAB — MAGNESIUM: Magnesium: 1.9 mg/dL (ref 1.7–2.4)

## 2022-04-11 LAB — PHOSPHORUS: Phosphorus: 4.2 mg/dL (ref 2.5–4.6)

## 2022-04-11 MED ORDER — SODIUM CHLORIDE 0.9 % IV SOLN
1200.0000 mg | Freq: Every day | INTRAVENOUS | Status: DC
  Administered 2022-04-11: 1200 mg via INTRAVENOUS
  Filled 2022-04-11 (×2): qty 24

## 2022-04-11 MED ORDER — LINEZOLID 600 MG/300ML IV SOLN
600.0000 mg | Freq: Two times a day (BID) | INTRAVENOUS | Status: DC
  Administered 2022-04-11 (×2): 600 mg via INTRAVENOUS
  Filled 2022-04-11 (×3): qty 300

## 2022-04-11 NOTE — Progress Notes (Signed)
PROGRESS NOTE    Rebecca Day  UMP:[id number] DOB: [birth year] DOA: 04/04/2022 PCP: [name], FNP    Brief Narrative:  Rebecca Day, is a 36 y.o. female, who presented to the [facility] ED with a chief complaint of fever.  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   Prior to arrival, patient denies sick contacts.  Recently returned to work at office job.  Developed nausea and vomiting on Friday 8/4, developed generalized weakness, family having to help at home with TPN.  Patient developed a fever over 103 at home and EMS was called.   ED course was notable for temperature of 104.4 Fahrenheit, no leukocytosis, no lactic acid elevation.  CTA chest negative for PE, bilateral lower lobe suspicious for infection.  Patient presenting with PICC line for home TPN.  She became hypotensive with a blood pressure of 79/33.  Levophed was started.  She was given 4 L of IV fluid.  Blood cultures and urine cultures were collected.  She was started on vancomycin and cefepime.  Endorses headache.  Denies neck stiffness or pain, no pain on the elevation, no abdominal pain, no light sensitivity.  GCS 15.  On room air.   PCCM was consulted for admission   8/6 [name].  Blood cultures >, vancomycin/cefepime >, CTh negative, CT PE negative for PE, CT abdomen negative for obstruction 8/8 had to go back on Levophed later on in the day 8/9 off Levophed since 2 AM   Initially on antibiotics then discontinued. Blood cultures with Staph epidermidis.  Suspected contamination.  Her episode was suspected to be viral syndrome. 8/11, reevaluated source of infection after receiving transfer from ICU.  PICC line removed.  Repeat blood cultures drawn and is still growing Staph epidermidis. 8/12, ID consulted.  Started on vancomycin.  Repeat blood cultures 8/13.  8/13 had itching /rash with vancomycin. D/w ID will switch to  linezolid     Assessment & Plan:   Septic shock present on admission. Hemodynamically improving.  No localizing source of infection. Blood cultures on admission 1 out of 2 Staph epidermidis.  Kept off antibiotics. PICC line removed 8/11 Blood cultures 8/11, growing staph.  Started on vancomycin.  Echocardiogram on admission was without any vegetations.  Repeat blood cultures tomorrow. Suspected partially treated/low-grade infection of PICC line. 8/13 continue midodrine as blood pressure improving  Continue IV fluids  Repeat blood cultures drawn today we will follow-up    Gastric bypass, intolerance to oral intake on TPN support. Followed by dietitian.  Gradually introducing oral intake.  8/13 TPN on hold as PICC line is taken off Given maintenance fluid  Elevated LFTs:  Right upper quadrant ultrasound normal  Likely due to septic shock  Improving  Continue to monitor    Bactermia Bcx x2 staph epi. In setting of PICC ID following Started on vacomycin but has pruritus and rash.  Discussed with ID and will switch to linezolid    DVT prophylaxis:   Lovenox subcu   Code Status: Full code Family Communication: None at the bedside Disposition Plan: Status is: Inpatient Remains inpatient appropriate because: Bacteremia.  On antibiotics.     Consultants:  Critical care Infectious disease  Procedures:  None  Antimicrobials:  None   Subjective: Feels tired and weak but overall better than when she first came in   Objective: Vitals:   04/10/22 1659 04/10/22 2115 04/11/22 0826 04/11/22 1139  BP: (!) 94/58 Marland Kitchen)  106/55 103/60 110/65  Pulse: 93  75 77  Resp: '20  18 18  '$ Temp: 98.6 F (37 C) 97.7 F (36.5 C) 98.5 F (36.9 C) 98.8 F (37.1 C)  TempSrc: Oral Oral Oral Oral  SpO2: 98%  98% 98%  Weight:      Height:        Intake/Output Summary (Last 24 hours) at 04/11/2022 1344 Last data filed at 04/11/2022 0900 Gross per 24 hour  Intake 315 ml  Output --   Net 315 ml   Filed Weights   04/07/22 0500 04/08/22 0500 04/09/22 0409  Weight: 127.9 kg 127.1 kg 124.9 kg    Examination: Calm, NAD Cta no w/r Reg s1/s2 no gallop Soft benign +bs No edema Aaoxox3  Mood and affect appropriate in current setting    Data Reviewed: I have personally reviewed following labs and imaging studies  CBC: Recent Labs  Lab 04/04/22 1530 04/04/22 1553 04/05/22 0115 04/08/22 0500 04/11/22 0055  WBC 6.5  --  5.7 6.4 8.7  NEUTROABS 5.2  --   --  3.1 4.7  HGB 13.3 13.9 10.7* 11.4* 11.5*  HCT 39.3 41.0 32.5* 34.3* 34.3*  MCV 88.1  --  90.5 90.7 89.3  PLT 223  --  194 233 378   Basic Metabolic Panel: Recent Labs  Lab 04/06/22 0826 04/08/22 0500 04/09/22 0733 04/10/22 0122 04/11/22 0055  NA 140 135 134* 137 136  K 4.1 4.3 4.6 4.2 3.9  CL 110 107 105 106 108  CO2 '22 23 23 22 '$ 20*  GLUCOSE 122* 99 115* 86 87  BUN '7 9 16 15 10  '$ CREATININE 0.67 0.68 0.64 0.62 0.75  CALCIUM 8.6* 9.0 9.5 9.3 8.9  MG 1.8 1.8 1.9 1.9 1.9  PHOS 3.4 4.5 4.1 4.4 4.2   GFR: Estimated Creatinine Clearance: 139.4 mL/min (by C-G formula based on SCr of 0.75 mg/dL). Liver Function Tests: Recent Labs  Lab 04/06/22 0826 04/08/22 0500 04/09/22 0733 04/10/22 0122 04/11/22 0055  AST 32 129* 143* 126* 78*  ALT 45* 125* 188* 187* 145*  ALKPHOS 71 74 89 87 83  BILITOT 0.3 0.2* 0.3 0.4 0.4  PROT 5.8* 5.9* 6.4* 6.4* 6.1*  ALBUMIN 2.5* 2.7* 3.1* 3.1* 2.9*   No results for input(s): "LIPASE", "AMYLASE" in the last 168 hours. No results for input(s): "AMMONIA" in the last 168 hours. Coagulation Profile: No results for input(s): "INR", "PROTIME" in the last 168 hours. Cardiac Enzymes: No results for input(s): "CKTOTAL", "CKMB", "CKMBINDEX", "TROPONINI" in the last 168 hours. BNP (last 3 results) No results for input(s): "PROBNP" in the last 8760 hours. HbA1C: No results for input(s): "HGBA1C" in the last 72 hours. CBG: Recent Labs  Lab 04/09/22 2050 04/10/22 0748  04/10/22 2029 04/11/22 0815 04/11/22 0816  GLUCAP 83 91 77 72 73   Lipid Profile: No results for input(s): "CHOL", "HDL", "LDLCALC", "TRIG", "CHOLHDL", "LDLDIRECT" in the last 72 hours.  Thyroid Function Tests: No results for input(s): "TSH", "T4TOTAL", "FREET4", "T3FREE", "THYROIDAB" in the last 72 hours. Anemia Panel: No results for input(s): "VITAMINB12", "FOLATE", "FERRITIN", "TIBC", "IRON", "RETICCTPCT" in the last 72 hours. Sepsis Labs: Recent Labs  Lab 04/04/22 1530 04/04/22 1930 04/05/22 0115 04/06/22 0826 04/07/22 0116  PROCALCITON 0.66  --  1.28 0.55  --   LATICACIDVEN 1.7 0.9  --   --  0.9    Recent Results (from the past 240 hour(s))  Blood culture (routine x 2)     Status: Abnormal   Collection  Time: 04/04/22  3:18 PM   Specimen: BLOOD  Result Value Ref Range Status   Specimen Description BLOOD LEFT ANTECUBITAL  Final   Special Requests   Final    BOTTLES DRAWN AEROBIC AND ANAEROBIC Blood Culture results may not be optimal due to an inadequate volume of blood received in culture bottles   Culture  Setup Time   Final    GRAM POSITIVE COCCI IN CLUSTERS ANAEROBIC BOTTLE ONLY PHARMD A. PAYTES [id number] '@1352'$  FH    Culture (A)  Final    STAPHYLOCOCCUS EPIDERMIDIS THE SIGNIFICANCE OF ISOLATING THIS ORGANISM FROM A SINGLE SET OF BLOOD CULTURES WHEN MULTIPLE SETS ARE DRAWN IS UNCERTAIN. PLEASE NOTIFY THE MICROBIOLOGY DEPARTMENT WITHIN ONE WEEK IF SPECIATION AND SENSITIVITIES ARE REQUIRED. Performed at [facility], [place] [address], [place], [place] [address]    Report Status 04/07/2022 FINAL  Final  Blood Culture ID Panel (Reflexed)     Status: Abnormal   Collection Time: 04/04/22  3:18 PM  Result Value Ref Range Status   Enterococcus faecalis NOT DETECTED NOT DETECTED Final   Enterococcus Faecium NOT DETECTED NOT DETECTED Final   Listeria monocytogenes NOT DETECTED NOT DETECTED Final   Staphylococcus species DETECTED (A) NOT DETECTED Final    Comment:  CRITICAL RESULT CALLED TO, READ BACK BY AND VERIFIED WITH: PHARMD A. PAYTES [id number] '@1352'$  FH    Staphylococcus aureus (BCID) NOT DETECTED NOT DETECTED Final   Staphylococcus epidermidis DETECTED (A) NOT DETECTED Final    Comment: CRITICAL RESULT CALLED TO, READ BACK BY AND VERIFIED WITH: PHARMD A. PAYTES [id number] '@1352'$  FH    Staphylococcus lugdunensis NOT DETECTED NOT DETECTED Final   Streptococcus species NOT DETECTED NOT DETECTED Final   Streptococcus agalactiae NOT DETECTED NOT DETECTED Final   Streptococcus pneumoniae NOT DETECTED NOT DETECTED Final   Streptococcus pyogenes NOT DETECTED NOT DETECTED Final   A.calcoaceticus-baumannii NOT DETECTED NOT DETECTED Final   Bacteroides fragilis NOT DETECTED NOT DETECTED Final   Enterobacterales NOT DETECTED NOT DETECTED Final   Enterobacter cloacae complex NOT DETECTED NOT DETECTED Final   Escherichia coli NOT DETECTED NOT DETECTED Final   Klebsiella aerogenes NOT DETECTED NOT DETECTED Final   Klebsiella oxytoca NOT DETECTED NOT DETECTED Final   Klebsiella pneumoniae NOT DETECTED NOT DETECTED Final   Proteus species NOT DETECTED NOT DETECTED Final   Salmonella species NOT DETECTED NOT DETECTED Final   Serratia marcescens NOT DETECTED NOT DETECTED Final   Haemophilus influenzae NOT DETECTED NOT DETECTED Final   Neisseria meningitidis NOT DETECTED NOT DETECTED Final   Pseudomonas aeruginosa NOT DETECTED NOT DETECTED Final   Stenotrophomonas maltophilia NOT DETECTED NOT DETECTED Final   Candida albicans NOT DETECTED NOT DETECTED Final   Candida auris NOT DETECTED NOT DETECTED Final   Candida glabrata NOT DETECTED NOT DETECTED Final   Candida krusei NOT DETECTED NOT DETECTED Final   Candida parapsilosis NOT DETECTED NOT DETECTED Final   Candida tropicalis NOT DETECTED NOT DETECTED Final   Cryptococcus neoformans/gattii NOT DETECTED NOT DETECTED Final   Methicillin resistance mecA/C NOT DETECTED NOT DETECTED Final    Comment: Performed at  [facility], [address], [place], [place] [address]  Blood culture (routine x 2)     Status: None   Collection Time: 04/04/22  3:23 PM   Specimen: BLOOD  Result Value Ref Range Status   Specimen Description BLOOD LEFT ANTECUBITAL  Final   Special Requests   Final    BOTTLES DRAWN AEROBIC AND ANAEROBIC Blood  Culture adequate volume   Culture   Final    NO GROWTH 5 DAYS Performed at [facility], [place] [address] [place], [place] [address]    Report Status 04/09/2022 FINAL  Final  Resp Panel by RT-PCR (Flu A&B, Covid) Anterior Nasal Swab     Status: None   Collection Time: 04/04/22  3:23 PM   Specimen: Anterior Nasal Swab  Result Value Ref Range Status   SARS Coronavirus 2 by RT PCR NEGATIVE NEGATIVE Final    Comment: (NOTE) SARS-CoV-2 target nucleic acids are NOT DETECTED.  The SARS-CoV-2 RNA is generally detectable in upper respiratory specimens during the acute phase of infection. The lowest concentration of SARS-CoV-2 viral copies this assay can detect is 138 copies/mL. A negative result does not preclude SARS-Cov-2 infection and should not be used as the sole basis for treatment or other patient management decisions. A negative result may occur with  improper specimen collection/handling, submission of specimen other than nasopharyngeal swab, presence of viral mutation(s) within the areas targeted by this assay, and inadequate number of viral copies(<138 copies/mL). A negative result must be combined with clinical observations, patient history, and epidemiological information. The expected result is Negative.  Fact Sheet for Patients:  EntrepreneurPulse.com.au  Fact Sheet for Healthcare Providers:  IncredibleEmployment.be  This test is no t yet approved or cleared by the [place] FDA and  has been authorized for detection and/or diagnosis of SARS-CoV-2 by FDA under an Emergency Use Authorization (EUA). This EUA will  remain  in effect (meaning this test can be used) for the duration of the COVID-19 declaration under Section 564(b)(1) of the Act, 21 U.S.C.section 360bbb-3(b)(1), unless the authorization is terminated  or revoked sooner.       Influenza A by PCR NEGATIVE NEGATIVE Final   Influenza B by PCR NEGATIVE NEGATIVE Final    Comment: (NOTE) The Xpert Xpress SARS-CoV-2/FLU/RSV plus assay is intended as an aid in the diagnosis of influenza from Nasopharyngeal swab specimens and should not be used as a sole basis for treatment. Nasal washings and aspirates are unacceptable for Xpert Xpress SARS-CoV-2/FLU/RSV testing.  Fact Sheet for Patients: EntrepreneurPulse.com.au  Fact Sheet for Healthcare Providers: IncredibleEmployment.be  This test is not yet approved or cleared by the [place] FDA and has been authorized for detection and/or diagnosis of SARS-CoV-2 by FDA under an Emergency Use Authorization (EUA). This EUA will remain in effect (meaning this test can be used) for the duration of the COVID-19 declaration under Section 564(b)(1) of the Act, 21 U.S.C. section 360bbb-3(b)(1), unless the authorization is terminated or revoked.  Performed at [facility], [place] [address], [place], [place] [address]   Urine Culture     Status: None   Collection Time: 04/04/22  6:47 PM   Specimen: Urine, Clean Catch  Result Value Ref Range Status   Specimen Description URINE, CLEAN CATCH  Final   Special Requests NONE  Final   Culture   Final    NO GROWTH Performed at [facility], [place] [address] [place], [place] [address]    Report Status 04/06/2022 FINAL  Final  MRSA Next Gen by PCR, Nasal     Status: None   Collection Time: 04/04/22  9:45 PM   Specimen: Nasal Mucosa; Nasal Swab  Result Value Ref Range Status   MRSA by PCR Next Gen NOT DETECTED NOT DETECTED Final    Comment: (NOTE) The GeneXpert MRSA Assay (FDA approved for NASAL  specimens only),  is one component of a comprehensive MRSA colonization surveillance program. It is not intended to diagnose MRSA infection nor to guide or monitor treatment for MRSA infections. Test performance is not FDA approved in patients less than 54 years old. Performed at [facility], [place] [address], [place], [place] [address]   Culture, blood (Routine X 2) w Reflex to ID Panel     Status: Abnormal (Preliminary result)   Collection Time: 04/09/22 11:25 AM   Specimen: BLOOD  Result Value Ref Range Status   Specimen Description BLOOD BLOOD LEFT WRIST  Final   Special Requests   Final    BOTTLES DRAWN AEROBIC AND ANAEROBIC Blood Culture adequate volume   Culture  Setup Time   Final    GRAM POSITIVE COCCI IN CLUSTERS IN BOTH AEROBIC AND ANAEROBIC BOTTLES CRITICAL VALUE NOTED.  VALUE IS CONSISTENT WITH PREVIOUSLY REPORTED AND CALLED VALUE. Performed at [facility], [place] [address] [place], [place] [address]    Culture STAPHYLOCOCCUS EPIDERMIDIS (A)  Final   Report Status PENDING  Incomplete  Culture, blood (Routine X 2) w Reflex to ID Panel     Status: Abnormal (Preliminary result)   Collection Time: 04/09/22 11:46 AM   Specimen: BLOOD  Result Value Ref Range Status   Specimen Description BLOOD BLOOD LEFT HAND  Final   Special Requests   Final    BOTTLES DRAWN AEROBIC AND ANAEROBIC Blood Culture results may not be optimal due to an inadequate volume of blood received in culture bottles   Culture  Setup Time   Final    GRAM POSITIVE COCCI IN CLUSTERS IN BOTH AEROBIC AND ANAEROBIC BOTTLES CRITICAL RESULT CALLED TO, READ BACK BY AND VERIFIED WITH:  C/ PHARMD JAMES L. 04/10/22 0650 A. LAFRANCE    Culture (A)  Final    STAPHYLOCOCCUS EPIDERMIDIS SUSCEPTIBILITIES TO FOLLOW Performed at [facility], [place] [address], [place], [place] [address]    Report Status PENDING  Incomplete         Radiology Studies: No results found.      Scheduled Meds:   Chlorhexidine Gluconate Cloth  6 each Topical Daily   enoxaparin (LOVENOX) injection  60 mg Subcutaneous Daily   insulin aspart  0-9 Units Subcutaneous Q12H   metoCLOPramide  10 mg Oral TID   midodrine  10 mg Oral Q8H   Ensure Max Protein  11 oz Oral TID   sodium chloride flush  10-40 mL Intracatheter Q12H   Continuous Infusions:  sodium chloride Stopped (04/06/22 1005)   dextrose 5 % and 0.9% NaCl 75 mL/hr at 04/10/22 0955   linezolid (ZYVOX) IV 600 mg (04/11/22 1053)     LOS: 7 days    Time spent: 35 minutes    Nolberto Hanlon, MD [facility] Pager [phone]

## 2022-04-11 NOTE — Progress Notes (Signed)
ID Brief note   Reported itching with Vancomycin per pharmacy and primary, questioning about switching to  linezolid?  DC Vancomycin and start daptomycin, order placed for pharmacy  Monitor CPK  Dr Gale Journey to follow from tomorrow   Rosiland Oz, MD Infectious Disease Physician [facility] [address] [place], [place] [address] Phone: [phone]  Fax: [phone]

## 2022-04-12 DIAGNOSIS — B957 Other staphylococcus as the cause of diseases classified elsewhere: Secondary | ICD-10-CM | POA: Diagnosis not present

## 2022-04-12 DIAGNOSIS — R579 Shock, unspecified: Secondary | ICD-10-CM | POA: Diagnosis not present

## 2022-04-12 DIAGNOSIS — R7881 Bacteremia: Secondary | ICD-10-CM

## 2022-04-12 LAB — CULTURE, BLOOD (ROUTINE X 2): Special Requests: ADEQUATE

## 2022-04-12 LAB — GLUCOSE, CAPILLARY
Glucose-Capillary: 80 mg/dL (ref 70–99)
Glucose-Capillary: 90 mg/dL (ref 70–99)
Glucose-Capillary: 78 mg/dL (ref 70–99)
Glucose-Capillary: 85 mg/dL (ref 70–99)

## 2022-04-12 MED ORDER — CALCIUM CARBONATE ANTACID 500 MG PO CHEW
200.0000 mg | CHEWABLE_TABLET | Freq: Three times a day (TID) | ORAL | Status: DC
  Administered 2022-04-12 – 2022-04-15 (×10): 200 mg via ORAL
  Filled 2022-04-12 (×10): qty 1

## 2022-04-12 MED ORDER — CEFAZOLIN SODIUM-DEXTROSE 2-4 GM/100ML-% IV SOLN
2.0000 g | Freq: Three times a day (TID) | INTRAVENOUS | Status: DC
Start: 2022-04-12 — End: 2022-04-15
  Administered 2022-04-12 – 2022-04-15 (×9): 2 g via INTRAVENOUS
  Filled 2022-04-12 (×9): qty 100

## 2022-04-12 MED ORDER — ENSURE MAX PROTEIN PO LIQD
11.0000 [oz_av] | Freq: Two times a day (BID) | ORAL | Status: DC
Start: 2022-04-12 — End: 2022-04-12

## 2022-04-12 MED ORDER — SODIUM CHLORIDE 0.9 % IV SOLN
8.0000 mg/kg | Freq: Every day | INTRAVENOUS | Status: DC
  Filled 2022-04-12: qty 14

## 2022-04-12 MED ORDER — ADULT MULTIVITAMIN W/MINERALS CH
1.0000 | ORAL_TABLET | Freq: Two times a day (BID) | ORAL | Status: DC
  Administered 2022-04-12 – 2022-04-15 (×6): 1 via ORAL
  Filled 2022-04-12 (×6): qty 1

## 2022-04-12 MED ORDER — ENSURE MAX PROTEIN PO LIQD
11.0000 [oz_av] | Freq: Three times a day (TID) | ORAL | Status: DC
  Administered 2022-04-12 – 2022-04-15 (×8): 11 [oz_av] via ORAL

## 2022-04-12 NOTE — Progress Notes (Signed)
ID inpatient progress note   Subjective: Patient felt well No complaint  8/13 repeat bcx ngtd 8/11 repeat bcx msse  Picc removed 8/11  8/07 tte unremarkable  Eating decent -- nutrition team determining caloric balance to see if picc line needed for further tpn   Objective: Vitals:   04/12/22 0409 04/12/22 0912  BP: (!) 91/42 102/66  Pulse:  75  Resp: 19 18  Temp:  98.6 F (37 C)  SpO2: 97% 100%   General: walking hallway, talking on phone, no distress, pleasant Heent: normocephalic; per; conj clear Pulm: normal respiratory effort Skin: no rash Ext no edema:  Neuro: nonfocal   Labs: Reviewed  Lab Results  Component Value Date   WBC 8.7 04/11/2022   HGB 11.5 (L) 04/11/2022   HCT 34.3 (L) 04/11/2022   MCV 89.3 04/11/2022   PLT 270 26/83/4196   Last metabolic panel Lab Results  Component Value Date   GLUCOSE 87 04/11/2022   NA 136 04/11/2022   K 3.9 04/11/2022   CL 108 04/11/2022   CO2 20 (L) 04/11/2022   BUN 10 04/11/2022   CREATININE 0.75 04/11/2022   GFRNONAA >60 04/11/2022   CALCIUM 8.9 04/11/2022   PHOS 4.2 04/11/2022   PROT 6.1 (L) 04/11/2022   ALBUMIN 2.9 (L) 04/11/2022   LABGLOB 2.6 05/03/2019   AGRATIO 1.8 05/03/2019   BILITOT 0.4 04/11/2022   ALKPHOS 83 04/11/2022   AST 78 (H) 04/11/2022   ALT 145 (H) 04/11/2022   ANIONGAP 8 04/11/2022   Micro: Hep b sAg negative    A/p Staph epi bacteremia, likely due to clabsi   Picc removed 8/11 and repeat bcx post removal ngtd   For staph epididermis, unclear if the phenomenom of heteroresistance translate into clinical implication, especially newer technology now a day also tests for genetic evidence of resistance (in this case negative)   -ok to transition to cefazolin 2 gram iv q8hr. Would treat for 5 days from the time of negative blood culture until 8/17 -if patient is to leave prior to 8/17 could finish treatment with linezolid 600 mg po bid -if she doesn't need picc would be best  way to go. If picc is needed, would wait until at least end of tomorrow to make sure 8/13 bcx remains negative prior to placement of the new picc -id will sign off -discussed with primary team     I spent more than 35 minute reviewing data/chart, and coordinating care and >50% direct face to face time providing counseling/discussing diagnostics/treatment plan with patient

## 2022-04-12 NOTE — Plan of Care (Addendum)
A&Ox4, standby assist to bathroom, daptomycin started - no acute adverse events. Zofran x1 PRN given for nausea after eating, poor tolerance for PO intake.   Problem: Education: Goal: Knowledge of General Education information will improve Description: Including pain rating scale, medication(s)/side effects and non-pharmacologic comfort measures Outcome: Progressing   Problem: Health Behavior/Discharge Planning: Goal: Ability to manage health-related needs will improve Outcome: Progressing   Problem: Clinical Measurements: Goal: Ability to maintain clinical measurements within normal limits will improve Outcome: Progressing Goal: Will remain free from infection Outcome: Progressing Goal: Diagnostic test results will improve Outcome: Progressing Goal: Respiratory complications will improve Outcome: Progressing Goal: Cardiovascular complication will be avoided Outcome: Progressing   Problem: Activity: Goal: Risk for activity intolerance will decrease Outcome: Progressing   Problem: Nutrition: Goal: Adequate nutrition will be maintained Outcome: Progressing   Problem: Coping: Goal: Level of anxiety will decrease Outcome: Progressing   Problem: Elimination: Goal: Will not experience complications related to bowel motility Outcome: Progressing Goal: Will not experience complications related to urinary retention Outcome: Progressing   Problem: Pain Managment: Goal: General experience of comfort will improve Outcome: Progressing   Problem: Safety: Goal: Ability to remain free from injury will improve Outcome: Progressing   Problem: Skin Integrity: Goal: Risk for impaired skin integrity will decrease Outcome: Progressing

## 2022-04-12 NOTE — Progress Notes (Signed)
Nutrition Follow-up  DOCUMENTATION CODES:   Not applicable  INTERVENTION:  Continue calorie count to assess need for re-initiation of TPN  Liberalize diet from a carb modified to a regular diet Ensure Max po TID, each supplement provides 150 kcal and 30 grams of protein.  Or Ensure Plus High Protein po BID, each supplement provides 350 kcal and 20 grams of protein if Ensure Max is unavailable MVI with minerals BID 221m elemental calcium carbonate TID  NUTRITION DIAGNOSIS:   Inadequate oral intake related to altered GI function as evidenced by meal completion < 50%.  Ongoing  GOAL:   Patient will meet greater than or equal to 90% of their needs  Goal not met-addressing via meals and nutrition supplements  MONITOR:   I & O's, Labs, PO intake  REASON FOR ASSESSMENT:   Consult New TPN/TNA  ASSESSMENT:   36yo female admitted with fever. 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  8/11: PICC line removed d/t line infection  No meal tickets to review from 72 hour calorie count. Obtained recall from patient and limited meal documentation from flowsheet records. Will continue calorie count to assess nutritional adequacy for possible need of re-initiation of TPN. Discussed calorie count with RN. RN to keep log of patient intake.   Saturday she reports eating a couple bites of a veggie omelette for breakfast. She ordered a chicken caesar salad for lunch and ate a few bites prioritizing the chicken. She had a few spoonfuls of yogurt as well. She drank 50% of an ensure with ice cream. (~365kcal and ~21g pro)  Sunday she are a few bites of an omelette for breakfast; a few spoonfuls of yogurt and 1/2 of a vegan hot dog with chili last night. (~201kcal and 16g pro)  This morning she did not have breakfast. She has 2 pieces of candy and honey dew to help with her blood sugar and was being encouraged to drink  some juice.  She reports that her nausea and vomiting have significantly improved. Yesterday she had 2 episodes of emesis. Today she has had no nausea or emesis. Continues with low blood sugar, likely d/t limited PO intake. She is trying to prioritize protein intake. She would benefit from a liberalized diet as she is not eating enough at this time to exceed nutrient limits. Can consider adjusting diet as PO intake improves.   Admit weight: 127.7 kg Current weight: 124.9 kg  Medications: SSI 0-9 q12h, reglan  Labs: CBG's 90, 80, 89, 73, 72 x24 hours  Diet Order:   Diet Order             Diet Carb Modified Fluid consistency: Thin; Room service appropriate? Yes  Diet effective now                   EDUCATION NEEDS:   Not appropriate for education at this time  Skin:  Skin Assessment: Reviewed RN Assessment  Last BM:  8/8 type 6  Height:   Ht Readings from Last 1 Encounters:  04/04/22 '5\' 8"'  (1.727 m)    Weight:   Wt Readings from Last 1 Encounters:  04/09/22 124.9 kg   BMI:  Body mass index is 41.87 kg/m.  Estimated Nutritional Needs:   Kcal:  2000-2200  Protein:  120-140 gm  Fluid:  2-2.2 L  AClayborne Dana RDN, LDN Clinical Nutrition

## 2022-04-12 NOTE — Plan of Care (Signed)

## 2022-04-12 NOTE — Progress Notes (Signed)
Mobility Specialist Progress Note   04/12/22 1126  Mobility  Activity Ambulated independently in hallway  Level of Assistance Independent after set-up  Assistive Device None  Distance Ambulated (ft) 550 ft  Activity Response Tolerated well  $Mobility charge 1 Mobility    Pre Mobility: 74 HR,  127/81 BP During Mobility: 81 HR  Post Mobility: 70 HR, 132/59 BP  Patient received in chair having no complaints and agreeable. Ambulated in hallway independently with steady gait. Returned to room complaining of mild fatigue but no faults recorded. Was left in bed with all needs met and call bell in reach.    Holland Falling Mobility Specialist MS [facility] #:  [phone] Acute Rehab Office:  [phone]

## 2022-04-12 NOTE — Progress Notes (Signed)
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  Presented to the ED 8/6 with fever of 104.4 and septic shock.  Admitted to the ICU for pressors, CT chest abdomen pelvis were negative. -Subsequently noted to have Staph epidermidis bacteremia, repeat cultures also positive for staph epi, PICC line removed, infectious disease consulted   Subjective: -Feels better today, first day where she is starting to feel normal  Assessment and Plan:  Septic shock Staph epidermidis bacteremia -Secondary to PICC line infection, treated with IV vancomycin then switched to Daptomycin per infectious disease -ID following, blood cultures positive on 8/6 and 8/11 -PICC line discontinued 8/11, blood cultures from 8/13 negative thus far -Appreciate infectious disease input, recommending transition to IV cefazolin to treat for 5 more days from time of negative culture until 8/17 -Ideally try to avoid PICC if possible, if absolutely needed could be placed at the end of tomorrow to make sure 8/13 cultures remain negative  Recent gastric bypass -This was performed by Dr. Evorn Gong at [facility] in [place] in May, after which she had frequent nausea and vomiting, limited p.o. intake, recurrent dehydration requiring saline infusions few times a week, has been on TPN since June, symptoms slowly improving, improving p.o. challenge over the last several weeks until this infection. -P.o. intake slowly improving, RD following, continue calorie count -We are hoping to avoid replacing PICC line if possible  Abnormal LFTs -Secondary to sepsis, shock, RUQ [place] was normal   DVT prophylaxis: Lovenox Code Status: Full code Family Communication: Discussed with  patient in detail, no family at bedside Disposition Plan: Home likely 2 to 3 days  Consultants:    Procedures:   Antimicrobials:    Objective: Vitals:   04/12/22 0005 04/12/22 0408 04/12/22 0409 04/12/22 0912  BP: 106/64  (!) 91/42 102/66  Pulse:    75  Resp: '13  19 18  '$ Temp: 98.6 F (37 C) 98.1 F (36.7 C)  98.6 F (37 C)  TempSrc:  Oral    SpO2:   97% 100%  Weight:      Height:        Intake/Output Summary (Last 24 hours) at 04/12/2022 1253 Last data filed at 04/12/2022 0400 Gross per 24 hour  Intake 3208.51 ml  Output --  Net 3208.51 ml   Filed Weights   04/07/22 0500 04/08/22 0500 04/09/22 0409  Weight: 127.9 kg 127.1 kg 124.9 kg    Examination:  General exam: Appears calm and comfortable  Respiratory system: Clear to auscultation Cardiovascular system: S1 & S2 heard, RRR.  Abd: nondistended, soft and nontender.Normal bowel sounds heard. Central nervous system: Alert and oriented. No focal neurological deficits. Extremities: no edema Skin: No rashes Psychiatry:  Mood & affect appropriate.     Data Reviewed:   CBC: Recent Labs  Lab 04/08/22 0500 04/11/22 0055  WBC 6.4 8.7  NEUTROABS 3.1 4.7  HGB 11.4* 11.5*  HCT 34.3* 34.3*  MCV 90.7 89.3  PLT 233 270   Basic Metabolic Panel: Recent Labs  Lab 04/06/22 0826 04/08/22 0500 04/09/22 0733 04/10/22 0122 04/11/22 0055  NA 140 135 134* 137 136  K 4.1 4.3 4.6 4.2 3.9  CL 110 107 105 106 108  CO2 '22 23 23 22 '$ 20*  GLUCOSE 122* 99 115* 86  87  BUN '7 9 16 15 10  '$ CREATININE 0.67 0.68 0.64 0.62 0.75  CALCIUM 8.6* 9.0 9.5 9.3 8.9  MG 1.8 1.8 1.9 1.9 1.9  PHOS 3.4 4.5 4.1 4.4 4.2   GFR: Estimated Creatinine Clearance: 139.4 mL/min (by C-G formula based on SCr of 0.75 mg/dL). Liver Function Tests: Recent Labs  Lab 04/06/22 0826 04/08/22 0500 04/09/22 0733 04/10/22 0122 04/11/22 0055  AST 32 129* 143* 126* 78*  ALT 45* 125* 188* 187* 145*  ALKPHOS 71 74 89 87 83  BILITOT 0.3 0.2* 0.3 0.4  0.4  PROT 5.8* 5.9* 6.4* 6.4* 6.1*  ALBUMIN 2.5* 2.7* 3.1* 3.1* 2.9*   No results for input(s): "LIPASE", "AMYLASE" in the last 168 hours. No results for input(s): "AMMONIA" in the last 168 hours. Coagulation Profile: No results for input(s): "INR", "PROTIME" in the last 168 hours. Cardiac Enzymes: No results for input(s): "CKTOTAL", "CKMB", "CKMBINDEX", "TROPONINI" in the last 168 hours. BNP (last 3 results) No results for input(s): "PROBNP" in the last 8760 hours. HbA1C: No results for input(s): "HGBA1C" in the last 72 hours. CBG: Recent Labs  Lab 04/11/22 0815 04/11/22 0816 04/11/22 2208 04/12/22 1024 04/12/22 1137  GLUCAP 72 73 89 80 90   Lipid Profile: No results for input(s): "CHOL", "HDL", "LDLCALC", "TRIG", "CHOLHDL", "LDLDIRECT" in the last 72 hours. Thyroid Function Tests: No results for input(s): "TSH", "T4TOTAL", "FREET4", "T3FREE", "THYROIDAB" in the last 72 hours. Anemia Panel: No results for input(s): "VITAMINB12", "FOLATE", "FERRITIN", "TIBC", "IRON", "RETICCTPCT" in the last 72 hours. Urine analysis:    Component Value Date/Time   COLORURINE YELLOW 04/04/2022 1847   APPEARANCEUR HAZY (A) 04/04/2022 1847   LABSPEC 1.012 04/04/2022 1847   PHURINE 5.0 04/04/2022 1847   GLUCOSEU NEGATIVE 04/04/2022 1847   HGBUR SMALL (A) 04/04/2022 1847   BILIRUBINUR NEGATIVE 04/04/2022 1847   BILIRUBINUR negative 05/24/2019 1016   KETONESUR NEGATIVE 04/04/2022 1847   PROTEINUR NEGATIVE 04/04/2022 1847   UROBILINOGEN 0.2 09/12/2019 1749   NITRITE NEGATIVE 04/04/2022 1847   LEUKOCYTESUR NEGATIVE 04/04/2022 1847   Sepsis Labs: '@LABRCNTIP'$ (procalcitonin:4,lacticidven:4)  ) Recent Results (from the past 240 hour(s))  Blood culture (routine x 2)     Status: Abnormal   Collection Time: 04/04/22  3:18 PM   Specimen: BLOOD  Result Value Ref Range Status   Specimen Description BLOOD LEFT ANTECUBITAL  Final   Special Requests   Final    BOTTLES DRAWN AEROBIC AND ANAEROBIC  Blood Culture results may not be optimal due to an inadequate volume of blood received in culture bottles   Culture  Setup Time   Final    GRAM POSITIVE COCCI IN CLUSTERS ANAEROBIC BOTTLE ONLY PHARMD A. PAYTES [id number] '@1352'$  FH    Culture (A)  Final    STAPHYLOCOCCUS EPIDERMIDIS THE SIGNIFICANCE OF ISOLATING THIS ORGANISM FROM A SINGLE SET OF BLOOD CULTURES WHEN MULTIPLE SETS ARE DRAWN IS UNCERTAIN. PLEASE NOTIFY THE MICROBIOLOGY DEPARTMENT WITHIN ONE WEEK IF SPECIATION AND SENSITIVITIES ARE REQUIRED. Performed at [facility], [place] [address] [place],  [address]    Report Status 04/07/2022 FINAL  Final  Blood Culture ID Panel (Reflexed)     Status: Abnormal   Collection Time: 04/04/22  3:18 PM  Result Value Ref Range Status   Enterococcus faecalis NOT DETECTED NOT DETECTED Final   Enterococcus Faecium NOT DETECTED NOT DETECTED Final   Listeria monocytogenes NOT DETECTED NOT DETECTED Final   Staphylococcus species DETECTED (A) NOT DETECTED Final    Comment: CRITICAL  RESULT CALLED TO, READ BACK BY AND VERIFIED WITH: PHARMD A. PAYTES [id number] '@1352'$  FH    Staphylococcus aureus (BCID) NOT DETECTED NOT DETECTED Final   Staphylococcus epidermidis DETECTED (A) NOT DETECTED Final    Comment: CRITICAL RESULT CALLED TO, READ BACK BY AND VERIFIED WITH: PHARMD A. PAYTES [id number] '@1352'$  FH    Staphylococcus lugdunensis NOT DETECTED NOT DETECTED Final   Streptococcus species NOT DETECTED NOT DETECTED Final   Streptococcus agalactiae NOT DETECTED NOT DETECTED Final   Streptococcus pneumoniae NOT DETECTED NOT DETECTED Final   Streptococcus pyogenes NOT DETECTED NOT DETECTED Final   A.calcoaceticus-baumannii NOT DETECTED NOT DETECTED Final   Bacteroides fragilis NOT DETECTED NOT DETECTED Final   Enterobacterales NOT DETECTED NOT DETECTED Final   Enterobacter cloacae complex NOT DETECTED NOT DETECTED Final   Escherichia coli NOT DETECTED NOT DETECTED Final   Klebsiella aerogenes NOT DETECTED  NOT DETECTED Final   Klebsiella oxytoca NOT DETECTED NOT DETECTED Final   Klebsiella pneumoniae NOT DETECTED NOT DETECTED Final   Proteus species NOT DETECTED NOT DETECTED Final   Salmonella species NOT DETECTED NOT DETECTED Final   Serratia marcescens NOT DETECTED NOT DETECTED Final   Haemophilus influenzae NOT DETECTED NOT DETECTED Final   Neisseria meningitidis NOT DETECTED NOT DETECTED Final   Pseudomonas aeruginosa NOT DETECTED NOT DETECTED Final   Stenotrophomonas maltophilia NOT DETECTED NOT DETECTED Final   Candida albicans NOT DETECTED NOT DETECTED Final   Candida auris NOT DETECTED NOT DETECTED Final   Candida glabrata NOT DETECTED NOT DETECTED Final   Candida krusei NOT DETECTED NOT DETECTED Final   Candida parapsilosis NOT DETECTED NOT DETECTED Final   Candida tropicalis NOT DETECTED NOT DETECTED Final   Cryptococcus neoformans/gattii NOT DETECTED NOT DETECTED Final   Methicillin resistance mecA/C NOT DETECTED NOT DETECTED Final    Comment: Performed at [facility], [address], [place], [place] [address]  Blood culture (routine x 2)     Status: None   Collection Time: 04/04/22  3:23 PM   Specimen: BLOOD  Result Value Ref Range Status   Specimen Description BLOOD LEFT ANTECUBITAL  Final   Special Requests   Final    BOTTLES DRAWN AEROBIC AND ANAEROBIC Blood Culture adequate volume   Culture   Final    NO GROWTH 5 DAYS Performed at [facility], [place] [address] [place], [place] [address]    Report Status 04/09/2022 FINAL  Final  Resp Panel by RT-PCR (Flu A&B, Covid) Anterior Nasal Swab     Status: None   Collection Time: 04/04/22  3:23 PM   Specimen: Anterior Nasal Swab  Result Value Ref Range Status   SARS Coronavirus 2 by RT PCR NEGATIVE NEGATIVE Final    Comment: (NOTE) SARS-CoV-2 target nucleic acids are NOT DETECTED.  The SARS-CoV-2 RNA is generally detectable in upper respiratory specimens during the acute phase of infection. The  lowest concentration of SARS-CoV-2 viral copies this assay can detect is 138 copies/mL. A negative result does not preclude SARS-Cov-2 infection and should not be used as the sole basis for treatment or other patient management decisions. A negative result may occur with  improper specimen collection/handling, submission of specimen other than nasopharyngeal swab, presence of viral mutation(s) within the areas targeted by this assay, and inadequate number of viral copies(<138 copies/mL). A negative result must be combined with clinical observations, patient history, and epidemiological information. The expected result is Negative.  Fact Sheet for Patients:  EntrepreneurPulse.com.au  Fact Sheet for  Healthcare Providers:  IncredibleEmployment.be  This test is no t yet approved or cleared by the [place] and  has been authorized for detection and/or diagnosis of SARS-CoV-2 by FDA under an Emergency Use Authorization (EUA). This EUA will remain  in effect (meaning this test can be used) for the duration of the COVID-19 declaration under Section 564(b)(1) of the Act, 21 U.S.C.section 360bbb-3(b)(1), unless the authorization is terminated  or revoked sooner.       Influenza A by PCR NEGATIVE NEGATIVE Final   Influenza B by PCR NEGATIVE NEGATIVE Final    Comment: (NOTE) The Xpert Xpress SARS-CoV-2/FLU/RSV plus assay is intended as an aid in the diagnosis of influenza from Nasopharyngeal swab specimens and should not be used as a sole basis for treatment. Nasal washings and aspirates are unacceptable for Xpert Xpress SARS-CoV-2/FLU/RSV testing.  Fact Sheet for Patients: EntrepreneurPulse.com.au  Fact Sheet for Healthcare Providers: IncredibleEmployment.be  This test is not yet approved or cleared by the [place] FDA and has been authorized for detection and/or diagnosis of SARS-CoV-2 by FDA under  an Emergency Use Authorization (EUA). This EUA will remain in effect (meaning this test can be used) for the duration of the COVID-19 declaration under Section 564(b)(1) of the Act, 21 U.S.C. section 360bbb-3(b)(1), unless the authorization is terminated or revoked.  Performed at [facility], [place] [address] [place], [place] [address]   Urine Culture     Status: None   Collection Time: 04/04/22  6:47 PM   Specimen: Urine, Clean Catch  Result Value Ref Range Status   Specimen Description URINE, CLEAN CATCH  Final   Special Requests NONE  Final   Culture   Final    NO GROWTH Performed at [facility], [place] [address], [place], [place] [address]    Report Status 04/06/2022 FINAL  Final  MRSA Next Gen by PCR, Nasal     Status: None   Collection Time: 04/04/22  9:45 PM   Specimen: Nasal Mucosa; Nasal Swab  Result Value Ref Range Status   MRSA by PCR Next Gen NOT DETECTED NOT DETECTED Final    Comment: (NOTE) The GeneXpert MRSA Assay (FDA approved for NASAL specimens only), is one component of a comprehensive MRSA colonization surveillance program. It is not intended to diagnose MRSA infection nor to guide or monitor treatment for MRSA infections. Test performance is not FDA approved in patients less than 60 years old. Performed at [facility], [place] [address] [place], [place] [address]   Culture, blood (Routine X 2) w Reflex to ID Panel     Status: Abnormal   Collection Time: 04/09/22 11:25 AM   Specimen: BLOOD  Result Value Ref Range Status   Specimen Description BLOOD BLOOD LEFT WRIST  Final   Special Requests   Final    BOTTLES DRAWN AEROBIC AND ANAEROBIC Blood Culture adequate volume   Culture  Setup Time   Final    GRAM POSITIVE COCCI IN CLUSTERS IN BOTH AEROBIC AND ANAEROBIC BOTTLES CRITICAL VALUE NOTED.  VALUE IS CONSISTENT WITH PREVIOUSLY REPORTED AND CALLED VALUE.    Culture (A)  Final    STAPHYLOCOCCUS EPIDERMIDIS SUSCEPTIBILITIES PERFORMED ON  PREVIOUS CULTURE WITHIN THE LAST 5 DAYS. Performed at [facility], [place] [address] [place],  [address]    Report Status 04/12/2022 FINAL  Final  Culture, blood (Routine X 2) w Reflex to ID Panel     Status: Abnormal   Collection Time:  04/09/22 11:46 AM   Specimen: BLOOD  Result Value Ref Range Status   Specimen Description BLOOD BLOOD LEFT HAND  Final   Special Requests   Final    BOTTLES DRAWN AEROBIC AND ANAEROBIC Blood Culture results may not be optimal due to an inadequate volume of blood received in culture bottles   Culture  Setup Time   Final    GRAM POSITIVE COCCI IN CLUSTERS IN BOTH AEROBIC AND ANAEROBIC BOTTLES CRITICAL RESULT CALLED TO, READ BACK BY AND VERIFIED WITH:  C/ PHARMD JAMES L. 04/10/22 0650 A. LAFRANCE Performed at [facility], [place] [address], [place], [place] [address]    Culture STAPHYLOCOCCUS EPIDERMIDIS (A)  Final   Report Status 04/12/2022 FINAL  Final   Organism ID, Bacteria STAPHYLOCOCCUS EPIDERMIDIS  Final      Susceptibility   Staphylococcus epidermidis - MIC*    CIPROFLOXACIN <=0.5 SENSITIVE Sensitive     ERYTHROMYCIN <=0.25 SENSITIVE Sensitive     GENTAMICIN <=0.5 SENSITIVE Sensitive     OXACILLIN <=0.25 SENSITIVE Sensitive     TETRACYCLINE 2 SENSITIVE Sensitive     VANCOMYCIN 2 SENSITIVE Sensitive     TRIMETH/SULFA <=10 SENSITIVE Sensitive     CLINDAMYCIN >=8 RESISTANT Resistant     RIFAMPIN <=0.5 SENSITIVE Sensitive     Inducible Clindamycin NEGATIVE Sensitive     * STAPHYLOCOCCUS EPIDERMIDIS  Culture, blood (Routine X 2) w Reflex to ID Panel     Status: None (Preliminary result)   Collection Time: 04/11/22 12:55 AM   Specimen: BLOOD  Result Value Ref Range Status   Specimen Description BLOOD LEFT ARM  Final   Special Requests   Final    BOTTLES DRAWN AEROBIC ONLY Blood Culture results may not be optimal due to an inadequate volume of blood received in culture bottles   Culture   Final    NO GROWTH 1 DAY Performed at  [facility], [place] [address] [place], [place] [address]    Report Status PENDING  Incomplete  Culture, blood (Routine X 2) w Reflex to ID Panel     Status: None (Preliminary result)   Collection Time: 04/11/22  1:03 AM   Specimen: BLOOD  Result Value Ref Range Status   Specimen Description BLOOD LEFT HAND  Final   Special Requests   Final    BOTTLES DRAWN AEROBIC ONLY Blood Culture results may not be optimal due to an inadequate volume of blood received in culture bottles   Culture   Final    NO GROWTH 1 DAY Performed at [facility], [place] [address] [place], [place] [address]    Report Status PENDING  Incomplete     Radiology Studies: No results found.   Scheduled Meds:  Chlorhexidine Gluconate Cloth  6 each Topical Daily   enoxaparin (LOVENOX) injection  60 mg Subcutaneous Daily   insulin aspart  0-9 Units Subcutaneous Q12H   metoCLOPramide  10 mg Oral TID   midodrine  10 mg Oral Q8H   Ensure Max Protein  11 oz Oral TID   sodium chloride flush  10-40 mL Intracatheter Q12H   Continuous Infusions:  sodium chloride Stopped (04/06/22 1005)    ceFAZolin (ANCEF) IV     dextrose 5 % and 0.9% NaCl 75 mL/hr at 04/11/22 1800     LOS: 8 days    Time spent: 16mn  PDomenic Polite MD [facility]   04/12/2022, 12:53 PM

## 2022-04-13 DIAGNOSIS — R579 Shock, unspecified: Secondary | ICD-10-CM | POA: Diagnosis not present

## 2022-04-13 DIAGNOSIS — R7881 Bacteremia: Secondary | ICD-10-CM | POA: Diagnosis not present

## 2022-04-13 LAB — CBC
HCT: 32 % — ABNORMAL LOW (ref 36.0–46.0)
Hemoglobin: 10.6 g/dL — ABNORMAL LOW (ref 12.0–15.0)
MCH: 29.9 pg (ref 26.0–34.0)
MCHC: 33.1 g/dL (ref 30.0–36.0)
MCV: 90.4 fL (ref 80.0–100.0)
Platelets: 291 10*3/uL (ref 150–400)
RBC: 3.54 MIL/uL — ABNORMAL LOW (ref 3.87–5.11)
RDW: 13.3 % (ref 11.5–15.5)
WBC: 7 10*3/uL (ref 4.0–10.5)
nRBC: 0 % (ref 0.0–0.2)

## 2022-04-13 LAB — COMPREHENSIVE METABOLIC PANEL
ALT: 123 U/L — ABNORMAL HIGH (ref 0–44)
AST: 69 U/L — ABNORMAL HIGH (ref 15–41)
Albumin: 2.8 g/dL — ABNORMAL LOW (ref 3.5–5.0)
Alkaline Phosphatase: 81 U/L (ref 38–126)
Anion gap: 6 (ref 5–15)
BUN: 7 mg/dL (ref 6–20)
CO2: 22 mmol/L (ref 22–32)
Calcium: 9 mg/dL (ref 8.9–10.3)
Chloride: 111 mmol/L (ref 98–111)
Creatinine, Ser: 0.71 mg/dL (ref 0.44–1.00)
GFR, Estimated: >60 mL/min (ref >60)
Glucose, Bld: 84 mg/dL (ref 70–99)
Potassium: 4 mmol/L (ref 3.5–5.1)
Sodium: 139 mmol/L (ref 135–145)
Total Bilirubin: 0.5 mg/dL (ref 0.3–1.2)
Total Protein: 5.8 g/dL — ABNORMAL LOW (ref 6.5–8.1)

## 2022-04-13 LAB — GLUCOSE, CAPILLARY
Glucose-Capillary: 71 mg/dL (ref 70–99)
Glucose-Capillary: 71 mg/dL (ref 70–99)
Glucose-Capillary: 75 mg/dL (ref 70–99)
Glucose-Capillary: 78 mg/dL (ref 70–99)
Glucose-Capillary: 81 mg/dL (ref 70–99)
Glucose-Capillary: 88 mg/dL (ref 70–99)

## 2022-04-13 MED ORDER — FLUCONAZOLE 150 MG PO TABS
150.0000 mg | ORAL_TABLET | ORAL | Status: DC
  Administered 2022-04-13: 150 mg via ORAL
  Filled 2022-04-13 (×2): qty 1

## 2022-04-13 MED ORDER — SODIUM CHLORIDE 0.9 % IV BOLUS
250.0000 mL | Freq: Once | INTRAVENOUS | Status: AC
  Administered 2022-04-13: 250 mL via INTRAVENOUS

## 2022-04-13 NOTE — Hospital Course (Signed)
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 She presented to the ED 8/6 with fever of 104.4 and septic shock.  Admitted to the ICU for pressors, CT chest abdomen pelvis was negative. Subsequently found to have Staph epidermidis bacteremia, repeat cultures also positive for staph epi, PICC line removed, infectious disease consulted.  She underwent repeat blood cultures on 04/11/2022 which remained negative.  A calorie count was also performed and she was still at a deficit for nutritional needs.  Therefore, she underwent repeat placement of a PICC line on 04/14/2022 in anticipation to continue on TPN at home at discharge.

## 2022-04-13 NOTE — Progress Notes (Signed)
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 She presented to the ED 8/6 with fever of 104.4 and septic shock.  Admitted to the ICU for pressors, CT chest abdomen pelvis was negative. Subsequently found to have Staph epidermidis bacteremia, repeat cultures also positive for staph epi, PICC line removed, infectious disease consulted.   Interval History:  No events overnight.  Resting in chair comfortably when seen this afternoon.  Tolerating some of her diet and has been keeping track of intake.  Dietitian also following for calorie count.  Assessment and Plan:  Septic shock Staph epidermidis bacteremia -Secondary to PICC line infection, treated with IV vancomycin then switched to Daptomycin per infectious disease -ID following, blood cultures positive on 8/6 and 8/11 -PICC line discontinued 8/11, blood cultures from 8/13 negative thus far -Appreciate infectious disease input, recommending transition to IV cefazolin to treat for 5 more days from time of negative culture until 8/17 (if leaves prior then complete course with zyvox) -Ideally trying to avoid PICC if possible, but now is 2 days with negative repeat culture which is reassuring for clearance    Recent gastric bypass -This was performed by Dr. Evorn Gong at [facility] in [place] in May, after which she had frequent nausea and vomiting, limited p.o. intake, recurrent dehydration requiring saline infusions few times a week, has been on TPN since June - RD following for calorie count  - if fails adequate intake, will need PICC and re-initiation of TPN  Abnormal LFTs -Secondary to sepsis, shock, RUQ [place]  normal   Old records reviewed in assessment of this patient  Antimicrobials: Ancef  DVT prophylaxis:  Lovenox   Code Status:   Code Status: Full Code  Mobility Assessment (last 72 hours)     Mobility Assessment     Row Name 04/13/22 1003 04/13/22 1000 04/12/22 1945 04/12/22 0800 04/11/22 2005   Does patient have an order for bedrest or is patient medically unstable No - Continue assessment No - Continue assessment No - Continue assessment No - Continue assessment No - Continue assessment   What is the highest level of mobility based on the progressive mobility assessment? Level 6 (Walks independently in room and hall) - Balance while walking in room without assist - Complete Level 6 (Walks independently in room and hall) - Balance while walking in room without assist - Complete Level 6 (Walks independently in room and hall) - Balance while walking in room without assist - Complete Level 6 (Walks independently in room and hall) - Balance while walking in room without assist - Complete Level 6 (Walks independently in room and hall) - Balance while walking in room without assist - Complete    Row Name 04/11/22 0924 04/10/22 2000         Does patient have an order for bedrest or is patient medically unstable No - Continue assessment No - Continue assessment      What is the highest level of mobility based on the progressive mobility assessment? Level 6 (Walks independently in room and hall) - Balance while walking in room without assist - Complete Level 6 (Walks independently in room and hall) - Balance while walking in room  without assist - Complete               Barriers to discharge: none Disposition Plan:  Home 1-2 days Status is: Inpat  Objective: Blood pressure 105/61, pulse 67, temperature 98.1 F (36.7 C), resp. rate 18, height '5\' 8"'$  (1.727 m), weight 124.9 kg, SpO2 100 %.  Examination:  Physical Exam Constitutional:      General: She is not in acute distress.     Appearance: Normal appearance. She is not ill-appearing.  HENT:     Head: Normocephalic and atraumatic.     Mouth/Throat:     Mouth: Mucous membranes are moist.  Eyes:     Extraocular Movements: Extraocular movements intact.  Cardiovascular:     Rate and Rhythm: Normal rate and regular rhythm.  Pulmonary:     Effort: Pulmonary effort is normal.     Breath sounds: Normal breath sounds.  Abdominal:     General: Bowel sounds are normal. There is no distension.     Palpations: Abdomen is soft.     Tenderness: There is no abdominal tenderness.  Musculoskeletal:        General: Normal range of motion.     Cervical back: Normal range of motion and neck supple.  Skin:    General: Skin is warm and dry.  Neurological:     General: No focal deficit present.     Mental Status: She is alert.  Psychiatric:        Mood and Affect: Mood normal.        Behavior: Behavior normal.      Consultants:  ID  Procedures:    Data Reviewed: Results for orders placed or performed during the hospital encounter of 04/04/22 (from the past 24 hour(s))  Glucose, capillary     Status: None   Collection Time: 04/12/22  9:31 PM  Result Value Ref Range   Glucose-Capillary 85 70 - 99 mg/dL  Glucose, capillary     Status: None   Collection Time: 04/13/22 12:11 AM  Result Value Ref Range   Glucose-Capillary 78 70 - 99 mg/dL  CBC     Status: Abnormal   Collection Time: 04/13/22  2:13 AM  Result Value Ref Range   WBC 7.0 4.0 - 10.5 K/uL   RBC 3.54 (L) 3.87 - 5.11 MIL/uL   Hemoglobin 10.6 (L) 12.0 - 15.0 g/dL   HCT 32.0 (L) 36.0 - 46.0 %   MCV 90.4 80.0 - 100.0 fL   MCH 29.9 26.0 - 34.0 pg   MCHC 33.1 30.0 - 36.0 g/dL   RDW 13.3 11.5 - 15.5 %   Platelets 291 150 - 400 K/uL   nRBC 0.0 0.0 - 0.2 %  Comprehensive metabolic panel     Status: Abnormal   Collection Time: 04/13/22  2:13 AM  Result Value Ref Range   Sodium 139 135 - 145 mmol/L   Potassium 4.0 3.5 - 5.1 mmol/L   Chloride 111 98 - 111  mmol/L   CO2 22 22 - 32 mmol/L   Glucose, Bld 84 70 - 99 mg/dL   BUN 7 6 - 20 mg/dL   Creatinine, Ser 0.71 0.44 - 1.00 mg/dL   Calcium 9.0 8.9 - 10.3 mg/dL   Total Protein 5.8 (L) 6.5 - 8.1 g/dL   Albumin 2.8 (L) 3.5 - 5.0 g/dL   AST 69 (H) 15 - 41 U/L   ALT 123 (H) 0 - 44 U/L   Alkaline Phosphatase 81 38 - 126  U/L   Total Bilirubin 0.5 0.3 - 1.2 mg/dL   GFR, Estimated >60 >60 mL/min   Anion gap 6 5 - 15  Glucose, capillary     Status: None   Collection Time: 04/13/22  4:14 AM  Result Value Ref Range   Glucose-Capillary 75 70 - 99 mg/dL  Glucose, capillary     Status: None   Collection Time: 04/13/22  8:45 AM  Result Value Ref Range   Glucose-Capillary 88 70 - 99 mg/dL  Glucose, capillary     Status: None   Collection Time: 04/13/22 12:02 PM  Result Value Ref Range   Glucose-Capillary 81 70 - 99 mg/dL    I have Reviewed nursing notes, Vitals, and Lab results since pt's last encounter. Pertinent lab results : see above I have ordered test including BMP, CBC, Mg I have reviewed the last note from staff over past 24 hours I have discussed pt's care plan and test results with nursing staff, case manager   LOS: 9 days   Dwyane Dee, MD [facility] 04/13/2022, 4:55 PM

## 2022-04-13 NOTE — Progress Notes (Addendum)
Nutrition Note: Calorie Count  Only 1 meal ticket saved since yesterday. She vomited after eating yesterday evening. Per patient report, she had 1 sausage patty and 2 oz orange juice for breakfast, 1/2 of an Ensure Max Protein for snack, and 1/2 of a [place] sandwich for lunch today. This provides 326 kcal and 31 gm protein. She plans on walking around the unit this afternoon while drinking another Ensure Max Protein. She does not want to have to have another PICC, but is okay with it if she cannot take in enough fluid and protein. Will continue calorie count and RD to follow-up tomorrow with further results and recommendations.   Lucas Mallow RD, LDN, CNSC Please refer to Amion for contact information.

## 2022-04-13 NOTE — Plan of Care (Signed)

## 2022-04-13 NOTE — Progress Notes (Signed)
Mobility Specialist Progress Note   04/13/22 1256  Mobility  Activity Ambulated independently in hallway  Level of Assistance Independent after set-up  Assistive Device None  Distance Ambulated (ft) 770 ft  Activity Response Tolerated well  $Mobility charge 1 Mobility   Pre Mobility: 64 HR, 106/63 BP During Mobility: 76 HR Post Mobility: 61 HR, 119/60 BP  Received pt in bed having no complaints and agreeable to mobility. Pt was asymptomatic throughout ambulation and returned to room w/o fault. Left back in bed w/ call bell in reach and all needs met.  Holland Falling Mobility Specialist [facility] #:  [phone] Acute Rehab Office:  [phone]

## 2022-04-14 ENCOUNTER — Inpatient Hospital Stay: Payer: Self-pay

## 2022-04-14 DIAGNOSIS — R579 Shock, unspecified: Secondary | ICD-10-CM | POA: Diagnosis not present

## 2022-04-14 DIAGNOSIS — R7881 Bacteremia: Secondary | ICD-10-CM | POA: Diagnosis not present

## 2022-04-14 LAB — GLUCOSE, CAPILLARY
Glucose-Capillary: 82 mg/dL (ref 70–99)
Glucose-Capillary: 84 mg/dL (ref 70–99)
Glucose-Capillary: 84 mg/dL (ref 70–99)

## 2022-04-14 LAB — MAGNESIUM: Magnesium: 1.9 mg/dL (ref 1.7–2.4)

## 2022-04-14 LAB — COMPREHENSIVE METABOLIC PANEL
ALT: 129 U/L — ABNORMAL HIGH (ref 0–44)
AST: 90 U/L — ABNORMAL HIGH (ref 15–41)
Albumin: 2.7 g/dL — ABNORMAL LOW (ref 3.5–5.0)
Alkaline Phosphatase: 83 U/L (ref 38–126)
Anion gap: 6 (ref 5–15)
BUN: 5 mg/dL — ABNORMAL LOW (ref 6–20)
CO2: 23 mmol/L (ref 22–32)
Calcium: 8.7 mg/dL — ABNORMAL LOW (ref 8.9–10.3)
Chloride: 109 mmol/L (ref 98–111)
Creatinine, Ser: 0.69 mg/dL (ref 0.44–1.00)
GFR, Estimated: >60 mL/min (ref >60)
Glucose, Bld: 89 mg/dL (ref 70–99)
Potassium: 3.9 mmol/L (ref 3.5–5.1)
Sodium: 138 mmol/L (ref 135–145)
Total Bilirubin: 0.5 mg/dL (ref 0.3–1.2)
Total Protein: 5.6 g/dL — ABNORMAL LOW (ref 6.5–8.1)

## 2022-04-14 LAB — CBC
HCT: 33.4 % — ABNORMAL LOW (ref 36.0–46.0)
Hemoglobin: 10.9 g/dL — ABNORMAL LOW (ref 12.0–15.0)
MCH: 29.7 pg (ref 26.0–34.0)
MCHC: 32.6 g/dL (ref 30.0–36.0)
MCV: 91 fL (ref 80.0–100.0)
Platelets: 262 10*3/uL (ref 150–400)
RBC: 3.67 MIL/uL — ABNORMAL LOW (ref 3.87–5.11)
RDW: 13.2 % (ref 11.5–15.5)
WBC: 5.3 10*3/uL (ref 4.0–10.5)
nRBC: 0 % (ref 0.0–0.2)

## 2022-04-14 MED ORDER — CHLORHEXIDINE GLUCONATE CLOTH 2 % EX PADS
6.0000 | MEDICATED_PAD | Freq: Every day | CUTANEOUS | Status: DC
  Administered 2022-04-14 – 2022-04-15 (×2): 6 via TOPICAL

## 2022-04-14 MED ORDER — SODIUM CHLORIDE 0.9% FLUSH: 10.0000 mL | INTRAVENOUS | Status: DC | PRN

## 2022-04-14 NOTE — Progress Notes (Signed)
Peripherally Inserted Central Catheter Placement  The IV Nurse has discussed with the patient and/or persons authorized to consent for the patient, the purpose of this procedure and the potential benefits and risks involved with this procedure.  The benefits include less needle sticks, lab draws from the catheter, and the patient may be discharged home with the catheter. Risks include, but not limited to, infection, bleeding, blood clot (thrombus formation), and puncture of an artery; nerve damage and irregular heartbeat and possibility to perform a PICC exchange if needed/ordered by physician.  Alternatives to this procedure were also discussed.  Bard Power PICC patient education guide, fact sheet on infection prevention and patient information card has been provided to patient /or left at bedside.    PICC Placement Documentation  PICC Single Lumen 04/14/22 Right Brachial 37 cm 0 cm (Active)  Indication for Insertion or Continuance of Line Administration of hyperosmolar/irritating solutions (i.e. TPN, Vancomycin, etc.) 04/14/22 1607  Exposed Catheter (cm) 0 cm 04/14/22 1607  Site Assessment Clean, Dry, Intact 04/14/22 1607  Line Status Flushed;Saline locked;Blood return noted 04/14/22 1607  Dressing Type Transparent;Securing device 04/14/22 1607  Dressing Status Antimicrobial disc in place 04/14/22 1607  Dressing Intervention New dressing;Other (Comment) 04/14/22 1607  Dressing Change Due 04/14/22 04/14/22 1607       Rebecca Day 04/14/2022, 4:09 PM

## 2022-04-14 NOTE — Progress Notes (Signed)
Calorie Count Note  3 meal tickets provided but no completions documented. Patient reports eating a few bites of [place] breast from her sandwich at lunch yesterday; a few bites of the [place] breast from her sandwich last night; a couple chips; and half of her fruit cup for breakfast this morning. (~86 kcal and 6g protein)  She also received 3 Ensure yesterday and drank 1 full Ensure Max and 1 full Ensure Plus High Protein. (500 kcal and 50g protein)  Diet: Regular  Supplements: Ensure Max po BID, each supplement provides 150 kcal and 30 grams of protein OR Ensure Plus High Protein po BID, each supplement provides 350 kcal and 20 grams of protein.  Total intake: 586 kcal (29% of minimum estimated needs)  56 protein (47% of minimum estimated needs)  Nutrition Dx: Inadequate oral intake related to altered GI function as evidenced by meal completion < 50%.  Goal: Patient will meet greater than or equal to 90% of their needs  Intervention: Spoke with MD regarding patient meal intakes. Would recommend re-initiation of TPN for now given ongoing inability to adequately meet nutrition needs via PO intake alone.  Calorie count discontinued  Clayborne Dana, RDN, LDN Clinical Nutrition

## 2022-04-14 NOTE — Progress Notes (Signed)
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 She presented to the ED 8/6 with fever of 104.4 and septic shock.  Admitted to the ICU for pressors, CT chest abdomen pelvis was negative. Subsequently found to have Staph epidermidis bacteremia, repeat cultures also positive for staph epi, PICC line removed, infectious disease consulted.  She underwent repeat blood cultures on 04/11/2022 which remained negative.  A calorie count was also performed and she was still at a deficit for nutritional needs.  Therefore, she underwent repeat placement of a PICC line on 04/14/2022 in anticipation to continue on TPN at home at discharge.  Interval History:  No events overnight.  Feels improved each day in terms of energy.  Nutritional intake still remains lacking and she was amenable with placing PICC line back and resuming TPN at discharge.  Assessment and Plan:  Septic shock Staph epidermidis bacteremia -Secondary to PICC line infection, treated with IV vancomycin then switched to Daptomycin per infectious disease -ID following, blood cultures positive on 8/6 and 8/11 -PICC line discontinued 8/11, blood cultures from 8/13 negative thus far -Appreciate infectious disease input, recommending transition to IV cefazolin to treat for 5 more days from time of negative culture until 8/17 (if leaves prior then complete course with zyvox) - PICC replaced on 04/14/22   Recent gastric bypass -This was performed by Dr. Evorn Gong at [facility] in [place] in May, after which she had frequent nausea and vomiting, limited p.o. intake, recurrent dehydration requiring saline infusions few times a  week, has been on TPN since June - RD following for calorie count (patient appears to still be short on intake) - PICC replaced 8/16 and she will resume home TPN at discharge and further weaning as able  Abnormal LFTs -Secondary to sepsis, shock, RUQ [place] normal   Old records reviewed in assessment of this patient  Antimicrobials: Ancef  DVT prophylaxis:  Lovenox   Code Status:   Code Status: Full Code  Mobility Assessment (last 72 hours)     Mobility Assessment     Row Name 04/14/22 0800 04/14/22 0757 04/13/22 1945 04/13/22 1003 04/13/22 1000   Does patient have an order for bedrest or is patient medically unstable No - Continue assessment No - Continue assessment No - Continue assessment No - Continue assessment No - Continue assessment   What is the highest level of mobility based on the progressive mobility assessment? Level 6 (Walks independently in room and hall) - Balance while walking in room without assist - Complete Level 6 (Walks independently in room and hall) - Balance while walking in room without assist - Complete Level 6 (Walks independently in room and hall) - Balance while walking in room without assist - Complete Level 6 (Walks independently in room and hall) - Balance while walking in room without assist - Complete Level 6 (Walks independently in room and hall) - Balance while walking in room without assist - Complete    Row Name 04/12/22 1945 04/12/22 0800 04/11/22 2005       Does patient have an order for bedrest or is patient medically unstable No - Continue assessment No - Continue assessment No - Continue assessment  What is the highest level of mobility based on the progressive mobility assessment? Level 6 (Walks independently in room and hall) - Balance while walking in room without assist - Complete Level 6 (Walks independently in room and hall) - Balance while walking in room without assist - Complete Level 6 (Walks independently in room and hall) - Balance  while walking in room without assist - Complete              Barriers to discharge: none Disposition Plan:  Home 1-2 days Status is: Inpat  Objective: Blood pressure (!) 109/57, pulse 61, temperature 98.4 F (36.9 C), temperature source Oral, resp. rate 18, height '5\' 8"'$  (1.727 m), weight 123.2 kg, SpO2 100 %.  Examination:  Physical Exam Constitutional:      General: She is not in acute distress.    Appearance: Normal appearance. She is not ill-appearing.  HENT:     Head: Normocephalic and atraumatic.     Mouth/Throat:     Mouth: Mucous membranes are moist.  Eyes:     Extraocular Movements: Extraocular movements intact.  Cardiovascular:     Rate and Rhythm: Normal rate and regular rhythm.  Pulmonary:     Effort: Pulmonary effort is normal.     Breath sounds: Normal breath sounds.  Abdominal:     General: Bowel sounds are normal. There is no distension.     Palpations: Abdomen is soft.     Tenderness: There is no abdominal tenderness.  Musculoskeletal:        General: Normal range of motion.     Cervical back: Normal range of motion and neck supple.  Skin:    General: Skin is warm and dry.  Neurological:     General: No focal deficit present.     Mental Status: She is alert.  Psychiatric:        Mood and Affect: Mood normal.        Behavior: Behavior normal.      Consultants:  ID  Procedures:    Data Reviewed: Results for orders placed or performed during the hospital encounter of 04/04/22 (from the past 24 hour(s))  Glucose, capillary     Status: None   Collection Time: 04/13/22  5:38 PM  Result Value Ref Range   Glucose-Capillary 71 70 - 99 mg/dL  Glucose, capillary     Status: None   Collection Time: 04/13/22  8:56 PM  Result Value Ref Range   Glucose-Capillary 71 70 - 99 mg/dL  Comprehensive metabolic panel     Status: Abnormal   Collection Time: 04/14/22  3:16 AM  Result Value Ref Range   Sodium 138 135 - 145 mmol/L   Potassium 3.9 3.5 - 5.1  mmol/L   Chloride 109 98 - 111 mmol/L   CO2 23 22 - 32 mmol/L   Glucose, Bld 89 70 - 99 mg/dL   BUN 5 (L) 6 - 20 mg/dL   Creatinine, Ser 0.69 0.44 - 1.00 mg/dL   Calcium 8.7 (L) 8.9 - 10.3 mg/dL   Total Protein 5.6 (L) 6.5 - 8.1 g/dL   Albumin 2.7 (L) 3.5 - 5.0 g/dL   AST 90 (H) 15 - 41 U/L   ALT 129 (H) 0 - 44 U/L   Alkaline Phosphatase 83 38 - 126 U/L   Total Bilirubin 0.5 0.3 - 1.2 mg/dL   GFR, Estimated >60 >60 mL/min   Anion gap 6 5 - 15  Magnesium     Status: None  Collection Time: 04/14/22  3:16 AM  Result Value Ref Range   Magnesium 1.9 1.7 - 2.4 mg/dL  CBC     Status: Abnormal   Collection Time: 04/14/22  5:26 AM  Result Value Ref Range   WBC 5.3 4.0 - 10.5 K/uL   RBC 3.67 (L) 3.87 - 5.11 MIL/uL   Hemoglobin 10.9 (L) 12.0 - 15.0 g/dL   HCT 33.4 (L) 36.0 - 46.0 %   MCV 91.0 80.0 - 100.0 fL   MCH 29.7 26.0 - 34.0 pg   MCHC 32.6 30.0 - 36.0 g/dL   RDW 13.2 11.5 - 15.5 %   Platelets 262 150 - 400 K/uL   nRBC 0.0 0.0 - 0.2 %  Glucose, capillary     Status: None   Collection Time: 04/14/22  7:43 AM  Result Value Ref Range   Glucose-Capillary 82 70 - 99 mg/dL  Glucose, capillary     Status: None   Collection Time: 04/14/22 11:39 AM  Result Value Ref Range   Glucose-Capillary 84 70 - 99 mg/dL    I have Reviewed nursing notes, Vitals, and Lab results since pt's last encounter. Pertinent lab results : see above I have ordered test including BMP, CBC, Mg I have reviewed the last note from staff over past 24 hours I have discussed pt's care plan and test results with nursing staff, case manager   LOS: 10 days   Dwyane Dee, MD [facility] 04/14/2022, 4:23 PM

## 2022-04-14 NOTE — Progress Notes (Signed)
   04/14/22 1308  Mobility  Activity Ambulated independently in hallway  Level of Assistance Independent  Assistive Device None  Distance Ambulated (ft) 500 ft  Activity Response Tolerated well  $Mobility charge 1 Mobility   Mobility Specialist Progress Note  Pre-Mobility: 79 HR, 117/75 BP Post-Mobility: 78 HR, 125/89 BP  Pt was in chair & agreeable. X1 standing break for energy conservation. Left in chair w/ all needs met & call bell in reach.   Lucious Groves Mobility Specialist

## 2022-04-15 DIAGNOSIS — R7881 Bacteremia: Secondary | ICD-10-CM | POA: Diagnosis not present

## 2022-04-15 DIAGNOSIS — R579 Shock, unspecified: Secondary | ICD-10-CM | POA: Diagnosis not present

## 2022-04-15 LAB — COMPREHENSIVE METABOLIC PANEL
ALT: 93 U/L — ABNORMAL HIGH (ref 0–44)
AST: 54 U/L — ABNORMAL HIGH (ref 15–41)
Albumin: 2.6 g/dL — ABNORMAL LOW (ref 3.5–5.0)
Alkaline Phosphatase: 75 U/L (ref 38–126)
Anion gap: 3 — ABNORMAL LOW (ref 5–15)
BUN: 5 mg/dL — ABNORMAL LOW (ref 6–20)
CO2: 25 mmol/L (ref 22–32)
Calcium: 8.6 mg/dL — ABNORMAL LOW (ref 8.9–10.3)
Chloride: 111 mmol/L (ref 98–111)
Creatinine, Ser: 0.78 mg/dL (ref 0.44–1.00)
GFR, Estimated: >60 mL/min (ref >60)
Glucose, Bld: 92 mg/dL (ref 70–99)
Potassium: 3.6 mmol/L (ref 3.5–5.1)
Sodium: 139 mmol/L (ref 135–145)
Total Bilirubin: 0.4 mg/dL (ref 0.3–1.2)
Total Protein: 5.8 g/dL — ABNORMAL LOW (ref 6.5–8.1)

## 2022-04-15 LAB — CBC WITH DIFFERENTIAL/PLATELET
Abs Immature Granulocytes: 0.01 10*3/uL (ref 0.00–0.07)
Basophils Absolute: 0 10*3/uL (ref 0.0–0.1)
Basophils Relative: 1 %
Eosinophils Absolute: 0.1 10*3/uL (ref 0.0–0.5)
Eosinophils Relative: 1 %
HCT: 31.3 % — ABNORMAL LOW (ref 36.0–46.0)
Hemoglobin: 10.4 g/dL — ABNORMAL LOW (ref 12.0–15.0)
Immature Granulocytes: 0 %
Lymphocytes Relative: 52 %
Lymphs Abs: 3.1 10*3/uL (ref 0.7–4.0)
MCH: 30.6 pg (ref 26.0–34.0)
MCHC: 33.2 g/dL (ref 30.0–36.0)
MCV: 92.1 fL (ref 80.0–100.0)
Monocytes Absolute: 0.5 10*3/uL (ref 0.1–1.0)
Monocytes Relative: 9 %
Neutro Abs: 2.2 10*3/uL (ref 1.7–7.7)
Neutrophils Relative %: 37 %
Platelets: 246 10*3/uL (ref 150–400)
RBC: 3.4 MIL/uL — ABNORMAL LOW (ref 3.87–5.11)
RDW: 13.2 % (ref 11.5–15.5)
WBC: 6 10*3/uL (ref 4.0–10.5)
nRBC: 0 % (ref 0.0–0.2)

## 2022-04-15 LAB — GLUCOSE, CAPILLARY: Glucose-Capillary: 87 mg/dL (ref 70–99)

## 2022-04-15 LAB — MAGNESIUM: Magnesium: 1.7 mg/dL (ref 1.7–2.4)

## 2022-04-15 MED ORDER — HEPARIN SOD (PORK) LOCK FLUSH 100 UNIT/ML IV SOLN
250.0000 [IU] | INTRAVENOUS | Status: AC | PRN
  Administered 2022-04-15: 250 [IU]

## 2022-04-15 NOTE — Discharge Summary (Addendum)
Physician Discharge Summary   Rebecca Day WSF:[id number] DOB: 1985-10-15 DOA: 04/04/2022  PCP: [name], FNP  Admit date: 04/04/2022 Discharge date: 04/15/2022  Barriers to discharge: none  Admitted From: Home Disposition:  Home Discharging physician: Dwyane Dee, MD  Recommendations for Outpatient Follow-up:  Follow up with bariatric surgery Continue weaning TPN as able  Home Health: RN Equipment/Devices:   Discharge Condition: stable CODE STATUS: Full Diet recommendation:  Diet Orders (From admission, onward)     Start     Ordered   04/15/22 0000  Diet general        04/15/22 1115   04/12/22 1803  Diet regular Room service appropriate? Yes; Fluid consistency: Thin  Diet effective now       Question Answer Comment  Room service appropriate? 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 She presented to the ED 8/6 with fever of 104.4 and septic shock.  Admitted to the ICU for pressors, CT chest abdomen pelvis was negative. Subsequently found to have Staph epidermidis bacteremia, repeat cultures also positive for staph epi, PICC line removed, infectious disease consulted.  She underwent repeat blood cultures on 04/11/2022 which remained negative.  A calorie count was also performed and she was still at a deficit for nutritional needs.  Therefore, she underwent repeat placement of a PICC line on 04/14/2022 in anticipation to continue on TPN at home at discharge.  Assessment and Plan:  Septic shock Staph epidermidis bacteremia -Secondary to PICC line infection, treated with IV vancomycin then switched to Daptomycin per infectious disease -ID following, blood cultures positive on 8/6 and 8/11 -PICC line discontinued  8/11, blood cultures from 8/13 negative to date -Appreciate infectious disease input, recommending transition to IV cefazolin to treat for 5 more days from time of negative culture until 8/17 (antibiotic course completed in hospital) - PICC replaced on 04/14/22   Recent gastric bypass -This was performed by Dr. Evorn Gong at [facility] in [place] in May, after which she had frequent nausea and vomiting, limited p.o. intake, recurrent dehydration requiring saline infusions few times a week, has been on TPN since June - RD following for calorie count (patient appears to still be short on intake) - PICC replaced 8/16 and she will resume home TPN at discharge and further weaning as able  Abnormal LFTs -Secondary to sepsis, shock, RUQ [place] normal    The patient's chronic medical conditions were treated accordingly per the patient's home medication regimen except as noted.  On day of discharge, patient was felt deemed stable for discharge. Patient/family member advised to call PCP or come back to ER if needed.   Principal Diagnosis: Septic shock [facility])  Discharge Diagnoses: Active Hospital Problems   Diagnosis Date Noted   Septic shock ([place]) 04/04/2022   Bacteremia 04/11/2022   Viral infection 04/08/2022   Headache 04/08/2022   History of gastric bypass 04/08/2022   Elevated liver enzymes 04/08/2022   Fever    Lupus ([place]) 11/02/2018    Resolved Hospital Problems  No resolved problems to display.     Discharge Instructions     Diet general   Complete by: As directed    Increase activity slowly   Complete by: As directed  Allergies as of 04/15/2022       Reactions   Penicillins Hives   Has patient had a PCN reaction causing immediate rash, facial/tongue/throat swelling, SOB or lightheadedness with hypotension: Yes Has patient had a PCN reaction causing severe rash involving mucus membranes or skin necrosis: No Has patient had a PCN reaction that required hospitalization:  Yes Has patient had a PCN reaction occurring within the last 10 years: NO If all of the above answers are "NO", then may proceed with Cephalosporin use.        Medication List     TAKE these medications    sterile water SOLN with amino acids 10 % SOLN 1.3 g/kg, dextrose 70 % SOLN 20 % Inject into the vein every 12 (twelve) hours. 12 hours on - 12 hours off.        Follow-up Information     [name], FNP Follow up.   Specialty: General Practice Contact information: [address] [place] [address] [phone]                Allergies  Allergen Reactions   Penicillins Hives    Has patient had a PCN reaction causing immediate rash, facial/tongue/throat swelling, SOB or lightheadedness with hypotension: Yes Has patient had a PCN reaction causing severe rash involving mucus membranes or skin necrosis: No Has patient had a PCN reaction that required hospitalization: Yes Has patient had a PCN reaction occurring within the last 10 years: NO If all of the above answers are "NO", then may proceed with Cephalosporin use.    Consultations:   Procedures:   Discharge Exam: BP 119/80   Pulse 77   Temp (!) 97.5 F (36.4 C) (Oral)   Resp (!) 22   Ht '5\' 8"'$  (1.727 m)   Wt 122.7 kg   LMP  (LMP Unknown)   SpO2 100%   BMI 41.13 kg/m  Physical Exam Constitutional:      General: She is not in acute distress.    Appearance: Normal appearance. She is not ill-appearing.  HENT:     Head: Normocephalic and atraumatic.     Mouth/Throat:     Mouth: Mucous membranes are moist.  Eyes:     Extraocular Movements: Extraocular movements intact.  Cardiovascular:     Rate and Rhythm: Normal rate and regular rhythm.  Pulmonary:     Effort: Pulmonary effort is normal.     Breath sounds: Normal breath sounds.  Abdominal:     General: Bowel sounds are normal. There is no distension.     Palpations: Abdomen is soft.     Tenderness: There is no abdominal  tenderness.  Musculoskeletal:        General: Normal range of motion.     Cervical back: Normal range of motion and neck supple.     Comments: PICC in place in RUE  Skin:    General: Skin is warm and dry.  Neurological:     General: No focal deficit present.     Mental Status: She is alert.  Psychiatric:        Mood and Affect: Mood normal.        Behavior: Behavior normal.      The results of significant diagnostics from this hospitalization (including imaging, microbiology, ancillary and laboratory) are listed below for reference.   Microbiology: Recent Results (from the past 240 hour(s))  Culture, blood (Routine X 2) w Reflex to ID Panel     Status: Abnormal  Collection Time: 04/09/22 11:25 AM   Specimen: BLOOD  Result Value Ref Range Status   Specimen Description BLOOD BLOOD LEFT WRIST  Final   Special Requests   Final    BOTTLES DRAWN AEROBIC AND ANAEROBIC Blood Culture adequate volume   Culture  Setup Time   Final    GRAM POSITIVE COCCI IN CLUSTERS IN BOTH AEROBIC AND ANAEROBIC BOTTLES CRITICAL VALUE NOTED.  VALUE IS CONSISTENT WITH PREVIOUSLY REPORTED AND CALLED VALUE.    Culture (A)  Final    STAPHYLOCOCCUS EPIDERMIDIS SUSCEPTIBILITIES PERFORMED ON PREVIOUS CULTURE WITHIN THE LAST 5 DAYS. Performed at [facility], [place] [address] [place], [place] [address]    Report Status 04/12/2022 FINAL  Final  Culture, blood (Routine X 2) w Reflex to ID Panel     Status: Abnormal   Collection Time: 04/09/22 11:46 AM   Specimen: BLOOD  Result Value Ref Range Status   Specimen Description BLOOD BLOOD LEFT HAND  Final   Special Requests   Final    BOTTLES DRAWN AEROBIC AND ANAEROBIC Blood Culture results may not be optimal due to an inadequate volume of blood received in culture bottles   Culture  Setup Time   Final    GRAM POSITIVE COCCI IN CLUSTERS IN BOTH AEROBIC AND ANAEROBIC BOTTLES CRITICAL RESULT CALLED TO, READ BACK BY AND VERIFIED WITH:  C/ PHARMD JAMES L.  04/10/22 0650 A. LAFRANCE Performed at [facility], [place] [address], [place], [place] [address]    Culture STAPHYLOCOCCUS EPIDERMIDIS (A)  Final   Report Status 04/12/2022 FINAL  Final   Organism ID, Bacteria STAPHYLOCOCCUS EPIDERMIDIS  Final      Susceptibility   Staphylococcus epidermidis - MIC*    CIPROFLOXACIN <=0.5 SENSITIVE Sensitive     ERYTHROMYCIN <=0.25 SENSITIVE Sensitive     GENTAMICIN <=0.5 SENSITIVE Sensitive     OXACILLIN <=0.25 SENSITIVE Sensitive     TETRACYCLINE 2 SENSITIVE Sensitive     VANCOMYCIN 2 SENSITIVE Sensitive     TRIMETH/SULFA <=10 SENSITIVE Sensitive     CLINDAMYCIN >=8 RESISTANT Resistant     RIFAMPIN <=0.5 SENSITIVE Sensitive     Inducible Clindamycin NEGATIVE Sensitive     * STAPHYLOCOCCUS EPIDERMIDIS  Culture, blood (Routine X 2) w Reflex to ID Panel     Status: None (Preliminary result)   Collection Time: 04/11/22 12:55 AM   Specimen: BLOOD  Result Value Ref Range Status   Specimen Description BLOOD LEFT ARM  Final   Special Requests   Final    BOTTLES DRAWN AEROBIC ONLY Blood Culture results may not be optimal due to an inadequate volume of blood received in culture bottles   Culture   Final    NO GROWTH 4 DAYS Performed at [facility], [place] [address] [place], [place] [address]    Report Status PENDING  Incomplete  Culture, blood (Routine X 2) w Reflex to ID Panel     Status: None (Preliminary result)   Collection Time: 04/11/22  1:03 AM   Specimen: BLOOD  Result Value Ref Range Status   Specimen Description BLOOD LEFT HAND  Final   Special Requests   Final    BOTTLES DRAWN AEROBIC ONLY Blood Culture results may not be optimal due to an inadequate volume of blood received in culture bottles   Culture   Final    NO GROWTH 4 DAYS Performed at [facility], [place] [address] [place], [place] [address]    Report  Status PENDING  Incomplete     Labs: BNP (last 3 results) No results for input(s): "BNP" in the last 8760  hours. Basic Metabolic Panel: Recent Labs  Lab 04/09/22 0733 04/10/22 0122 04/11/22 0055 04/13/22 0213 04/14/22 0316 04/15/22 0323  NA 134* 137 136 139 138 139  K 4.6 4.2 3.9 4.0 3.9 3.6  CL 105 106 108 111 109 111  CO2 23 22 20* '22 23 25  '$ GLUCOSE 115* 86 87 84 89 92  BUN '16 15 10 7 '$ 5* 5*  CREATININE 0.64 0.62 0.75 0.71 0.69 0.78  CALCIUM 9.5 9.3 8.9 9.0 8.7* 8.6*  MG 1.9 1.9 1.9  --  1.9 1.7  PHOS 4.1 4.4 4.2  --   --   --    Liver Function Tests: Recent Labs  Lab 04/10/22 0122 04/11/22 0055 04/13/22 0213 04/14/22 0316 04/15/22 0323  AST 126* 78* 69* 90* 54*  ALT 187* 145* 123* 129* 93*  ALKPHOS 87 83 81 83 75  BILITOT 0.4 0.4 0.5 0.5 0.4  PROT 6.4* 6.1* 5.8* 5.6* 5.8*  ALBUMIN 3.1* 2.9* 2.8* 2.7* 2.6*   No results for input(s): "LIPASE", "AMYLASE" in the last 168 hours. No results for input(s): "AMMONIA" in the last 168 hours. CBC: Recent Labs  Lab 04/11/22 0055 04/13/22 0213 04/14/22 0526 04/15/22 0323  WBC 8.7 7.0 5.3 6.0  NEUTROABS 4.7  --   --  2.2  HGB 11.5* 10.6* 10.9* 10.4*  HCT 34.3* 32.0* 33.4* 31.3*  MCV 89.3 90.4 91.0 92.1  PLT 270 291 262 246   Cardiac Enzymes: No results for input(s): "CKTOTAL", "CKMB", "CKMBINDEX", "TROPONINI" in the last 168 hours. BNP: Invalid input(s): "POCBNP" CBG: Recent Labs  Lab 04/13/22 2056 04/14/22 0743 04/14/22 1139 04/14/22 2141 04/15/22 0849  GLUCAP 71 82 84 84 87   D-Dimer No results for input(s): "DDIMER" in the last 72 hours. Hgb A1c No results for input(s): "HGBA1C" in the last 72 hours. Lipid Profile No results for input(s): "CHOL", "HDL", "LDLCALC", "TRIG", "CHOLHDL", "LDLDIRECT" in the last 72 hours. Thyroid function studies No results for input(s): "TSH", "T4TOTAL", "T3FREE", "THYROIDAB" in the last 72 hours.  Invalid input(s): "FREET3" Anemia work up No results for input(s): "VITAMINB12", "FOLATE", "FERRITIN", "TIBC", "IRON", "RETICCTPCT" in the last 72 hours. Urinalysis    Component  Value Date/Time   COLORURINE YELLOW 04/04/2022 1847   APPEARANCEUR HAZY (A) 04/04/2022 1847   LABSPEC 1.012 04/04/2022 1847   PHURINE 5.0 04/04/2022 1847   GLUCOSEU NEGATIVE 04/04/2022 1847   HGBUR SMALL (A) 04/04/2022 1847   BILIRUBINUR NEGATIVE 04/04/2022 1847   BILIRUBINUR negative 05/24/2019 1016   KETONESUR NEGATIVE 04/04/2022 1847   PROTEINUR NEGATIVE 04/04/2022 1847   UROBILINOGEN 0.2 09/12/2019 1749   NITRITE NEGATIVE 04/04/2022 1847   LEUKOCYTESUR NEGATIVE 04/04/2022 1847   Sepsis Labs Recent Labs  Lab 04/11/22 0055 04/13/22 0213 04/14/22 0526 04/15/22 0323  WBC 8.7 7.0 5.3 6.0   Microbiology Recent Results (from the past 240 hour(s))  Culture, blood (Routine X 2) w Reflex to ID Panel     Status: Abnormal   Collection Time: 04/09/22 11:25 AM   Specimen: BLOOD  Result Value Ref Range Status   Specimen Description BLOOD BLOOD LEFT WRIST  Final   Special Requests   Final    BOTTLES DRAWN AEROBIC AND ANAEROBIC Blood Culture adequate volume   Culture  Setup Time   Final    GRAM POSITIVE COCCI IN CLUSTERS IN BOTH AEROBIC AND ANAEROBIC BOTTLES CRITICAL VALUE NOTED.  VALUE IS CONSISTENT WITH PREVIOUSLY REPORTED AND CALLED VALUE.    Culture (A)  Final    STAPHYLOCOCCUS EPIDERMIDIS SUSCEPTIBILITIES PERFORMED ON PREVIOUS CULTURE WITHIN THE LAST 5 DAYS. Performed at [facility], [place] [address], [place], [place] [address]    Report Status 04/12/2022 FINAL  Final  Culture, blood (Routine X 2) w Reflex to ID Panel     Status: Abnormal   Collection Time: 04/09/22 11:46 AM   Specimen: BLOOD  Result Value Ref Range Status   Specimen Description BLOOD BLOOD LEFT HAND  Final   Special Requests   Final    BOTTLES DRAWN AEROBIC AND ANAEROBIC Blood Culture results may not be optimal due to an inadequate volume of blood received in culture bottles   Culture  Setup Time   Final    GRAM POSITIVE COCCI IN CLUSTERS IN BOTH AEROBIC AND ANAEROBIC BOTTLES CRITICAL RESULT CALLED  TO, READ BACK BY AND VERIFIED WITH:  C/ PHARMD JAMES L. 04/10/22 0650 A. LAFRANCE Performed at [facility], [place] [address], [place], [place] [address]    Culture STAPHYLOCOCCUS EPIDERMIDIS (A)  Final   Report Status 04/12/2022 FINAL  Final   Organism ID, Bacteria STAPHYLOCOCCUS EPIDERMIDIS  Final      Susceptibility   Staphylococcus epidermidis - MIC*    CIPROFLOXACIN <=0.5 SENSITIVE Sensitive     ERYTHROMYCIN <=0.25 SENSITIVE Sensitive     GENTAMICIN <=0.5 SENSITIVE Sensitive     OXACILLIN <=0.25 SENSITIVE Sensitive     TETRACYCLINE 2 SENSITIVE Sensitive     VANCOMYCIN 2 SENSITIVE Sensitive     TRIMETH/SULFA <=10 SENSITIVE Sensitive     CLINDAMYCIN >=8 RESISTANT Resistant     RIFAMPIN <=0.5 SENSITIVE Sensitive     Inducible Clindamycin NEGATIVE Sensitive     * STAPHYLOCOCCUS EPIDERMIDIS  Culture, blood (Routine X 2) w Reflex to ID Panel     Status: None (Preliminary result)   Collection Time: 04/11/22 12:55 AM   Specimen: BLOOD  Result Value Ref Range Status   Specimen Description BLOOD LEFT ARM  Final   Special Requests   Final    BOTTLES DRAWN AEROBIC ONLY Blood Culture results may not be optimal due to an inadequate volume of blood received in culture bottles   Culture   Final    NO GROWTH 4 DAYS Performed at [facility], [place] [address] [place], [place] [address]    Report Status PENDING  Incomplete  Culture, blood (Routine X 2) w Reflex to ID Panel     Status: None (Preliminary result)   Collection Time: 04/11/22  1:03 AM   Specimen: BLOOD  Result Value Ref Range Status   Specimen Description BLOOD LEFT HAND  Final   Special Requests   Final    BOTTLES DRAWN AEROBIC ONLY Blood Culture results may not be optimal due to an inadequate volume of blood received in culture bottles   Culture   Final    NO GROWTH 4 DAYS Performed at [facility], [place] [address], [place], [place] [address]    Report Status PENDING  Incomplete    Procedures/Studies: [place]  EKG SITE RITE  Result Date: 04/14/2022 If Site Rite image not attached, placement could not be confirmed due to current cardiac rhythm.  US Abdomen Limited RUQ (LIVER/GB)  Result Date: 04/08/2022 CLINICAL DATA:  Elevated liver function tests EXAM: ULTRASOUND ABDOMEN LIMITED RIGHT UPPER QUADRANT COMPARISON:  04/04/2022 FINDINGS: Gallbladder: Echogenic material within the gallbladder consistent with tumefactive sludge.  No evidence of cholelithiasis. No gallbladder wall thickening or pericholecystic fluid. Negative sonographic Murphy sign. Common bile duct: Diameter: 3 mm Liver: No focal lesion identified. Within normal limits in parenchymal echogenicity. Portal vein is patent on color Doppler imaging with normal direction of blood flow towards the liver. Other: None. IMPRESSION: 1. Gallbladder sludge. No evidence of cholelithiasis or cholecystitis. 2. Otherwise unremarkable right upper quadrant ultrasound. Electronically Signed   By: Randa Ngo M.D.   On: 04/08/2022 15:48   ECHOCARDIOGRAM COMPLETE  Result Date: 04/05/2022    ECHOCARDIOGRAM REPORT   Patient Name:   Rebecca Day Date of Exam: 04/05/2022 Medical Rec #:  [id number]      Height:       68.0 in Accession #:    [id number]     Weight:       281.5 lb Date of Birth:  1985-09-18      BSA:          2.363 m Patient Age:    36 years       BP:           119/65 mmHg Patient Gender: F              HR:           62 bpm. Exam Location:  Inpatient Procedure: 2D Echo, Cardiac Doppler and Color Doppler Indications:    Shock  History:        Patient has no prior history of Echocardiogram examinations.                 Lupus, clotting disorder.  Sonographer:    Eartha Inch Referring Phys: Hillery Aldo, S IMPRESSIONS  1. Left ventricular ejection fraction, by estimation, is 60 to 65%. The left ventricle has normal function. The left ventricle has no regional wall motion abnormalities. Left ventricular diastolic parameters were normal.  2. Right ventricular  systolic function is normal. The right ventricular size is normal. There is normal pulmonary artery systolic pressure.  3. The mitral valve is normal in structure. Mild mitral valve regurgitation. No evidence of mitral stenosis.  4. The aortic valve is normal in structure. Aortic valve regurgitation is not visualized. No aortic stenosis is present.  5. The inferior vena cava is normal in size with greater than 50% respiratory variability, suggesting right atrial pressure of 3 mmHg. FINDINGS  Left Ventricle: Left ventricular ejection fraction, by estimation, is 60 to 65%. The left ventricle has normal function. The left ventricle has no regional wall motion abnormalities. The left ventricular internal cavity size was normal in size. There is  no left ventricular hypertrophy. Left ventricular diastolic parameters were normal. Right Ventricle: The right ventricular size is normal. No increase in right ventricular wall thickness. Right ventricular systolic function is normal. There is normal pulmonary artery systolic pressure. The tricuspid regurgitant velocity is 2.45 m/s, and  with an assumed right atrial pressure of 3 mmHg, the estimated right ventricular systolic pressure is 67.1 mmHg. Left Atrium: Left atrial size was normal in size. Right Atrium: Right atrial size was normal in size. Pericardium: There is no evidence of pericardial effusion. Mitral Valve: The mitral valve is normal in structure. Mild mitral valve regurgitation. No evidence of mitral valve stenosis. MV peak gradient, 4.2 mmHg. The mean mitral valve gradient is 1.0 mmHg. Tricuspid Valve: The tricuspid valve is normal in structure. Tricuspid valve regurgitation is mild . No evidence of tricuspid stenosis. Aortic Valve: The aortic valve is normal in structure. Aortic valve  regurgitation is not visualized. No aortic stenosis is present. Pulmonic Valve: The pulmonic valve was normal in structure. Pulmonic valve regurgitation is not visualized. No  evidence of pulmonic stenosis. Aorta: The aortic root is normal in size and structure. Venous: The inferior vena cava is normal in size with greater than 50% respiratory variability, suggesting right atrial pressure of 3 mmHg. IAS/Shunts: No atrial level shunt detected by color flow Doppler.  LEFT VENTRICLE PLAX 2D LVIDd:         4.90 cm      Diastology LVIDs:         3.10 cm      LV e' medial:    11.90 cm/s LV PW:         0.80 cm      LV E/e' medial:  8.9 LV IVS:        0.80 cm      LV e' lateral:   17.50 cm/s LVOT diam:     2.40 cm      LV E/e' lateral: 6.1 LV SV:         116 LV SV Index:   49 LVOT Area:     4.52 cm  LV Volumes (MOD) LV vol d, MOD A2C: 111.0 ml LV vol d, MOD A4C: 80.0 ml LV vol s, MOD A2C: 43.9 ml LV vol s, MOD A4C: 35.9 ml LV SV MOD A2C:     67.1 ml LV SV MOD A4C:     80.0 ml LV SV MOD BP:      54.4 ml RIGHT VENTRICLE             IVC RV S prime:     14.10 cm/s  IVC diam: 1.90 cm TAPSE (M-mode): 2.5 cm LEFT ATRIUM             Index        RIGHT ATRIUM           Index LA diam:        3.40 cm 1.44 cm/m   RA Area:     19.00 cm LA Vol (A2C):   43.2 ml 18.28 ml/m  RA Volume:   56.60 ml  23.95 ml/m LA Vol (A4C):   54.7 ml 23.14 ml/m LA Biplane Vol: 51.8 ml 21.92 ml/m  AORTIC VALVE LVOT Vmax:   127.00 cm/s LVOT Vmean:  88.300 cm/s LVOT VTI:    0.257 m  AORTA Ao Root diam: 3.30 cm Ao Asc diam:  3.20 cm MITRAL VALVE                TRICUSPID VALVE MV Area (PHT): 3.95 cm     TR Peak grad:   24.0 mmHg MV Area VTI:   4.27 cm     TR Vmax:        245.00 cm/s MV Peak grad:  4.2 mmHg MV Mean grad:  1.0 mmHg     SHUNTS MV Vmax:       1.03 m/s     Systemic VTI:  0.26 m MV Vmean:      52.7 cm/s    Systemic Diam: 2.40 cm MV Decel Time: 192 msec MV E velocity: 106.00 cm/s MV A velocity: 37.20 cm/s MV E/A ratio:  2.85 Candee Furbish MD Electronically signed by Candee Furbish MD Signature Date/Time: 04/05/2022/2:01:01 PM    Final    VAS [place] LOWER EXTREMITY VENOUS (DVT)  Result Date: 04/05/2022  Lower Venous DVT Study  Patient Name:  Rebecca Day  Date  of Exam:   04/05/2022 Medical Rec #: [id number]       Accession #:    [id number] Date of Birth: 1985-10-05       Patient Gender: F Patient Age:   70 years Exam Location:  [facility] Procedure:      VAS [place] LOWER EXTREMITY VENOUS (DVT) Referring Phys: Collier Bullock --------------------------------------------------------------------------------  Indications: Edema, FUO, s/p gastric bypass surgery x1 month.  Comparison Study: No prior studies. Performing Technologist: Darlin Coco RDMS, RVT  Examination Guidelines: A complete evaluation includes B-mode imaging, spectral Doppler, color Doppler, and power Doppler as needed of all accessible portions of each vessel. Bilateral testing is considered an integral part of a complete examination. Limited examinations for reoccurring indications may be performed as noted. The reflux portion of the exam is performed with the patient in reverse Trendelenburg.  +---------+---------------+---------+-----------+----------+--------------+ RIGHT    CompressibilityPhasicitySpontaneityPropertiesThrombus Aging +---------+---------------+---------+-----------+----------+--------------+ CFV      Full           Yes      Yes                                 +---------+---------------+---------+-----------+----------+--------------+ SFJ      Full                                                        +---------+---------------+---------+-----------+----------+--------------+ FV Prox  Full                                                        +---------+---------------+---------+-----------+----------+--------------+ FV Mid   Full                                                        +---------+---------------+---------+-----------+----------+--------------+ FV DistalFull                                                        +---------+---------------+---------+-----------+----------+--------------+ PFV       Full                                                        +---------+---------------+---------+-----------+----------+--------------+ POP      Full           Yes      Yes                                 +---------+---------------+---------+-----------+----------+--------------+ PTV      Full                                                        +---------+---------------+---------+-----------+----------+--------------+  PERO     Full                                                        +---------+---------------+---------+-----------+----------+--------------+ Gastroc  Full                                                        +---------+---------------+---------+-----------+----------+--------------+   +---------+---------------+---------+-----------+----------+--------------+ LEFT     CompressibilityPhasicitySpontaneityPropertiesThrombus Aging +---------+---------------+---------+-----------+----------+--------------+ CFV      Full           Yes      Yes                                 +---------+---------------+---------+-----------+----------+--------------+ SFJ      Full                                                        +---------+---------------+---------+-----------+----------+--------------+ FV Prox  Full                                                        +---------+---------------+---------+-----------+----------+--------------+ FV Mid   Full                                                        +---------+---------------+---------+-----------+----------+--------------+ FV DistalFull                                                        +---------+---------------+---------+-----------+----------+--------------+ PFV      Full                                                        +---------+---------------+---------+-----------+----------+--------------+ POP      Full           Yes      Yes                                  +---------+---------------+---------+-----------+----------+--------------+ PTV      Full                                                        +---------+---------------+---------+-----------+----------+--------------+  PERO     Full                                                        +---------+---------------+---------+-----------+----------+--------------+ Gastroc  Full                                                        +---------+---------------+---------+-----------+----------+--------------+     Summary: RIGHT: - There is no evidence of deep vein thrombosis in the lower extremity.  - No cystic structure found in the popliteal fossa.  LEFT: - There is no evidence of deep vein thrombosis in the lower extremity.  - No cystic structure found in the popliteal fossa.  *See table(s) above for measurements and observations. Electronically signed by Servando Snare MD on 04/05/2022 at 1:35:26 PM.    Final    CT ABDOMEN PELVIS W CONTRAST  Result Date: 04/04/2022 CLINICAL DATA:  Abdominal pain, post-op; Pulmonary embolism (PE) suspected, high prob EXAM: CT ANGIOGRAPHY CHEST CT ABDOMEN AND PELVIS WITH CONTRAST TECHNIQUE: Multidetector CT imaging of the chest was performed using the standard protocol during bolus administration of intravenous contrast. Multiplanar CT image reconstructions and MIPs were obtained to evaluate the vascular anatomy. Multidetector CT imaging of the abdomen and pelvis was performed using the standard protocol during bolus administration of intravenous contrast. RADIATION DOSE REDUCTION: This exam was performed according to the departmental dose-optimization program which includes automated exposure control, adjustment of the mA and/or kV according to patient size and/or use of iterative reconstruction technique. CONTRAST:  123m OMNIPAQUE IOHEXOL 350 MG/ML SOLN COMPARISON:  August 31, 2021 FINDINGS: CTA CHEST FINDINGS Cardiovascular: Limited assessment  of the pulmonary arteries secondary to suboptimal contrast timing. No pulmonary embolism through the proximal segmental pulmonary arteries. Heart is normal in size. No pericardial effusion. Aorta is normal in course and caliber. Mediastinum/Nodes: No axillary or mediastinal adenopathy. Thyroid is unremarkable. Lungs/Pleura: No pleural effusion or pneumothorax. Mild bibasilar atelectasis. There are a few scattered ground-glass opacities in bilateral lung bases and in LEFT upper lobe. Musculoskeletal: No chest wall abnormality. No acute or significant osseous findings. Review of the MIP images confirms the above findings. CT ABDOMEN and PELVIS FINDINGS Hepatobiliary: No focal hepatic lesion. Gallbladder is distended without ancillary evidence of acute cholecystitis. No intrahepatic or extrahepatic biliary ductal dilation. Pancreas: Unremarkable. No pancreatic ductal dilatation or surrounding inflammatory changes. Spleen: Normal in size without focal abnormality. Adrenals/Urinary Tract: Adrenal glands are unremarkable. There is mild bilateral hydronephrosis with eventual excretion on delayed images. The bladder is distended and measures over 12 cm in craniocaudal extent. No obstructing nephrolithiasis. No suspicious renal lesions. Stomach/Bowel: Status post gastric bypass. No evidence of bowel obstruction. No focal drainable fluid collection adjacent to the postsurgical margins. Appendix is normal. Vascular/Lymphatic: Aorta is normal in course and caliber. No suspicious lymphadenopathy. Retroaortic LEFT renal vein. Reproductive: Uterus and bilateral adnexa are unremarkable. Other: No free air or free fluid. Musculoskeletal: No acute or significant osseous findings. Review of the MIP images confirms the above findings. IMPRESSION: 1. The bladder is distended with mild upstream hydronephrosis bilaterally. This is likely due to urinary retention. 2. No acute pulmonary embolism. 3. Scattered bilateral ground-glass  opacities, likely infectious or inflammatory. 4. No evidence of bowel obstruction. Electronically Signed   By: Valentino Saxon M.D.   On: 04/04/2022 19:06   CT Angio Chest PE W and/or Wo Contrast  Result Date: 04/04/2022 CLINICAL DATA:  Abdominal pain, post-op; Pulmonary embolism (PE) suspected, high prob EXAM: CT ANGIOGRAPHY CHEST CT ABDOMEN AND PELVIS WITH CONTRAST TECHNIQUE: Multidetector CT imaging of the chest was performed using the standard protocol during bolus administration of intravenous contrast. Multiplanar CT image reconstructions and MIPs were obtained to evaluate the vascular anatomy. Multidetector CT imaging of the abdomen and pelvis was performed using the standard protocol during bolus administration of intravenous contrast. RADIATION DOSE REDUCTION: This exam was performed according to the departmental dose-optimization program which includes automated exposure control, adjustment of the mA and/or kV according to patient size and/or use of iterative reconstruction technique. CONTRAST:  120m OMNIPAQUE IOHEXOL 350 MG/ML SOLN COMPARISON:  August 31, 2021 FINDINGS: CTA CHEST FINDINGS Cardiovascular: Limited assessment of the pulmonary arteries secondary to suboptimal contrast timing. No pulmonary embolism through the proximal segmental pulmonary arteries. Heart is normal in size. No pericardial effusion. Aorta is normal in course and caliber. Mediastinum/Nodes: No axillary or mediastinal adenopathy. Thyroid is unremarkable. Lungs/Pleura: No pleural effusion or pneumothorax. Mild bibasilar atelectasis. There are a few scattered ground-glass opacities in bilateral lung bases and in LEFT upper lobe. Musculoskeletal: No chest wall abnormality. No acute or significant osseous findings. Review of the MIP images confirms the above findings. CT ABDOMEN and PELVIS FINDINGS Hepatobiliary: No focal hepatic lesion. Gallbladder is distended without ancillary evidence of acute cholecystitis. No intrahepatic  or extrahepatic biliary ductal dilation. Pancreas: Unremarkable. No pancreatic ductal dilatation or surrounding inflammatory changes. Spleen: Normal in size without focal abnormality. Adrenals/Urinary Tract: Adrenal glands are unremarkable. There is mild bilateral hydronephrosis with eventual excretion on delayed images. The bladder is distended and measures over 12 cm in craniocaudal extent. No obstructing nephrolithiasis. No suspicious renal lesions. Stomach/Bowel: Status post gastric bypass. No evidence of bowel obstruction. No focal drainable fluid collection adjacent to the postsurgical margins. Appendix is normal. Vascular/Lymphatic: Aorta is normal in course and caliber. No suspicious lymphadenopathy. Retroaortic LEFT renal vein. Reproductive: Uterus and bilateral adnexa are unremarkable. Other: No free air or free fluid. Musculoskeletal: No acute or significant osseous findings. Review of the MIP images confirms the above findings. IMPRESSION: 1. The bladder is distended with mild upstream hydronephrosis bilaterally. This is likely due to urinary retention. 2. No acute pulmonary embolism. 3. Scattered bilateral ground-glass opacities, likely infectious or inflammatory. 4. No evidence of bowel obstruction. Electronically Signed   By: SValentino SaxonM.D.   On: 04/04/2022 19:06   CT HEAD WO CONTRAST (5MM)  Result Date: 04/04/2022 CLINICAL DATA:  New or worsening headache. EXAM: CT HEAD WITHOUT CONTRAST TECHNIQUE: Contiguous axial images were obtained from the base of the skull through the vertex without intravenous contrast. RADIATION DOSE REDUCTION: This exam was performed according to the departmental dose-optimization program which includes automated exposure control, adjustment of the mA and/or kV according to patient size and/or use of iterative reconstruction technique. COMPARISON:  September 28, 2014 FINDINGS: Brain: No evidence of acute infarction, hemorrhage, hydrocephalus, extra-axial collection  or mass lesion/mass effect. Vascular: No hyperdense vessel or unexpected calcification. Skull: Normal. Negative for fracture or focal lesion. Sinuses/Orbits: No acute finding. Other: None. IMPRESSION: No acute intracranial abnormality. Electronically Signed   By: DFidela SalisburyM.D.   On: 04/04/2022 18:54   DG Chest Port 1 View  Result Date:  04/04/2022 CLINICAL DATA:  Fever EXAM: PORTABLE CHEST 1 VIEW COMPARISON:  12/04/2021 FINDINGS: 1537 hours. The lungs are clear without focal pneumonia, edema, pneumothorax or pleural effusion. The cardiopericardial silhouette is within normal limits for size. Right PICC line tip overlies the mid SVC level. Telemetry leads overlie the chest. IMPRESSION: No active disease. Electronically Signed   By: Misty Stanley M.D.   On: 04/04/2022 16:06     Time coordinating discharge: Over 30 minutes    Dwyane Dee, MD  [facility] 04/15/2022, 4:35 PM

## 2022-04-15 NOTE — Plan of Care (Signed)
  Problem: Education: Goal: Knowledge of General Education information will improve Description: Including pain rating scale, medication(s)/side effects and non-pharmacologic comfort measures 04/15/2022 1520 by Trixie Deis, RN Outcome: Progressing 04/15/2022 0743 by Trixie Deis, RN Outcome: Progressing   Problem: Activity: Goal: Risk for activity intolerance will decrease Outcome: Progressing   Problem: Pain Managment: Goal: General experience of comfort will improve 04/15/2022 1520 by Trixie Deis, RN Outcome: Progressing 04/15/2022 0743 by Trixie Deis, RN Outcome: Progressing   Problem: Safety: Goal: Ability to remain free from injury will improve 04/15/2022 1520 by Trixie Deis, RN Outcome: Progressing 04/15/2022 0743 by Trixie Deis, RN Outcome: Progressing   Problem: Skin Integrity: Goal: Risk for impaired skin integrity will decrease 04/15/2022 1520 by Trixie Deis, RN Outcome: Progressing 04/15/2022 0743 by Trixie Deis, RN Outcome: Progressing

## 2022-04-15 NOTE — TOC Transition Note (Addendum)
Transition of Care [facility]) - CM/SW Discharge Note   Patient Details  Name: Rebecca Day MRN: [id number] Date of Birth: November 26, 1985  Transition of Care [facility]) CM/SW Contact:  Sharin Mons, RN Phone Number: 04/15/2022, 11:45 AM   Clinical Narrative:    Patient will DC to: home Anticipated DC date: 04/15/2022 Family notified: yes Transport by: car  Admitted with septic shock, staph epidermidis bacteremia secondary to PICC line infection. Per MD patient ready for DC today . RN, patient, patient's family, and [facility] notified of DC. Pt from home with  children. States sister and brother will assist with care if needed once d/c. TPN resumption order faxed to [facility] @ [phone] per MD. [facility] resumption order faxed to [facility] @ [phone] per NCM. Success faxed confirmation received for both.  Post hospital f/u noted on AVS. Pt without Rx med concerns. Sister to provide transportation to home.  04/15/2022 @ 1428 NCM received call from [facility] liaison , Tammy [phone]). Tammy informed NCM resumption --order received and SOC ( delivery of TPN) to begin this evening.  RNCM will sign off for now as intervention is no longer needed. Please consult [place] again if new needs arise.    Final next level of care: [place] Barriers to Discharge: No Barriers Identified   Patient Goals and CMS Choice Patient states their goals for this hospitalization and ongoing recovery are:: To return home CMS Medicare.gov Compare Post Acute Care list provided to:: Patient    Discharge Placement                       Discharge Plan and Services In-house Referral: Chaplain Discharge Planning Services: CM Consult                      HH Arranged: RN [facility] Agency: Other - See comment ([place]) Date [place]: 04/15/22 Time [place]: 8338    Social Determinants of Health  (SDOH) Interventions     Readmission Risk Interventions     No data to display

## 2022-04-15 NOTE — Plan of Care (Signed)

## 2022-04-15 NOTE — Progress Notes (Signed)
Mobility Specialist Progress Note   04/15/22 1318  Mobility  Activity Ambulated independently in hallway  Level of Assistance Independent after set-up  Assistive Device None  Distance Ambulated (ft) 770 ft  Activity Response Tolerated well  $Mobility charge 1 Mobility   Received pt in chair having no complaints and agreeable to mobility. Pt was asymptomatic throughout ambulation and returned to room w/o fault. Left in chair w/ call bell in reach and all needs met.  Holland Falling Mobility Specialist [facility] #:  [phone] Acute Rehab Office:  [phone]

## 2022-04-15 NOTE — Plan of Care (Signed)

## 2022-04-16 ENCOUNTER — Telehealth: Payer: Self-pay

## 2022-04-16 LAB — CULTURE, BLOOD (ROUTINE X 2)
Culture: NO GROWTH
Culture: NO GROWTH

## 2022-04-16 NOTE — Telephone Encounter (Signed)
Transition Care Management Follow-up Telephone Call Date of discharge and from where: 04/16/2022 Rebecca Day How have you been since you were released from the hospital? Feeling a little tired but much better Any questions or concerns? No  Items Reviewed: Did the pt receive and understand the discharge instructions provided? Yes  Medications obtained and verified? Yes  Other? No  Any new allergies since your discharge? No  Dietary orders reviewed? Yes Do you have support at home? Yes   Home Care and Equipment/Supplies: Were home health services ordered? not applicable If so, what is the name of the agency? N/a  Has the agency set up a time to come to the patient's home? not applicable Were any new equipment or medical supplies ordered?  No What is the name of the medical supply agency? N/a Were you able to get the supplies/equipment? not applicable Do you have any questions related to the use of the equipment or supplies? No  Functional Questionnaire: (I = Independent and D = Dependent) ADLs: I  Bathing/Dressing- I  Meal Prep- I  Eating- I  Maintaining continence- I  Transferring/Ambulation- I  Managing Meds- I  Follow up appointments reviewed:  PCP Hospital f/u appt confirmed? No  Message sent to [facility] to see about getting patient scheduled for a follow up appointment. Are transportation arrangements needed? No  If their condition worsens, is the pt aware to call PCP or go to the Emergency Dept.? Yes Was the patient provided with contact information for the PCP's office or ED? Yes Was to pt encouraged to call back with questions or concerns? Yes

## 2022-04-22 ENCOUNTER — Telehealth: Payer: Self-pay

## 2022-04-22 NOTE — Telephone Encounter (Signed)
Error

## 2022-04-26 ENCOUNTER — Encounter: Payer: Self-pay | Admitting: Nurse Practitioner

## 2022-04-26 ENCOUNTER — Ambulatory Visit (INDEPENDENT_AMBULATORY_CARE_PROVIDER_SITE_OTHER): Payer: No Typology Code available for payment source | Admitting: Nurse Practitioner

## 2022-04-26 VITALS — BP 130/68 | HR 80 | Temp 98.0°F | Ht 68.0 in | Wt 273.0 lb

## 2022-04-26 DIAGNOSIS — R109 Unspecified abdominal pain: Secondary | ICD-10-CM

## 2022-04-26 DIAGNOSIS — Z789 Other specified health status: Secondary | ICD-10-CM

## 2022-04-26 DIAGNOSIS — A419 Sepsis, unspecified organism: Secondary | ICD-10-CM

## 2022-04-26 DIAGNOSIS — J189 Pneumonia, unspecified organism: Secondary | ICD-10-CM | POA: Diagnosis not present

## 2022-04-26 DIAGNOSIS — R6521 Severe sepsis with septic shock: Secondary | ICD-10-CM | POA: Diagnosis not present

## 2022-04-26 DIAGNOSIS — Z9884 Bariatric surgery status: Secondary | ICD-10-CM

## 2022-04-26 DIAGNOSIS — R339 Retention of urine, unspecified: Secondary | ICD-10-CM

## 2022-04-26 DIAGNOSIS — Z6841 Body Mass Index (BMI) 40.0 and over, adult: Secondary | ICD-10-CM

## 2022-04-26 NOTE — Patient Instructions (Signed)
Sepsis, Diagnosis, Adult Sepsis is a serious bodily reaction to an infection. The infection that triggers sepsis may be from a bacteria, virus, or fungus. Sepsis can result from an infection in any part of your body. Infections that commonly lead to sepsis include skin, lung, and urinary tract infections. Sepsis is a medical emergency that must be treated right away in a hospital. In severe cases, it can lead to septic shock. Septic shock can weaken your heart and cause your blood pressure to drop. This can cause your central nervous system and your body's organs to stop working. What are the causes? This condition is caused by a severe reaction to infections from bacteria, viruses, or fungus. The germs that most often lead to sepsis include: Escherichia coli (E. coli) bacteria. Staphylococcus aureus (staph) bacteria. Some types of Streptococcus bacteria. The most common infections affect these organs: The lung (pneumonia). The kidneys or bladder (urinary tract infection). The skin (cellulitis). The bowel, gallbladder, or pancreas. What increases the risk? You are more likely to develop this condition if: Your body's disease-fighting system (immune system) is weakened. You are age 65 or older. You are female. You had surgery or you have been hospitalized. You have these devices inserted into your body: A small, thin tube (catheter). IV line. Breathing tube. Drainage tube. You are not getting enough nutrients from food (malnourished). You have a chronic disease, such as cancer, lung disease, kidney disease, or diabetes. What are the signs or symptoms? Symptoms of this condition may include: Fever. Chills or feeling very cold. Confusion or anxiety. Fatigue. Muscle aches. Shortness of breath or rapid breathing (hyperventilation). Nausea and vomiting. Urinating much less than usual. Fast heart rate. Changes in skin color. Your skin may look blotchy, pale, or blue. Cool, clammy, or  sweaty skin. Skin rash. Other symptoms depend on the source of your infection. How is this diagnosed? This condition is diagnosed based on your symptoms, medical history, and physical exam. Other tests may also be done to find out the cause of the infection and how severe the sepsis is. Tests may include: Blood tests. Urine tests. Swabs from other areas of your body that may have an infection. These samples may be tested (cultured) to find out what type of bacteria is causing the infection. Chest X-ray to check for pneumonia. Other imaging tests, such as a CT scan, may also be done. Lumbar puncture. This removes a small amount of the fluid that surrounds your brain and spinal cord. The fluid is then examined for infection. How is this treated? This condition must be treated in a hospital. Based on the cause of your infection, you may be given an antibiotic, antiviral, or antifungal medicine. You may also receive: Fluids through an IV. Oxygen and breathing assistance. Medicines to increase your blood pressure. Kidney dialysis. This process cleans your blood if your kidneys have failed. Surgery to remove infected tissue. Blood transfusion if needed. Medicine to prevent blood clots. Nutrients to correct imbalances in basic body function (metabolism). You may: Receive important salts and minerals (electrolytes) through an IV. Have your blood sugar level adjusted. Follow these instructions at home: Medicines  Take over-the-counter and prescription medicines only as told by your health care provider. If you were prescribed an antibiotic, antiviral, or antifungal medicine, take it as told by your health care provider. Do not stop taking the medicine even if you start to feel better. General instructions If you have a catheter or other indwelling device, ask to have it removed   as soon as possible. Keep all follow-up visits. This is important. Contact a health care provider if: You do not feel  like you are getting better or regaining strength. You are having trouble coping with your recovery. You frequently feel tired. You feel worse or do not seem to get better after surgery. You think you may have an infection after surgery. Get help right away if: You have any symptoms of sepsis. You have difficulty breathing. You have a rapid or skipping heartbeat. You become confused or disoriented. You have a high fever. Your skin becomes blotchy, pale, or blue. You have an infection that is getting worse or not getting better. These symptoms may represent a serious problem that is an emergency. Do not wait to see if the symptoms will go away. Get medical help right away. Call your local emergency services (911 in the [place] Do not drive yourself to the hospital. Summary Sepsis is a medical emergency that requires immediate treatment in a hospital. This condition is caused by a severe reaction to infections from bacteria, viruses, or fungus. Based on the cause of your infection, you may be given an antibiotic, antiviral, or antifungal medicine. Treatment may also include IV fluids, breathing assistance, and kidney dialysis. This information is not intended to replace advice given to you by your health care provider. Make sure you discuss any questions you have with your health care provider. Document Revised: 06/30/2020 Document Reviewed: 06/30/2020 Elsevier Patient Education  2023 [employer]  

## 2022-04-26 NOTE — Progress Notes (Unsigned)
I,Tianna Badgett,acting as a Education administrator for [employer], FNP.,have documented all relevant documentation on the behalf of [name], FNP,as directed by  [name], FNP while in the presence of [name], [place].  Subjective:     Patient ID: Rebecca Day , female    DOB: Sep 02, 1985 , 36 y.o.   MRN: [id number]   Chief Complaint  Patient presents with   Hospitalization Follow-up    HPI  Patient presents today for hospitalization follow up. She was in ICU then to a transitional unit she does not remember the first few days of her hospitalization. She had a low grade fever had taken tylenol. She went back to work during that same period. During the middle of the night on Friday then on Saturday had fever. She was unable to disconnect her TPN, fever went up to 104, she called EMS and by the time she got on ambulance temp up to 105. At night blood pressure down to 90/50. She was on mididrine in the hospital. She is staying at home and focusing on her health. Her blood sugar has been up and down but her TPN was stopped cold [place]. She was on levophed during her ICU visit. She does have a home health nurse. She has a home health nurse from [facility] - she cleans bandages and checks labs once a week. She has the labs done through [facility] via [place].  She had her PICC line removed a few days after being admitted. She had a consultation by ID who recommended to remove the PICC. She had a rash with Vancomycin - she had a rash each time she had the vancomycin with itching. She was switched to Daptomycin due to the rash going down her arms She is not currently at working since her hospitalization. She continues with TPN     Past Medical History:  Diagnosis Date   Allergy    Anxiety    Asthma    Blood transfusion without reported diagnosis    as child    Clotting disorder ([place])    blood clot after last pregnancy    Elevated blood sugar    Kidney stones    Lupus ([place])    questionable, no  meds or treatment per pt   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HCC)    On total parenteral nutrition (TPN)    Ovarian cyst    PMDD (premenstrual dysphoric disorder)      Family History  Problem Relation Age of Onset   Hypertension Mother    Colon polyps Mother    Cancer Father        dx'd in 51's   Colon cancer Father    Colon cancer Maternal Aunt    Hyperlipidemia Maternal Grandmother    Kidney disease Brother    Liver disease Brother    Cancer Brother    Colon cancer Other    Esophageal cancer Neg Hx    Rectal cancer Neg Hx    Stomach cancer Neg Hx    Breast cancer Neg Hx      Current Outpatient Medications:    megestrol (MEGACE) 20 MG tablet, Take 20 mg by mouth daily., Disp: , Rfl:    sterile water SOLN with amino acids 10 % SOLN 1.3 g/kg, dextrose 70 % SOLN 20 %, Inject into the vein continuous., Disp: , Rfl:   Current Facility-Administered Medications:    dextrose 5 % solution, , Intravenous, Once, [place], MD   Allergies  Allergen Reactions  Penicillins Hives    Has patient had a PCN reaction causing immediate rash, facial/tongue/throat swelling, SOB or lightheadedness with hypotension: Yes Has patient had a PCN reaction causing severe rash involving mucus membranes or skin necrosis: No Has patient had a PCN reaction that required hospitalization: Yes Has patient had a PCN reaction occurring within the last 10 years: NO If all of the above answers are "NO", then may proceed with Cephalosporin use.     Review of Systems  Constitutional: Negative.   Respiratory: Negative.    Cardiovascular: Negative.   Gastrointestinal: Negative.   Neurological: Negative.   Psychiatric/Behavioral: Negative.       Today's Vitals   04/26/22 1011  BP: 130/68  Pulse: 80  Temp: 98 F (36.7 C)  TempSrc: Oral  Weight: 273 lb (123.8 kg)  Height: '5\' 8"'$  (1.727 m)   Body mass index is 41.51 kg/m.  Wt Readings from Last 3 Encounters:  04/26/22 273 lb (123.8 kg)  04/15/22 270 lb 8.1  oz (122.7 kg)  04/02/22 274 lb 6.4 oz (124.5 kg)    Objective:  Physical Exam Vitals reviewed.  Constitutional:      General: She is not in acute distress.    Appearance: Normal appearance. She is obese.  Cardiovascular:     Rate and Rhythm: Normal rate and regular rhythm.     Pulses: Normal pulses.     Heart sounds: Normal heart sounds. No murmur heard. Pulmonary:     Effort: Pulmonary effort is normal. No respiratory distress.     Breath sounds: Normal breath sounds. No wheezing.  Skin:    General: Skin is warm and dry.     Capillary Refill: Capillary refill takes less than 2 seconds.     Comments: Has PICC to right upper arm.   Neurological:     General: No focal deficit present.     Mental Status: She is alert and oriented to person, place, and time.     Cranial Nerves: No cranial nerve deficit.     Motor: No weakness.  Psychiatric:        Mood and Affect: Mood normal.        Behavior: Behavior normal.        Thought Content: Thought content normal.        Judgment: Judgment normal.         Assessment And Plan:     1. Septic shock (HCC)  2. Class 3 severe obesity due to excess calories without serious comorbidity with body mass index (BMI) of 40.0 to 44.9 in adult ([place])  3. History of gastric bypass  4. On total parenteral nutrition (TPN) Comments: Currently weaning from TPN over the next 2 weeks. Will send message via mychart.  She is encouraged to strive for BMI less than 30 to decrease cardiac risk. Advised to aim for at least 150 minutes of exercise per week.    Patient was given opportunity to ask questions. Patient verbalized understanding of the plan and was able to repeat key elements of the plan. All questions were answered to their satisfaction.  [name], FNP   I, [name], FNP, have reviewed all documentation for this visit. The documentation on 04/26/22 for the exam, diagnosis, procedures, and orders are all accurate and complete.   IF YOU  HAVE BEEN REFERRED TO A SPECIALIST, IT MAY TAKE 1-2 WEEKS TO SCHEDULE/PROCESS THE REFERRAL. IF YOU HAVE NOT HEARD FROM US/SPECIALIST IN TWO WEEKS, PLEASE GIVE [place] A CALL AT [phone]  X [phone].   THE PATIENT IS ENCOURAGED TO PRACTICE SOCIAL DISTANCING DUE TO THE COVID-19 PANDEMIC.

## 2022-04-27 ENCOUNTER — Ambulatory Visit: Payer: No Typology Code available for payment source

## 2022-05-05 ENCOUNTER — Ambulatory Visit
Admission: RE | Admit: 2022-05-05 | Discharge: 2022-05-05 | Disposition: A | Discharge status: Home / Self Care | Payer: No Typology Code available for payment source | Source: Ambulatory Visit | Attending: Nurse Practitioner | Admitting: Nurse Practitioner

## 2022-05-05 ENCOUNTER — Encounter: Payer: Self-pay | Admitting: Nurse Practitioner

## 2022-05-05 DIAGNOSIS — J189 Pneumonia, unspecified organism: Secondary | ICD-10-CM

## 2022-05-10 ENCOUNTER — Encounter: Payer: Self-pay | Admitting: Nurse Practitioner

## 2022-05-27 ENCOUNTER — Other Ambulatory Visit (HOSPITAL_COMMUNITY)
Admission: RE | Admit: 2022-05-27 | Discharge: 2022-05-27 | Disposition: A | Discharge status: Home / Self Care | Payer: No Typology Code available for payment source | Source: Ambulatory Visit | Attending: Family Medicine | Admitting: Family Medicine

## 2022-05-27 ENCOUNTER — Ambulatory Visit (INDEPENDENT_AMBULATORY_CARE_PROVIDER_SITE_OTHER): Payer: No Typology Code available for payment source | Admitting: Family Medicine

## 2022-05-27 ENCOUNTER — Encounter: Payer: Self-pay | Admitting: Family Medicine

## 2022-05-27 VITALS — BP 113/71 | HR 74 | Ht 69.0 in | Wt 268.0 lb

## 2022-05-27 DIAGNOSIS — N898 Other specified noninflammatory disorders of vagina: Secondary | ICD-10-CM | POA: Insufficient documentation

## 2022-05-27 NOTE — Progress Notes (Signed)
   Subjective:    Patient ID: Rebecca Day, female    DOB: 04-22-1986, 36 y.o.   MRN: [id number]  HPI  Patient is status post gastric sleeve in 01/2022.  She had difficult time eating following the surgery and ended up with a PICC line for several months.  She was admitted to the hospital in August due to sepsis and was admitted for 2 weeks for that.  She received broad-spectrum antibiotics.  She was discharged with home IV antibiotics, which she has finished.  She has noted increased vaginal discharge and vaginal irritation.  Review of Systems     Objective:   Physical Exam Vitals reviewed. Exam conducted with a chaperone present.  Constitutional:      Appearance: Normal appearance.  Genitourinary:    Labia:        Right: No rash or tenderness.        Left: No rash or tenderness.      Comments: Gray vaginal discharge. Neurological:     General: No focal deficit present.     Mental Status: She is alert.  Psychiatric:        Mood and Affect: Mood normal.        Behavior: Behavior normal.        Thought Content: Thought content normal.       Assessment & Plan:  1. Vaginal discharge Will get vaginal culture and treat accordingly. Recommended probiotics. - Cervicovaginal ancillary only( [place])

## 2022-05-27 NOTE — Progress Notes (Signed)
Patient takes Megace to prevent bleeding. Recent gastric sleeve and complications that put her back in the hospital. Patient states she has had some vaginal odor/ burning.  Kathrene Alu RN

## 2022-06-01 LAB — CERVICOVAGINAL ANCILLARY ONLY
Bacterial Vaginitis (gardnerella): POSITIVE — AB
Candida Glabrata: NEGATIVE
Candida Vaginitis: NEGATIVE
Chlamydia: NEGATIVE
Comment: NEGATIVE
Comment: NEGATIVE
Comment: NEGATIVE
Comment: NEGATIVE
Comment: NEGATIVE
Comment: NORMAL
Neisseria Gonorrhea: NEGATIVE
Trichomonas: NEGATIVE

## 2022-06-02 ENCOUNTER — Other Ambulatory Visit: Payer: Self-pay

## 2022-06-02 DIAGNOSIS — B9689 Other specified bacterial agents as the cause of diseases classified elsewhere: Secondary | ICD-10-CM

## 2022-06-02 MED ORDER — METRONIDAZOLE 500 MG PO TABS: 500.0000 mg | ORAL_TABLET | Freq: Two times a day (BID) | ORAL | 0 refills | Status: DC

## 2022-06-02 NOTE — Progress Notes (Signed)
Patient made aware via Mychart that she tested positive for BV and Flagyl 500 mg BID x 7 days was sent to her pharmacy. Chavis Tessler l [place] Janus, CMA

## 2022-07-17 ENCOUNTER — Other Ambulatory Visit: Payer: Self-pay | Admitting: Nurse Practitioner

## 2022-08-16 ENCOUNTER — Other Ambulatory Visit: Payer: Self-pay | Admitting: Family Medicine

## 2022-08-19 ENCOUNTER — Other Ambulatory Visit: Payer: Self-pay

## 2022-08-19 MED ORDER — MEGESTROL ACETATE 40 MG PO TABS: 80.0000 mg | ORAL_TABLET | Freq: Two times a day (BID) | ORAL | 3 refills | Status: DC

## 2022-09-06 ENCOUNTER — Other Ambulatory Visit: Payer: Self-pay

## 2022-09-06 ENCOUNTER — Encounter (HOSPITAL_BASED_OUTPATIENT_CLINIC_OR_DEPARTMENT_OTHER): Payer: Self-pay | Admitting: Urology

## 2022-09-06 DIAGNOSIS — R109 Unspecified abdominal pain: Secondary | ICD-10-CM | POA: Diagnosis not present

## 2022-09-06 DIAGNOSIS — J45909 Unspecified asthma, uncomplicated: Secondary | ICD-10-CM | POA: Insufficient documentation

## 2022-09-06 DIAGNOSIS — Z3A01 Less than 8 weeks gestation of pregnancy: Secondary | ICD-10-CM | POA: Insufficient documentation

## 2022-09-06 DIAGNOSIS — O26891 Other specified pregnancy related conditions, first trimester: Secondary | ICD-10-CM | POA: Diagnosis not present

## 2022-09-06 LAB — URINALYSIS, ROUTINE W REFLEX MICROSCOPIC
Glucose, UA: NEGATIVE mg/dL
Ketones, ur: NEGATIVE mg/dL
Leukocytes,Ua: NEGATIVE
Nitrite: NEGATIVE
Protein, ur: 30 mg/dL — AB
Specific Gravity, Urine: >=1.03 (ref 1.005–1.030)
pH: 5.5 (ref 5.0–8.0)

## 2022-09-06 LAB — PREGNANCY, URINE: Preg Test, Ur: POSITIVE — AB

## 2022-09-06 LAB — URINALYSIS, MICROSCOPIC (REFLEX): RBC / HPF: >50 RBC/hpf (ref 0–5)

## 2022-09-06 NOTE — ED Triage Notes (Signed)
Left sided flank pain with nausea x 3 days   H/o kidney stones

## 2022-09-07 ENCOUNTER — Emergency Department (HOSPITAL_BASED_OUTPATIENT_CLINIC_OR_DEPARTMENT_OTHER): Payer: No Typology Code available for payment source

## 2022-09-07 ENCOUNTER — Emergency Department (HOSPITAL_BASED_OUTPATIENT_CLINIC_OR_DEPARTMENT_OTHER)
Admission: EM | Admit: 2022-09-07 | Discharge: 2022-09-07 | Disposition: A | Discharge status: Home / Self Care | Payer: No Typology Code available for payment source | Attending: Emergency Medicine | Admitting: Emergency Medicine

## 2022-09-07 ENCOUNTER — Encounter (HOSPITAL_BASED_OUTPATIENT_CLINIC_OR_DEPARTMENT_OTHER): Payer: Self-pay | Admitting: Radiology

## 2022-09-07 DIAGNOSIS — O26891 Other specified pregnancy related conditions, first trimester: Secondary | ICD-10-CM | POA: Diagnosis not present

## 2022-09-07 DIAGNOSIS — R109 Unspecified abdominal pain: Secondary | ICD-10-CM

## 2022-09-07 DIAGNOSIS — Z3201 Encounter for pregnancy test, result positive: Secondary | ICD-10-CM

## 2022-09-07 LAB — HCG, QUANTITATIVE, PREGNANCY: hCG, Beta Chain, Quant, S: 37 m[IU]/mL — ABNORMAL HIGH (ref <5)

## 2022-09-07 MED ORDER — SODIUM CHLORIDE 0.9 % IV BOLUS
1000.0000 mL | Freq: Once | INTRAVENOUS | Status: AC
  Administered 2022-09-07: 1000 mL via INTRAVENOUS

## 2022-09-07 MED ORDER — ACETAMINOPHEN 500 MG PO TABS
1000.0000 mg | ORAL_TABLET | Freq: Once | ORAL | Status: AC
  Administered 2022-09-07: 1000 mg via ORAL
  Filled 2022-09-07: qty 2

## 2022-09-07 NOTE — ED Provider Notes (Signed)
[facility] DE[facility] Provider Note: Georgena Spurling, MD, FACEP  CSN: [id number] MRN: [id number] ARRIVAL: 09/06/22 at [place]  Flank Pain   HISTORY OF PRESENT ILLNESS  09/07/22 1:57 AM Rebecca Day is a 37 y.o. female with a history of kidney stones.  She is here with 3 days of left flank pain associated with nausea and loss of appetite.  Her last menstrual period was 07/24/2022 and she is not sure if she is currently pregnant.  She rates her pain as a 6 out of 10 but would like only Tylenol at the present time.  She has been taking Tylenol and ibuprofen for her pain at home.   Past Medical History:  Diagnosis Date   Allergy    Anxiety    Asthma    Blood transfusion without reported diagnosis    as child    Clotting disorder ([place])    blood clot after last pregnancy    Elevated blood sugar    Kidney stones    Lupus ([place])    questionable, no meds or treatment per pt   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HCC)    On total parenteral nutrition (TPN)    Ovarian cyst    PMDD (premenstrual dysphoric disorder)     Past Surgical History:  Procedure Laterality Date   CESAREAN SECTION     C/S x 2   LAPAROSCOPIC GASTRIC SLEEVE RESECTION N/A 01/28/2022   PILONIDAL CYST DRAINAGE      Family History  Problem Relation Age of Onset   Hypertension Mother    Colon polyps Mother    Cancer Father        dx'd in 12's   Colon cancer Father    Colon cancer Maternal Aunt    Hyperlipidemia Maternal Grandmother    Kidney disease Brother    Liver disease Brother    Cancer Brother    Colon cancer Other    Esophageal cancer Neg Hx    Rectal cancer Neg Hx    Stomach cancer Neg Hx    Breast cancer Neg Hx     Social History   Tobacco Use   Smoking status: Never   Smokeless tobacco: Never  Vaping Use   Vaping Use: Never used  Substance Use Topics   Alcohol use: Yes    Comment: socially   Drug use: No    Prior to Admission medications   Medication Sig Start  Date End Date Taking? Authorizing Provider  megestrol (MEGACE) 20 MG tablet Take 20 mg by mouth daily.    [provider]  megestrol (MEGACE) 40 MG tablet Take 2 tablets (80 mg total) by mouth 2 (two) times daily. 08/19/22   Truett Mainland, DO  sterile water SOLN with amino acids 10 % SOLN 1.3 g/kg, dextrose 70 % SOLN 20 % Inject into the vein continuous.    [provider]    Allergies Penicillins and Vancomycin   REVIEW OF SYSTEMS  Negative except as noted here or in the History of Present Illness.   PHYSICAL EXAMINATION  Initial Vital Signs Blood pressure 130/83, pulse 91, temperature 98.2 F (36.8 C), temperature source Oral, resp. rate 20, height '5\' 9"'$  (1.753 m), last menstrual period 07/24/2022, SpO2 100 %.  Examination General: Well-developed, well-nourished female in no acute distress; appearance consistent with age of record HENT: normocephalic; atraumatic Eyes: normal appearance Neck: supple Heart: regular rate and rhythm Lungs: clear to auscultation bilaterally Abdomen: soft; nondistended; nontender; bowel sounds present  GU: Mild left CVA tenderness Extremities: No deformity; full range of motion; pulses normal Neurologic: Awake, alert and oriented; motor function intact in all extremities and symmetric; no facial droop Skin: Warm and dry Psychiatric: Normal mood and affect   RESULTS  Summary of this visit's results, reviewed and interpreted by myself:   EKG Interpretation  Date/Time:    Ventricular Rate:    PR Interval:    QRS Duration:   QT Interval:    QTC Calculation:   R Axis:     Text Interpretation:         Laboratory Studies: Results for orders placed or performed during the hospital encounter of 09/07/22 (from the past 24 hour(s))  Urinalysis, Routine w reflex microscopic Urine, Clean Catch     Status: Abnormal   Collection Time: 09/06/22 10:42 PM  Result Value Ref Range   Color, Urine AMBER (A) YELLOW   APPearance HAZY  (A) CLEAR   Specific Gravity, Urine >=1.030 1.005 - 1.030   pH 5.5 5.0 - 8.0   Glucose, UA NEGATIVE NEGATIVE mg/dL   Hgb urine dipstick LARGE (A) NEGATIVE   Bilirubin Urine SMALL (A) NEGATIVE   Ketones, ur NEGATIVE NEGATIVE mg/dL   Protein, ur 30 (A) NEGATIVE mg/dL   Nitrite NEGATIVE NEGATIVE   Leukocytes,Ua NEGATIVE NEGATIVE  Pregnancy, urine     Status: Abnormal   Collection Time: 09/06/22 10:42 PM  Result Value Ref Range   Preg Test, Ur POSITIVE (A) NEGATIVE  Urinalysis, Microscopic (reflex)     Status: Abnormal   Collection Time: 09/06/22 10:42 PM  Result Value Ref Range   RBC / HPF >50 0 - 5 RBC/hpf   WBC, UA 0-5 0 - 5 WBC/hpf   Bacteria, UA RARE (A) NONE SEEN   Squamous Epithelial / HPF 0-5 0 - 5 /HPF   Ca Oxalate Crys, UA PRESENT   hCG, quantitative, pregnancy     Status: Abnormal   Collection Time: 09/07/22  2:59 AM  Result Value Ref Range   hCG, Beta Chain, Quant, S 37 (H) <5 mIU/mL   Imaging Studies: US OB LESS THAN 14 WEEKS WITH OB TRANSVAGINAL  Result Date: 09/07/2022 CLINICAL DATA:  Left flank pain and hematuria. EXAM: OBSTETRIC <14 WK [place] AND TRANSVAGINAL OB US TECHNIQUE: Both transabdominal and transvaginal ultrasound examinations were performed for complete evaluation of the gestation as well as the maternal uterus, adnexal regions, and pelvic cul-de-sac. Transvaginal technique was performed to assess early pregnancy. COMPARISON:  None Available. FINDINGS: Intrauterine gestational sac: None Yolk sac:  Not Visualized. Embryo:  Not Visualized. Cardiac Activity: Not Visualized. Heart Rate: N/A  bpm Subchorionic hemorrhage:  None visualized. Maternal uterus/adnexae: The endometrium measures 1.9 cm in thickness and is heterogeneous in appearance. The right and left ovaries are limited in visualization secondary to overlying bowel gas. A trace amount of pelvic free fluid is noted. IMPRESSION: Thick, heterogeneous endometrium without evidence of an intrauterine pregnancy.  Electronically Signed   By: Virgina Norfolk M.D.   On: 09/07/2022 02:48   US Renal  Result Date: 09/07/2022 CLINICAL DATA:  Left flank pain and hematuria. EXAM: RENAL / URINARY TRACT ULTRASOUND COMPLETE COMPARISON:  None Available. FINDINGS: Right Kidney: Renal measurements: 10.9 cm x 5.0 cm x 5.3 cm = volume: 150 mL. Echogenicity within normal limits. No mass or hydronephrosis visualized. Left Kidney: Renal measurements: 11.3 cm x 6.1 cm x 6.0 cm = volume: 216.3 mL. Echogenicity within normal limits. No mass or hydronephrosis visualized. Bladder: Poorly distended and subsequently  limited in evaluation. Other: None. IMPRESSION: Normal renal ultrasound. Electronically Signed   By: Virgina Norfolk M.D.   On: 09/07/2022 02:47    ED COURSE and MDM  Nursing notes, initial and subsequent vitals signs, including pulse oximetry, reviewed and interpreted by myself.  Vitals:   09/06/22 2238 09/06/22 2241 09/07/22 0321 09/07/22 0330  BP:  130/83  117/66  Pulse:  91  60  Resp:  20  16  Temp:  98.2 F (36.8 C) 97.9 F (36.6 C)   TempSrc:  Oral Oral   SpO2:  100%  100%  Height: '5\' 9"'$  (1.753 m)      Medications  acetaminophen (TYLENOL) tablet 1,000 mg (1,000 mg Oral Given 09/07/22 0236)  sodium chloride 0.9 % bolus 1,000 mL (1,000 mLs Intravenous New Bag/Given 09/07/22 0318)   3:49 AM Patient's pain is significantly improved.  There is no evidence of hydronephrosis, hydroureter or stone shadow on ultrasound.  I suspect she had a stone and passed it given the symptoms and hematuria.  The patient was advised of her low beta-hCG.  This is either consistent with a very very early pregnancy or loss of pregnancy.  She does have an OB/GYN that she can follow-up with.  She was advised to follow-up in a few days and have her beta-hCG rechecked to see if is going up or down.   PROCEDURES  Procedures   ED DIAGNOSES     ICD-10-CM   1. Left flank pain  R10.9     2. Positive pregnancy test  Z32.01           Tiarrah Saville, Jenny Reichmann, MD 09/07/22 [phone]

## 2022-09-07 NOTE — Discharge Instructions (Signed)
Contact your OB/GYN to arrange for a follow-up beta-hCG test.  Your beta hCG in the ED was 37 which indicates a very early pregnancy or the loss of a pregnancy.  No pregnancy was seen on ultrasound.

## 2022-09-08 ENCOUNTER — Telehealth: Payer: Self-pay

## 2022-09-08 NOTE — Telephone Encounter (Signed)
Transition Care Management Unsuccessful Follow-up Telephone Call  Date of discharge and from where:  09/07/2021 [place]   Attempts:  1st Attempt  Reason for unsuccessful TCM follow-up call:  Left voice message

## 2022-09-09 ENCOUNTER — Telehealth: Payer: Self-pay

## 2022-09-09 NOTE — Telephone Encounter (Signed)
Transition Care Management Unsuccessful Follow-up Telephone Call  Date of discharge and from where:  09/07/2021 [facility]   Attempts:  2nd Attempt  Reason for unsuccessful TCM follow-up call: unable to leave vm

## 2022-10-05 ENCOUNTER — Other Ambulatory Visit: Payer: Self-pay | Admitting: Family Medicine

## 2022-10-05 DIAGNOSIS — N6310 Unspecified lump in the right breast, unspecified quadrant: Secondary | ICD-10-CM

## 2022-10-12 DIAGNOSIS — D6862 Lupus anticoagulant syndrome: Secondary | ICD-10-CM | POA: Insufficient documentation

## 2022-10-12 DIAGNOSIS — K589 Irritable bowel syndrome without diarrhea: Secondary | ICD-10-CM | POA: Insufficient documentation

## 2022-10-14 ENCOUNTER — Encounter: Payer: No Typology Code available for payment source | Admitting: Nurse Practitioner

## 2022-10-21 ENCOUNTER — Encounter: Payer: Self-pay | Admitting: Family Medicine

## 2022-10-21 ENCOUNTER — Encounter: Payer: No Typology Code available for payment source | Admitting: Family Medicine

## 2022-11-22 ENCOUNTER — Other Ambulatory Visit: Payer: Self-pay | Admitting: Family Medicine

## 2022-11-22 ENCOUNTER — Ambulatory Visit
Admission: RE | Admit: 2022-11-22 | Discharge: 2022-11-22 | Disposition: A | Discharge status: Home / Self Care | Payer: No Typology Code available for payment source | Source: Ambulatory Visit | Attending: Family Medicine | Admitting: Family Medicine

## 2022-11-22 DIAGNOSIS — N6310 Unspecified lump in the right breast, unspecified quadrant: Secondary | ICD-10-CM

## 2022-12-15 ENCOUNTER — Emergency Department (HOSPITAL_BASED_OUTPATIENT_CLINIC_OR_DEPARTMENT_OTHER)
Admission: EM | Admit: 2022-12-15 | Discharge: 2022-12-16 | Disposition: A | Discharge status: Home / Self Care | Payer: No Typology Code available for payment source | Attending: Emergency Medicine | Admitting: Emergency Medicine

## 2022-12-15 ENCOUNTER — Encounter (HOSPITAL_BASED_OUTPATIENT_CLINIC_OR_DEPARTMENT_OTHER): Payer: Self-pay | Admitting: Pediatrics

## 2022-12-15 ENCOUNTER — Emergency Department (HOSPITAL_BASED_OUTPATIENT_CLINIC_OR_DEPARTMENT_OTHER): Payer: No Typology Code available for payment source

## 2022-12-15 ENCOUNTER — Other Ambulatory Visit: Payer: Self-pay

## 2022-12-15 DIAGNOSIS — R1010 Upper abdominal pain, unspecified: Secondary | ICD-10-CM | POA: Diagnosis present

## 2022-12-15 DIAGNOSIS — R1084 Generalized abdominal pain: Secondary | ICD-10-CM | POA: Diagnosis not present

## 2022-12-15 LAB — COMPREHENSIVE METABOLIC PANEL
ALT: 23 U/L (ref 0–44)
AST: 25 U/L (ref 15–41)
Albumin: 4 g/dL (ref 3.5–5.0)
Alkaline Phosphatase: 101 U/L (ref 38–126)
Anion gap: 8 (ref 5–15)
BUN: 7 mg/dL (ref 6–20)
CO2: 24 mmol/L (ref 22–32)
Calcium: 8.6 mg/dL — ABNORMAL LOW (ref 8.9–10.3)
Chloride: 106 mmol/L (ref 98–111)
Creatinine, Ser: 0.71 mg/dL (ref 0.44–1.00)
GFR, Estimated: >60 mL/min (ref >60)
Glucose, Bld: 84 mg/dL (ref 70–99)
Potassium: 3.6 mmol/L (ref 3.5–5.1)
Sodium: 138 mmol/L (ref 135–145)
Total Bilirubin: 0.4 mg/dL (ref 0.3–1.2)
Total Protein: 7.3 g/dL (ref 6.5–8.1)

## 2022-12-15 LAB — URINALYSIS, MICROSCOPIC (REFLEX): WBC, UA: NONE SEEN WBC/hpf (ref 0–5)

## 2022-12-15 LAB — CBC
HCT: 37.5 % (ref 36.0–46.0)
Hemoglobin: 12.5 g/dL (ref 12.0–15.0)
MCH: 29.6 pg (ref 26.0–34.0)
MCHC: 33.3 g/dL (ref 30.0–36.0)
MCV: 88.7 fL (ref 80.0–100.0)
Platelets: 286 10*3/uL (ref 150–400)
RBC: 4.23 MIL/uL (ref 3.87–5.11)
RDW: 12.7 % (ref 11.5–15.5)
WBC: 5.7 10*3/uL (ref 4.0–10.5)
nRBC: 0 % (ref 0.0–0.2)

## 2022-12-15 LAB — URINALYSIS, ROUTINE W REFLEX MICROSCOPIC
Bilirubin Urine: NEGATIVE
Glucose, UA: NEGATIVE mg/dL
Ketones, ur: NEGATIVE mg/dL
Leukocytes,Ua: NEGATIVE
Nitrite: NEGATIVE
Protein, ur: NEGATIVE mg/dL
Specific Gravity, Urine: 1.025 (ref 1.005–1.030)
pH: 6 (ref 5.0–8.0)

## 2022-12-15 LAB — LIPASE, BLOOD: Lipase: 26 U/L (ref 11–51)

## 2022-12-15 LAB — PREGNANCY, URINE: Preg Test, Ur: NEGATIVE

## 2022-12-15 MED ORDER — ONDANSETRON HCL 4 MG/2ML IJ SOLN
4.0000 mg | Freq: Once | INTRAMUSCULAR | Status: AC | PRN
  Administered 2022-12-15: 4 mg via INTRAVENOUS
  Filled 2022-12-15: qty 2

## 2022-12-15 MED ORDER — ONDANSETRON HCL 4 MG PO TABS: 4.0000 mg | ORAL_TABLET | Freq: Three times a day (TID) | ORAL | 0 refills | Status: DC | PRN

## 2022-12-15 MED ORDER — ALUM & MAG HYDROXIDE-SIMETH 200-200-20 MG/5ML PO SUSP
30.0000 mL | Freq: Once | ORAL | Status: AC
  Administered 2022-12-15: 30 mL via ORAL
  Filled 2022-12-15: qty 30

## 2022-12-15 MED ORDER — IOHEXOL 300 MG/ML  SOLN
125.0000 mL | Freq: Once | INTRAMUSCULAR | Status: AC | PRN
  Administered 2022-12-15: 125 mL via INTRAVENOUS

## 2022-12-15 MED ORDER — FAMOTIDINE IN NACL 20-0.9 MG/50ML-% IV SOLN
20.0000 mg | Freq: Once | INTRAVENOUS | Status: AC
  Administered 2022-12-15: 20 mg via INTRAVENOUS
  Filled 2022-12-15: qty 50

## 2022-12-15 MED ORDER — OXYCODONE-ACETAMINOPHEN 5-325 MG PO TABS
1.0000 | ORAL_TABLET | Freq: Once | ORAL | Status: AC
  Administered 2022-12-15: 1 via ORAL
  Filled 2022-12-15: qty 1

## 2022-12-15 MED ORDER — SODIUM CHLORIDE 0.9 % IV BOLUS
1000.0000 mL | Freq: Once | INTRAVENOUS | Status: AC
  Administered 2022-12-15: 1000 mL via INTRAVENOUS

## 2022-12-15 NOTE — ED Notes (Signed)
Patient transported to CT 

## 2022-12-15 NOTE — Discharge Instructions (Addendum)
It was a pleasure take care of you today!  There were no concerning emergent findings on your lab or imaging studies today.  Call your primary care provider and set up a follow-up appointment regarding today's ED visit.  You may also follow-up with your surgeon regarding today's ED visit.  You be sent a prescription for Zofran, take as directed if you are experiencing nausea or vomiting.  If you take the Zofran, wait least 30 minutes before you attempt small sips/small bites of food/fluid.  Ensure to maintain fluid intake with water, tea, broth, Gatorade, Pedialyte.  Return to the emergency department if you experience increasing/worsening symptoms.

## 2022-12-15 NOTE — ED Triage Notes (Signed)
C/O abdominal pain, sharp in nature worst on palpation. Ongoing for the last few days, worst with eating, reports hx gastric sleeve bypass sx 01/2022;

## 2022-12-15 NOTE — ED Provider Notes (Signed)
[place] [facility] AT [facility] Provider Note   CSN: [id number] Arrival date & time: 12/15/22  1830     History  Chief Complaint  Patient presents with   Abdominal Pain    Rebecca Day is a 37 y.o. female who presents emergency department concerns for upper abdominal pain onset prior to arrival.  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  Has associated nausea.  No meds tried at home.  No history of GERD.  Still has her gallbladder and appendix.  Denies fever, urinary symptoms, vaginal bleeding, vaginal discharge.   The history is provided by the patient. No language interpreter was used.       Home Medications Prior to Admission medications   Medication Sig Start Date End Date Taking? Authorizing Provider  ondansetron (ZOFRAN) 4 MG tablet Take 1 tablet (4 mg total) by mouth every 8 (eight) hours as needed for nausea or vomiting. 12/15/22  Yes Virgil Lightner A, PA-C  megestrol (MEGACE) 40 MG tablet Take 2 tablets (80 mg total) by mouth 2 (two) times daily. 08/19/22   Levie Heritage, DO  metoCLOPramide (REGLAN) 10 MG tablet Take 10 mg by mouth 3 (three) times daily before meals.    [provider]  norethindrone (MICRONOR) 0.35 MG tablet Take 1 tablet by mouth daily. 08/31/22   [provider]  valACYclovir (VALTREX) 500 MG tablet Take 500 mg by mouth daily. 08/12/22   [provider]      Allergies    Penicillins and Vancomycin    Review of Systems   Review of Systems  Gastrointestinal:  Positive for abdominal pain.  All other systems reviewed and are negative.   Physical Exam Updated Vital Signs BP 127/78 (BP Location: Left Arm)   Pulse 72   Temp 98.3 F (36.8 C)   Resp 18   Ht  (1.727 m)   Wt 103.9 kg   LMP  (LMP Unknown)   SpO2 100%   Breastfeeding Unknown   BMI 34.82 kg/m  Physical Exam Vitals and nursing note reviewed.  Constitutional:      General: She is not in acute distress.     Appearance: She is not diaphoretic.  HENT:     Head: Normocephalic and atraumatic.     Mouth/Throat:     Pharynx: No oropharyngeal exudate.  Eyes:     General: No scleral icterus.    Conjunctiva/sclera: Conjunctivae normal.  Cardiovascular:     Rate and Rhythm: Normal rate and regular rhythm.     Pulses: Normal pulses.     Heart sounds: Normal heart sounds.  Pulmonary:     Effort: Pulmonary effort is normal. No respiratory distress.     Breath sounds: Normal breath sounds. No wheezing.  Abdominal:     General: Bowel sounds are normal.     Palpations: Abdomen is soft. There is no mass.     Tenderness: There is generalized abdominal tenderness. There is no guarding or rebound.     Comments: Tenderness to palpation noted diffusely throughout abdomen.  Musculoskeletal:        General: Normal range of motion.     Cervical back: Normal range of motion and neck supple.  Skin:    General: Skin is warm and dry.  Neurological:     Mental Status: She is alert.  Psychiatric:        Behavior: Behavior normal.     ED Results / Procedures / Treatments  Labs (all labs ordered are listed, but only abnormal results are displayed) Labs Reviewed  COMPREHENSIVE METABOLIC PANEL - Abnormal; Notable for the following components:      Result Value   Calcium 8.6 (*)    All other components within normal limits  URINALYSIS, ROUTINE W REFLEX MICROSCOPIC - Abnormal; Notable for the following components:   Hgb urine dipstick LARGE (*)    All other components within normal limits  URINALYSIS, MICROSCOPIC (REFLEX) - Abnormal; Notable for the following components:   Bacteria, UA RARE (*)    All other components within normal limits  LIPASE, BLOOD  CBC  PREGNANCY, URINE    EKG None  Radiology CT ABDOMEN PELVIS W CONTRAST  Result Date: 12/15/2022 CLINICAL DATA:  Upper abdominal pain EXAM: CT ABDOMEN AND PELVIS WITH CONTRAST TECHNIQUE: Multidetector CT imaging of the abdomen and pelvis was  performed using the standard protocol following bolus administration of intravenous contrast. RADIATION DOSE REDUCTION: This exam was performed according to the departmental dose-optimization program which includes automated exposure control, adjustment of the mA and/or kV according to patient size and/or use of iterative reconstruction technique. CONTRAST:  OMNIPAQUE IOHEXOL 300 MG/ML  SOLN COMPARISON:  04/04/2022 FINDINGS: Lower chest: Lung bases are clear. Hepatobiliary: Liver is within normal limits. Gallbladder is unremarkable. No intrahepatic or extrahepatic ductal dilatation. Pancreas: Within normal limits. Spleen: Within normal limits. Adrenals/Urinary Tract: Adrenal glands are within normal limits. Punctate nonobstructing left upper pole renal calculus (series 2/image 25). Punctate nonobstructing left lower pole renal calculus (coronal image 82). Right kidney is within normal limits. No hydronephrosis. Bladder is underdistended but unremarkable. Stomach/Bowel: Postsurgical changes related to gastric bypass with patent gastrojejunostomy. No evidence of bowel obstruction. Normal appendix (series 2/image 87). No colonic wall thickening or inflammatory changes. Vascular/Lymphatic: No evidence of abdominal aortic aneurysm. No suspicious abdominopelvic lymphadenopathy. Reproductive: Uterus is within normal limits. Bilateral ovaries are within normal limits. Other: No abdominopelvic ascites. Musculoskeletal: Visualized osseous structures are within normal limits. IMPRESSION: Status post gastric bypass.  No evidence of bowel obstruction. Punctate nonobstructing left renal calculi. No hydronephrosis. Electronically Signed   By: Charline Bills M.D.   On: 12/15/2022 22:22    Procedures Procedures    Medications Ordered in ED Medications  ondansetron (ZOFRAN) injection 4 mg (4 mg Intravenous Given 12/15/22 1911)  famotidine (PEPCID) IVPB 20 mg premix (0 mg Intravenous Stopped 12/15/22 2311)  sodium  chloride 0.9 % bolus 1,000 mL (0 mLs Intravenous Stopped 12/15/22 2329)  alum & mag hydroxide-simeth (MAALOX/MYLANTA) 200-200-20 MG/5ML suspension 30 mL (30 mLs Oral Given 12/15/22 2218)  iohexol (OMNIPAQUE) 300 MG/ML solution 125 mL (125 mLs Intravenous Contrast Given 12/15/22 2203)  oxyCODONE-acetaminophen (PERCOCET/ROXICET) 5-325 MG per tablet 1 tablet (1 tablet Oral Given 12/15/22 2316)    ED Course/ Medical Decision Making/ A&P Clinical Course as of 12/15/22 2350  Wed Dec 15, 2022  2309 Pt re-evaluated and discussed with patient lab and imaging findings. [SB]  2342 Re-evaluated and noted improvement of symptoms with treatment regimen. Discussed discharge treatment plan. Pt agreeable at this time. Pt appears safe for discharge. [SB]    Clinical Course User Index [SB] Tayjah Lobdell A, PA-C                             Medical Decision Making Amount and/or Complexity of Data Reviewed Labs: ordered. Radiology: ordered.  Risk OTC drugs. Prescription drug management.   Patient presents to the emergency department with concerns  for abdominal pain onset today.  Notes that she may have ate something that is not typical of the diet after a gastric sleeve that could be the cause of her symptoms.  Patient afebrile.  On exam patient with diffuse abdominal tenderness to palpation.  differential diagnosis includes pancreatitis, cholecystitis, appendicitis, diverticulitis, acute cystitis.  Labs:  I ordered, and personally interpreted labs.  The pertinent results include:  Lipase, CMP, CBC, urinalysis unremarkable Negative pregnancy urine  Imaging: I ordered imaging studies including CT abdomen pelvis with I independently visualized and interpreted imaging which showed  Status post gastric bypass.  No evidence of bowel obstruction.    Punctate nonobstructing left renal calculi. No hydronephrosis.   I agree with the radiologist interpretation  Medications:  I ordered medication including IV  fluids, Pepcid, GI cocktail, Zofran, Percocet for symptom management.  Reevaluation of the patient after these medicines and interventions, I reevaluated the patient and found that they have improved I have reviewed the patients home medicines and have made adjustments as needed   Disposition: Presentation suspicious for diffuse abdominal pain.  Doubt concerns at this time for pancreatitis, cholecystitis, appendicitis, diverticulitis, acute cystitis. After consideration of the diagnostic results and the patients response to treatment, I feel that the patient would benefit from Discharge home.  Patient discharged home with a prescription for Zofran.  Instructed patient to follow-up with her care team regarding today's ED visit.  Work note provided. Supportive care measures and strict return precautions discussed with patient at bedside. Pt acknowledges and verbalizes understanding. Pt appears safe for discharge. Follow up as indicated in discharge paperwork.   This chart was dictated using voice recognition software, Dragon. Despite the best efforts of this provider to proofread and correct errors, errors may still occur which can change documentation meaning.   Final Clinical Impression(s) / ED Diagnoses Final diagnoses:  Upper abdominal pain    Rx / DC Orders ED Discharge Orders          Ordered    ondansetron (ZOFRAN) 4 MG tablet  Every 8 hours PRN        12/15/22 2345              Saniya Tranchina A, PA-C 12/15/22 2351    Glyn Ade, MD 12/17/22 1501

## 2022-12-28 ENCOUNTER — Encounter: Payer: Self-pay | Admitting: Nurse Practitioner

## 2022-12-28 ENCOUNTER — Ambulatory Visit (INDEPENDENT_AMBULATORY_CARE_PROVIDER_SITE_OTHER): Payer: No Typology Code available for payment source | Admitting: Nurse Practitioner

## 2022-12-28 VITALS — BP 112/78 | HR 76 | Temp 98.1°F | Ht 65.8 in | Wt 221.8 lb

## 2022-12-28 DIAGNOSIS — F339 Major depressive disorder, recurrent, unspecified: Secondary | ICD-10-CM

## 2022-12-28 DIAGNOSIS — E6609 Other obesity due to excess calories: Secondary | ICD-10-CM

## 2022-12-28 DIAGNOSIS — Z0000 Encounter for general adult medical examination without abnormal findings: Secondary | ICD-10-CM | POA: Diagnosis not present

## 2022-12-28 DIAGNOSIS — Z23 Encounter for immunization: Secondary | ICD-10-CM

## 2022-12-28 DIAGNOSIS — M329 Systemic lupus erythematosus, unspecified: Secondary | ICD-10-CM

## 2022-12-28 DIAGNOSIS — Z6836 Body mass index (BMI) 36.0-36.9, adult: Secondary | ICD-10-CM

## 2022-12-28 DIAGNOSIS — R41840 Attention and concentration deficit: Secondary | ICD-10-CM

## 2022-12-28 DIAGNOSIS — E782 Mixed hyperlipidemia: Secondary | ICD-10-CM

## 2022-12-28 MED ORDER — OMEPRAZOLE 20 MG PO CPDR: 20.0000 mg | DELAYED_RELEASE_CAPSULE | Freq: Every day | ORAL | 2 refills | Status: AC

## 2022-12-28 NOTE — Patient Instructions (Signed)

## 2022-12-28 NOTE — Progress Notes (Signed)
Jeri Cos Llittleton,acting as a Neurosurgeon for Arnette Felts, FNP.,have documented all relevant documentation on the behalf of Arnette Felts, FNP,as directed by  Arnette Felts, FNP while in the presence of Arnette Felts, FNP.   Subjective:     Patient ID: Rebecca Day , female    DOB: 03-21-1986 , 37 y.o.   MRN: [id number]   Chief Complaint  Patient presents with   Annual Exam    HPI  Here for HM. She is followed by Dr. Candelaria Celeste OB/GYN. She is feeling a lot better. She is to follow up with Dr. Adolphus Birchwood. Patient reports she has been having really bad stomach pain for the past month. She also reports her blood sugar drop down to 58 if she doesn't eat consistently. She is trying to eat more consistently. She is not ready to try again to have a child.   She is having problems with concentrating and focusing. She is not accomplishing as much as she feels she could. Feels like she has been self medicating with caffeine.   Wt Readings from Last 3 Encounters: 12/28/22 : 221 lb 12.8 oz (100.6 kg) 12/15/22 : 229 lb (103.9 kg) 05/27/22 : 268 lb (121.6 kg)        Past Medical History:  Diagnosis Date   Allergy    Anxiety    Asthma    Bacteremia 04/11/2022   Blood transfusion without reported diagnosis    as child    Clotting disorder (HCC)    blood clot after last pregnancy    Elevated blood sugar    Kidney stones    Lupus (HCC)    questionable, no meds or treatment per pt   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HCC)    On total parenteral nutrition (TPN)    Ovarian cyst    PMDD (premenstrual dysphoric disorder)    Septic shock (HCC) 04/04/2022     Family History  Problem Relation Age of Onset   Hypertension Mother    Colon polyps Mother    Cancer Father        dx'd in 11's   Colon cancer Father    Colon cancer Maternal Aunt    Hyperlipidemia Maternal Grandmother    Kidney disease Brother    Liver disease Brother    Cancer Brother    Colon cancer Other    Esophageal cancer Neg Hx     Rectal cancer Neg Hx    Stomach cancer Neg Hx    Breast cancer Neg Hx      Current Outpatient Medications:    megestrol (MEGACE) 40 MG tablet, Take 2 tablets (80 mg total) by mouth 2 (two) times daily., Disp: 360 tablet, Rfl: 3   metoCLOPramide (REGLAN) 10 MG tablet, Take 10 mg by mouth 3 (three) times daily before meals., Disp: , Rfl:    omeprazole (PRILOSEC) 20 MG capsule, Take 1 capsule (20 mg total) by mouth daily., Disp: 30 capsule, Rfl: 2   ondansetron (ZOFRAN) 4 MG tablet, Take 1 tablet (4 mg total) by mouth every 8 (eight) hours as needed for nausea or vomiting., Disp: 12 tablet, Rfl: 0  Current Facility-Administered Medications:    dextrose 5 % solution, , Intravenous, Once, Tressia Danas, MD   Allergies  Allergen Reactions   Penicillins Hives    Has patient had a PCN reaction causing immediate rash, facial/tongue/throat swelling, SOB or lightheadedness with hypotension: Yes Has patient had a PCN reaction causing severe rash involving mucus membranes or skin necrosis: No Has patient had a  PCN reaction that required hospitalization: Yes Has patient had a PCN reaction occurring within the last 10 years: NO If all of the above answers are "NO", then may proceed with Cephalosporin use.   Vancomycin       The patient states she uses none for birth control, she is on megace. Patient's last menstrual period was 12/20/2022.. Negative for Dysmenorrhea and Positive for Menorrhagia. Negative for: breast discharge, breast lump(s), breast pain and breast self exam. Associated symptoms include abnormal vaginal bleeding. Pertinent negatives include abnormal bleeding (hematology), anxiety, decreased libido, depression, difficulty falling sleep, dyspareunia, history of infertility, nocturia, sexual dysfunction, sleep disturbances, urinary incontinence, urinary urgency, vaginal discharge and vaginal itching. Diet regular.The patient states her exercise level is moderate - pilates and 2 mile  walk 2 times a week. She is using her resistance band.   The patient's tobacco use is:  Social History   Tobacco Use  Smoking Status Never  Smokeless Tobacco Never   She has been exposed to passive smoke. The patient's alcohol use is:  Social History   Substance and Sexual Activity  Alcohol Use Yes   Comment: socially  . Additional information: Last pap 12/02/2021, next one scheduled for 12/02/2024.    Review of Systems  Constitutional: Negative.   HENT: Negative.    Eyes: Negative.   Respiratory: Negative.    Cardiovascular: Negative.   Gastrointestinal: Negative.   Endocrine: Negative.   Genitourinary: Negative.   Musculoskeletal: Negative.   Skin: Negative.   Allergic/Immunologic: Negative.   Neurological: Negative.   Hematological: Negative.   Psychiatric/Behavioral: Negative.       Today's Vitals   12/28/22 1143  BP: 112/78  Pulse: 76  Temp: 98.1 F (36.7 C)  Weight: 221 lb 12.8 oz (100.6 kg)  Height: 5' 5.8" (1.671 m)  PainSc: 6   PainLoc: Abdomen   Body mass index is 36.02 kg/m.   Objective:  Physical Exam Vitals reviewed.  Constitutional:      General: She is not in acute distress.    Appearance: Normal appearance. She is well-developed. She is obese.  HENT:     Head: Normocephalic and atraumatic.     Right Ear: Hearing, tympanic membrane, ear canal and external ear normal. There is no impacted cerumen.     Left Ear: Hearing, tympanic membrane, ear canal and external ear normal. There is no impacted cerumen.     Nose:     Comments: Deferred - masked    Mouth/Throat:     Comments: Deferred - masked Eyes:     General: Lids are normal.     Extraocular Movements: Extraocular movements intact.     Conjunctiva/sclera: Conjunctivae normal.     Pupils: Pupils are equal, round, and reactive to light.     Funduscopic exam:    Right eye: No papilledema.        Left eye: No papilledema.  Neck:     Thyroid: No thyroid mass.     Vascular: No carotid  bruit.  Cardiovascular:     Rate and Rhythm: Normal rate and regular rhythm.     Pulses: Normal pulses.     Heart sounds: Normal heart sounds. No murmur heard. Pulmonary:     Effort: Pulmonary effort is normal. No respiratory distress.     Breath sounds: Normal breath sounds. No wheezing.  Chest:     Chest wall: No mass.  Breasts:    Tanner Score is 5.     Right: Normal. No mass or tenderness.  Left: Normal. No mass or tenderness.  Abdominal:     General: Abdomen is flat. Bowel sounds are normal. There is no distension.     Palpations: Abdomen is soft.     Tenderness: There is no abdominal tenderness.  Genitourinary:    Comments: Deferred - followed by Gyn, breast exam deferred done by Dr. Adrian Blackwater and has had a mammogram due to lump to right breast Musculoskeletal:        General: No swelling. Normal range of motion.     Cervical back: Full passive range of motion without pain, normal range of motion and neck supple.     Right lower leg: No edema.     Left lower leg: No edema.  Lymphadenopathy:     Upper Body:     Right upper body: No supraclavicular, axillary or pectoral adenopathy.     Left upper body: No supraclavicular, axillary or pectoral adenopathy.  Skin:    General: Skin is warm and dry.     Capillary Refill: Capillary refill takes less than 2 seconds.  Neurological:     General: No focal deficit present.     Mental Status: She is alert and oriented to person, place, and time.     Cranial Nerves: No cranial nerve deficit.     Sensory: No sensory deficit.     Motor: No weakness.  Psychiatric:        Mood and Affect: Mood normal.        Behavior: Behavior normal.        Thought Content: Thought content normal.        Judgment: Judgment normal.         Assessment And Plan:     1. Encounter for annual physical exam Behavior modifications discussed and diet history reviewed.   Pt will continue to exercise regularly and modify diet with low GI, plant based  foods and decrease intake of processed foods.  Recommend intake of daily multivitamin, Vitamin D, and calcium.  Recommend self breast exams for preventive screenings, as well as recommend immunizations that include influenza, TDAP  2. Class 2 obesity due to excess calories with body mass index (BMI) of 36.0 to 36.9 in adult, unspecified whether serious comorbidity present She is encouraged to strive for BMI less than 30 to decrease cardiac risk. Advised to aim for at least 150 minutes of exercise per week. - Hemoglobin A1c  3. Mixed hyperlipidemia Comments: Cholesterol levels have been controlled, continue focusing on low fat diet. - Lipid panel  4. Lupus (HCC) Comments: Continue f/u with Rheumatology - CMP14+EGFR - CBC  5. Depression, recurrent (HCC) Comments: Depression screen score of 5, will be referring for ADHD they can address this as well.  6. Concentration deficit Comments: She has 13 often or very ofen responses to the self reported adult ADHD scale. Will refer to psychiatry for further evaluation - Ambulatory referral to Psychiatry  7. Need for COVID-19 vaccine Covid 19 vaccine given in office observed for 15 minutes without any adverse reaction - Pfizer Fall 2023 Covid-19 Vaccine 47yrs and older   Patient was given opportunity to ask questions. Patient verbalized understanding of the plan and was able to repeat key elements of the plan. All questions were answered to their satisfaction.   Arnette Felts, FNP   I, Arnette Felts, FNP, have reviewed all documentation for this visit. The documentation on 12/28/22 for the exam, diagnosis, procedures, and orders are all accurate and complete.   THE PATIENT IS ENCOURAGED  TO PRACTICE SOCIAL DISTANCING DUE TO THE COVID-19 PANDEMIC.

## 2022-12-29 LAB — CMP14+EGFR
ALT: 19 IU/L (ref 0–32)
AST: 20 IU/L (ref 0–40)
Albumin/Globulin Ratio: 1.6 (ref 1.2–2.2)
Albumin: 4.5 g/dL (ref 3.9–4.9)
Alkaline Phosphatase: 121 IU/L (ref 44–121)
BUN/Creatinine Ratio: 7 — ABNORMAL LOW (ref 9–23)
BUN: 5 mg/dL — ABNORMAL LOW (ref 6–20)
Bilirubin Total: 0.6 mg/dL (ref 0.0–1.2)
CO2: 20 mmol/L (ref 20–29)
Calcium: 9.7 mg/dL (ref 8.7–10.2)
Chloride: 102 mmol/L (ref 96–106)
Creatinine, Ser: 0.71 mg/dL (ref 0.57–1.00)
Globulin, Total: 2.8 g/dL (ref 1.5–4.5)
Glucose: 77 mg/dL (ref 70–99)
Potassium: 4.2 mmol/L (ref 3.5–5.2)
Sodium: 141 mmol/L (ref 134–144)
Total Protein: 7.3 g/dL (ref 6.0–8.5)
eGFR: 114 mL/min/{1.73_m2} (ref >59)

## 2022-12-29 LAB — LIPID PANEL
Chol/HDL Ratio: 2.9 ratio (ref 0.0–4.4)
Cholesterol, Total: 216 mg/dL — ABNORMAL HIGH (ref 100–199)
HDL: 74 mg/dL (ref >39)
LDL Chol Calc (NIH): 130 mg/dL — ABNORMAL HIGH (ref 0–99)
Triglycerides: 69 mg/dL (ref 0–149)
VLDL Cholesterol Cal: 12 mg/dL (ref 5–40)

## 2022-12-29 LAB — CBC
Hematocrit: 39.9 % (ref 34.0–46.6)
Hemoglobin: 13.2 g/dL (ref 11.1–15.9)
MCH: 29.5 pg (ref 26.6–33.0)
MCHC: 33.1 g/dL (ref 31.5–35.7)
MCV: 89 fL (ref 79–97)
Platelets: 315 10*3/uL (ref 150–450)
RBC: 4.47 x10E6/uL (ref 3.77–5.28)
RDW: 12.3 % (ref 11.7–15.4)
WBC: 3.6 10*3/uL (ref 3.4–10.8)

## 2022-12-29 LAB — HEMOGLOBIN A1C
Est. average glucose Bld gHb Est-mCnc: 100 mg/dL
Hgb A1c MFr Bld: 5.1 % (ref 4.8–5.6)

## 2023-01-10 ENCOUNTER — Encounter: Payer: Self-pay | Admitting: Nurse Practitioner

## 2023-01-31 ENCOUNTER — Encounter (HOSPITAL_COMMUNITY): Payer: Self-pay

## 2023-01-31 ENCOUNTER — Ambulatory Visit (HOSPITAL_COMMUNITY): Payer: No Typology Code available for payment source | Admitting: Psychiatry

## 2023-02-23 ENCOUNTER — Encounter: Payer: Self-pay | Admitting: Family Medicine

## 2023-02-23 ENCOUNTER — Ambulatory Visit (INDEPENDENT_AMBULATORY_CARE_PROVIDER_SITE_OTHER): Payer: No Typology Code available for payment source | Admitting: Family Medicine

## 2023-02-23 DIAGNOSIS — K829 Disease of gallbladder, unspecified: Secondary | ICD-10-CM

## 2023-02-23 DIAGNOSIS — R1011 Right upper quadrant pain: Secondary | ICD-10-CM

## 2023-02-23 DIAGNOSIS — K760 Fatty (change of) liver, not elsewhere classified: Secondary | ICD-10-CM

## 2023-02-23 DIAGNOSIS — K219 Gastro-esophageal reflux disease without esophagitis: Secondary | ICD-10-CM | POA: Diagnosis not present

## 2023-02-23 DIAGNOSIS — Z6835 Body mass index (BMI) 35.0-35.9, adult: Secondary | ICD-10-CM

## 2023-02-23 MED ORDER — SUCRALFATE 1 G PO TABS: 1.0000 g | ORAL_TABLET | Freq: Three times a day (TID) | ORAL | 0 refills | Status: AC

## 2023-02-23 NOTE — Progress Notes (Signed)
I,Victoria T Hamilton, CMA,acting as a Neurosurgeon for [employer], NP.,have documented all relevant documentation on the behalf of Perrin Eddleman, NP,as directed by  Rosia Syme Moshe Salisbury, NP while in the presence of Denay Pleitez, NP.  Subjective:  Patient ID: Rebecca Day , female    DOB: Sep 20, 1985 , 37 y.o.   MRN: [id number]  Chief Complaint  Patient presents with   Hospitalization Follow-up    HPI  Patient presents today for hospital follow up. She visited [facility] EM[facility] on 02/22/23 for Abdominal Pain that has been ongoing for 2 months, her initial visit to the ER, she had US abdomen and CT abdomen which showed hepatic steatosis but no masses. Today, she reports feeling " awful". She states that right upper Quadrant abdominal pain has been ongoing for two months and she has been having oily stools,orange,yellow in , bile reflux(green,yellow in color She admits taking Omeprazole at home, which has not helped.  She is established with Gastroenterologist( [facility]) she went there on 02/01/2023 and she was asked to do a bowel clean out which she did with MiraLax but it did not help She reports not having any appetite. Patient had gastric bypass in 01/2022 and the Bariatric Team at [facility] thinks she had gallbladder sludge and needs a HIDA scan. They sent her to her GI team but they asked her to go get a clean instead. Patient states that it has been 2 weeks since her clean out and she still regurgitates bile, and still having fatty ,oily stools, so will refer to general surgery for evaluation of Gallbladder , so they they can decide on the next step forward    Abdominal Pain This is a recurrent problem. The current episode started more than 1 month ago. The onset quality is gradual. The problem occurs daily. The most recent episode lasted 2 months. The problem has been gradually worsening. The pain is  located in the epigastric region and RUQ. The pain is at a severity of 5/10. The pain is moderate. The quality of the pain is aching, a sensation of fullness and sharp. The abdominal pain radiates to the chest and back. Associated symptoms include diarrhea and vomiting. Associated symptoms comments: Regurgitating bile and passing out ,oily and fatty stools. The pain is aggravated by eating and certain positions. The pain is relieved by Nothing. She has tried proton pump inhibitors for the symptoms. Prior diagnostic workup includes GI consult and CT scan. Her past medical history is significant for abdominal surgery.     Past Medical History:  Diagnosis Date   Allergy    Anxiety    Asthma    Bacteremia 04/11/2022   Blood transfusion without reported diagnosis    as child    Clotting disorder (HCC)    blood clot after last pregnancy    Elevated blood sugar    Kidney stones    Lupus (HCC)    questionable, no meds or treatment per pt   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HCC)    On total parenteral nutrition (TPN)    Ovarian cyst    PMDD (premenstrual dysphoric disorder)    Septic shock (HCC) 04/04/2022     Family History  Problem Relation Age of Onset   Hypertension Mother    Colon polyps Mother    Cancer Father        dx'd in 20's   Colon cancer Father    Colon cancer Maternal  Aunt    Hyperlipidemia Maternal Grandmother    Kidney disease Brother    Liver disease Brother    Cancer Brother    Colon cancer Other    Esophageal cancer Neg Hx    Rectal cancer Neg Hx    Stomach cancer Neg Hx    Breast cancer Neg Hx      Current Outpatient Medications:    megestrol (MEGACE) 40 MG tablet, Take 2 tablets (80 mg total) by mouth 2 (two) times daily., Disp: 360 tablet, Rfl: 3   omeprazole (PRILOSEC) 20 MG capsule, Take 1 capsule (20 mg total) by mouth daily., Disp: 30 capsule, Rfl: 2   ondansetron (ZOFRAN) 4 MG tablet, Take 1 tablet (4 mg total) by mouth every 8 (eight) hours as needed for nausea or  vomiting., Disp: 12 tablet, Rfl: 0   sucralfate (CARAFATE) 1 g tablet, Take 1 tablet (1 g total) by mouth 3 (three) times daily before meals., Disp: 90 tablet, Rfl: 0   metoCLOPramide (REGLAN) 10 MG tablet, Take 10 mg by mouth 3 (three) times daily before meals. (Patient not taking: Reported on 02/23/2023), Disp: , Rfl:   Current Facility-Administered Medications:    dextrose 5 % solution, , Intravenous, Once, Tressia Danas, MD   Allergies  Allergen Reactions   Penicillins Hives    Has patient had a PCN reaction causing immediate rash, facial/tongue/throat swelling, SOB or lightheadedness with hypotension: Yes Has patient had a PCN reaction causing severe rash involving mucus membranes or skin necrosis: No Has patient had a PCN reaction that required hospitalization: Yes Has patient had a PCN reaction occurring within the last 10 years: NO If all of the above answers are "NO", then may proceed with Cephalosporin use.   Vancomycin      Review of Systems  Constitutional: Negative.   Respiratory: Negative.    Cardiovascular: Negative.   Gastrointestinal:  Positive for abdominal pain, diarrhea and vomiting.  Neurological: Negative.   Psychiatric/Behavioral: Negative.       Today's Vitals   02/23/23 1520  BP: 122/84  Pulse: 78  Temp: 98 F (36.7 C)  SpO2: 98%  Weight: 215 lb 3.2 oz (97.6 kg)  Height: 5\' 5"  (1.651 m)   Body mass index is 35.81 kg/m.  Wt Readings from Last 3 Encounters:  02/23/23 215 lb 3.2 oz (97.6 kg)  12/28/22 221 lb 12.8 oz (100.6 kg)  12/15/22 229 lb (103.9 kg)     Objective:  Physical Exam Cardiovascular:     Rate and Rhythm: Normal rate and regular rhythm.  Pulmonary:     Effort: Pulmonary effort is normal.     Breath sounds: Normal breath sounds.  Abdominal:     Tenderness: There is abdominal tenderness.  Neurological:     General: No focal deficit present.     Mental Status: She is alert and oriented to person, place, and time.          Assessment And Plan:  Gallbladder disease -     Ambulatory referral to General Surgery  Hepatic steatosis  Right upper quadrant abdominal pain  Gastroesophageal reflux disease, unspecified whether esophagitis present -     Sucralfate; Take 1 tablet (1 g total) by mouth 3 (three) times daily before meals.  Dispense: 90 tablet; Refill: 0  Class 2 severe obesity due to excess calories with serious comorbidity and body mass index (BMI) of 35.0 to 35.9 in adult [facility])  She is encouraged to strive for BMI less than 30 to decrease  cardiac risk. Advised to aim for at least 150 minutes of exercise per week.    Return for 6 month chol f/u.Marland Kitchen  Patient was given opportunity to ask questions. Patient verbalized understanding of the plan and was able to repeat key elements of the plan. All questions were answered to their satisfaction.  Kimani Hovis Moshe Salisbury, NP  I, Mirely Pangle Moshe Salisbury, NP, have reviewed all documentation for this visit. The documentation on 02/28/23 for the exam, diagnosis, procedures, and orders are all accurate and complete.   IF YOU HAVE BEEN REFERRED TO A SPECIALIST, IT MAY TAKE 1-2 WEEKS TO SCHEDULE/PROCESS THE REFERRAL. IF YOU HAVE NOT HEARD FROM US/SPECIALIST IN TWO WEEKS, PLEASE GIVE [place] A CALL AT [phone].   THE PATIENT IS ENCOURAGED TO PRACTICE SOCIAL DISTANCING DUE TO THE COVID-19 PANDEMIC.

## 2023-02-23 NOTE — Patient Instructions (Signed)
Abdominal Pain, Adult  Pain in the abdomen (abdominal pain) can be caused by many things. In most cases, it gets better with no treatment or by being treated at home. But in some cases, it can be serious. Your health care provider will ask questions about your medical history and do a physical exam to try to figure out what is causing your pain. Follow these instructions at home: Medicines Take over-the-counter and prescription medicines only as told by your provider. Do not take medicines that help you poop (laxatives) unless told by your provider. General instructions Watch your condition for any changes. Drink enough fluid to keep your pee (urine) pale yellow. Contact a health care provider if: Your pain changes, gets worse, or lasts longer than expected. You have severe cramping or bloating in your abdomen, or you vomit. Your pain gets worse with meals, after eating, or with certain foods. You are constipated or have diarrhea for more than 2-3 days. You are not hungry, or you lose weight without trying. You have signs of dehydration. These may include: Dark pee, very little pee, or no pee. Cracked lips or dry mouth. Sleepiness or weakness. You have pain when you pee (urinate) or poop. Your abdominal pain wakes you up at night. You have blood in your pee. You have a fever. Get help right away if: You cannot stop vomiting. Your pain is only in one part of the abdomen. Pain on the right side could be caused by appendicitis. You have bloody or black poop (stool), or poop that looks like tar. You have trouble breathing. You have chest pain. These symptoms may be an emergency. Get help right away. Call 911. Do not wait to see if the symptoms will go away. Do not drive yourself to the hospital. This information is not intended to replace advice given to you by your health care provider. Make sure you discuss any questions you have with your health care provider. Document Revised:  06/02/2022 Document Reviewed: 06/02/2022 Elsevier Patient Education  2024 Elsevier Inc.  

## 2023-02-28 ENCOUNTER — Encounter: Payer: Self-pay | Admitting: Family Medicine

## 2023-02-28 DIAGNOSIS — K760 Fatty (change of) liver, not elsewhere classified: Secondary | ICD-10-CM | POA: Insufficient documentation

## 2023-02-28 DIAGNOSIS — R1011 Right upper quadrant pain: Secondary | ICD-10-CM | POA: Insufficient documentation

## 2023-02-28 DIAGNOSIS — K219 Gastro-esophageal reflux disease without esophagitis: Secondary | ICD-10-CM | POA: Insufficient documentation

## 2023-02-28 DIAGNOSIS — K829 Disease of gallbladder, unspecified: Secondary | ICD-10-CM | POA: Insufficient documentation

## 2023-02-28 NOTE — Assessment & Plan Note (Signed)
She is encouraged to strive for BMI less than 30 to decrease cardiac risk. Advised to aim for at least 150 minutes of exercise per week.  

## 2023-03-15 ENCOUNTER — Telehealth: Payer: Self-pay

## 2023-03-15 NOTE — Transitions of Care (Post Inpatient/ED Visit) (Signed)
   03/15/2023  Name: Rebecca Day MRN: [id number] DOB: [birth year]  Today's TOC FU Call Status: Today's TOC FU Call Status:: Successful TOC FU Call Competed TOC FU Call Complete Date: 03/15/23  Transition Care Management Follow-up Telephone Call Date of Discharge: 03/12/23 Discharge Facility: [facility]) Type of Discharge: Emergency Department Reason for ED Visit: Other: How have you been since you were released from the hospital?: Same Any questions or concerns?: Yes Patient Questions/Concerns:: pt has a list Patient Questions/Concerns Addressed: Provided Patient Educational Materials  Items Reviewed: Did you receive and understand the discharge instructions provided?: Yes Medications obtained,verified, and reconciled?: Yes (Medications Reviewed) Any new allergies since your discharge?: No Dietary orders reviewed?: NA Do you have support at home?: Yes People in Home: parent(s) Name of Support/Comfort Primary Source: Melissa G  Medications Reviewed Today: Medications Reviewed Today     Reviewed by Ellender Hose, NP (Nurse Practitioner) on 02/28/23 at 1018  Med List Status: <None>   Medication Order Taking? Sig Documenting Provider Last Dose Status Informant  dextrose 5 % solution [id number]   Tressia Danas, MD  Active   megestrol (MEGACE) 40 MG tablet [id number] Yes Take 2 tablets (80 mg total) by mouth 2 (two) times daily. Levie Heritage, DO Taking Active   metoCLOPramide (REGLAN) 10 MG tablet [id number] No Take 10 mg by mouth 3 (three) times daily before meals.  Patient not taking: Reported on 02/23/2023   [provider] Not Taking Active   omeprazole (PRILOSEC) 20 MG capsule [id number] Yes Take 1 capsule (20 mg total) by mouth daily. Arnette Felts, FNP Taking Active   ondansetron [facility]) 4 MG tablet [id number] Yes Take 1 tablet (4 mg total) by mouth every 8 (eight) hours as needed for nausea or vomiting. Blue, Soijett A, PA-C Taking  Active   sucralfate (CARAFATE) 1 g tablet [id number] Yes Take 1 tablet (1 g total) by mouth 3 (three) times daily before meals. Ellender Hose, NP  Active             Home Care and Equipment/Supplies: Were Home Health Services Ordered?: NA Any new equipment or medical supplies ordered?: NA  Functional Questionnaire: Do you need assistance with bathing/showering or dressing?: No Do you need assistance with meal preparation?: No Do you need assistance with eating?: No Do you have difficulty maintaining continence: No Do you need assistance with getting out of bed/getting out of a chair/moving?: No Do you have difficulty managing or taking your medications?: No  Follow up appointments reviewed: PCP Follow-up appointment confirmed?: Yes Date of PCP follow-up appointment?: 03/16/23 Follow-up Provider: Ellender Hose [facility] Specialist [facility] Follow-up appointment confirmed?: Yes Date of Specialist follow-up appointment?: 04/04/23 Follow-Up Specialty Provider:: Dr.Tapparo Do you need transportation to your follow-up appointment?: No Do you understand care options if your condition(s) worsen?: Yes-patient verbalized understanding    SIGNATURE Yl,RMA

## 2023-03-16 ENCOUNTER — Ambulatory Visit: Payer: No Typology Code available for payment source | Admitting: Family Medicine

## 2023-03-16 ENCOUNTER — Encounter: Payer: Self-pay | Admitting: Family Medicine

## 2023-03-16 VITALS — BP 110/80 | HR 64 | Temp 98.1°F | Ht 65.0 in | Wt 211.0 lb

## 2023-03-16 DIAGNOSIS — K829 Disease of gallbladder, unspecified: Secondary | ICD-10-CM

## 2023-03-16 DIAGNOSIS — R1011 Right upper quadrant pain: Secondary | ICD-10-CM

## 2023-03-16 DIAGNOSIS — R1114 Bilious vomiting: Secondary | ICD-10-CM

## 2023-03-16 MED ORDER — ONDANSETRON HCL 4 MG PO TABS
4.0000 mg | ORAL_TABLET | Freq: Three times a day (TID) | ORAL | 0 refills | Status: AC | PRN
Start: 2023-03-16 — End: ?

## 2023-03-16 NOTE — Progress Notes (Signed)
I,Jameka J Llittleton, CMA,acting as a Neurosurgeon for [employer], NP.,have documented all relevant documentation on the behalf of Owin Vignola, NP,as directed by  Norelle Runnion Moshe Salisbury, NP while in the presence of Lalania Haseman, NP.  Subjective:  Patient ID: Rebecca Day , female    DOB: [birth year] , 37 y.o.   MRN: [id number]  Chief Complaint  Patient presents with   hospital f/u    HPI  Patient presents today for ER follow up. Patient went to the ER on 03/13/23 for abdominal pain, gallbladder disease flare up. She has seen Dr Buzzy Han , General Surgery for consultation and she reported she is scheduled to have her surgery on 04/04/23.     Past Medical History:  Diagnosis Date   Allergy    Anxiety    Asthma    Bacteremia 04/11/2022   Blood transfusion without reported diagnosis    as child    Clotting disorder (HCC)    blood clot after last pregnancy    Elevated blood sugar    Kidney stones    Lupus (HCC)    questionable, no meds or treatment per pt   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HCC)    On total parenteral nutrition (TPN)    Ovarian cyst    PMDD (premenstrual dysphoric disorder)    Septic shock (HCC) 04/04/2022     Family History  Problem Relation Age of Onset   Hypertension Mother    Colon polyps Mother    Cancer Father        dx'd in 16's   Colon cancer Father    Colon cancer Maternal Aunt    Hyperlipidemia Maternal Grandmother    Kidney disease Brother    Liver disease Brother    Cancer Brother    Colon cancer Other    Esophageal cancer Neg Hx    Rectal cancer Neg Hx    Stomach cancer Neg Hx    Breast cancer Neg Hx      Current Outpatient Medications:    megestrol (MEGACE) 40 MG tablet, Take 2 tablets (80 mg total) by mouth 2 (two) times daily., Disp: 360 tablet, Rfl: 3   metoCLOPramide (REGLAN) 10 MG tablet, Take 10 mg by mouth 3 (three) times daily before meals., Disp: , Rfl:    omeprazole (PRILOSEC) 20 MG capsule, Take 1 capsule (20 mg total) by mouth daily., Disp: 30 capsule, Rfl:  2   sucralfate (CARAFATE) 1 g tablet, Take 1 tablet (1 g total) by mouth 3 (three) times daily before meals., Disp: 90 tablet, Rfl: 0   ondansetron (ZOFRAN) 4 MG tablet, Take 1 tablet (4 mg total) by mouth every 8 (eight) hours as needed for nausea or vomiting., Disp: 12 tablet, Rfl: 0  Current Facility-Administered Medications:    dextrose 5 % solution, , Intravenous, Once, Tressia Danas, MD   Allergies  Allergen Reactions   Penicillins Hives    Has patient had a PCN reaction causing immediate rash, facial/tongue/throat swelling, SOB or lightheadedness with hypotension: Yes Has patient had a PCN reaction causing severe rash involving mucus membranes or skin necrosis: No Has patient had a PCN reaction that required hospitalization: Yes Has patient had a PCN reaction occurring within the last 10 years: NO If all of the above answers are "NO", then may proceed with Cephalosporin use.   Vancomycin      Review of Systems  Constitutional: Negative.   Eyes: Negative.   Respiratory: Negative.    Cardiovascular: Negative.   Gastrointestinal:  Positive for abdominal pain,  nausea and vomiting. Negative for blood in stool, constipation and rectal pain.  Skin: Negative.   Psychiatric/Behavioral: Negative.       Today's Vitals   03/16/23 1602  BP: 110/80  Pulse: 64  Temp: 98.1 F (36.7 C)  Weight: 211 lb (95.7 kg)  Height: 5\' 5"  (1.651 m)  PainSc: 0-No pain   Body mass index is 35.11 kg/m.  Wt Readings from Last 3 Encounters:  03/16/23 211 lb (95.7 kg)  02/23/23 215 lb 3.2 oz (97.6 kg)  12/28/22 221 lb 12.8 oz (100.6 kg)     Objective:  Physical Exam HENT:     Head: Normocephalic.  Cardiovascular:     Rate and Rhythm: Normal rate and regular rhythm.  Pulmonary:     Effort: Pulmonary effort is normal.     Breath sounds: Normal breath sounds.  Abdominal:     Tenderness: There is abdominal tenderness.  Neurological:     Mental Status: She is alert.          Assessment And Plan:  Gallbladder disease Assessment & Plan: Scheduled for surgery on 04/04/2023 with Dr Buzzy Han, [facility]   Bilious vomiting with nausea Assessment & Plan: Take Zofran as prescribed  Orders: -     Ondansetron HCl; Take 1 tablet (4 mg total) by mouth every 8 (eight) hours as needed for nausea or vomiting.  Dispense: 12 tablet; Refill: 0  RUQ abdominal pain     Return if symptoms worsen or fail to improve, for Keep previous appt.  Patient was given opportunity to ask questions. Patient verbalized understanding of the plan and was able to repeat key elements of the plan. All questions were answered to their satisfaction.  Shmuel Girgis Moshe Salisbury, NP  I, Braylin Formby Moshe Salisbury, NP, have reviewed all documentation for this visit. The documentation on 03/25/23 for the exam, diagnosis, procedures, and orders are all accurate and complete.   IF YOU HAVE BEEN REFERRED TO A SPECIALIST, IT MAY TAKE 1-2 WEEKS TO SCHEDULE/PROCESS THE REFERRAL. IF YOU HAVE NOT HEARD FROM US/SPECIALIST IN TWO WEEKS, PLEASE GIVE [place] A CALL AT [phone].   THE PATIENT IS ENCOURAGED TO PRACTICE SOCIAL DISTANCING DUE TO THE COVID-19 PANDEMIC.

## 2023-03-25 DIAGNOSIS — R1114 Bilious vomiting: Secondary | ICD-10-CM | POA: Insufficient documentation

## 2023-03-25 NOTE — Assessment & Plan Note (Signed)
Take Zofran as prescribed.

## 2023-03-25 NOTE — Assessment & Plan Note (Signed)
Scheduled for surgery on 04/04/2023 with Dr Buzzy Han, [facility]

## 2023-04-06 ENCOUNTER — Telehealth: Payer: Self-pay

## 2023-04-06 NOTE — Telephone Encounter (Signed)
Left pt vm letting her know her forms have been completed and faxed. Forms placed up front. YL,RMA

## 2023-05-06 ENCOUNTER — Other Ambulatory Visit: Payer: Self-pay | Admitting: Medical Genetics

## 2023-05-06 DIAGNOSIS — Z006 Encounter for examination for normal comparison and control in clinical research program: Secondary | ICD-10-CM

## 2023-08-03 ENCOUNTER — Encounter (HOSPITAL_COMMUNITY): Payer: Self-pay

## 2023-08-03 ENCOUNTER — Other Ambulatory Visit: Payer: Self-pay

## 2023-08-03 ENCOUNTER — Emergency Department (HOSPITAL_COMMUNITY): Payer: No Typology Code available for payment source

## 2023-08-03 ENCOUNTER — Emergency Department (HOSPITAL_COMMUNITY)
Admission: EM | Admit: 2023-08-03 | Discharge: 2023-08-03 | Disposition: A | Discharge status: Home / Self Care | Payer: No Typology Code available for payment source | Attending: Emergency Medicine | Admitting: Emergency Medicine

## 2023-08-03 DIAGNOSIS — R630 Anorexia: Secondary | ICD-10-CM | POA: Insufficient documentation

## 2023-08-03 DIAGNOSIS — R1011 Right upper quadrant pain: Secondary | ICD-10-CM | POA: Insufficient documentation

## 2023-08-03 DIAGNOSIS — R110 Nausea: Secondary | ICD-10-CM | POA: Insufficient documentation

## 2023-08-03 DIAGNOSIS — K219 Gastro-esophageal reflux disease without esophagitis: Secondary | ICD-10-CM | POA: Insufficient documentation

## 2023-08-03 LAB — URINALYSIS, ROUTINE W REFLEX MICROSCOPIC
Bacteria, UA: NONE SEEN
Bilirubin Urine: NEGATIVE
Glucose, UA: NEGATIVE mg/dL
Ketones, ur: NEGATIVE mg/dL
Leukocytes,Ua: NEGATIVE
Nitrite: NEGATIVE
Protein, ur: NEGATIVE mg/dL
Specific Gravity, Urine: 1.017 (ref 1.005–1.030)
pH: 6 (ref 5.0–8.0)

## 2023-08-03 LAB — CBC WITH DIFFERENTIAL/PLATELET
Abs Immature Granulocytes: 0 10*3/uL (ref 0.00–0.07)
Basophils Absolute: 0 10*3/uL (ref 0.0–0.1)
Basophils Relative: 1 %
Eosinophils Absolute: 0.1 10*3/uL (ref 0.0–0.5)
Eosinophils Relative: 2 %
HCT: 45.5 % (ref 36.0–46.0)
Hemoglobin: 15.2 g/dL — ABNORMAL HIGH (ref 12.0–15.0)
Immature Granulocytes: 0 %
Lymphocytes Relative: 52 %
Lymphs Abs: 2.2 10*3/uL (ref 0.7–4.0)
MCH: 30.6 pg (ref 26.0–34.0)
MCHC: 33.4 g/dL (ref 30.0–36.0)
MCV: 91.7 fL (ref 80.0–100.0)
Monocytes Absolute: 0.3 10*3/uL (ref 0.1–1.0)
Monocytes Relative: 6 %
Neutro Abs: 1.6 10*3/uL — ABNORMAL LOW (ref 1.7–7.7)
Neutrophils Relative %: 39 %
Platelets: 240 10*3/uL (ref 150–400)
RBC: 4.96 MIL/uL (ref 3.87–5.11)
RDW: 11.9 % (ref 11.5–15.5)
WBC: 4.2 10*3/uL (ref 4.0–10.5)
nRBC: 0 % (ref 0.0–0.2)

## 2023-08-03 LAB — COMPREHENSIVE METABOLIC PANEL
ALT: 22 U/L (ref 0–44)
AST: 16 U/L (ref 15–41)
Albumin: 4.2 g/dL (ref 3.5–5.0)
Alkaline Phosphatase: 85 U/L (ref 38–126)
Anion gap: 9 (ref 5–15)
BUN: 7 mg/dL (ref 6–20)
CO2: 21 mmol/L — ABNORMAL LOW (ref 22–32)
Calcium: 9.3 mg/dL (ref 8.9–10.3)
Chloride: 109 mmol/L (ref 98–111)
Creatinine, Ser: 0.69 mg/dL (ref 0.44–1.00)
GFR, Estimated: >60 mL/min (ref >60)
Glucose, Bld: 88 mg/dL (ref 70–99)
Potassium: 3.5 mmol/L (ref 3.5–5.1)
Sodium: 139 mmol/L (ref 135–145)
Total Bilirubin: 0.9 mg/dL (ref <1.2)
Total Protein: 7.3 g/dL (ref 6.5–8.1)

## 2023-08-03 LAB — PREGNANCY, URINE: Preg Test, Ur: NEGATIVE

## 2023-08-03 LAB — LIPASE, BLOOD: Lipase: 25 U/L (ref 11–51)

## 2023-08-03 LAB — TROPONIN I (HIGH SENSITIVITY): Troponin I (High Sensitivity): 2 ng/L (ref <18)

## 2023-08-03 MED ORDER — MORPHINE SULFATE (PF) 4 MG/ML IV SOLN
4.0000 mg | Freq: Once | INTRAVENOUS | Status: AC
Start: 2023-08-03 — End: 2023-08-03
  Administered 2023-08-03: 4 mg via INTRAVENOUS
  Filled 2023-08-03: qty 1

## 2023-08-03 MED ORDER — MORPHINE SULFATE (PF) 4 MG/ML IV SOLN
4.0000 mg | Freq: Once | INTRAVENOUS | Status: AC
  Administered 2023-08-03: 4 mg via INTRAVENOUS
  Filled 2023-08-03: qty 1

## 2023-08-03 MED ORDER — LACTATED RINGERS IV BOLUS: 1000.0000 mL | Freq: Once | INTRAVENOUS | Status: DC

## 2023-08-03 MED ORDER — LIDOCAINE VISCOUS HCL 2 % MT SOLN
15.0000 mL | Freq: Once | OROMUCOSAL | Status: AC
  Administered 2023-08-03: 15 mL via ORAL
  Filled 2023-08-03: qty 15

## 2023-08-03 MED ORDER — HYDROCODONE-ACETAMINOPHEN 5-325 MG PO TABS: 1.0000 | ORAL_TABLET | Freq: Four times a day (QID) | ORAL | 0 refills | Status: AC | PRN

## 2023-08-03 MED ORDER — ALUM & MAG HYDROXIDE-SIMETH 200-200-20 MG/5ML PO SUSP
30.0000 mL | Freq: Once | ORAL | Status: AC
  Administered 2023-08-03: 30 mL via ORAL
  Filled 2023-08-03: qty 30

## 2023-08-03 MED ORDER — ONDANSETRON HCL 4 MG/2ML IJ SOLN
4.0000 mg | Freq: Once | INTRAMUSCULAR | Status: AC
  Administered 2023-08-03: 4 mg via INTRAVENOUS
  Filled 2023-08-03: qty 2

## 2023-08-03 MED ORDER — FAMOTIDINE IN NACL 20-0.9 MG/50ML-% IV SOLN
20.0000 mg | Freq: Once | INTRAVENOUS | Status: AC
  Administered 2023-08-03: 20 mg via INTRAVENOUS
  Filled 2023-08-03: qty 50

## 2023-08-03 MED ORDER — IOHEXOL 300 MG/ML  SOLN
100.0000 mL | Freq: Once | INTRAMUSCULAR | Status: AC | PRN
Start: 2023-08-03 — End: 2023-08-03
  Administered 2023-08-03: 100 mL via INTRAVENOUS

## 2023-08-03 MED ORDER — ONDANSETRON HCL 4 MG PO TABS: 4.0000 mg | ORAL_TABLET | Freq: Four times a day (QID) | ORAL | 0 refills | Status: AC

## 2023-08-03 NOTE — ED Notes (Signed)
Patient transported to CT 

## 2023-08-03 NOTE — ED Provider Notes (Signed)
[place] EMERGENCY DEPARTMENT AT [facility] Provider Note   CSN: [id number] Arrival date & time: 08/03/23  0865     History  Chief Complaint  Patient presents with   Abdominal Pain    Rebecca Day is a 37 y.o. female status post gastric sleeve, history of lupus, lupus nephritis, TPN, blood transfusions, septic shock, GERD, status post cholecystectomy August 2024 presented for right upper quadrant abdominal pain that began yesterday at work.  Patient states that she does not feel that she can get comfortable and that the pain starts in the right upper quadrant and goes to the epigastric region and sometimes radiates up into the lower chest.  Patient has any cardiac history or any respiratory symptoms.  Patient states she has been persistently nauseous without vomiting.  Patient states she has had decreased appetite since eating does seem to exacerbate the symptoms.  Patient does note the last time she had endoscopy done by her GI specialist the anastomosis from the gastric sleeve did show reddening in the area in which they are treating as an ulcer.  Home Medications Prior to Admission medications   Medication Sig Start Date End Date Taking? Authorizing Provider  HYDROcodone-acetaminophen (NORCO) 5-325 MG tablet Take 1 tablet by mouth every 6 (six) hours as needed for up to 3 days for moderate pain (pain score 4-6). 08/03/23 08/06/23 Yes Rozena Fierro, Beverly Gust, PA-C  ondansetron (ZOFRAN) 4 MG tablet Take 1 tablet (4 mg total) by mouth every 6 (six) hours. 08/03/23  Yes Ky Rumple, Beverly Gust, PA-C  megestrol (MEGACE) 40 MG tablet Take 2 tablets (80 mg total) by mouth 2 (two) times daily. 08/19/22   Levie Heritage, DO  metoCLOPramide (REGLAN) 10 MG tablet Take 10 mg by mouth 3 (three) times daily before meals.    [provider]  omeprazole (PRILOSEC) 20 MG capsule Take 1 capsule (20 mg total) by mouth daily. 12/28/22   Arnette Felts, FNP  ondansetron (ZOFRAN) 4 MG tablet Take 1  tablet (4 mg total) by mouth every 8 (eight) hours as needed for nausea or vomiting. 03/16/23   Ellender Hose, NP  sucralfate (CARAFATE) 1 g tablet Take 1 tablet (1 g total) by mouth 3 (three) times daily before meals. 02/23/23 03/25/23  Ellender Hose, NP      Allergies    Penicillins and Vancomycin    Review of Systems   Review of Systems  Physical Exam Updated Vital Signs BP (!) 149/111   Pulse 68   Temp 98 F (36.7 C) (Oral)   Resp 14   SpO2 100%  Physical Exam Vitals reviewed.  Constitutional:      General: She is not in acute distress.    Comments: Appears uncomfortable but is not in any distress  HENT:     Head: Normocephalic and atraumatic.  Eyes:     Extraocular Movements: Extraocular movements intact.     Conjunctiva/sclera: Conjunctivae normal.     Pupils: Pupils are equal, round, and reactive to light.  Cardiovascular:     Rate and Rhythm: Normal rate and regular rhythm.     Pulses: Normal pulses.     Heart sounds: Normal heart sounds.     Comments: 2+ bilateral radial/dorsalis pedis pulses with regular rate Pulmonary:     Effort: Pulmonary effort is normal. No respiratory distress.     Breath sounds: Normal breath sounds.  Abdominal:     Palpations: Abdomen is soft.     Tenderness: There is abdominal tenderness (Right  upper quadrant). There is no guarding or rebound.  Musculoskeletal:        General: Normal range of motion.     Cervical back: Normal range of motion and neck supple.     Comments: 5 out of 5 bilateral grip/leg extension strength  Skin:    General: Skin is warm and dry.     Capillary Refill: Capillary refill takes less than 2 seconds.  Neurological:     General: No focal deficit present.     Mental Status: She is alert and oriented to person, place, and time.     Comments: Sensation intact in all 4 limbs  Psychiatric:        Mood and Affect: Mood normal.     ED Results / Procedures / Treatments   Labs (all labs ordered are listed, but  only abnormal results are displayed) Labs Reviewed  CBC WITH DIFFERENTIAL/PLATELET - Abnormal; Notable for the following components:      Result Value   Hemoglobin 15.2 (*)    Neutro Abs 1.6 (*)    All other components within normal limits  COMPREHENSIVE METABOLIC PANEL - Abnormal; Notable for the following components:   CO2 21 (*)    All other components within normal limits  URINALYSIS, ROUTINE W REFLEX MICROSCOPIC - Abnormal; Notable for the following components:   Hgb urine dipstick MODERATE (*)    All other components within normal limits  LIPASE, BLOOD  PREGNANCY, URINE  TROPONIN I (HIGH SENSITIVITY)    EKG EKG Interpretation Date/Time:  Wednesday August 03 2023 07:59:38 EST Ventricular Rate:  68 PR Interval:  137 QRS Duration:  84 QT Interval:  382 QTC Calculation: 407 R Axis:   68  Text Interpretation: Sinus rhythm Confirmed by Vonita Moss [phone]) on 08/03/2023 10:03:54 AM  Radiology CT ABDOMEN PELVIS W CONTRAST  Result Date: 08/03/2023 CLINICAL DATA:  Right upper quadrant pain, history of gastric ulcer with gastric sleeve EXAM: CT ABDOMEN AND PELVIS WITH CONTRAST TECHNIQUE: Multidetector CT imaging of the abdomen and pelvis was performed using the standard protocol following bolus administration of intravenous contrast. RADIATION DOSE REDUCTION: This exam was performed according to the departmental dose-optimization program which includes automated exposure control, adjustment of the mA and/or kV according to patient size and/or use of iterative reconstruction technique. CONTRAST:  OMNIPAQUE IOHEXOL 300 MG/ML  SOLN COMPARISON:  CT AP, 03/13/2019 and 02/23/2023. FINDINGS: Lower chest: No acute abnormality. Hepatobiliary: No focal liver abnormality. cholecystectomy. No biliary dilatation. Pancreas: No pancreatic ductal dilatation or surrounding inflammatory changes. Spleen: Normal in size without focal abnormality. Adrenals/Urinary Tract: Adrenal glands are  unremarkable. Kidneys are normal, without renal calculi, focal lesion, or hydronephrosis. Bladder is unremarkable. Stomach/Bowel: Postsurgical changes of Roux-en-Y gastric bypass with intact-appearing anastomoses. Excluded stomach is within normal limits. Appendix appears normal. No evidence of bowel wall thickening, distention, or inflammatory changes. Vascular/Lymphatic: Partial mesenteric swirling at the LEFT central abdomen. No evidence of vascular compromise to bowel. No enlarged abdominal or pelvic lymph nodes. Reproductive: Uterus and adnexa are unremarkable.  Labial adornment. Other: No abdominal wall hernia or abnormality. No abdominopelvic ascites. Musculoskeletal: No acute or significant osseous findings. IMPRESSION: 1. No acute abdominopelvic findings. 2. Postsurgical changes of gastric bypass without CT evidence of acute complication. Additional incidental, chronic and senescent findings as above. Electronically Signed   By: Roanna Banning M.D.   On: 08/03/2023 11:55   DG Chest Port 1 View  Result Date: 08/03/2023 CLINICAL DATA:  Abdominal pain radiating to the chest EXAM:  PORTABLE CHEST 1 VIEW COMPARISON:  Chest radiograph dated 05/05/2022 FINDINGS: Normal lung volumes. No focal consolidations. No pleural effusion or pneumothorax. The heart size and mediastinal contours are within normal limits. No acute osseous abnormality. IMPRESSION: No active disease. Electronically Signed   By: Agustin Cree M.D.   On: 08/03/2023 08:26    Procedures Procedures    Medications Ordered in ED Medications  famotidine (PEPCID) IVPB 20 mg premix (0 mg Intravenous Stopped 08/03/23 0834)  ondansetron (ZOFRAN) injection 4 mg (4 mg Intravenous Given 08/03/23 0758)  morphine (PF) 4 MG/ML injection 4 mg (4 mg Intravenous Given 08/03/23 0758)  alum & mag hydroxide-simeth (MAALOX/MYLANTA) 200-200-20 MG/5ML suspension 30 mL (30 mLs Oral Given 08/03/23 1012)    And  lidocaine (XYLOCAINE) 2 % viscous mouth solution 15 mL (15  mLs Oral Given 08/03/23 1012)  iohexol (OMNIPAQUE) 300 MG/ML solution 100 mL (100 mLs Intravenous Contrast Given 08/03/23 1120)  morphine (PF) 4 MG/ML injection 4 mg (4 mg Intravenous Given 08/03/23 1157)    ED Course/ Medical Decision Making/ A&P                                 Medical Decision Making Amount and/or Complexity of Data Reviewed Labs: ordered. Radiology: ordered.  Risk Prescription drug management.   Dalanie Crownover 37 y.o. presented today for right quadrant pain. Working DDx that I considered at this time includes, but not limited to, dumping syndrome, gastric spasms, gastroenteritis, colitis, small bowel obstruction, appendicitis, cholecystitis, hepatobiliary pathology, gastritis, PUD, ACS, aortic dissection pancreatitis, nephrolithiasis, AAA, UTI, pyelonephritis, ruptured ectopic pregnancy, PID, ovarian torsion.  R/o DDx: gastroenteritis, colitis, small bowel obstruction, appendicitis, cholecystitis, hepatobiliary pathology, gastritis, PUD, ACS, aortic dissection pancreatitis, nephrolithiasis, AAA, UTI, pyelonephritis, ruptured ectopic pregnancy, PID, ovarian torsion: These are considered less likely due to history of present illness, physical exam, labs/imaging findings.  Review of prior external notes: 06/22/2023 office visit bariatrics  Unique Tests and My Interpretation:  CBC with differential: Unremarkable CMP: Unremarkable Lipase: Unremarkable UA: Moderate hemoglobin Urine Pregnancy: Moderate hemoglobin EKG: Rate, rhythm, axis, intervals all examined and without medically relevant abnormality. ST segments without concerns for elevations Chest x-ray: Unremarkable Troponin: 2 CT abdomen pelvis with contrast: No acute findings  Social Determinants of Health: none  Discussion with Independent Historian: None  Discussion of Management of Tests: None  Risk: Medium: prescription drug management  Risk Stratification Score: None  Plan: On exam patient was in  no acute distress with stable vitals.  On exam patient does have right upper quadrant tenderness however no peritoneal signs.  The rest of patient's was exam was largely unremarkable.  Patient was seen back in September for similar symptoms in which she had negative labs and imaging.  Patient states the pain is different now as it is radiating up into the lower chest region.  Pain does get exacerbated by eating and so do suspect this is GI in nature however given the description of symptoms we will get 1 troponin as it has been about a day of symptoms along with chest x-ray and EKG to rule out ACS.  Abdominal labs ordered along with Pepcid, Zofran, morphine.  Upon chart review it appears when patient went to go see bariatrics on 06/22/2023 they are concerned that she was having possible dumping syndrome with the gastric sleeve and so unclear if this is the cause of patient's pain today but would explain her symptoms as well.  Patient's labs all came back reassuring.  Patient states that she thinks this may be the gastric spasms that her GI specialist was telling her about and that she feels that she will need to follow-up with her GI specialist.  After shared decision making given patient's pain patient feels that since this is different that she would want a CT scan which is reasonable as patient does have complicated abdominal history.  If CT scan is negative will discharge with follow-up with her GI specialist along with pain meds and Zofran.  Patient CT scan was unremarkable.  I spoke to the patient and patient states that she feels comfortable being discharged and following up with her GI specialist in regards to her symptoms as she states that she was originally told she will need to follow-up with them and her general surgeon.  Encourage patient to continue taking her medications as prescribed will add on Norco and Zofran.  Patient has passed p.o. challenge and at this time safe to be  discharged.  Patient was given return precautions. Patient stable for discharge at this time.  Patient verbalized understanding of plan.  This chart was dictated using voice recognition software.  Despite best efforts to proofread,  errors can occur which can change the documentation meaning.         Final Clinical Impression(s) / ED Diagnoses Final diagnoses:  Right upper quadrant abdominal pain    Rx / DC Orders ED Discharge Orders          Ordered    HYDROcodone-acetaminophen (NORCO) 5-325 MG tablet  Every 6 hours PRN        08/03/23 1211    ondansetron (ZOFRAN) 4 MG tablet  Every 6 hours        08/03/23 1211              Remi Deter 08/03/23 1212    Rondel Baton, MD 08/06/23 [phone]

## 2023-08-03 NOTE — Discharge Instructions (Signed)
Please follow-up with your GI specialist in regards recent ER visit.  Today your labs and imaging are reassuring and this may be stomach spasms from her gastric sleeve or related to your dumping syndrome.  Please monitor your diet and I have given you a few days worth of pain meds along with Zofran.  Please continue your medications as prescribed.  If symptoms change or worsen please return to ER.

## 2023-08-03 NOTE — ED Triage Notes (Addendum)
Pt Abdominal pain since yesterday, worse with movement  Denies nausea, vomiting and diarrhea  Hx Gastric Bypass 2.5 years ago

## 2023-08-14 ENCOUNTER — Other Ambulatory Visit: Payer: Self-pay

## 2023-08-14 ENCOUNTER — Encounter (HOSPITAL_BASED_OUTPATIENT_CLINIC_OR_DEPARTMENT_OTHER): Payer: Self-pay

## 2023-08-14 ENCOUNTER — Emergency Department (HOSPITAL_BASED_OUTPATIENT_CLINIC_OR_DEPARTMENT_OTHER): Payer: No Typology Code available for payment source

## 2023-08-14 ENCOUNTER — Emergency Department (HOSPITAL_BASED_OUTPATIENT_CLINIC_OR_DEPARTMENT_OTHER)
Admission: EM | Admit: 2023-08-14 | Discharge: 2023-08-14 | Disposition: A | Discharge status: Home / Self Care | Payer: No Typology Code available for payment source | Attending: Emergency Medicine | Admitting: Emergency Medicine

## 2023-08-14 DIAGNOSIS — R109 Unspecified abdominal pain: Secondary | ICD-10-CM

## 2023-08-14 DIAGNOSIS — R1032 Left lower quadrant pain: Secondary | ICD-10-CM | POA: Diagnosis present

## 2023-08-14 LAB — URINALYSIS, MICROSCOPIC (REFLEX)

## 2023-08-14 LAB — CBC WITH DIFFERENTIAL/PLATELET
Abs Immature Granulocytes: 0.01 10*3/uL (ref 0.00–0.07)
Basophils Absolute: 0 10*3/uL (ref 0.0–0.1)
Basophils Relative: 1 %
Eosinophils Absolute: 0.1 10*3/uL (ref 0.0–0.5)
Eosinophils Relative: 2 %
HCT: 42 % (ref 36.0–46.0)
Hemoglobin: 14.2 g/dL (ref 12.0–15.0)
Immature Granulocytes: 0 %
Lymphocytes Relative: 59 %
Lymphs Abs: 3 10*3/uL (ref 0.7–4.0)
MCH: 30.7 pg (ref 26.0–34.0)
MCHC: 33.8 g/dL (ref 30.0–36.0)
MCV: 90.7 fL (ref 80.0–100.0)
Monocytes Absolute: 0.3 10*3/uL (ref 0.1–1.0)
Monocytes Relative: 7 %
Neutro Abs: 1.6 10*3/uL — ABNORMAL LOW (ref 1.7–7.7)
Neutrophils Relative %: 31 %
Platelets: 261 10*3/uL (ref 150–400)
RBC: 4.63 MIL/uL (ref 3.87–5.11)
RDW: 11.5 % (ref 11.5–15.5)
WBC: 5.1 10*3/uL (ref 4.0–10.5)
nRBC: 0 % (ref 0.0–0.2)

## 2023-08-14 LAB — COMPREHENSIVE METABOLIC PANEL
ALT: 26 U/L (ref 0–44)
AST: 24 U/L (ref 15–41)
Albumin: 3.9 g/dL (ref 3.5–5.0)
Alkaline Phosphatase: 83 U/L (ref 38–126)
Anion gap: 6 (ref 5–15)
BUN: 9 mg/dL (ref 6–20)
CO2: 21 mmol/L — ABNORMAL LOW (ref 22–32)
Calcium: 8.8 mg/dL — ABNORMAL LOW (ref 8.9–10.3)
Chloride: 110 mmol/L (ref 98–111)
Creatinine, Ser: 0.8 mg/dL (ref 0.44–1.00)
GFR, Estimated: >60 mL/min (ref >60)
Glucose, Bld: 86 mg/dL (ref 70–99)
Potassium: 3.9 mmol/L (ref 3.5–5.1)
Sodium: 137 mmol/L (ref 135–145)
Total Bilirubin: 0.7 mg/dL (ref <1.2)
Total Protein: 7 g/dL (ref 6.5–8.1)

## 2023-08-14 LAB — URINALYSIS, ROUTINE W REFLEX MICROSCOPIC
Bilirubin Urine: NEGATIVE
Glucose, UA: NEGATIVE mg/dL
Ketones, ur: NEGATIVE mg/dL
Leukocytes,Ua: NEGATIVE
Nitrite: NEGATIVE
Protein, ur: NEGATIVE mg/dL
Specific Gravity, Urine: >=1.03 (ref 1.005–1.030)
pH: 6 (ref 5.0–8.0)

## 2023-08-14 LAB — PREGNANCY, URINE: Preg Test, Ur: NEGATIVE

## 2023-08-14 LAB — LIPASE, BLOOD: Lipase: 25 U/L (ref 11–51)

## 2023-08-14 IMAGING — CR DG KNEE COMPLETE 4+V*L*
4 series · 4 of 4 positions shown · non-contrast
Comparison: None.

CLINICAL DATA: Status post fall.

EXAM:
LEFT KNEE - COMPLETE 4+ VIEW

[w knee ap left]
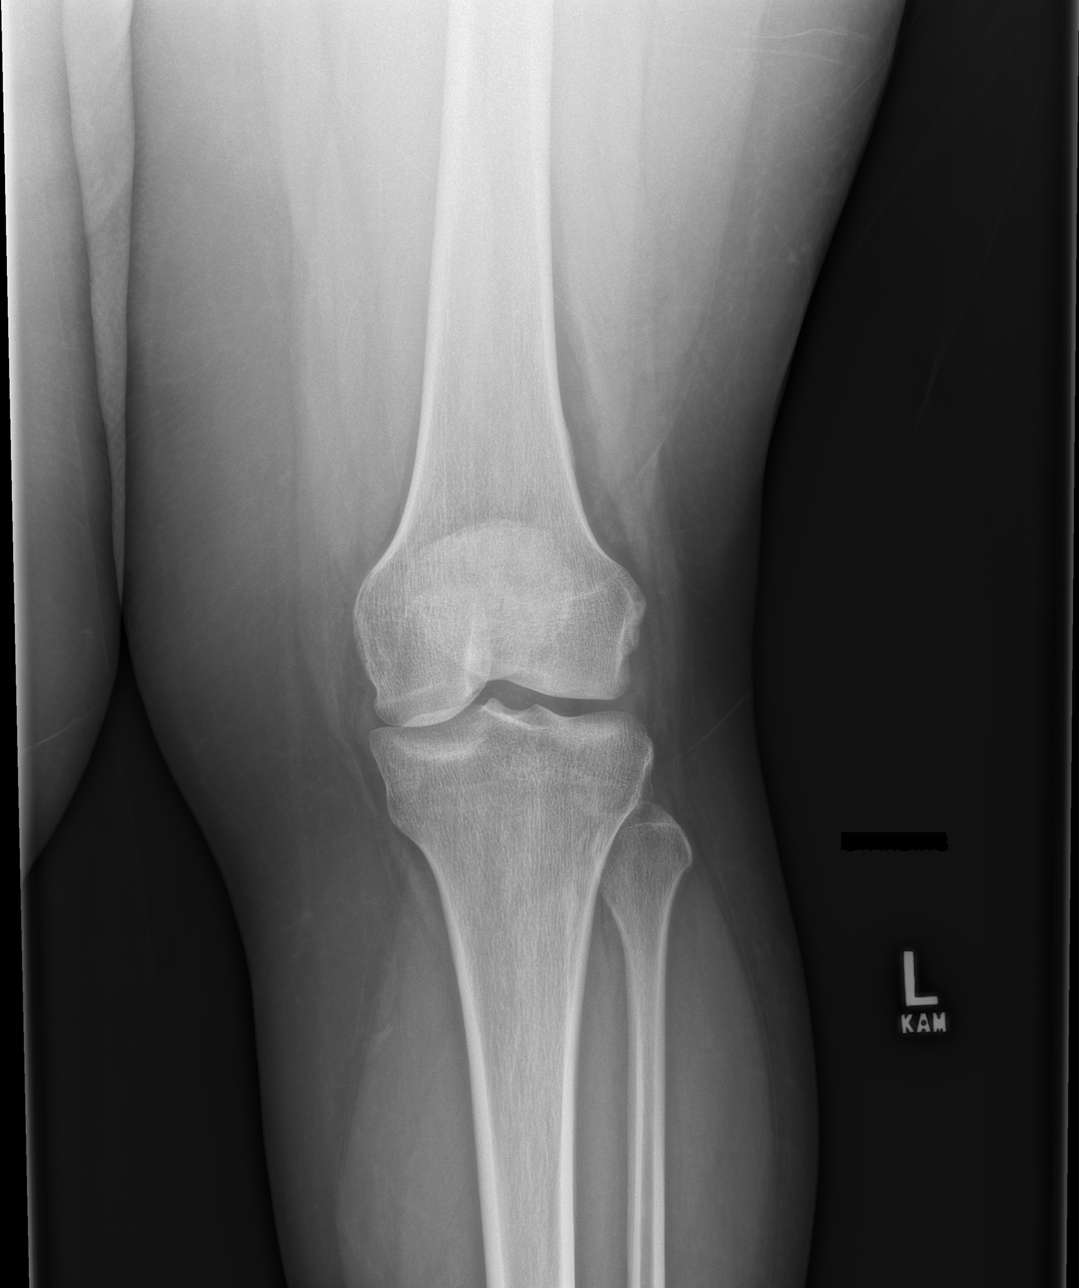

[w knee lat left]
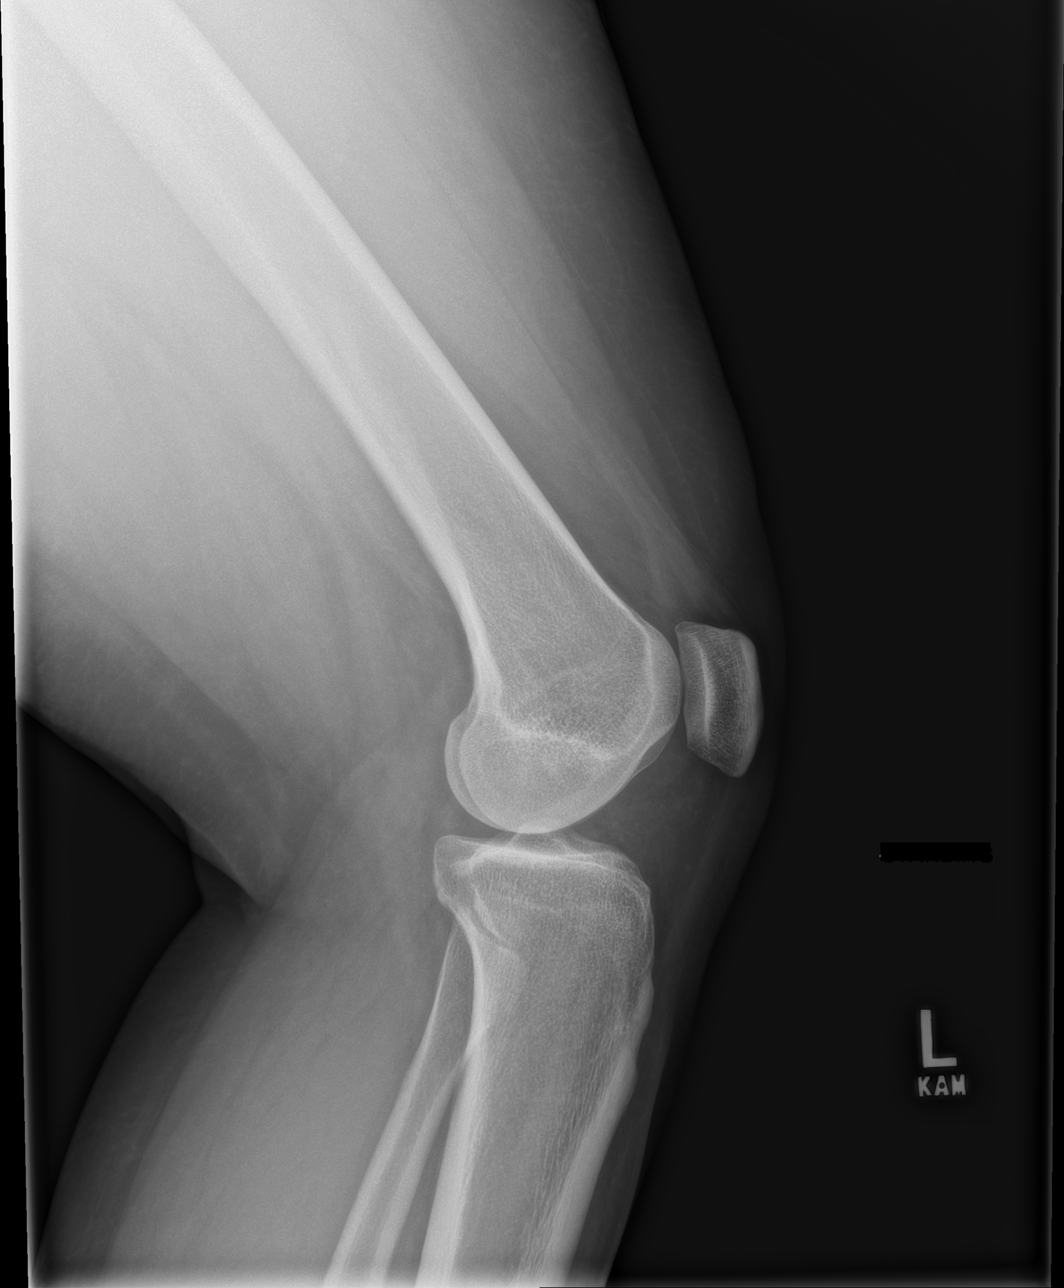

[w knee tunnel pa left]
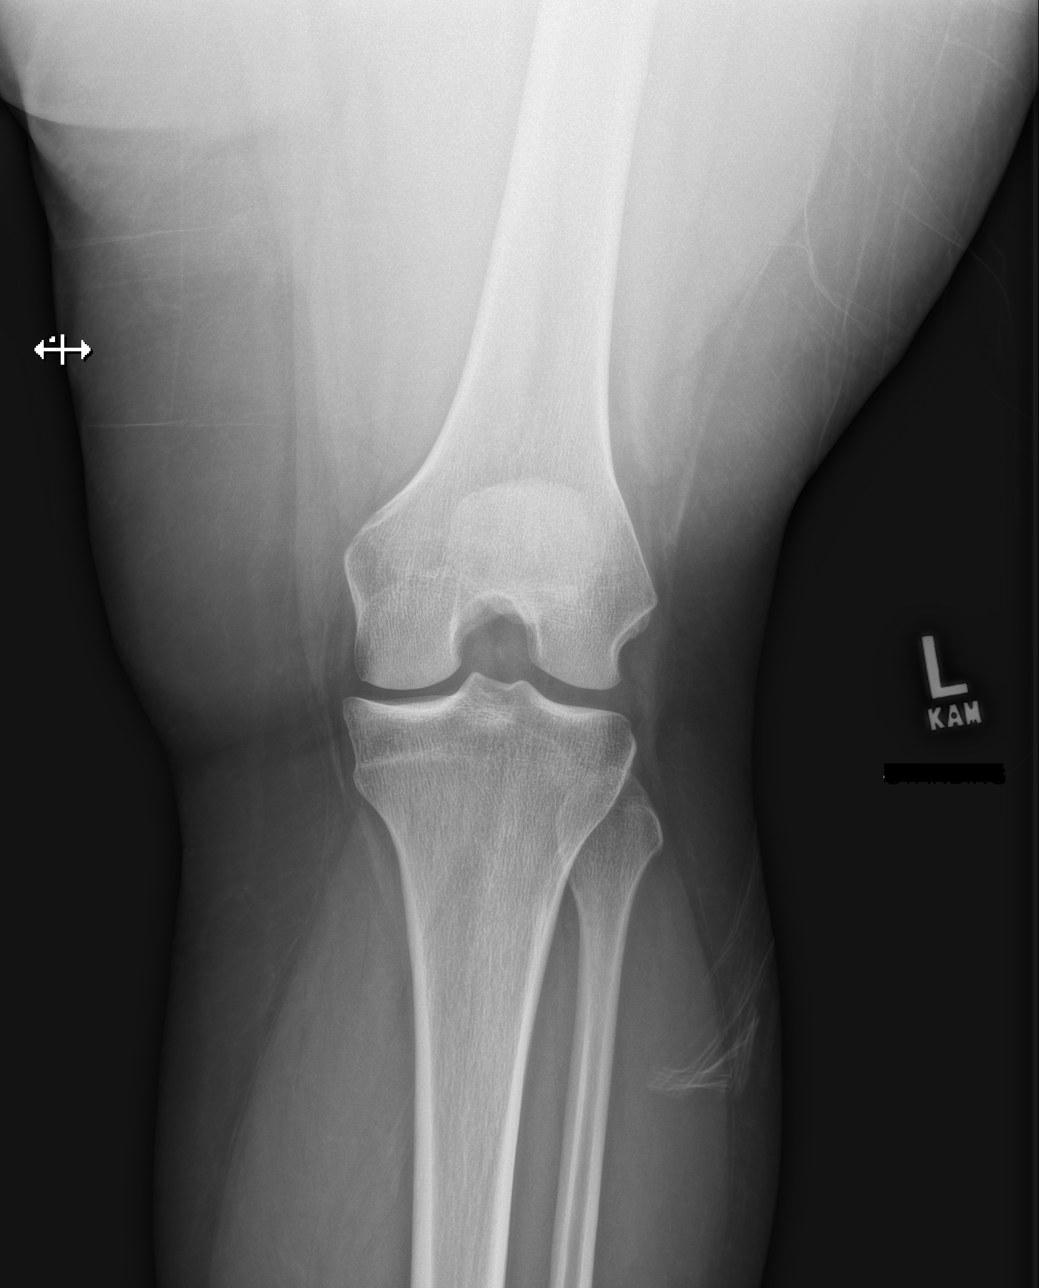

[x knee sunrise left]
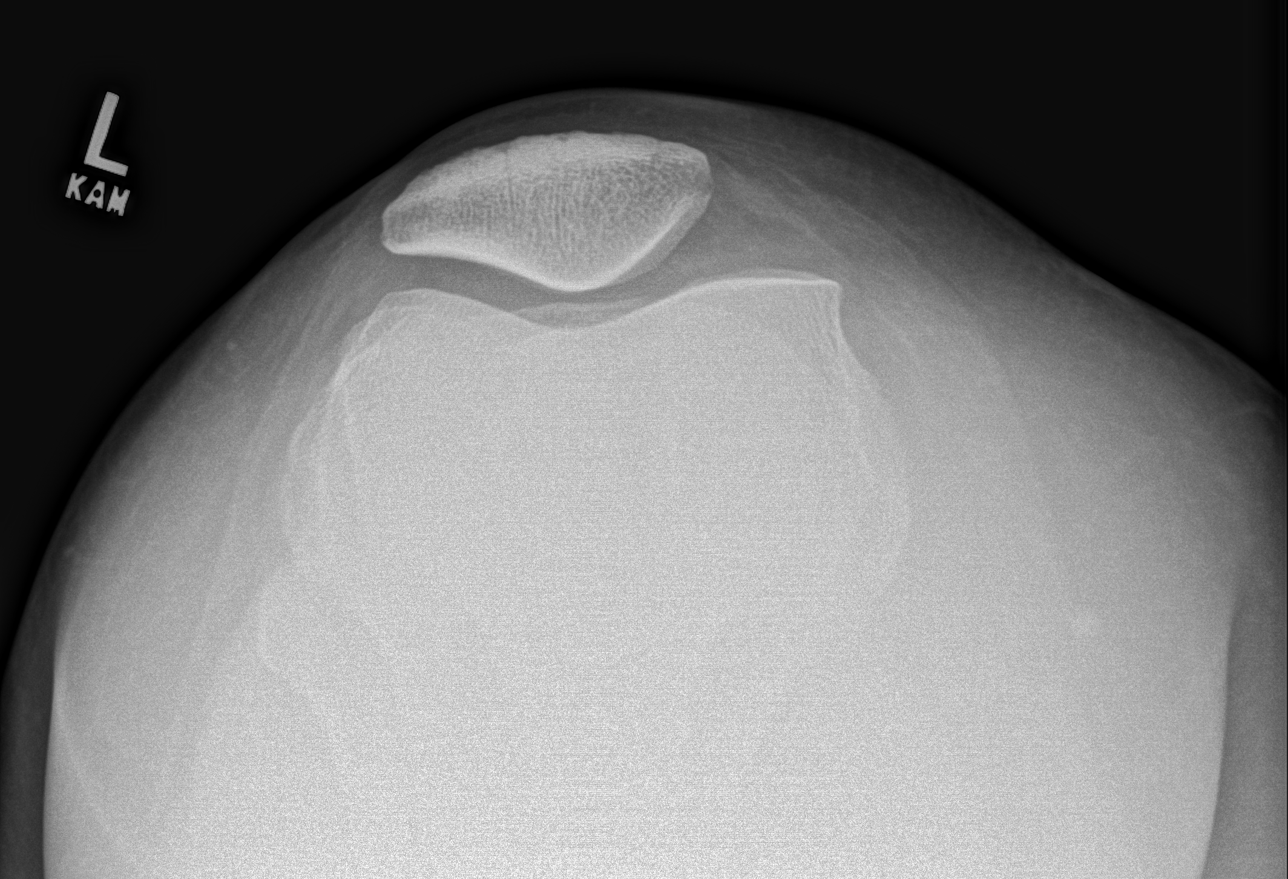

[4 of 4 positions shown; findings below may reference images not displayed]

FINDINGS: No evidence of acute fracture or dislocation. No evidence of
arthropathy or other focal bone abnormality. A small joint effusion
is seen.
IMPRESSION: 1. No acute fracture or dislocation.
2. Small joint effusion.

## 2023-08-14 IMAGING — CR DG THORACIC SPINE 3V
4 series · 4 of 4 positions shown · non-contrast
Comparison: None.

CLINICAL DATA: Status post fall.

EXAM:
THORACIC SPINE - 3 VIEWS

[w thoracic spine ap]
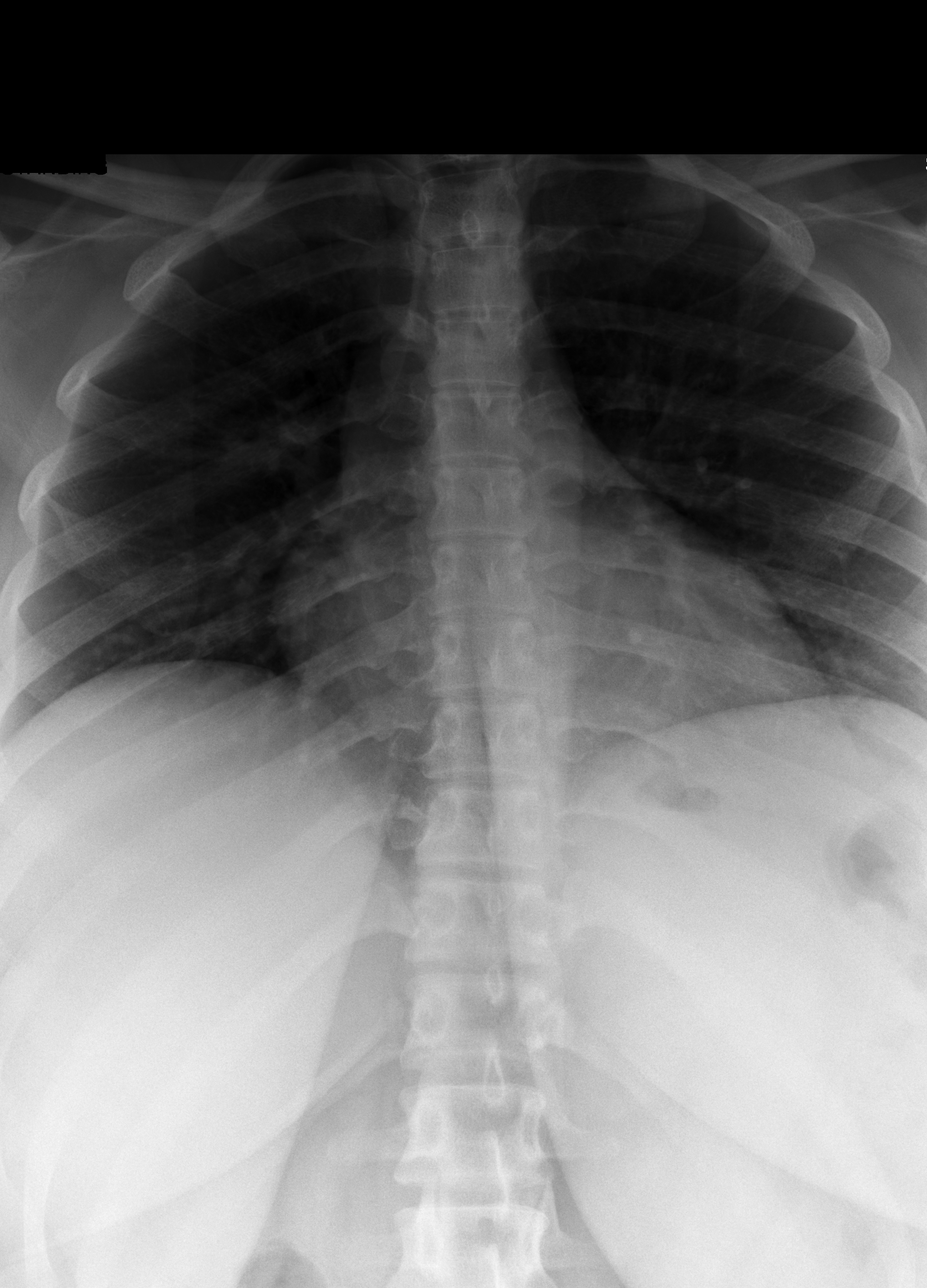

[w thoracic spine lat]
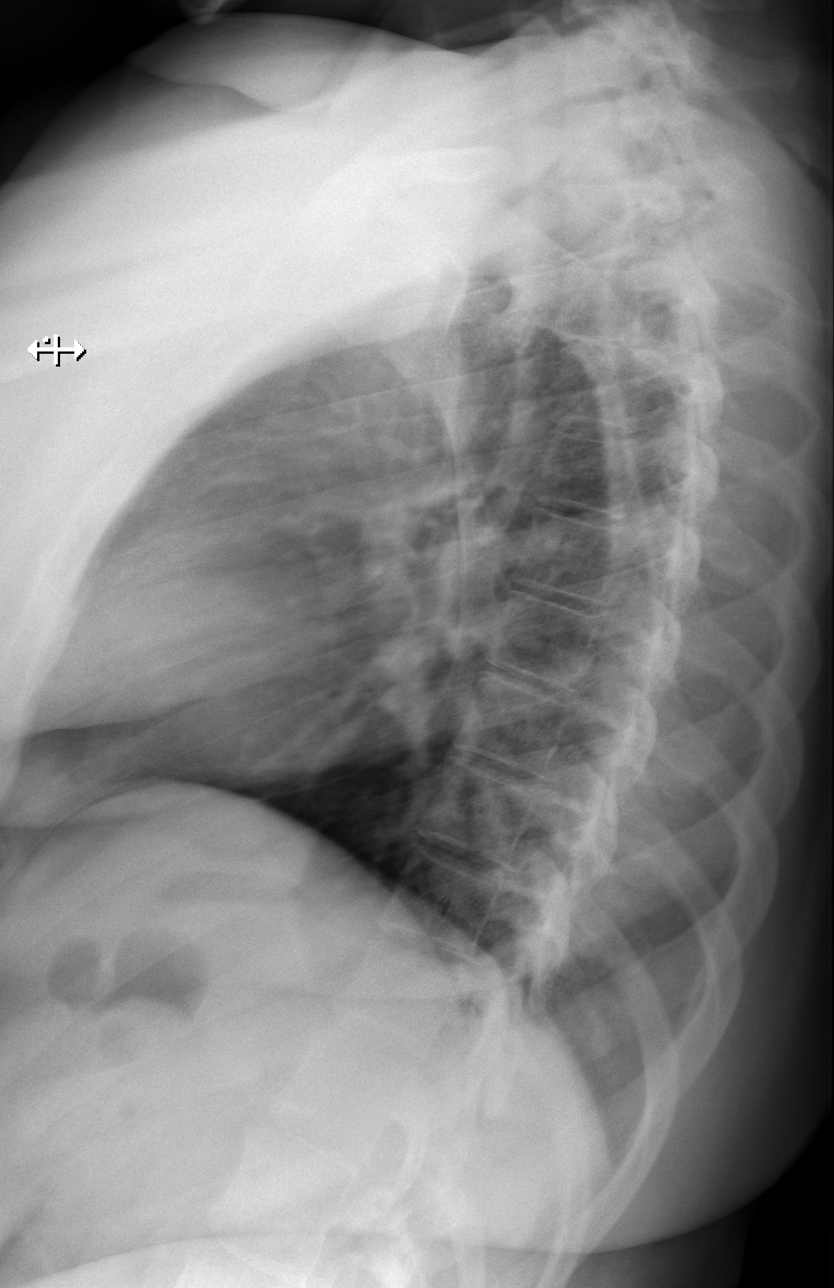

[w thoracic swimmers]
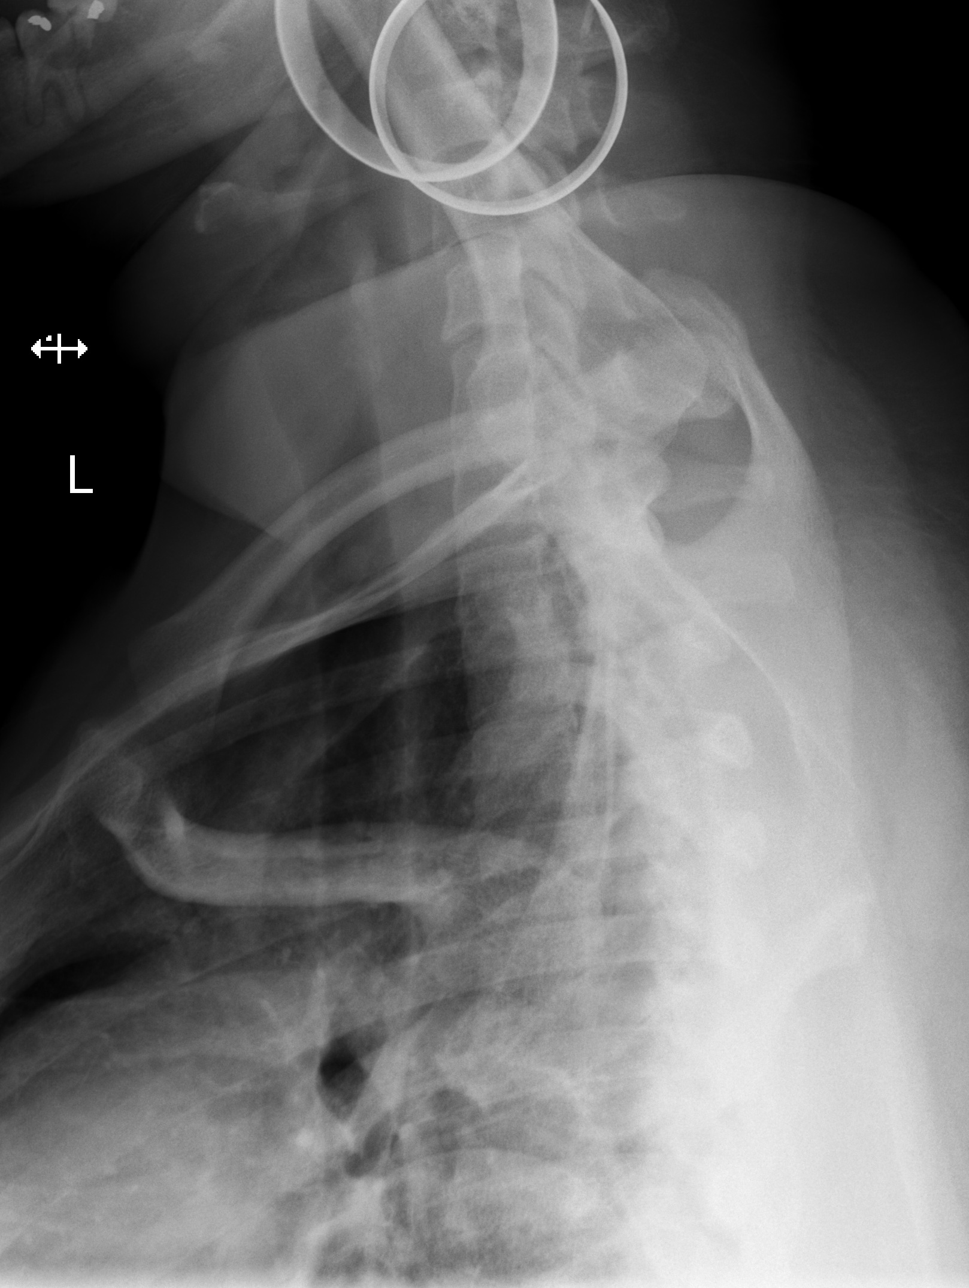

[t thoracic breathing lat]
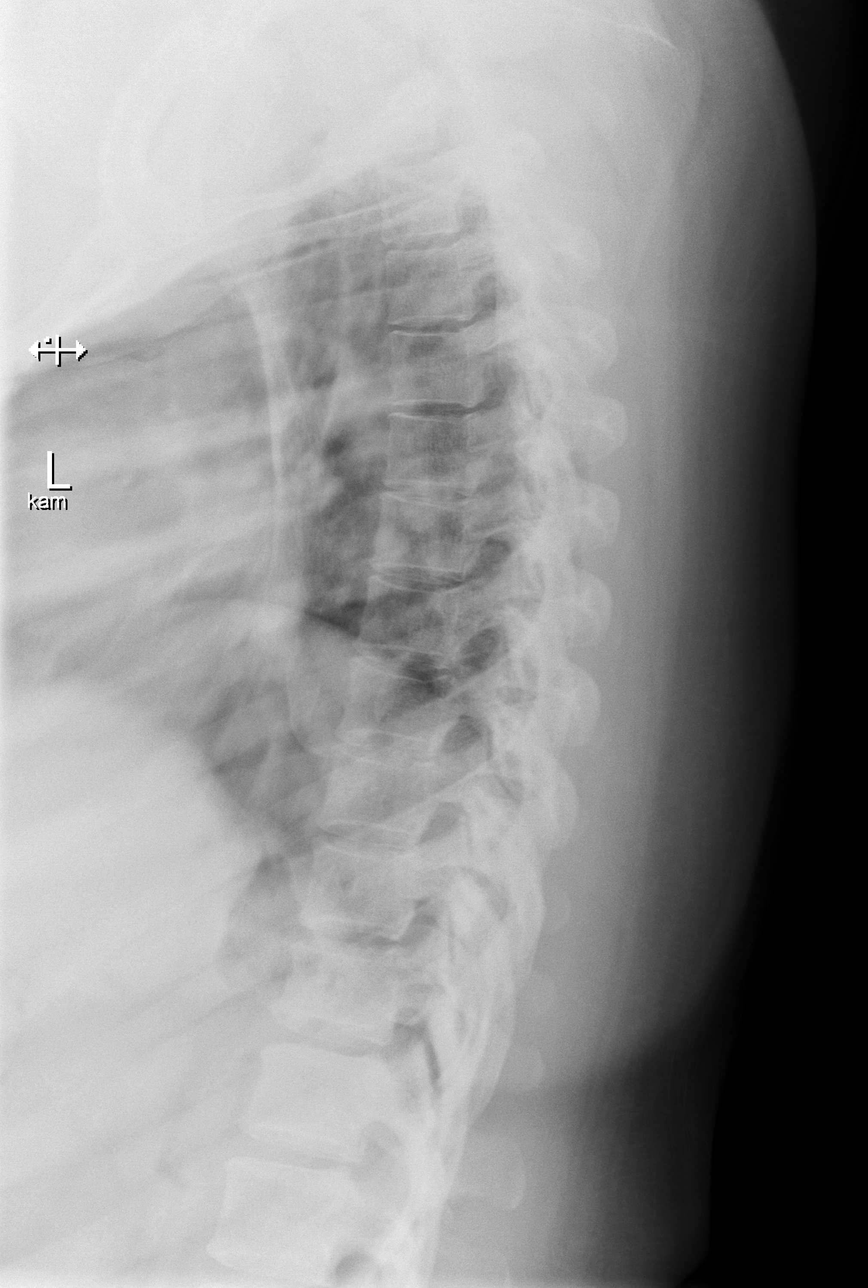

[4 of 4 positions shown; findings below may reference images not displayed]

FINDINGS: There is no evidence of thoracic spine fracture. Alignment is
normal. No other significant bone abnormalities are identified.
IMPRESSION: Negative.

## 2023-08-14 MED ORDER — SODIUM CHLORIDE 0.9 % IV BOLUS
1000.0000 mL | Freq: Once | INTRAVENOUS | Status: AC
  Administered 2023-08-14: 1000 mL via INTRAVENOUS

## 2023-08-14 MED ORDER — IOHEXOL 300 MG/ML  SOLN
100.0000 mL | Freq: Once | INTRAMUSCULAR | Status: AC | PRN
  Administered 2023-08-14: 100 mL via INTRAVENOUS

## 2023-08-14 MED ORDER — ONDANSETRON HCL 4 MG/2ML IJ SOLN
4.0000 mg | Freq: Once | INTRAMUSCULAR | Status: AC
Start: 2023-08-14 — End: 2023-08-14
  Administered 2023-08-14: 4 mg via INTRAVENOUS
  Filled 2023-08-14: qty 2

## 2023-08-14 MED ORDER — MORPHINE SULFATE (PF) 4 MG/ML IV SOLN
6.0000 mg | Freq: Once | INTRAVENOUS | Status: AC
  Administered 2023-08-14: 6 mg via INTRAVENOUS
  Filled 2023-08-14: qty 2

## 2023-08-14 NOTE — ED Triage Notes (Signed)
Pt with LLQ pain since 0800; unable to tolerate liquids d/t pain; hx gastric bypass/sleeve June 2023

## 2023-08-14 NOTE — ED Provider Notes (Signed)
[place] [facility] Provider Note   CSN: [id number] Arrival date & time: 08/14/23  1609     History Chief Complaint  Patient presents with   Abdominal Pain    HPI Rebecca Day is a 37 y.o. female presenting for acute on chronic abdominal pain.  Primarily in the left lower quadrant.  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  Has follow-up with her surgeon in 48 hours.  States that today she felt sudden onset worsening pain.  She saw her GI specialist earlier had a similar outpatient scan that was negative approximately 2 weeks ago but states symptoms are much worse today.  Denies fevers chills nausea vomiting syncope shortness of breath otherwise...   Patient's recorded medical, surgical, social, medication list and allergies were reviewed in the Snapshot window as part of the initial history.   Review of Systems   Review of Systems  Constitutional:  Negative for chills and fever.  HENT:  Negative for ear pain and sore throat.   Eyes:  Negative for pain and visual disturbance.  Respiratory:  Negative for cough and shortness of breath.   Cardiovascular:  Negative for chest pain and palpitations.  Gastrointestinal:  Positive for abdominal pain and nausea. Negative for vomiting.  Genitourinary:  Negative for dysuria and hematuria.  Musculoskeletal:  Negative for arthralgias and back pain.  Skin:  Negative for color change and rash.  Neurological:  Negative for seizures and syncope.  All other systems reviewed and are negative.   Physical Exam Updated Vital Signs BP (!) 130/90   Pulse 64   Temp 97.7 F (36.5 C) (Oral)   Resp 18   Ht 5\' 8"  (1.727 m)   Wt 95.3 kg   SpO2 100%   BMI 31.93 kg/m  Physical Exam Vitals and nursing note reviewed.  Constitutional:      General: She is not in acute distress.    Appearance: She is well-developed.  HENT:     Head: Normocephalic and atraumatic.   Eyes:     Conjunctiva/sclera: Conjunctivae normal.  Cardiovascular:     Rate and Rhythm: Normal rate and regular rhythm.     Heart sounds: No murmur heard. Pulmonary:     Effort: Pulmonary effort is normal. No respiratory distress.     Breath sounds: Normal breath sounds.  Abdominal:     Palpations: Abdomen is soft.     Tenderness: There is abdominal tenderness. There is no guarding.  Musculoskeletal:        General: No swelling.     Cervical back: Neck supple.  Skin:    General: Skin is warm and dry.     Capillary Refill: Capillary refill takes less than 2 seconds.  Neurological:     Mental Status: She is alert.  Psychiatric:        Mood and Affect: Mood normal.      ED Course/ Medical Decision Making/ A&P    Procedures Procedures   Medications Ordered in ED Medications  ondansetron (ZOFRAN) injection 4 mg (4 mg Intravenous Given 08/14/23 1755)  morphine (PF) 4 MG/ML injection 6 mg (6 mg Intravenous Given 08/14/23 1759)  sodium chloride 0.9 % bolus 1,000 mL (0 mLs Intravenous Stopped 08/14/23 1925)  iohexol (OMNIPAQUE) 300 MG/ML solution 100 mL (100 mLs Intravenous Contrast Given 08/14/23 2040)   Medical Decision Making:   Rebecca Day is a 37 y.o. female who presented to the ED today with abdominal pain,  detailed above.    Patient placed on continuous vitals and telemetry monitoring while in ED which was reviewed periodically.  Complete initial physical exam performed, notably the patient  was HDS in NAD .     Reviewed and confirmed nursing documentation for past medical history, family history, social history.    Initial Assessment:   With the patient's presentation of abdominal pain, most likely diagnosis is nonspecific etiology. Other diagnoses were considered including (but not limited to) gastroenteritis, colitis, small bowel obstruction, appendicitis, cholecystitis, pancreatitis, nephrolithiasis, UTI, pyleonephritis. These are considered less likely due to  history of present illness and physical exam findings.   This is most consistent with an acute life/limb threatening illness complicated by underlying chronic conditions.   Initial Plan:  CBC/CMP to evaluate for underlying infectious/metabolic etiology for patient's abdominal pain  Lipase to evaluate for pancreatitis  CTAB/Pelvis with contrast to evaluate for structural/surgical etiology of patients' severe abdominal pain.  Pregnancy Test Urinalysis and repeat physical assessment to evaluate for UTI/Pyelonpehritis  Empiric management of symptoms with escalating pain control and antiemetics as needed.   Initial Study Results:   Laboratory  All laboratory results reviewed without evidence of clinically relevant pathology.    Radiology All images reviewed independently. Agree with radiology report at this time.   CT ABDOMEN PELVIS W CONTRAST Result Date: 08/14/2023 CLINICAL DATA:  Left lower quadrant abdominal pain. EXAM: CT ABDOMEN AND PELVIS WITH CONTRAST TECHNIQUE: Multidetector CT imaging of the abdomen and pelvis was performed using the standard protocol following bolus administration of intravenous contrast. RADIATION DOSE REDUCTION: This exam was performed according to the departmental dose-optimization program which includes automated exposure control, adjustment of the mA and/or kV according to patient size and/or use of iterative reconstruction technique. CONTRAST:  OMNIPAQUE IOHEXOL 300 MG/ML  SOLN COMPARISON:  08/03/2023 FINDINGS: Lower chest: No acute abnormality. Hepatobiliary: Focal fat deposition along the falciform ligament. Cholecystectomy. No biliary dilation. Pancreas: Unremarkable. Spleen: Unremarkable. Adrenals/Urinary Tract: Normal adrenal glands. No urinary calculi or hydronephrosis. Bladder is unremarkable. Stomach/Bowel: Normal caliber large and small bowel. No bowel wall thickening. Enteric contrast is present in the distal small bowel. The appendix is normal.  Postoperative change of Roux-en-Y gastric bypass. Vascular/Lymphatic: No significant vascular findings are present. No enlarged abdominal or pelvic lymph nodes. Reproductive: Unremarkable. Other: No free intraperitoneal fluid or air. Musculoskeletal: No acute fracture. IMPRESSION: No acute abnormality in the abdomen or pelvis. Electronically Signed   By: Minerva Fester M.D.   On: 08/14/2023 20:54   CT ABDOMEN PELVIS W CONTRAST Result Date: 08/03/2023 CLINICAL DATA:  Right upper quadrant pain, history of gastric ulcer with gastric sleeve EXAM: CT ABDOMEN AND PELVIS WITH CONTRAST TECHNIQUE: Multidetector CT imaging of the abdomen and pelvis was performed using the standard protocol following bolus administration of intravenous contrast. RADIATION DOSE REDUCTION: This exam was performed according to the departmental dose-optimization program which includes automated exposure control, adjustment of the mA and/or kV according to patient size and/or use of iterative reconstruction technique. CONTRAST:  OMNIPAQUE IOHEXOL 300 MG/ML  SOLN COMPARISON:  CT AP, 03/13/2019 and 02/23/2023. FINDINGS: Lower chest: No acute abnormality. Hepatobiliary: No focal liver abnormality. cholecystectomy. No biliary dilatation. Pancreas: No pancreatic ductal dilatation or surrounding inflammatory changes. Spleen: Normal in size without focal abnormality. Adrenals/Urinary Tract: Adrenal glands are unremarkable. Kidneys are normal, without renal calculi, focal lesion, or hydronephrosis. Bladder is unremarkable. Stomach/Bowel: Postsurgical changes of Roux-en-Y gastric bypass with intact-appearing anastomoses. Excluded stomach is within normal limits. Appendix appears normal. No  evidence of bowel wall thickening, distention, or inflammatory changes. Vascular/Lymphatic: Partial mesenteric swirling at the LEFT central abdomen. No evidence of vascular compromise to bowel. No enlarged abdominal or pelvic lymph nodes. Reproductive: Uterus  and adnexa are unremarkable.  Labial adornment. Other: No abdominal wall hernia or abnormality. No abdominopelvic ascites. Musculoskeletal: No acute or significant osseous findings. IMPRESSION: 1. No acute abdominopelvic findings. 2. Postsurgical changes of gastric bypass without CT evidence of acute complication. Additional incidental, chronic and senescent findings as above. Electronically Signed   By: Roanna Banning M.D.   On: 08/03/2023 11:55   DG Chest Port 1 View Result Date: 08/03/2023 CLINICAL DATA:  Abdominal pain radiating to the chest EXAM: PORTABLE CHEST 1 VIEW COMPARISON:  Chest radiograph dated 05/05/2022 FINDINGS: Normal lung volumes. No focal consolidations. No pleural effusion or pneumothorax. The heart size and mediastinal contours are within normal limits. No acute osseous abnormality. IMPRESSION: No active disease. Electronically Signed   By: Agustin Cree M.D.   On: 08/03/2023 08:26    Final Reassessment and Plan:   Patient resting comfortably on serial assessments.  I believe patient stable to follow-up with her surgeon within 48 hours of scheduled otherwise stable for outpatient care and management no acute distress on serial reassessments.  Disposition:  I have considered need for hospitalization, however, considering all of the above, I believe this patient is stable for discharge at this time.  Patient/family educated about specific return precautions for given chief complaint and symptoms.  Patient/family educated about follow-up with PCP.     Patient/family expressed understanding of return precautions and need for follow-up. Patient spoken to regarding all imaging and laboratory results and appropriate follow up for these results. All education provided in verbal form with additional information in written form. Time was allowed for answering of patient questions. Patient discharged.    Emergency Department Medication Summary:   Medications  ondansetron [facility]) injection 4  mg (4 mg Intravenous Given 08/14/23 1755)  morphine (PF) 4 MG/ML injection 6 mg (6 mg Intravenous Given 08/14/23 1759)  sodium chloride 0.9 % bolus 1,000 mL (0 mLs Intravenous Stopped 08/14/23 1925)  iohexol (OMNIPAQUE) 300 MG/ML solution 100 mL (100 mLs Intravenous Contrast Given 08/14/23 2040)             Clinical Impression:  1. Abdominal pain, unspecified abdominal location      Discharge   Final Clinical Impression(s) / ED Diagnoses Final diagnoses:  Abdominal pain, unspecified abdominal location    Rx / DC Orders ED Discharge Orders     None         Glyn Ade, MD 08/14/23 2111

## 2023-08-14 NOTE — ED Notes (Signed)

## 2023-08-14 NOTE — ED Notes (Signed)
Patient transported to CT 

## 2023-08-22 ENCOUNTER — Ambulatory Visit: Payer: No Typology Code available for payment source | Admitting: Nurse Practitioner

## 2023-09-08 ENCOUNTER — Other Ambulatory Visit: Payer: Self-pay | Admitting: Family Medicine

## 2023-10-11 NOTE — Discharge Summary (Signed)
-------------------------------------------------------------------------------   Attestation signed by Lynwood Lien, MD at 10/11/23 1043 Seen and examined by me and agree with assessment and plan.   -------------------------------------------------------------------------------  [facility]   Patient: Rebecca Day MRN: [id number]   Admission date: 10/10/2023 Discharge date : 10/11/2023 POD# 1. This is length of stay.     Assessment:  Morbid Obesity s/p bariatric surgery Principal Problem:   Bile reflux esophagitis     Plan:  Patient is stable enough for discharge. Discharge instructions given and post operative instructions have been gone over with the patient with surgical nurse navigator. Follow up 1 week in office for staple removal.    Subjective:  Rebecca Day is a 38 y.o.female who is status post open ROUX-EN-Y GASTRIC BYPASS, open REPAIR OF INTERNAL HERNIA. No acute events overnight. Patient mentions no nausea and no vomiting. Patient is not complaining of abdominal pain.   Patient is tolerating clears. she has been up and ambulating without difficulty. she has not received bariatric persistant nausea vomiting protocol medications in the past 12 hrs.   Lovenox  BID for 28 days per Dr Lien. She will monitor her glucose carefully with her Dexcom due to her hx. She is aware that she should have apple juice on hand at home. I also recommended isopure in her gatorade to help prevent low glucose levels.   Case and plan discussed with Dr Lien. I spent a total of 32 minutes on discharge planning.  Physical Exam Vitals and nursing note reviewed.  Constitutional:      General: She is not in acute distress.    Appearance: She is obese. She is not ill-appearing, toxic-appearing or diaphoretic.  HENT:     Head: Normocephalic.     Right Ear: External ear normal.     Left Ear: External ear normal.     Nose: Nose normal. No congestion.   Cardiovascular:     Comments: No cyanosis  Pulmonary:     Effort: Pulmonary effort is normal.     Comments: Equal and symmetrical chest rise and fall  Abdominal:     General: There is no distension.     Comments: Incisions approximated with staples. No signs of infection or drainage.   Musculoskeletal:        General: Normal range of motion.     Cervical back: Normal range of motion.  Skin:    General: Skin is dry.  Neurological:     Mental Status: She is alert and oriented to person, place, and time.  Psychiatric:        Mood and Affect: Mood normal.        Behavior: Behavior normal.        Thought Content: Thought content normal.        Judgment: Judgment normal.         Objective:   Blood pressure 133/80, pulse 68, temperature 97.6 F (36.4 C), temperature source Oral, resp. rate 17, height 5' 8 (1.727 m), weight 209 lb 9.6 oz (95.1 kg), SpO2 98%.  Intake/Output Summary (Last 24 hours) at 10/11/2023 0905 Last data filed at 10/11/2023 0653 Gross per 24 hour  Intake 2953.5 ml  Output 15 ml  Net 2938.5 ml   NAD. Abdomen: soft, nontender, nondistended, incisions with staples: clean dry intact, without drainage. Ext: BLE soft calf with TED/SCDs. Receiving subq anticoagulant.     Electronically signed: Etha DELENA Iba, FNP 10/11/2023 / 9:05 AM

## 2023-11-21 ENCOUNTER — Encounter: Payer: Self-pay | Admitting: Medical Genetics

## 2023-12-02 ENCOUNTER — Other Ambulatory Visit: Payer: Self-pay | Admitting: Family Medicine

## 2023-12-22 ENCOUNTER — Emergency Department (HOSPITAL_BASED_OUTPATIENT_CLINIC_OR_DEPARTMENT_OTHER)

## 2023-12-22 ENCOUNTER — Other Ambulatory Visit: Payer: Self-pay

## 2023-12-22 ENCOUNTER — Emergency Department (HOSPITAL_BASED_OUTPATIENT_CLINIC_OR_DEPARTMENT_OTHER)
Admission: EM | Admit: 2023-12-22 | Discharge: 2023-12-22 | Disposition: A | Discharge status: Home / Self Care | Attending: Emergency Medicine | Admitting: Emergency Medicine

## 2023-12-22 ENCOUNTER — Encounter (HOSPITAL_BASED_OUTPATIENT_CLINIC_OR_DEPARTMENT_OTHER): Payer: Self-pay | Admitting: Emergency Medicine

## 2023-12-22 DIAGNOSIS — R519 Headache, unspecified: Secondary | ICD-10-CM | POA: Diagnosis present

## 2023-12-22 DIAGNOSIS — G43009 Migraine without aura, not intractable, without status migrainosus: Secondary | ICD-10-CM | POA: Insufficient documentation

## 2023-12-22 LAB — BASIC METABOLIC PANEL WITH GFR
Anion gap: 11 (ref 5–15)
BUN: 7 mg/dL (ref 6–20)
CO2: 23 mmol/L (ref 22–32)
Calcium: 9.6 mg/dL (ref 8.9–10.3)
Chloride: 105 mmol/L (ref 98–111)
Creatinine, Ser: 0.81 mg/dL (ref 0.44–1.00)
GFR, Estimated: >60 mL/min (ref >60)
Glucose, Bld: 80 mg/dL (ref 70–99)
Potassium: 4.3 mmol/L (ref 3.5–5.1)
Sodium: 139 mmol/L (ref 135–145)

## 2023-12-22 LAB — CBC WITH DIFFERENTIAL/PLATELET
Abs Immature Granulocytes: 0.01 10*3/uL (ref 0.00–0.07)
Basophils Absolute: 0 10*3/uL (ref 0.0–0.1)
Basophils Relative: 1 %
Eosinophils Absolute: 0.1 10*3/uL (ref 0.0–0.5)
Eosinophils Relative: 1 %
HCT: 45 % (ref 36.0–46.0)
Hemoglobin: 15.3 g/dL — ABNORMAL HIGH (ref 12.0–15.0)
Immature Granulocytes: 0 %
Lymphocytes Relative: 44 %
Lymphs Abs: 3 10*3/uL (ref 0.7–4.0)
MCH: 30.9 pg (ref 26.0–34.0)
MCHC: 34 g/dL (ref 30.0–36.0)
MCV: 90.9 fL (ref 80.0–100.0)
Monocytes Absolute: 0.3 10*3/uL (ref 0.1–1.0)
Monocytes Relative: 5 %
Neutro Abs: 3.3 10*3/uL (ref 1.7–7.7)
Neutrophils Relative %: 49 %
Platelets: 284 10*3/uL (ref 150–400)
RBC: 4.95 MIL/uL (ref 3.87–5.11)
RDW: 11.9 % (ref 11.5–15.5)
WBC: 6.7 10*3/uL (ref 4.0–10.5)
nRBC: 0 % (ref 0.0–0.2)

## 2023-12-22 LAB — PREGNANCY, URINE: Preg Test, Ur: NEGATIVE

## 2023-12-22 MED ORDER — DIPHENHYDRAMINE HCL 50 MG/ML IJ SOLN
12.5000 mg | Freq: Once | INTRAMUSCULAR | Status: AC
  Administered 2023-12-22: 12.5 mg via INTRAVENOUS
  Filled 2023-12-22: qty 1

## 2023-12-22 MED ORDER — PROCHLORPERAZINE EDISYLATE 10 MG/2ML IJ SOLN
10.0000 mg | Freq: Once | INTRAMUSCULAR | Status: AC
  Administered 2023-12-22: 10 mg via INTRAVENOUS
  Filled 2023-12-22: qty 2

## 2023-12-22 MED ORDER — ACETAMINOPHEN 500 MG PO TABS
1000.0000 mg | ORAL_TABLET | Freq: Once | ORAL | Status: AC
  Administered 2023-12-22: 1000 mg via ORAL
  Filled 2023-12-22: qty 2

## 2023-12-22 MED ORDER — LACTATED RINGERS IV BOLUS
1000.0000 mL | Freq: Once | INTRAVENOUS | Status: AC
  Administered 2023-12-22: 1000 mL via INTRAVENOUS

## 2023-12-22 MED ORDER — KETOROLAC TROMETHAMINE 15 MG/ML IJ SOLN
15.0000 mg | Freq: Once | INTRAMUSCULAR | Status: AC
  Administered 2023-12-22: 15 mg via INTRAVENOUS
  Filled 2023-12-22: qty 1

## 2023-12-22 NOTE — ED Provider Notes (Signed)
 [place] [facility] AT [facility] Provider Note   CSN: [id number] Arrival date & time: 12/22/23  1400     History  Chief Complaint  Patient presents with   Headache    Rebecca Day is a 38 y.o. female with no significant past medical history presents with concern for headache in the front of her head ongoing since yesterday.  Feels like a tension band across her forehead.  Also associated with some noise sensitivity and nausea.  Also reports some brain fog and difficulty concentrating.  Denies any sudden onset, fevers, or neck pain.  Denies any head trauma.  States she does not get headaches frequently.  Headache   Headache      Home Medications Prior to Admission medications   Medication Sig Start Date End Date Taking? Authorizing Provider  megestrol  (MEGACE ) 40 MG tablet TAKE 2 TABLETS BY MOUTH TWICE A DAY 12/05/23   Stinson, Jacob J, DO  metoCLOPramide  (REGLAN ) 10 MG tablet Take 10 mg by mouth 3 (three) times daily before meals.    [provider]  omeprazole  (PRILOSEC) 20 MG capsule Take 1 capsule (20 mg total) by mouth daily. 12/28/22   Moore, [name], FNP  ondansetron  (ZOFRAN ) 4 MG tablet Take 1 tablet (4 mg total) by mouth every 8 (eight) hours as needed for nausea or vomiting. 03/16/23   Melodie Spry, NP  ondansetron  (ZOFRAN ) 4 MG tablet Take 1 tablet (4 mg total) by mouth every 6 (six) hours. 08/03/23   Denese Finn, PA-C  sucralfate  (CARAFATE ) 1 g tablet Take 1 tablet (1 g total) by mouth 3 (three) times daily before meals. 02/23/23 03/25/23  Melodie Spry, NP      Allergies    Penicillins and Vancomycin     Review of Systems   Review of Systems  Neurological:  Positive for headaches.    Physical Exam Updated Vital Signs BP 129/85 (BP Location: Right Arm)   Pulse 90   Temp 98.5 F (36.9 C)   Resp 16   Ht 5\' 8"  (1.727 m)   Wt 97.5 kg   SpO2 98%   BMI 32.69 kg/m  Physical Exam Vitals and nursing note reviewed.  Constitutional:       General: She is not in acute distress.    Appearance: She is well-developed.     Comments: Sitting in room with lights off, sunglasses on  HENT:     Head: Normocephalic and atraumatic.  Eyes:     Conjunctiva/sclera: Conjunctivae normal.  Neck:     Comments: No nuchal rigidity Cardiovascular:     Rate and Rhythm: Normal rate and regular rhythm.     Heart sounds: No murmur heard. Pulmonary:     Effort: Pulmonary effort is normal. No respiratory distress.     Breath sounds: Normal breath sounds.  Abdominal:     Palpations: Abdomen is soft.     Tenderness: There is no abdominal tenderness.  Musculoskeletal:        General: No swelling.     Cervical back: Neck supple.  Skin:    General: Skin is warm and dry.     Capillary Refill: Capillary refill takes less than 2 seconds.  Neurological:     Mental Status: She is alert.     Comments: Mental status: Alert and oriented to self, place, and month  Speech: Answers questions appropriately  Cranial Nerves: III, IV, VI: EOM intact, Pupils equal round and reactive, no gaze preference or deviation, no nystagmus. V: normal sensation  in V1, V2, and V3 segments bilaterally VII: smiles, puffs cheeks, raises eyebrows, and closes eyes without asymmetry.  VIII: normal hearing to speech IX, X: normal palatal elevation, no uvular deviation XI: 5/5 head turn and 5/5 shoulder shrug bilaterally XII: midline tongue protrusion  Motor: 5/5 strength with resisted wrist flexion and extension bilaterally, ankle plantarflexion and dorsiflexion bilaterally   Sensory: Intact sensation in upper and lower extremity bilaterally   Gait: Normal   Psychiatric:        Mood and Affect: Mood normal.     ED Results / Procedures / Treatments   Labs (all labs ordered are listed, but only abnormal results are displayed) Labs Reviewed  CBC WITH DIFFERENTIAL/PLATELET - Abnormal; Notable for the following components:      Result Value   Hemoglobin 15.3  (*)    All other components within normal limits  PREGNANCY, URINE  BASIC METABOLIC PANEL WITH GFR    EKG None  Radiology CT Head Wo Contrast Result Date: 12/22/2023 CLINICAL DATA:  Headache EXAM: CT HEAD WITHOUT CONTRAST TECHNIQUE: Contiguous axial images were obtained from the base of the skull through the vertex without intravenous contrast. RADIATION DOSE REDUCTION: This exam was performed according to the departmental dose-optimization program which includes automated exposure control, adjustment of the mA and/or kV according to patient size and/or use of iterative reconstruction technique. COMPARISON:  Head CT 04/04/2022. FINDINGS: Brain: No evidence of acute infarction, hemorrhage, hydrocephalus, extra-axial collection or mass lesion/mass effect. Vascular: No hyperdense vessel or unexpected calcification. Skull: Normal. Negative for fracture or focal lesion. Sinuses/Orbits: No acute finding. Other: None. IMPRESSION: No acute intracranial abnormality. Electronically Signed   By: Tyron Gallon M.D.   On: 12/22/2023 18:53    Procedures Procedures    Medications Ordered in ED Medications  ketorolac  (TORADOL ) 15 MG/ML injection 15 mg (has no administration in time range)  prochlorperazine  (COMPAZINE ) injection 10 mg (10 mg Intravenous Given 12/22/23 1718)  diphenhydrAMINE  (BENADRYL ) injection 12.5 mg (12.5 mg Intravenous Given 12/22/23 1717)  acetaminophen  (TYLENOL ) tablet 1,000 mg (1,000 mg Oral Given 12/22/23 1714)  lactated ringers  bolus 1,000 mL (0 mLs Intravenous Stopped 12/22/23 1810)    ED Course/ Medical Decision Making/ A&P                                 Medical Decision Making Amount and/or Complexity of Data Reviewed Labs: ordered. Radiology: ordered.  Risk OTC drugs. Prescription drug management.     Differential diagnosis includes but is not limited to Tension headache, migraine, trigeminal neuralgia, cluster headache, meningitis, concussion, intracranial mass,  intracranial hemorrhage, carbon monoxide poisoning, sinus venous thrombosis   ED Course:  Upon initial evaluation, patient is sitting in a dark room, sunglasses on.  Stable vitals aside from mildly elevated blood pressure of 157/96.  No neurodeficits.  No active vomiting.  She states she does not get headaches frequently, and this headache is not responded to Tylenol  she took earlier today.  Will proceed with CT head imaging given new characteristic of headache.  Labs Ordered: I Ordered, and personally interpreted labs.  The pertinent results include:   CBC with mildly elevated hemoglobin at 15.3.  Otherwise unremarkable BMP within normal limits Pregnancy negative  Imaging Studies ordered: I ordered imaging studies including CT head  I independently visualized the imaging with scope of interpretation limited to determining acute life threatening conditions related to emergency care. Imaging showed no acute abnormality I  agree with the radiologist interpretation   Medications Given: Tylenol , toradol , Compazine , Benadryl , LR bolus given for headache  Upon re-evaluation, patient reports headache improved with medications.  Headache was 8/10 upon arrival, now 2/10. Vitals remain stable. No neck pain/stiffness or fevers to suggest meningitis. CT head unremarkable, no neuro deficits, low concern for other emergent pathology at this time. Suspect migraine. Will give dose of toradol  prior to discharge to hopefully resolve headache, but patient feels comfortable going home at this time.  She does appear somewhat sleepy from the Benadryl , states she will take a Lyft home.    Impression: Migraine, resolved  Disposition:  The patient was discharged home with instructions to take Tylenol  as needed for her headache at home.  Follow-up with PCP if she is having more frequent or severe headaches. Return precautions given.   This chart was dictated using voice recognition software, Dragon. Despite  the best efforts of this provider to proofread and correct errors, errors may still occur which can change documentation meaning.          Final Clinical Impression(s) / ED Diagnoses Final diagnoses:  Migraine without aura and without status migrainosus, not intractable    Rx / DC Orders ED Discharge Orders     None         Rexie Catena, PA-C 12/22/23 Ophelia Billet, MD 12/23/23 1501

## 2023-12-22 NOTE — ED Triage Notes (Signed)
 Headache  Frontal pressure behind eyes Brain fog Started yesterday Sudden intense pain started yesterday morning then worsening pressure No relief with OTC meds Some n/v Photo sensitive

## 2023-12-22 NOTE — Discharge Instructions (Signed)
 Your CT scan was reassuring today, no abnormalities in the brain to explain your migraine.  Your electrolytes, blood counts, and kidney function were normal today.  Your pregnancy test was negative.  You were treated today for your migraine.  You may take up to 1000mg  of tylenol  every 6 hours as needed for pain.  Do not take more then 4g per day.  Please follow-up with your PCP if you are starting to have more frequent or severe headaches not controlled with home medications.  Return to the ER for any uncontrolled headache, vomiting, fevers, any other new or concerning symptoms

## 2023-12-22 NOTE — ED Notes (Signed)
 Patient to CT via stretcher.

## 2023-12-28 ENCOUNTER — Ambulatory Visit (INDEPENDENT_AMBULATORY_CARE_PROVIDER_SITE_OTHER): Admitting: Family Medicine

## 2023-12-28 VITALS — BP 113/80 | HR 97 | Ht 68.0 in | Wt 218.0 lb

## 2023-12-28 DIAGNOSIS — Z1339 Encounter for screening examination for other mental health and behavioral disorders: Secondary | ICD-10-CM | POA: Diagnosis not present

## 2023-12-28 DIAGNOSIS — Z01419 Encounter for gynecological examination (general) (routine) without abnormal findings: Secondary | ICD-10-CM | POA: Diagnosis not present

## 2023-12-28 DIAGNOSIS — Z9884 Bariatric surgery status: Secondary | ICD-10-CM | POA: Diagnosis not present

## 2023-12-28 DIAGNOSIS — F3281 Premenstrual dysphoric disorder: Secondary | ICD-10-CM | POA: Diagnosis not present

## 2023-12-28 MED ORDER — MEGESTROL ACETATE 40 MG PO TABS: 40.0000 mg | ORAL_TABLET | Freq: Every day | ORAL | 3 refills | Status: AC

## 2023-12-28 NOTE — Progress Notes (Signed)
 ANNUAL EXAM Patient name: Rebecca Day MRN [id number]  Date of birth: Dec 09, 1985 Chief Complaint:   Annual Exam  History of Present Illness:   Rebecca Day is a 38 y.o.  [id number]  female  being seen today for a routine annual exam.  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 Due to weight loss and surgeries, has been off megace . Didn't have a period for awhile due to the amount of weight loss she was experiencing, but when her periods returned, so did her PMDD. Restarted her megace , but at 40mg  daily. Has been helpful  Single monogymous relationship.   No LMP recorded (lmp unknown). (Menstrual status: Irregular Periods).    Last pap 11/2021. Results were: NILM w/ HRHPV negative. H/O abnormal pap: no Last mammogram: 10/2022 - normal     12/28/2023    9:29 AM 12/28/2022   12:11 PM 12/28/2022   11:45 AM 07/15/2021   10:44 AM 05/01/2020   11:56 AM  Depression screen PHQ 2/9  Decreased Interest 0 0 0 0 0  Down, Depressed, Hopeless 0 0 0 0 0  PHQ - 2 Score 0 0 0 0 0  Altered sleeping 1 2  0 0  Tired, decreased energy 1 0  0 0  Change in appetite 0 0  0 0  Feeling bad or failure about yourself  0 0  0 0  Trouble concentrating 0 2  0 0  Moving slowly or fidgety/restless 0 1  0 0  Suicidal thoughts 0 0  0 0  PHQ-9 Score 2 5  0 0  Difficult doing work/chores  Somewhat difficult   Not difficult at all        12/28/2023    9:29 AM  GAD 7 : Generalized Anxiety Score  Nervous, Anxious, on Edge 1  Control/stop worrying 0  Worry too much - different things 1  Trouble relaxing 0  Restless 0  Easily annoyed or irritable 1  Afraid - awful might happen 0  Total GAD 7 Score 3     Review of Systems:   Pertinent items are noted in HPI Denies any headaches, blurred vision, fatigue, shortness of breath, chest pain, abdominal pain, abnormal vaginal discharge/itching/odor/irritation, problems with periods, bowel movements,  urination, or intercourse unless otherwise stated above. Pertinent History Reviewed:  Reviewed past medical,surgical, social and family history.  Reviewed problem list, medications and allergies. Physical Assessment:   Vitals:   12/28/23 0924  BP: 113/80  Pulse: 97  Weight: 218 lb (98.9 kg)  Height: 5\' 8"  (1.727 m)  Body mass index is 33.15 kg/m.        Physical Examination:   General appearance - well appearing, and in no distress  Mental status - alert, oriented to person, place, and time  Psych:  She has a normal mood and affect  Skin - warm and dry, normal color, no suspicious lesions noted  Chest - effort normal, all lung fields clear to auscultation bilaterally  Heart - normal rate and regular rhythm  Neck:  midline trachea, no thyromegaly or nodules  Breasts - breasts appear normal, no suspicious masses, no skin or nipple changes or axillary nodes  Abdomen - soft, nontender, nondistended, no masses or organomegaly  Pelvic - VULVA: normal appearing vulva with no masses, tenderness or lesions  VAGINA: normal appearing vagina with normal color and discharge, no lesions  CERVIX: normal appearing cervix without discharge or lesions, no CMT  Thin prep  pap is not done   UTERUS: uterus is felt to be normal size, shape, consistency and nontender   ADNEXA: No adnexal masses or tenderness noted.  Extremities:  No swelling or varicosities noted  Chaperone present for exam  Assessment & Plan:  1. Well woman exam with routine gynecological exam (Primary) Current with screening  2. History of gastric bypass  3. PMDD (premenstrual dysphoric disorder) Continue megace  40mg  daily   Labs/procedures today:   No orders of the defined types were placed in this encounter.   Meds:  Meds ordered this encounter  Medications   megestrol  (MEGACE ) 40 MG tablet    Sig: Take 1 tablet (40 mg total) by mouth daily.    Dispense:  100 tablet    Refill:  3    Follow-up: No follow-ups on  file.  Aloysuis Ribaudo J Fiza Nation, DO 12/28/2023 10:16 AM

## 2023-12-29 ENCOUNTER — Encounter: Payer: No Typology Code available for payment source | Admitting: Nurse Practitioner

## 2024-01-26 ENCOUNTER — Ambulatory Visit: Admitting: Medical Genetics

## 2024-02-10 ENCOUNTER — Ambulatory Visit

## 2024-02-22 ENCOUNTER — Emergency Department (HOSPITAL_BASED_OUTPATIENT_CLINIC_OR_DEPARTMENT_OTHER)

## 2024-02-22 ENCOUNTER — Encounter (HOSPITAL_BASED_OUTPATIENT_CLINIC_OR_DEPARTMENT_OTHER): Payer: Self-pay

## 2024-02-22 ENCOUNTER — Emergency Department (HOSPITAL_BASED_OUTPATIENT_CLINIC_OR_DEPARTMENT_OTHER)
Admission: EM | Admit: 2024-02-22 | Discharge: 2024-02-22 | Disposition: A | Discharge status: Home / Self Care | Attending: Emergency Medicine | Admitting: Emergency Medicine

## 2024-02-22 ENCOUNTER — Other Ambulatory Visit: Payer: Self-pay

## 2024-02-22 DIAGNOSIS — R55 Syncope and collapse: Secondary | ICD-10-CM | POA: Diagnosis present

## 2024-02-22 DIAGNOSIS — T671XXA Heat syncope, initial encounter: Secondary | ICD-10-CM | POA: Insufficient documentation

## 2024-02-22 DIAGNOSIS — G43809 Other migraine, not intractable, without status migrainosus: Secondary | ICD-10-CM | POA: Insufficient documentation

## 2024-02-22 LAB — COMPREHENSIVE METABOLIC PANEL WITH GFR
ALT: 15 U/L (ref 0–44)
AST: 19 U/L (ref 15–41)
Albumin: 4.5 g/dL (ref 3.5–5.0)
Alkaline Phosphatase: 118 U/L (ref 38–126)
Anion gap: 14 (ref 5–15)
BUN: 6 mg/dL (ref 6–20)
CO2: 21 mmol/L — ABNORMAL LOW (ref 22–32)
Calcium: 9.8 mg/dL (ref 8.9–10.3)
Chloride: 106 mmol/L (ref 98–111)
Creatinine, Ser: 0.83 mg/dL (ref 0.44–1.00)
GFR, Estimated: >60 mL/min (ref >60)
Glucose, Bld: 102 mg/dL — ABNORMAL HIGH (ref 70–99)
Potassium: 4.1 mmol/L (ref 3.5–5.1)
Sodium: 141 mmol/L (ref 135–145)
Total Bilirubin: 0.6 mg/dL (ref 0.0–1.2)
Total Protein: 7.6 g/dL (ref 6.5–8.1)

## 2024-02-22 LAB — D-DIMER, QUANTITATIVE: D-Dimer, Quant: 0.5 ug{FEU}/mL (ref 0.00–0.50)

## 2024-02-22 LAB — URINALYSIS, ROUTINE W REFLEX MICROSCOPIC
Bilirubin Urine: NEGATIVE
Glucose, UA: NEGATIVE mg/dL
Ketones, ur: NEGATIVE mg/dL
Leukocytes,Ua: NEGATIVE
Nitrite: NEGATIVE
Protein, ur: NEGATIVE mg/dL
Specific Gravity, Urine: 1.02 (ref 1.005–1.030)
pH: 6 (ref 5.0–8.0)

## 2024-02-22 LAB — CBC
HCT: 41.3 % (ref 36.0–46.0)
Hemoglobin: 14.1 g/dL (ref 12.0–15.0)
MCH: 30.4 pg (ref 26.0–34.0)
MCHC: 34.1 g/dL (ref 30.0–36.0)
MCV: 89 fL (ref 80.0–100.0)
Platelets: 281 10*3/uL (ref 150–400)
RBC: 4.64 MIL/uL (ref 3.87–5.11)
RDW: 11.9 % (ref 11.5–15.5)
WBC: 6.7 10*3/uL (ref 4.0–10.5)
nRBC: 0 % (ref 0.0–0.2)

## 2024-02-22 LAB — PREGNANCY, URINE: Preg Test, Ur: NEGATIVE

## 2024-02-22 LAB — URINALYSIS, MICROSCOPIC (REFLEX)

## 2024-02-22 LAB — CBG MONITORING, ED: Glucose-Capillary: 101 mg/dL — ABNORMAL HIGH (ref 70–99)

## 2024-02-22 MED ORDER — PROCHLORPERAZINE EDISYLATE 10 MG/2ML IJ SOLN
10.0000 mg | Freq: Once | INTRAMUSCULAR | Status: AC
  Administered 2024-02-22: 10 mg via INTRAVENOUS
  Filled 2024-02-22: qty 2

## 2024-02-22 MED ORDER — LACTATED RINGERS IV BOLUS
1000.0000 mL | Freq: Once | INTRAVENOUS | Status: AC
  Administered 2024-02-22: 1000 mL via INTRAVENOUS

## 2024-02-22 MED ORDER — KETOROLAC TROMETHAMINE 15 MG/ML IJ SOLN
15.0000 mg | Freq: Once | INTRAMUSCULAR | Status: AC
  Administered 2024-02-22: 15 mg via INTRAVENOUS
  Filled 2024-02-22: qty 1

## 2024-02-22 MED ORDER — DIPHENHYDRAMINE HCL 50 MG/ML IJ SOLN
12.5000 mg | Freq: Once | INTRAMUSCULAR | Status: AC
  Administered 2024-02-22: 12.5 mg via INTRAVENOUS
  Filled 2024-02-22: qty 1

## 2024-02-22 NOTE — ED Triage Notes (Signed)
 Arrives POV with complaint of having a syncopal episode earlier today. Patient also reports having a bariatric surgery two years ago as well.

## 2024-02-22 NOTE — ED Notes (Signed)
 Portable XR at bedside

## 2024-02-22 NOTE — ED Provider Notes (Signed)
 [place] [facility] Provider Note   CSN: [id number] Arrival date & time: 02/22/24  1121     Patient presents with: Loss of Consciousness   Rebecca Day is a 38 y.o. female.  Patient with past history significant for prior bariatric surgery, possible POTS being worked up by cardiology who presents to the ED today with concerns of syncopal episode.  Patient states that earlier this morning, she was with her sister when she had a syncopal episode after being outside for brief period of time.  Reports that she feels that she is eating and drinking slightly less than she ideally show denies any concerns for significant dehydration.  No reported nausea, vomiting, or diarrhea in the last several days.  Does endorse a mild headache at this time that is consistent with prior migraine headaches.  She tried taking sumatriptan on Monday earlier this week for initial migraine but feels her migraine has not returned.  Also did endorse some brief shortness of breath prior to her syncopal episode that occurred today.  She currently takes Megace .  No prior history of PE or DVT.   Loss of Consciousness      Prior to Admission medications   Medication Sig Start Date End Date Taking? Authorizing Provider  cholestyramine (QUESTRAN) 4 g packet Take 4 g by mouth 3 (three) times daily with meals.    [provider]  megestrol  (MEGACE ) 40 MG tablet Take 1 tablet (40 mg total) by mouth daily. 12/28/23   Stinson, Jacob J, DO  metoCLOPramide  (REGLAN ) 10 MG tablet Take 10 mg by mouth 3 (three) times daily before meals.    [provider]  omeprazole  (PRILOSEC) 20 MG capsule Take 1 capsule (20 mg total) by mouth daily. 12/28/22   Moore, [name], FNP  ondansetron  (ZOFRAN ) 4 MG tablet Take 1 tablet (4 mg total) by mouth every 8 (eight) hours as needed for nausea or vomiting. Patient not taking: Reported on 12/28/2023 03/16/23   Petrina Pries, NP  ondansetron  (ZOFRAN ) 4  MG tablet Take 1 tablet (4 mg total) by mouth every 6 (six) hours. 08/03/23   Victor Lynwood DASEN, PA-C  sucralfate  (CARAFATE ) 1 g tablet Take 1 tablet (1 g total) by mouth 3 (three) times daily before meals. 02/23/23 03/25/23  Petrina Pries, NP  verapamil (VERELAN) 120 MG 24 hr capsule Take 120 mg by mouth at bedtime.    [provider]    Allergies: Penicillins and Vancomycin     Review of Systems  Cardiovascular:  Positive for syncope.  Neurological:  Positive for syncope.  All other systems reviewed and are negative.   Updated Vital Signs BP 116/67   Pulse 69   Temp 99.1 F (37.3 C) (Oral)   Resp (!) 21   Ht 5' 8 (1.727 m)   Wt 97.5 kg   SpO2 100%   BMI 32.69 kg/m   Physical Exam Vitals and nursing note reviewed.  Constitutional:      General: She is not in acute distress.    Appearance: Normal appearance. She is well-developed. She is not ill-appearing.  HENT:     Head: Normocephalic and atraumatic.   Eyes:     Conjunctiva/sclera: Conjunctivae normal.    Cardiovascular:     Rate and Rhythm: Normal rate and regular rhythm.     Heart sounds: No murmur heard. Pulmonary:     Effort: Pulmonary effort is normal. No respiratory distress.     Breath sounds: Normal breath sounds. No  wheezing or rales.  Abdominal:     Palpations: Abdomen is soft.     Tenderness: There is abdominal tenderness.     Comments: Mild tenderness to the left side the patient reports is chronic in nature.   Musculoskeletal:        General: No swelling.     Cervical back: Neck supple.   Skin:    General: Skin is warm and dry.     Capillary Refill: Capillary refill takes less than 2 seconds.     Findings: No lesion.   Neurological:     General: No focal deficit present.     Mental Status: She is alert and oriented to person, place, and time. Mental status is at baseline.     Motor: No weakness.     Comments: Sensitivity to light, no acute focal neurological weakness on either right or  left sides.  Psychiatric:        Mood and Affect: Mood normal.     (all labs ordered are listed, but only abnormal results are displayed) Labs Reviewed  COMPREHENSIVE METABOLIC PANEL WITH GFR - Abnormal; Notable for the following components:      Result Value   CO2 21 (*)    Glucose, Bld 102 (*)    All other components within normal limits  URINALYSIS, ROUTINE W REFLEX MICROSCOPIC - Abnormal; Notable for the following components:   Hgb urine dipstick SMALL (*)    All other components within normal limits  URINALYSIS, MICROSCOPIC (REFLEX) - Abnormal; Notable for the following components:   Bacteria, UA RARE (*)    All other components within normal limits  CBG MONITORING, ED - Abnormal; Notable for the following components:   Glucose-Capillary 101 (*)    All other components within normal limits  CBC  PREGNANCY, URINE  D-DIMER, QUANTITATIVE    EKG: EKG Interpretation Date/Time:  Wednesday February 22 2024 11:40:50 EDT Ventricular Rate:  112 PR Interval:  128 QRS Duration:  76 QT Interval:  325 QTC Calculation: 444 R Axis:   80  Text Interpretation: Sinus tachycardia Consider right atrial enlargement increased rate compared with prior 12/24 Confirmed by Towana Sharper [phone]) on 02/22/2024 11:48:40 AM  Radiology: [facility] Chest Portable 1 View Result Date: 02/22/2024 CLINICAL DATA:  Syncope. EXAM: PORTABLE CHEST 1 VIEW COMPARISON:  X-ray 08/03/2023 FINDINGS: No consolidation, pneumothorax or effusion. No edema. Overlapping cardiac leads. Normal cardiopericardial silhouette. IMPRESSION: No acute cardiopulmonary disease. Electronically Signed   By: Ranell Bring M.D.   On: 02/22/2024 12:52     .Ultrasound ED Peripheral IV (Provider)  Date/Time: 02/22/2024 2:03 PM  Performed by: Cire Deyarmin A, PA-C Authorized by: Katelinn Justice A, PA-C   Procedure details:    Indications: hydration and multiple failed IV attempts     Skin Prep: chlorhexidine  gluconate     Location:  Left AC    Angiocath:  20 G   Bedside Ultrasound Guided: Yes     Images: not archived     Patient tolerated procedure without complications: Yes     Dressing applied: Yes      Medications Ordered in the ED  lactated ringers  bolus 1,000 mL (0 mLs Intravenous Stopped 02/22/24 1510)  prochlorperazine  (COMPAZINE ) injection 10 mg (10 mg Intravenous Given 02/22/24 1403)  diphenhydrAMINE  (BENADRYL ) injection 12.5 mg (12.5 mg Intravenous Given 02/22/24 1403)  ketorolac  (TORADOL ) 15 MG/ML injection 15 mg (15 mg Intravenous Given 02/22/24 1530)  Medical Decision Making Amount and/or Complexity of Data Reviewed Labs: ordered. Radiology: ordered.  Risk Prescription drug management.   This patient presents to the ED for concern of syncope, this involves an extensive number of treatment options, and is a complaint that carries with it a high risk of complications and morbidity.  The differential diagnosis includes vasovagal syncope, dehydration, sepsis, PE, ACS   Co morbidities that complicate the patient evaluation  Prior bariatric surgery, possible POTS   Lab Tests:  I Ordered, and personally interpreted labs.  The pertinent results include: CBC unremarkable, CBG normal at 101, urine pregnancy negative, UA without signs of infection but some hemoglobin seen in similar bacteria but there is 11-20 squamous epithelial cells present indicating likely contamination, CMP with slight decrease in CO2 at 21 and glucose at about 102, D-dimer unremarkable at 0.5   Imaging Studies ordered:  I ordered imaging studies including chest x-ray I independently visualized and interpreted imaging which showed negative for any acute cardiopulmonary findings I agree with the radiologist interpretation   Cardiac Monitoring: / EKG:  The patient was maintained on a cardiac monitor.  I personally viewed and interpreted the cardiac monitored which showed an underlying rhythm of: Sinus  tachycardia   Consultations Obtained:  I requested consultation with none,  and discussed lab and imaging findings as well as pertinent plan - they recommend: N/A   Problem List / ED Course / Critical interventions / Medication management  Patient with past history significant for prior bariatric surgery, possible POTS presents to the ED with concerns of syncopal episode.  She reports she had a syncopal episode earlier today with unknown period of unconsciousness.  She reportedly was with her sister and had just barely been outside for a brief period of time as she was walking around at different stores.  Denies any significant symptoms prior to syncopal episode but does report that she was feeling somewhat dizzy and out of breath prior to the onset of the symptoms.  Reports that she has had the symptoms off and on since her bariatric surgery 2 years ago.  She of note has no history of PE or DVT but is on Megace .  Denies any recent cough, congestion, sore throat.  No recent sick contacts.  She does report some positional possible dehydration or malnutrition as she states with her bariatric surgery, she is not eating or drinking at her baseline level. On exam, patient has left sided abdominal tenderness that she reports is chronic in nature.  No normal heart or lung sounds.  She is resting with her eyes closed due to sensitivity to light.  Pupils are PERRL.  Capillary refill is less than 2 seconds at the fingertips.  Will proceed with workup and add on D-dimer for evaluation of possible PE as patient is not PERC negative.  Initial EKG is unremarkable with only sinus tachycardia noted.  Suspect mild dehydration will add on fluid bolus to attempt to replete fluid level. Evaluation after US  IV was established by myself and fluids and medications initiated, patient reporting significant improvement in symptoms. Medications finished and patient reports headache largely resolved. Tolerated independent  ambulation. Will discharge with instruction to reduce heat exposure and increase fluid intake given possible POTS presentation. Return precautions discussed such as concerns for new or worsening symptoms. Discharged home in stable condition. I ordered medication including fluids, Compazine , Benadryl , Toradol  for dehydration, migraine Reevaluation of the patient after these medicines showed that the patient improved I have reviewed the patients  home medicines and have made adjustments as needed   Social Determinants of Health:  Possible POTS workup ongoing   Test / Admission - Considered:  Admission considered but given improvement in symptoms likely secondary to mild dehydration, patient stable for discharge.  Final diagnoses:  Heat syncope, initial encounter  Other migraine without status migrainosus, not intractable    ED Discharge Orders     None          Cecily Legrand LABOR, PA-C 02/22/24 1640    Towana Ozell BROCKS, MD 02/22/24 1737

## 2024-02-22 NOTE — Discharge Instructions (Signed)
 You are seen in the emergency department today for concerns of a syncopal episode.  Your labs and imaging were thankfully reassuring without any concerning findings seen.  You did appear to respond well to fluids and a migraine cocktail.  I suspect your symptoms are due to mild dehydration given the extreme temperatures outside.  Try to focus on hydration over the next several days to avoid recurrence of her symptoms.  For any concerns of new or worsening symptoms, return to the emergency department.

## 2024-02-22 NOTE — ED Notes (Signed)
 Difficult IV stick. Unable to successfully get an IV on this pt. Provider made aware and will attempt ultrasound guided IV.

## 2024-03-20 NOTE — ED Provider Notes (Signed)
 [facility]  ED Provider Note  Rebecca Day 38 y.o. female DOB: August 26, 1986 MRN: [id number] History   Chief Complaint  Patient presents with  . Abdominal Pain    Left side abd pain radiating to back x 1 week with N/V.   37 year old female with prior medical history as detailed below presents for evaluation.  Patient complains of persistent left upper quadrant abdominal pain for the last week.  She describes a burning discomfort.  She is tearful and distraught during examination.  She denies vomiting.  She reports a mild nausea.  She denies fever.  She denies lower abdominal pain.  She denies vaginal discharge or bleeding.  She denies urinary symptoms or bowel movement change.   History provided by:  Patient and medical records      Past Medical History:  Diagnosis Date  . Acid reflux   . Anxiety    history of- no meds now  . Asthma (*)   . Gallbladder sludge   . History of transfusion 2013  . Hypoglycemia   . Left leg DVT (*) 2010   AFTER C SECTION  . Lupus   . Missing tooth, acquired    LOWER MOLAR RIGHT  . Pulmonary embolism (*) 2013   AFTER C SECTION  . Septic shock (*) 04/04/2022    Past Surgical History:  Procedure Laterality Date  . 24hr ph study  09/07/2023   non acid reflux  . Cesarean section  2010   2013  . Cholecystectomy  03/2023  . Colonoscopy  2022  . 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 Pilonidal cyst / sinus excision  2006  . Pyloroplasty    . 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 Upper gastrointestinal endoscopy  2023  . Upper gastrointestinal endoscopy  01/2023    Social History   Substance and Sexual Activity  Alcohol Use Yes   Comment: occasionally   Tobacco Use History[1] E-Cigarettes  . Vaping Use Never User   . Start Date    . Cartridges/Day    . Quit Date     Social History    Substance and Sexual Activity  Drug Use No         Allergies[2]  Home Medications   CALCIUM  CARBONATE (TUMS) 500 MG CHEWABLE TABLET    Chew one tablet (500 mg dose) by mouth daily as needed.   CALCIUM  CARBONATE-VITAMIN D (OYST-CAL,OSCAL-500) 500-5 MG-MCG PER TABLET    Take one tablet by mouth daily.   CHOLESTYRAMINE (QUESTRAN) 4 G PACKET    Take one packet (4 g dose) by mouth 2 (two) times daily.   CONTINUOUS GLUCOSE RECEIVER (DEXCOM G7 RECEIVER) DEVI    Use as directed.   CONTINUOUS GLUCOSE SENSOR (DEXCOM G7 SENSOR) MISC    1 each by Does not apply route every 10 days.   GABAPENTIN  (NEURONTIN ) 300 MG CAPSULE    Take one capsule (300 mg dose) by mouth every evening.   GLUCAGON (BAQSIMI TWO PACK) 3 MG/DOSE POWD    1 spray by Nasal route as needed.   MEGESTROL  (MEGACE ) 40 MG TABLET    Take two tablets (80 mg dose) by mouth 2 (two) times daily.   METOCLOPRAMIDE  HCL (REGLAN ) 10 MG TABLET    Take by mouth as needed.   METOPROLOL TARTRATE (LOPRESSOR) 50 MG TABLET    Take one tablet (50 mg dose) by mouth  once for 1 dose. Take 30-60 min prior to your CT scan.   MULTIPLE VITAMIN (MULTIVITAMIN) TABLET    Take two tablets by mouth 2 (two) times daily. As reported by patient   NORETHINDRONE (ORTHO MICRONOR,CAMILA,NORA-BE,ERRIN,JOLIVETTE,HEATHER,SHAROBEL) 0.35 MG TABLET    Take one tablet (0.35 mg dose) by mouth daily.   NORTRIPTYLINE HCL (PAMELOR) 25 MG CAPSULE    Take one capsule (25 mg dose) by mouth at bedtime.   OMEPRAZOLE  (PRILOSEC) 40 MG CAPSULE    Take one capsule (40 mg dose) by mouth daily. Open the capsule and swallow the contents with water  or applesauce.   ONDANSETRON  (ZOFRAN -ODT) 4 MG DISINTEGRATING TABLET    Take one tablet (4 mg dose) by mouth every 8 (eight) hours as needed for Nausea.   PROBIOTIC PRODUCT (PROBIOTIC GUMMIES PO)    Take by mouth 2 (two) times daily.   PROCHLORPERAZINE  (COMPAZINE ) 10 MG TABLET    Take one tablet (10 mg dose) by mouth 2 (two) times a day as needed.    SUCRALFATE  (CARAFATE ) 1 G TABLET    Take one tablet (1 g dose) by mouth 2 (two) times a day as needed.   SUMATRIPTAN SUCCINATE (IMITREX) 50 MG TABLET    Take one tablet (50 mg dose) by mouth every 2 (two) hours as needed for Migraine for up to 2 doses.   VERAPAMIL (CALAN-SR) 240 MG ER TABLET    Take one tablet (240 mg dose) by mouth daily.    Primary Survey   Exposure     No visible trauma noted on back exam.      Review of Systems   Review of Systems  All other systems reviewed and are negative.   Physical Exam   ED Triage Vitals [03/20/24 1921]  BP 136/85  Heart Rate 74  Resp 17  SpO2 98 %  Temp 98.8 F (37.1 C)    Physical Exam  Nursing note and vitals reviewed. Constitutional: She appears well-developed and well-nourished.  HENT:  Head: Normocephalic and atraumatic.  Eyes: EOM are intact. Pupils are equal, round, and reactive to light.  Neck: Normal range of motion. Neck supple.  Cardiovascular: Normal rate and regular rhythm.  Pulmonary/Chest: Respiratory effort normal and breath sounds normal.  Abdominal: Soft. There is abdominal tenderness in the left upper quadrant. Bowel sounds are normal.  Musculoskeletal: Normal range of motion. No visible trauma noted on back exam.     Cervical back: Normal range of motion and neck supple.   Neurological: She is alert and oriented to person, place, and time.  Skin: Skin is warm.     ED Course   Lab results:   COMPREHENSIVE METABOLIC PANEL - Abnormal      Result Value   Na 142     Potassium 3.8     Cl 109 (*)    CO2 24     AGAP 9     Glucose 87     BUN 7     Creatinine 0.76     Ca 9.5     ALK PHOS 111     T Bili 0.6     Total Protein 6.8     Alb 4.2     GLOBULIN 2.6     ALBUMIN /GLOBULIN RATIO 1.6     BUN/CREAT RATIO 9.2 (*)    ALT 15     AST 15     eGFR 105     Comment: Normal GFR (glomerular filtration rate) > 60 mL/min/1.73 meters squared, <  60 may include impaired kidney function. Calculation  based on the Chronic Kidney Disease Epidemiology Collaboration (CK-EPI)equation refit without adjustment for race.  URINALYSIS W/MICRO REFLEX CULTURE - SYMPTOMATIC - Abnormal   Urine Color Yellow     Urine Clarity Clear     Urine Specific Gravity 1.032 (*)    Urine pH 6.5     Urine Protein - Dipstick 10 (*)    Urine Glucose Negative     Urine Ketones Negative     Urine Bilirubin Negative     Urine Blood Negative     Urine Nitrite Negative     Urine Urobilinogen 6 (*)    Urine Leukocyte Esterase Negative     UA Microscopic No Micro     Narrative:    Does not meet criteria for reflex to Urine Culture.  LIPASE - Normal   Lipase 16    HUMAN CHORIONIC GONADOTROPIN  (HCG), BETA-SUBUNIT, QUALITATIVE - Normal   Beta HCG Qual Negative    CBC AND DIFFERENTIAL   WBC 5.6     RBC 4.14     HGB 12.6     HCT 37.4     MCV 90.3     MCH 30.4     MCHC 33.7     Plt Ct 224     RDW SD 39.0     MPV 9.8     NRBC% 0.0     Absolute NRBC Count 0.00     NEUTROPHIL % 34.0     LYMPHOCYTE % 58.9     MONOCYTE % 4.8     Eosinophil % 1.8     BASOPHIL % 0.5     IG% 0.0     ABSOLUTE NEUTROPHIL COUNT 1.92     ABSOLUTE LYMPHOCYTE COUNT 3.32     Absolute Monocyte Count 0.27     Absolute Eosinophil Count 0.10     Absolute Basophil Count 0.03     Absolute Immature Granulocyte Count 0.00    LIGHT BLUE TOP  GOLD SST    Imaging: No data to display    ECG: ECG Results          ECG 12 lead (In process)  Result time 03/20/24 19:42:43    In process             Narrative:   Diagnosis Class Abnormal Acquisition Device D3K Ventricular Rate 65 Atrial Rate 65 P-R Interval 150 QRS Duration 84 Q-T Interval 410 QTC Calculation(Bazett) 426 Calculated P Axis 39 Calculated R Axis 69 Calculated T Axis 59  Diagnosis Normal sinus rhythm Nonspecific T wave abnormality Abnormal ECG When compared with ECG of 13-Feb-2022 15:54, No significant change was found                                                                                        Pre-Sedation Procedures    Medical Decision Making Patient with left upper quadrant abdominal pain.  Prior medical history is significant for history of bariatric surgery.  Fortunately, screening labs obtained are without significant acute abnormality.  CT imaging of the abdomen/ pelvis is also without acute abnormality.  After treatment patient feels much improved.  She was offered additional observation and/or admission.  She declines.  She has a plan to follow-up with her GI providers and bariatric surgery providers.  Strict return precautions given and understood.  Importance of close follow-up is stressed.  Amount and/or Complexity of Data Reviewed Labs: ordered. Radiology: ordered. ECG/medicine tests: ordered.  Risk OTC drugs. Prescription drug management.            Provider Communication  New Prescriptions   No medications on file    Modified Medications   No medications on file    Discontinued Medications   No medications on file    Clinical Impression Final diagnoses:  Abdominal pain, unspecified abdominal location    ED Disposition     ED Disposition  Discharge   Condition  Stable   Comment  --                   Electronically signed by:       [1] Social History Tobacco Use  Smoking Status Never  . Passive exposure: Never  Smokeless Tobacco Never  [2] Allergies Allergen Reactions  . Penicillins Unknown  . Vancomycin  Other    Other Reaction(s): Red Man Syndrome (ALLERGY)   Maude Johnetta Galloway, MD 03/21/24 [phone]

## 2024-03-27 NOTE — ED Provider Notes (Signed)
 [facility]  ED Provider Note  Cathalina Barcia Nifong 38 y.o. female DOB: July 24, 1986 MRN: [id number]  Tele-Medical screening initiated and orders placed by DOROTHA JAYSON Simpers, PA. 03/27/2024 / 9:06 PM  38 y.o. 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  Pain reminiscent of pain w/ omental hernia that was discovered and repaired during Roux-en-Y.  BP 155/99, afebrile, alert, appears uncomfortable, no distress  Patient seen and received a tele-medical screening examination in triage.  The provider performing the medical screening exam was not located at the facility and was located remotely.  Patient understands that the provider is seeing them remotely and consents to the exam.  Appropriate orders have been initiated based on my brief physical exam and HPI. Patient placed in appropriate area until a treatment room becomes available for further evaluation and management by the in-house provider.  This tele-medical screening exam was electronically signed by DOROTHA JAYSON Simpers, PA on 03/27/2024 at 9:06 PM  History   Chief Complaint  Patient presents with  . Abdominal Pain    Pt with left sided abdominal pain with n/v - seen recently for same, pain is worse    See MSE.  38 year old female presents for evaluation of left upper quadrant abdominal pain.  Has been hurting a little for a while, but pain really started to increase over the past 2 weeks.  Left upper quadrant with radiation to the left back.  Pain is worse with leaning forward.  Sometimes can find certain body positions that help.  Taking her antacid medication, Carafate  at home with no relief.  Recently seen in the ED and for similar with unremarkable workup.  Admits to associated nausea and vomiting.  Denies any bowel habit changes, no constipation.  Does not know if had any fevers.  Denies  urinary symptoms.  Reports history of kidney stones and states she has none of these symptoms.  History bariatric surgery.        Past Medical History:  Diagnosis Date  . Acid reflux   . Anxiety    history of- no meds now  . Asthma (*)   . Gallbladder sludge   . History of transfusion 2013  . Hypoglycemia   . Left leg DVT (*) 2010   AFTER C SECTION  . Lupus   . Missing tooth, acquired    LOWER MOLAR RIGHT  . Pulmonary embolism (*) 2013   AFTER C SECTION  . Septic shock (*) 04/04/2022    Past Surgical History:  Procedure Laterality Date  . 24hr ph study  09/07/2023   non acid reflux  . Cesarean section  2010   2013  . Cholecystectomy  03/2023  . Colonoscopy  2022  . 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 Pilonidal cyst / sinus excision  2006  . Pyloroplasty    . 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 Upper gastrointestinal endoscopy  2023  . Upper gastrointestinal endoscopy  01/2023    Social History   Substance and Sexual Activity  Alcohol Use Yes   Comment: occasionally   Tobacco Use History[1] E-Cigarettes  . Vaping Use Never User   . Start Date    . Cartridges/Day    . Quit Date     Social History  Substance and Sexual Activity  Drug Use No         Allergies[2]  Home Medications   CALCIUM  CARBONATE (TUMS) 500 MG CHEWABLE TABLET    Chew one tablet (500 mg dose) by mouth daily as needed.   CALCIUM  CARBONATE-VITAMIN D (OYST-CAL,OSCAL-500) 500-5 MG-MCG PER TABLET    Take one tablet by mouth daily.   CHOLESTYRAMINE (QUESTRAN) 4 G PACKET    Take one packet (4 g dose) by mouth 2 (two) times daily.   CONTINUOUS GLUCOSE RECEIVER (DEXCOM G7 RECEIVER) DEVI    Use as directed.   CONTINUOUS GLUCOSE SENSOR (DEXCOM G7 SENSOR) MISC    1 each by Does not apply route every 10 days.   GABAPENTIN  (NEURONTIN ) 300 MG CAPSULE    Take one capsule (300 mg  dose) by mouth every evening.   GLUCAGON (BAQSIMI TWO PACK) 3 MG/DOSE POWD    1 spray by Nasal route as needed.   MEGESTROL  (MEGACE ) 40 MG TABLET    Take two tablets (80 mg dose) by mouth 2 (two) times daily.   METOCLOPRAMIDE  HCL (REGLAN ) 10 MG TABLET    Take by mouth as needed.   METOPROLOL TARTRATE (LOPRESSOR) 50 MG TABLET    Take one tablet (50 mg dose) by mouth once for 1 dose. Take 30-60 min prior to your CT scan.   MULTIPLE VITAMIN (MULTIVITAMIN) TABLET    Take two tablets by mouth 2 (two) times daily. As reported by patient   NORETHINDRONE (ORTHO MICRONOR,CAMILA,NORA-BE,ERRIN,JOLIVETTE,HEATHER,SHAROBEL) 0.35 MG TABLET    Take one tablet (0.35 mg dose) by mouth daily.   NORTRIPTYLINE HCL (PAMELOR) 25 MG CAPSULE    Take one capsule (25 mg dose) by mouth at bedtime.   OMEPRAZOLE  (PRILOSEC) 40 MG CAPSULE    Take one capsule (40 mg dose) by mouth daily. Open the capsule and swallow the contents with water  or applesauce.   ONDANSETRON  (ZOFRAN -ODT) 4 MG DISINTEGRATING TABLET    Take one tablet (4 mg dose) by mouth every 8 (eight) hours as needed for Nausea.   PROBIOTIC PRODUCT (PROBIOTIC GUMMIES PO)    Take by mouth 2 (two) times daily.   PROCHLORPERAZINE  (COMPAZINE ) 10 MG TABLET    Take one tablet (10 mg dose) by mouth 2 (two) times a day as needed.   SUCRALFATE  (CARAFATE ) 1 G TABLET    Take one tablet (1 g dose) by mouth 2 (two) times a day as needed.   SUMATRIPTAN SUCCINATE (IMITREX) 50 MG TABLET    Take one tablet (50 mg dose) by mouth every 2 (two) hours as needed for Migraine for up to 2 doses.   VERAPAMIL (CALAN-SR) 240 MG ER TABLET    Take one tablet (240 mg dose) by mouth daily.    Primary Survey  Primary Survey  Review of Systems   Review of Systems  Gastrointestinal:  Positive for abdominal pain, nausea and vomiting.  All other systems reviewed and are negative.   Physical Exam   ED Triage Vitals [03/27/24 2022]  BP (!) 155/99  Heart Rate 66  Resp 18  SpO2 100 %  Temp  98.7 F (37.1 C)    Physical Exam  Vitals reviewed. Constitutional: She appears well-developed and well-nourished. She is in good hygiene. She has good hygiene. She appears to be in pain, does not appear distressed, does not appear ill and no respiratory distress. Not diaphoretic. Patient leaned forward on her knees trying to control pain  HENT:  Head: Normocephalic and atraumatic.  Mouth/Throat: Voice  normal.  Eyes: Conjunctivae are normal.  Neck: Normal range of motion and voice normal. Neck supple.  Cardiovascular: Normal rate, regular rhythm, normal heart sounds and intact distal pulses.  Pulmonary/Chest: No respiratory distress. Not tachypneic. Respiratory effort normal and breath sounds normal.  Abdominal: Soft. There is mild abdominal tenderness in the left upper quadrant. There is no guarding. Abdomen not distended.  Previous surgical scars  Musculoskeletal: Normal range of motion.     Cervical back: Normal range of motion and neck supple.   Neurological: She is alert and oriented to person, place, and time. Moves all extremities equally. She has normal speech.  Skin: Skin is warm. Not diaphoretic. Skin is dry.  Psychiatric: Her behavior is normal.     ED Course   Lab results:   URINALYSIS W/MICRO REFLEX CULTURE - SYMPTOMATIC - Abnormal      Result Value   Urine Color Yellow     Urine Clarity Clear     Urine Specific Gravity 1.033 (*)    Urine pH 6.0     Urine Protein - Dipstick Negative     Urine Glucose Negative     Urine Ketones Negative     Urine Bilirubin Negative     Urine Blood 0.1 (*)    Urine Nitrite Negative     Urine Urobilinogen 3 (*)    Urine Leukocyte Esterase Negative     Urine Squamous Epithelial Cells 5-10 (*)    Urine WBC 0-2     Urine RBC 3-4 (*)    Urine Bacteria Trace     Urine Mucous 1+ (*)    Urine Granular Casts 0-2 (*)    UA Microscopic Yes Micro (*)    Narrative:    Does not meet criteria for reflex to Urine Culture.   COMPREHENSIVE METABOLIC PANEL - Normal   Na 141     Potassium 4.1     Cl 108     CO2 22     AGAP 11     Glucose 86     BUN 9     Creatinine 0.76     Ca 9.7     ALK PHOS 123     T Bili 0.5     Total Protein 7.5     Alb 4.4     GLOBULIN 3.1     ALBUMIN /GLOBULIN RATIO 1.4     BUN/CREAT RATIO 11.8     ALT 24     AST 19     eGFR 105     Comment: Normal GFR (glomerular filtration rate) > 60 mL/min/1.73 meters squared, < 60 may include impaired kidney function. Calculation based on the Chronic Kidney Disease Epidemiology Collaboration (CK-EPI)equation refit without adjustment for race.  HUMAN CHORIONIC GONADOTROPIN  (HCG), BETA-SUBUNIT, QUALITATIVE - Normal   Beta HCG Qual Negative    LIPASE - Normal   Lipase 20    CBC AND DIFFERENTIAL   WBC 6.3     RBC 4.66     HGB 14.1     HCT 41.8     MCV 89.7     MCH 30.3     MCHC 33.7     Plt Ct 252     RDW SD 38.9     MPV 9.9     NRBC% 0.0     Absolute NRBC Count 0.00     NEUTROPHIL % 36.9     LYMPHOCYTE % 54.9     MONOCYTE % 6.0     Eosinophil %  1.4     BASOPHIL % 0.6     IG% 0.2     ABSOLUTE NEUTROPHIL COUNT 2.34     ABSOLUTE LYMPHOCYTE COUNT 3.48     Absolute Monocyte Count 0.38     Absolute Eosinophil Count 0.09     Absolute Basophil Count 0.04     Absolute Immature Granulocyte Count 0.01    LIGHT BLUE TOP  GOLD SST    Imaging:   CT ABDOMEN W IV CONTRAST AND PO CONTRAST   Narrative:    CT ABDOMEN AND PELVIS WITH INTRAVENOUS CONTRAST: 03/28/2024 1:34 AM  HISTORY: 38 years  old Female with Other Left upper quadrant pain with vomiting.  COMPARISON: 03/20/2024  TECHNIQUE: Axial contiguous images were obtained from the lung bases to the proximal femurs with intravenous intravenous contrast administered. Oral contrast was given to the patient. Sagittal and coronal reconstructions were also obtained and reviewed. This exam was performed according to our departmental dose-optimization program, which includes automated  exposure control, adjustment of the mA and/or KV according to the patient's size and/or use of iterative reconstruction technique.  CONTRAST: 80 mL  Isovue-370,   FINDINGS:    LUNG BASES: No focal consolidative airspace opacities are seen.  No discrete pleural effusions are seen.   LIVER: The visualized hepatic parenchyma demonstrates no focal abnormality.  The main portal vein is well opacified with contrast.  GALLBLADDER: The gallbladder is likely surgically absent..   SPLEEN: The spleen is normal in size.  PANCREAS: The pancreas is unremarkable.  ADRENAL GLANDS: The bilateral adrenal glands are unremarkable.  KIDNEYS: Both kidneys demonstrate no hydronephrosis. There are punctate bilateral nonobstructive renal calculi present.  PELVIS: The urinary bladder is mildly distended.  STOMACH: The stomach is not well distended. There are post surgical changes from gastrojejunostomy noted.  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 There is oral contrast seen within the visualized portions of the stomach and proximal small bowel. These demonstrate some mild distention without evidence for gross transition point. There is some wall thickening at portions of the distal jejunum. This can be seen with subtle enteritis. No pericolonic inflammatory stranding is seen. There are multiple colonic diverticula without evidence to suggest acute diverticulitis. There is a moderate amount of stool seen throughout the colon.  APPENDIX: There is a tubular structure at the right lower quadrant which may represent a normal appendix.  PERITONEUM: There is no evidence of pneumoperitoneum or free fluid.  VESSELS: The IVC is unremarkable. The abdominal aorta is within normal limits of size.  LYMPH NODES: No significantly enlarged lymph nodes are seen in the abdomen or pelvis.  BONES AND SOFT TISSUES: No acute osseous abnormality is seen. Multilevel degenerative changes are seen at the  lumbar spine.    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 There is oral contrast seen within the visualized portions of the stomach and proximal small bowel. These demonstrate some mild distention without evidence for gross transition point. There is some wall thickening at portions of the distal jejunum. This can be seen with subtle enteritis.  Limited, nonobstructive bilateral renal calculi.  Electronically Signed by: Rock Croft, MD on 03/28/2024 1:47 AM      ECG: ECG Results          ECG 12 lead (In process)  Result time 03/28/24 00:49:42    In process             Narrative:   Diagnosis Class Abnormal Acquisition Device D3K Systolic  BP 141 Diastolic BP 89 Ventricular Rate 66 Atrial Rate 66 P-R Interval 136 QRS Duration 80 Q-T Interval 400 QTC Calculation(Bazett) 419 Calculated P Axis 52 Calculated R Axis 71 Calculated T Axis 62  Diagnosis Normal sinus rhythm Nonspecific T wave abnormality Abnormal ECG When compared with ECG of 20-Mar-2024 19:31, No significant change was found                                                                                       Pre-Sedation Procedures    Medical Decision Making Differential includes but is not limited to GERD, gastritis, esophagitis, bowel obstruction, pancreatitis, acute cholecystitis.  Patient with left upper quadrant pain, history of bariatric surgery.  Associated nausea and vomiting.  Was seen recently in the ED for similar.  Had CT of the abdomen pelvis with oral and IV contrast 7/22 with no acute findings.  Briefly discussed with attending Dr. Georgina who agrees with repeat imaging today given continued symptoms and tenderness.  Ordered IV Protonix  and Zofran  for nausea.  Labs are reassuring.  No leukocytosis.  Lipase within normal limits.  Urinalysis looks dehydrated with elevated specific gravity also possibly  contaminated with squamous cells.  No definite signs of acute UTI.  EKG is normal sinus rhythm without definite acute ischemic findings.  Less likely cardiac related today with reproducible abdominal tenderness.  CT of the abdomen pelvis with oral and IV contrast shows postsurgical changes.  No evidence of gross transition point.  Some wall thickening at portions of the distal jejunum which can be seen with subtle enteritis per radiology.  Limited nonobstructive bilateral renal calculi.  Possible symptoms are related to enteritis.  Patient was initially ordered IV Protonix  and Zofran  without much relief in pain, was given a dose of morphine  prior to imaging resulting and reporting good improvement.  She has since tolerated p.o. Upon reassessment, looks more comfortable and states feels well to discharge home.  With her overall reassuring exam and workup, tolerating p.o., safe for discharge home with close outpatient follow-up with her gastroenterologist and bariatric surgeon whom she was instructed to call.  She will continue her omeprazole  and Carafate .  Encouraged adequate fluids and Tylenol  as needed for pain.  Patient states she has antiemetics already at home to use as needed.  Strict return precautions to the ED for any worsening or concerning symptoms were discussed.  Patient demonstrates understanding and happy and agreeable to the plan.  All questions were answered she was discharged home in stable condition.  Amount and/or Complexity of Data Reviewed Labs: ordered. Radiology: ordered. ECG/medicine tests: ordered.  Risk Prescription drug management.            Provider Communication  New Prescriptions   No medications on file    Modified Medications   No medications on file    Discontinued Medications   No medications on file    Clinical Impression Final diagnoses:  Abdominal pain, unspecified abdominal location  Enteritis    ED Disposition     ED Disposition   Discharge   Condition  Stable   Comment  --  Follow-up Information     Lynwood Lien, MD.   Specialties: Bariatric Surgery , General Surgery Comments: LUQ pain, hx bariatric surgery Contact information: [address] [facility] [address] [place] [address] [phone]         Go to  Carola Mace, PA-C.   Specialties: Family Medicine, Occupational Medicine, Physician Assistant Comments: As needed, If symptoms worsen Contact information: [address] [place] [address] [place] [address] [phone]         Carola Mace, PA-C.   Specialties: Family Medicine, Occupational Medicine, Physician Assistant Contact information: [address] [place] [address] [phone]                  Electronically signed by:        [1] Social History Tobacco Use  Smoking Status Never  . Passive exposure: Never  Smokeless Tobacco Never  [2] Allergies Allergen Reactions  . Penicillins Unknown  . Vancomycin  Other    Other Reaction(s): Red Man Syndrome (ALLERGY)   Ozell KATHEE Birmingham, PA-C 03/28/24 [phone]

## 2024-04-22 ENCOUNTER — Other Ambulatory Visit: Payer: Self-pay

## 2024-04-22 ENCOUNTER — Emergency Department (HOSPITAL_BASED_OUTPATIENT_CLINIC_OR_DEPARTMENT_OTHER): Admission: EM | Admit: 2024-04-22 | Discharge: 2024-04-22 | Disposition: A | Discharge status: Home / Self Care

## 2024-04-22 ENCOUNTER — Encounter (HOSPITAL_BASED_OUTPATIENT_CLINIC_OR_DEPARTMENT_OTHER): Payer: Self-pay

## 2024-04-22 DIAGNOSIS — L0292 Furuncle, unspecified: Secondary | ICD-10-CM | POA: Diagnosis present

## 2024-04-22 DIAGNOSIS — E162 Hypoglycemia, unspecified: Secondary | ICD-10-CM | POA: Insufficient documentation

## 2024-04-22 DIAGNOSIS — R112 Nausea with vomiting, unspecified: Secondary | ICD-10-CM

## 2024-04-22 LAB — CBC WITH DIFFERENTIAL/PLATELET
Abs Immature Granulocytes: 0 K/uL (ref 0.00–0.07)
Basophils Absolute: 0 K/uL (ref 0.0–0.1)
Basophils Relative: 1 %
Eosinophils Absolute: 0 K/uL (ref 0.0–0.5)
Eosinophils Relative: 1 %
HCT: 39.9 % (ref 36.0–46.0)
Hemoglobin: 13.7 g/dL (ref 12.0–15.0)
Immature Granulocytes: 0 %
Lymphocytes Relative: 42 %
Lymphs Abs: 1.9 K/uL (ref 0.7–4.0)
MCH: 30.8 pg (ref 26.0–34.0)
MCHC: 34.3 g/dL (ref 30.0–36.0)
MCV: 89.7 fL (ref 80.0–100.0)
Monocytes Absolute: 0.3 K/uL (ref 0.1–1.0)
Monocytes Relative: 6 %
Neutro Abs: 2.3 K/uL (ref 1.7–7.7)
Neutrophils Relative %: 50 %
Platelets: 227 K/uL (ref 150–400)
RBC: 4.45 MIL/uL (ref 3.87–5.11)
RDW: 11.9 % (ref 11.5–15.5)
WBC: 4.5 K/uL (ref 4.0–10.5)
nRBC: 0 % (ref 0.0–0.2)

## 2024-04-22 LAB — TSH: TSH: 0.652 u[IU]/mL (ref 0.350–4.500)

## 2024-04-22 LAB — URINALYSIS, ROUTINE W REFLEX MICROSCOPIC
Bilirubin Urine: NEGATIVE
Glucose, UA: 250 mg/dL — AB
Ketones, ur: NEGATIVE mg/dL
Leukocytes,Ua: NEGATIVE
Nitrite: NEGATIVE
Protein, ur: NEGATIVE mg/dL
Specific Gravity, Urine: 1.015 (ref 1.005–1.030)
pH: 7 (ref 5.0–8.0)

## 2024-04-22 LAB — COMPREHENSIVE METABOLIC PANEL WITH GFR
ALT: 24 U/L (ref 0–44)
AST: 22 U/L (ref 15–41)
Albumin: 4.2 g/dL (ref 3.5–5.0)
Alkaline Phosphatase: 108 U/L (ref 38–126)
Anion gap: 11 (ref 5–15)
BUN: <5 mg/dL — ABNORMAL LOW (ref 6–20)
CO2: 23 mmol/L (ref 22–32)
Calcium: 9.3 mg/dL (ref 8.9–10.3)
Chloride: 107 mmol/L (ref 98–111)
Creatinine, Ser: 0.82 mg/dL (ref 0.44–1.00)
GFR, Estimated: >60 mL/min (ref >60)
Glucose, Bld: 83 mg/dL (ref 70–99)
Potassium: 4.1 mmol/L (ref 3.5–5.1)
Sodium: 141 mmol/L (ref 135–145)
Total Bilirubin: 0.7 mg/dL (ref 0.0–1.2)
Total Protein: 7 g/dL (ref 6.5–8.1)

## 2024-04-22 LAB — LIPASE, BLOOD: Lipase: 15 U/L (ref 11–51)

## 2024-04-22 LAB — URINALYSIS, MICROSCOPIC (REFLEX)
Bacteria, UA: NONE SEEN
Squamous Epithelial / HPF: NONE SEEN /HPF (ref 0–5)
WBC, UA: NONE SEEN WBC/hpf (ref 0–5)

## 2024-04-22 LAB — CBG MONITORING, ED
Glucose-Capillary: 60 mg/dL — ABNORMAL LOW (ref 70–99)
Glucose-Capillary: 150 mg/dL — ABNORMAL HIGH (ref 70–99)

## 2024-04-22 LAB — HCG, SERUM, QUALITATIVE: Preg, Serum: NEGATIVE

## 2024-04-22 MED ORDER — DEXTROSE 50 % IV SOLN
INTRAVENOUS | Status: AC
  Filled 2024-04-22: qty 50

## 2024-04-22 MED ORDER — DEXTROSE 50 % IV SOLN
50.0000 mL | Freq: Once | INTRAVENOUS | Status: AC
  Administered 2024-04-22: 50 mL via INTRAVENOUS

## 2024-04-22 MED ORDER — ONDANSETRON HCL 4 MG/2ML IJ SOLN
4.0000 mg | Freq: Once | INTRAMUSCULAR | Status: AC
  Administered 2024-04-22: 4 mg via INTRAVENOUS
  Filled 2024-04-22: qty 2

## 2024-04-22 NOTE — ED Triage Notes (Signed)
 Pt states that she had a cyst on her butt and it ruptured so she thought everything was okay but now she is feeling weak, fatigued, fever. Fever broke this am with Ibuprofen . Pt attempted to drink water  and then vomited. Hx of cysts.

## 2024-04-22 NOTE — Discharge Instructions (Addendum)
 I do not think there is any, indication to start her on antibiotics at this point in time.  The small area looks like it is already drained.  There is no evidence of any kind of irritation around the site.  You have no labs that be concerning for infection.  Therefore, I do feel comfortable discharging you.  Please continue the Zofran .  If you are not able to tolerate oral food or water  going forward then please come back to the ED.  Glucose was a little low this morning so that is why we had to give you some D50.   If you have any worsening abdominal pain especially with fever chills please come back to  the ED immediately

## 2024-04-22 NOTE — ED Notes (Addendum)
 Pt drowsy, but easily arousable during USIV insertion. Showed no response to pain. CBG checked and was 60. Dr. Simon made aware. 50mL D50 ordered and given. Will recheck CBG in 15 minutes.

## 2024-04-22 NOTE — ED Provider Notes (Signed)
 [place] [facility] Provider Note   CSN: [id number] Arrival date & time: 04/22/24  1029     Patient presents with: Cyst   Rebecca Day is a 38 y.o. female.   HPI    Patient presents due to generalized fatigue as well as concern for a cyst under buttocks.  Patient states that over the past couple days, she has not been feeling well.  Endorses generalized fatigue.  Denies documented fevers at home.  Patient states that she noticed a small bump near her rectum that seem to have burst on its own and subsequently is now not draining, purulent material.  She wanted to get this checked.  She feels like her bowel movements been normal for her.  She had 1-2 episodes of emesis.  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  Patient states that she has been mostly on a liquid diet for a long time because of difficulty with vomiting at times.  Patient endorses chronic left-sided abdominal pain.  Feels like nothing is atypical in terms of her abdominal pain at this point time.  Her bowel movements have been normal for her as well.  No chest pain or shortness of breath.   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  Patient was last seen in the ED for abdominal pain in July 2025.  Diagnosed with enteritis.  Negative workup at that time.  Prior to Admission medications   Medication Sig Start Date End Date Taking? Authorizing Provider  cholestyramine (QUESTRAN) 4 g packet Take 4 g by mouth 3 (three) times daily with meals.    [provider]  megestrol  (MEGACE ) 40 MG tablet Take 1 tablet (40 mg total) by mouth daily. 12/28/23   Stinson, Jacob J, DO  metoCLOPramide  (REGLAN ) 10 MG tablet Take 10 mg by mouth 3 (three) times daily before meals.    [provider]  omeprazole  (PRILOSEC) 20 MG capsule Take 1 capsule (20 mg total) by mouth daily. 12/28/22   Georgina Speaks, FNP  ondansetron  (ZOFRAN ) 4 MG tablet Take 1 tablet (4 mg total) by mouth every 8 (eight) hours as needed for nausea or vomiting. Patient not taking: Reported on 12/28/2023 03/16/23   Petrina Pries, NP  ondansetron  (ZOFRAN ) 4 MG tablet Take 1 tablet (4 mg total) by mouth every 6 (six) hours. 08/03/23   Victor Lynwood DASEN, PA-C  sucralfate  (CARAFATE ) 1 g tablet Take 1 tablet (1 g total) by mouth 3 (three) times daily before meals. 02/23/23 03/25/23  Petrina Pries, NP  verapamil (VERELAN) 120 MG 24 hr capsule Take 120 mg by mouth at bedtime.    [provider]    Allergies: Penicillins, Reglan  [metoclopramide ], and Vancomycin     Review of Systems  Constitutional:  Negative for chills and fever.  HENT:  Negative for ear pain and sore throat.   Eyes:  Negative for pain and visual disturbance.  Respiratory:  Negative for cough and shortness of breath.   Cardiovascular:  Negative for chest pain and palpitations.  Gastrointestinal:  Negative for abdominal pain and vomiting.  Genitourinary:  Negative for dysuria and hematuria.  Musculoskeletal:  Negative for arthralgias and back pain.  Skin:  Negative for color change and rash.  Neurological:  Negative for seizures and syncope.  All other systems reviewed  and are negative.   Updated Vital Signs BP 130/88 (BP Location: Right Arm)   Pulse 68   Temp 98.4 F (36.9 C) (Oral)   Resp 18   Ht 5' 8 (1.727 m)   Wt 98.4 kg   SpO2 100%   BMI 32.99 kg/m   Physical Exam Vitals and nursing note reviewed. Exam conducted with a chaperone present.  Constitutional:      General: She is not in acute distress.    Appearance: She is well-developed.  HENT:     Head: Normocephalic and atraumatic.  Eyes:     Conjunctiva/sclera: Conjunctivae normal.  Cardiovascular:     Rate and Rhythm: Normal rate and regular rhythm.     Heart sounds: No  murmur heard. Pulmonary:     Effort: Pulmonary effort is normal. No respiratory distress.     Breath sounds: Normal breath sounds.  Abdominal:     Palpations: Abdomen is soft.     Tenderness: There is no abdominal tenderness.  Genitourinary:  Musculoskeletal:        General: No swelling.     Cervical back: Neck supple.  Skin:    General: Skin is warm and dry.     Capillary Refill: Capillary refill takes less than 2 seconds.  Neurological:     Mental Status: She is alert.  Psychiatric:        Mood and Affect: Mood normal.     (all labs ordered are listed, but only abnormal results are displayed) Labs Reviewed  COMPREHENSIVE METABOLIC PANEL WITH GFR - Abnormal; Notable for the following components:      Result Value   BUN <5 (*)    All other components within normal limits  CBG MONITORING, ED - Abnormal; Notable for the following components:   Glucose-Capillary 60 (*)    All other components within normal limits  CBG MONITORING, ED - Abnormal; Notable for the following components:   Glucose-Capillary 150 (*)    All other components within normal limits  CBC WITH DIFFERENTIAL/PLATELET  LIPASE, BLOOD  HCG, SERUM, QUALITATIVE  URINALYSIS, ROUTINE W REFLEX MICROSCOPIC  TSH    EKG: EKG Interpretation Date/Time:  Sunday April 22 2024 11:58:57 EDT Ventricular Rate:  67 PR Interval:  150 QRS Duration:  73 QT Interval:  395 QTC Calculation: 417 R Axis:   84  Text Interpretation: Sinus rhythm Confirmed by Simon Rea [phone]) on 04/22/2024 12:43:06 PM  Radiology: No results found.   Procedures   Medications Ordered in the ED  dextrose  50 % solution (  Not Given 04/22/24 1206)  ondansetron  (ZOFRAN ) injection 4 mg (4 mg Intravenous Given 04/22/24 1153)  dextrose  50 % solution 50 mL (50 mLs Intravenous Given 04/22/24 1150)                                    Medical Decision Making Amount and/or Complexity of Data Reviewed Labs: ordered.  Risk Prescription drug  management.   Patient presents due to generalized fatigue as well as concern for a cyst under buttocks.  Patient states that over the past couple days, she has not been feeling well.  Endorses generalized fatigue.  Denies documented fevers at home.  Patient states that she noticed a small bump near her rectum that seem to have burst on its own and subsequently is now not draining, purulent material.  She wanted to get this checked.  She feels like her  bowel movements been normal for her.  She had 1-2 episodes of emesis.  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  Patient states that she has been mostly on a liquid diet for a long time because of difficulty with vomiting at times.  Patient endorses chronic left-sided abdominal pain.  Feels like nothing is atypical in terms of her abdominal pain at this point time.  Her bowel movements have been normal for her as well.  No chest pain or shortness of breath.   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  Patient was last seen in the ED for abdominal pain in July 2025.  Diagnosed with enteritis.  Negative workup at that time.    GU exam with Cheron Molt RN in the room the whole time.  She was very minimal furuncle close to the rectum but not actually within the rectum.  External.  No fluctuance.  No fluid pocket.  This is already been drained.  Small amount of bloody discharge from the area when pressing on it but otherwise no fluctuance or concern for any kind of ongoing abscess.  No rectal pain.  Do not think this communicates with the rectum at all.  No construction, fistula.  No indication for CT imaging.    In terms of patient's nausea vomiting, patient had a benign abdomen.  No significant rebound guarding or tenderness.  By my count, patient's had  at least 12 CT scans in the past year alone.  Discussed with the patient.  She is in agreement that she does not think she needs any kind of CT imaging given that her symptoms would not really change.  This is some chronic issues that she is always dealing with.  She states she does have these flares of vomiting at times.  Glucose was 60 so we did give her D50.  After Zofran , patient was able to tolerate both solids and liquids here in the ED without acute episodes of emesis.  She wants to go home which I think is reasonable.  Once again, soft and benign abdomen upon reexamination.  Laboratory workup unremarkable in terms of LFT derangement.  Currently, no concerns for count of ileus obstruction or other internal hernia related to her previous bariatric surgeries.  Explained to the patient if she has any, worsening pain, inability to tolerate p.o., or a fever or chills then come back to the ED     Final diagnoses:  Furuncle  Nausea and vomiting, unspecified vomiting type  Hypoglycemia    ED Discharge Orders     None          Simon Lavonia SAILOR, MD 04/22/24 1436

## 2024-06-14 ENCOUNTER — Other Ambulatory Visit: Payer: Self-pay | Admitting: Medical Genetics

## 2024-06-14 DIAGNOSIS — Z006 Encounter for examination for normal comparison and control in clinical research program: Secondary | ICD-10-CM

## 2024-08-27 ENCOUNTER — Telehealth: Payer: Self-pay

## 2024-08-27 NOTE — Telephone Encounter (Signed)
 Attempted to reach patient concerning colonoscopy recall; unable to speak with patient;  left message and number to the office for patient to call back and schedule appts;

## 2024-09-20 ENCOUNTER — Ambulatory Visit: Admitting: Family Medicine

## 2024-09-25 ENCOUNTER — Emergency Department (HOSPITAL_BASED_OUTPATIENT_CLINIC_OR_DEPARTMENT_OTHER)

## 2024-09-25 ENCOUNTER — Other Ambulatory Visit: Payer: Self-pay

## 2024-09-25 ENCOUNTER — Emergency Department (HOSPITAL_BASED_OUTPATIENT_CLINIC_OR_DEPARTMENT_OTHER)
Admission: EM | Admit: 2024-09-25 | Discharge: 2024-09-26 | Disposition: A | Attending: Emergency Medicine | Admitting: Emergency Medicine

## 2024-09-25 ENCOUNTER — Encounter (HOSPITAL_BASED_OUTPATIENT_CLINIC_OR_DEPARTMENT_OTHER): Payer: Self-pay

## 2024-09-25 DIAGNOSIS — M549 Dorsalgia, unspecified: Secondary | ICD-10-CM | POA: Diagnosis present

## 2024-09-25 DIAGNOSIS — J45909 Unspecified asthma, uncomplicated: Secondary | ICD-10-CM | POA: Insufficient documentation

## 2024-09-25 LAB — URINALYSIS, ROUTINE W REFLEX MICROSCOPIC
Bilirubin Urine: NEGATIVE
Glucose, UA: NEGATIVE mg/dL
Ketones, ur: NEGATIVE mg/dL
Leukocytes,Ua: NEGATIVE
Nitrite: NEGATIVE
Protein, ur: NEGATIVE mg/dL
Specific Gravity, Urine: >=1.03 (ref 1.005–1.030)
pH: 6 (ref 5.0–8.0)

## 2024-09-25 LAB — CBC WITH DIFFERENTIAL/PLATELET
Abs Immature Granulocytes: 0.02 10*3/uL (ref 0.00–0.07)
Basophils Absolute: 0 10*3/uL (ref 0.0–0.1)
Basophils Relative: 1 %
Eosinophils Absolute: 0.1 10*3/uL (ref 0.0–0.5)
Eosinophils Relative: 1 %
HCT: 42.1 % (ref 36.0–46.0)
Hemoglobin: 14.2 g/dL (ref 12.0–15.0)
Immature Granulocytes: 0 %
Lymphocytes Relative: 47 %
Lymphs Abs: 3.6 10*3/uL (ref 0.7–4.0)
MCH: 30.3 pg (ref 26.0–34.0)
MCHC: 33.7 g/dL (ref 30.0–36.0)
MCV: 89.8 fL (ref 80.0–100.0)
Monocytes Absolute: 0.4 10*3/uL (ref 0.1–1.0)
Monocytes Relative: 5 %
Neutro Abs: 3.7 10*3/uL (ref 1.7–7.7)
Neutrophils Relative %: 46 %
Platelets: 266 10*3/uL (ref 150–400)
RBC: 4.69 MIL/uL (ref 3.87–5.11)
RDW: 11.8 % (ref 11.5–15.5)
WBC: 7.8 10*3/uL (ref 4.0–10.5)
nRBC: 0 % (ref 0.0–0.2)

## 2024-09-25 LAB — URINALYSIS, MICROSCOPIC (REFLEX)

## 2024-09-25 LAB — COMPREHENSIVE METABOLIC PANEL WITH GFR
ALT: 21 U/L (ref 0–44)
AST: 19 U/L (ref 15–41)
Albumin: 4.2 g/dL (ref 3.5–5.0)
Alkaline Phosphatase: 109 U/L (ref 38–126)
Anion gap: 9 (ref 5–15)
BUN: 9 mg/dL (ref 6–20)
CO2: 23 mmol/L (ref 22–32)
Calcium: 9.2 mg/dL (ref 8.9–10.3)
Chloride: 107 mmol/L (ref 98–111)
Creatinine, Ser: 0.9 mg/dL (ref 0.44–1.00)
GFR, Estimated: >60 mL/min
Glucose, Bld: 88 mg/dL (ref 70–99)
Potassium: 3.9 mmol/L (ref 3.5–5.1)
Sodium: 139 mmol/L (ref 135–145)
Total Bilirubin: 0.4 mg/dL (ref 0.0–1.2)
Total Protein: 7.2 g/dL (ref 6.5–8.1)

## 2024-09-25 LAB — PREGNANCY, URINE: Preg Test, Ur: NEGATIVE

## 2024-09-25 LAB — LIPASE, BLOOD: Lipase: 23 U/L (ref 11–51)

## 2024-09-25 MED ORDER — ONDANSETRON HCL 4 MG/2ML IJ SOLN
4.0000 mg | Freq: Once | INTRAMUSCULAR | Status: AC
  Administered 2024-09-25: 4 mg via INTRAVENOUS
  Filled 2024-09-25: qty 2

## 2024-09-25 MED ORDER — MORPHINE SULFATE (PF) 4 MG/ML IV SOLN
4.0000 mg | Freq: Once | INTRAVENOUS | Status: AC
  Administered 2024-09-25: 4 mg via INTRAVENOUS
  Filled 2024-09-25: qty 1

## 2024-09-25 MED ORDER — IOHEXOL 300 MG/ML  SOLN
100.0000 mL | Freq: Once | INTRAMUSCULAR | Status: AC | PRN
  Administered 2024-09-25: 100 mL via INTRAVENOUS
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
## 2024-09-25 NOTE — ED Triage Notes (Signed)
 Nontraumatic mid-lower back pain that radiates down left side and towards abdomen.   Says she has recurrent abdominal pain r/t previous surgeries.

## 2024-09-25 NOTE — ED Provider Notes (Signed)
 10:53 PM Assumed care from Dr. Lenor, please see their note for full history, physical and decision making until this point. In brief this is a 39 y.o. year old female who presented to the ED tonight with Back Pain     Here with back pain and likely radiculopathic symptoms so getting abdominal ct as well. Pending completion of workup, reevaluation and disposition.   Discharge instructions, including strict return precautions for new or worsening symptoms, given. Patient and/or family verbalized understanding and agreement with the plan as described.   Labs, studies and imaging reviewed by myself and considered in medical decision making if ordered. Imaging interpreted by radiology.  Labs Reviewed  URINALYSIS, ROUTINE W REFLEX MICROSCOPIC - Abnormal; Notable for the following components:      Result Value   Hgb urine dipstick SMALL (*)    All other components within normal limits  URINALYSIS, MICROSCOPIC (REFLEX) - Abnormal; Notable for the following components:   Bacteria, UA RARE (*)    All other components within normal limits  PREGNANCY, URINE  COMPREHENSIVE METABOLIC PANEL WITH GFR  LIPASE, BLOOD  CBC WITH DIFFERENTIAL/PLATELET    CT ABDOMEN PELVIS W CONTRAST    (Results Pending)  CT L-SPINE NO CHARGE    (Results Pending)    No follow-ups on file.

## 2024-09-26 MED ORDER — GABAPENTIN 100 MG PO CAPS
100.0000 mg | ORAL_CAPSULE | Freq: Once | ORAL | Status: AC
  Administered 2024-09-26: 100 mg via ORAL
  Filled 2024-09-26: qty 1

## 2024-09-26 MED ORDER — METHOCARBAMOL 500 MG PO TABS
500.0000 mg | ORAL_TABLET | Freq: Once | ORAL | Status: AC
  Administered 2024-09-26: 500 mg via ORAL
  Filled 2024-09-26: qty 1

## 2024-09-26 MED ORDER — PREDNISONE 20 MG PO TABS: ORAL_TABLET | ORAL | 0 refills | Status: AC

## 2024-09-26 MED ORDER — METHOCARBAMOL 500 MG PO TABS: 500.0000 mg | ORAL_TABLET | Freq: Three times a day (TID) | ORAL | 0 refills | Status: AC | PRN

## 2024-09-26 MED ORDER — PREDNISONE 50 MG PO TABS
60.0000 mg | ORAL_TABLET | Freq: Once | ORAL | Status: AC
  Administered 2024-09-26: 60 mg via ORAL
  Filled 2024-09-26: qty 1

## 2024-09-26 MED ORDER — GABAPENTIN 100 MG PO CAPS: 100.0000 mg | ORAL_CAPSULE | Freq: Three times a day (TID) | ORAL | 0 refills | Status: AC

## 2024-09-26 NOTE — ED Notes (Signed)

## 2024-10-04 ENCOUNTER — Ambulatory Visit: Admitting: Family Medicine

## 2024-10-04 VITALS — BP 141/88 | HR 92 | Ht 69.0 in | Wt 217.1 lb

## 2024-10-04 DIAGNOSIS — N939 Abnormal uterine and vaginal bleeding, unspecified: Secondary | ICD-10-CM

## 2024-10-04 MED ORDER — NORETHINDRONE ACETATE 5 MG PO TABS: 5.0000 mg | ORAL_TABLET | Freq: Every day | ORAL | 2 refills | Status: AC

## 2024-10-04 NOTE — Progress Notes (Unsigned)
" ° °  Subjective:    Patient ID: Rebecca Day, female    DOB: [birth year], 39 y.o.   MRN: [id number]  HPI  Discussed the use of AI scribe software for clinical note transcription with the patient, who gave verbal consent to proceed.  History of Present Illness Rebecca Day is a 39 year old female who presents for follow-up of abnormal uterine bleeding.  Abnormal uterine bleeding - Intermittent heavy uterine bleeding with clots since December - Bleeding episodes sometimes triggered by physical activity or intercourse - Discontinued Megace  in December (was hospitalized and did not restart after hospitalization) after several years of use for bleeding control - Had some concerns with her endocrinologist that megace  could have been causing hypoglycemia episodes.  - Plans to conceive within the next 12 months - Hot flashes and return of ovulation symptoms since stopping Megace  - Maternal family history of endometriosis - Concerned about possible endometriosis diagnosis - Diagnosed with antiphospholipid syndrome - Awaiting testing for possible factor XIII deficiency due to daughter's diagnosis - Concern for bleeding or clotting risk in the setting of abnormal uterine bleeding and future pregnancy  Premenstrual dysphoric disorder (pmdd) and mood changes - PMDD currently manageable but worsened since discontinuing Megace  - More prominent mood changes since stopping Megace   Post-bariatric surgery  - 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 Differential includes endometriosis and hormonal imbalances. Previous ultrasound showed no significant uterine thickening. Concerns about endometrial pathology and hormonal imbalances due to bariatric surgery and Megace . - Ordered pelvic ultrasound to assess endometrial thickness and rule out structural pathology. - Ordered blood work: thyroid , testosterone, estradiol, LH, FSH, prolactin. - Will not restart megace , start aygestin  until patient wants to conceive   Premenstrual dysphoric disorder PMDD symptoms returned after stopping Megace , improve with menstruation onset.  - Started aygestin   - Discuss PMDD-specific medication (ie. Venlafaxine, or wellbutrin ) if progesterone not tolerated.  Iron deficiency Low ferritin with hair loss. Previous iron infusion attempts failed due to insurance. Recent labs show improved hemoglobin and RBC.  - Monitor ferritin levels, consider iron infusion if levels remain low. - Continue to follow with hematology   Hypoglycemia Post-surgery hypoglycemia with blood sugar in 40s. Endocrinologist concerned about beta cell issues. Hypoglycemia persists despite Megace  cessation, suggesting other factors. - Monitor blood sugar levels, consult endocrinologist for further evaluation.  Antiphospholipid syndrome Estrogen use avoided due to thromboembolic risk. - Continue to avoid estrogen therapy.   "

## 2024-10-07 ENCOUNTER — Ambulatory Visit (HOSPITAL_BASED_OUTPATIENT_CLINIC_OR_DEPARTMENT_OTHER)
